# Patient Record
Sex: Male | Born: 1945
Health system: Southern US, Community
[De-identification: ages and names within clinical notes are randomized; demographics above are authoritative.]

## PROBLEM LIST (undated history)

## (undated) DIAGNOSIS — I10 Essential (primary) hypertension: Secondary | ICD-10-CM

## (undated) DIAGNOSIS — R7611 Nonspecific reaction to tuberculin skin test without active tuberculosis: Secondary | ICD-10-CM

## (undated) DIAGNOSIS — K219 Gastro-esophageal reflux disease without esophagitis: Secondary | ICD-10-CM

## (undated) DIAGNOSIS — G20A1 Parkinson's disease without dyskinesia, without mention of fluctuations: Secondary | ICD-10-CM

## (undated) DIAGNOSIS — G20 Parkinson's disease: Secondary | ICD-10-CM

## (undated) DIAGNOSIS — K759 Inflammatory liver disease, unspecified: Secondary | ICD-10-CM

## (undated) DIAGNOSIS — I639 Cerebral infarction, unspecified: Secondary | ICD-10-CM

## (undated) DIAGNOSIS — E119 Type 2 diabetes mellitus without complications: Secondary | ICD-10-CM

## (undated) DIAGNOSIS — M1990 Unspecified osteoarthritis, unspecified site: Secondary | ICD-10-CM

## (undated) HISTORY — DX: Parkinson's disease: G20

## (undated) HISTORY — DX: Parkinson's disease without dyskinesia, without mention of fluctuations: G20.A1

## (undated) HISTORY — DX: Gastro-esophageal reflux disease without esophagitis: K21.9

## (undated) HISTORY — DX: Cerebral infarction, unspecified: I63.9

## (undated) HISTORY — PX: CHOLECYSTECTOMY: SHX55

## (undated) HISTORY — DX: Inflammatory liver disease, unspecified: K75.9

## (undated) HISTORY — PX: OTHER SURGICAL HISTORY: SHX169

## (undated) HISTORY — PX: HAND SURGERY: SHX662

## (undated) HISTORY — DX: Nonspecific reaction to tuberculin skin test without active tuberculosis: R76.11

---

## 2011-08-10 DIAGNOSIS — I639 Cerebral infarction, unspecified: Secondary | ICD-10-CM

## 2011-08-10 HISTORY — DX: Cerebral infarction, unspecified: I63.9

## 2011-08-10 LAB — HM COLONOSCOPY

## 2014-08-12 ENCOUNTER — Emergency Department (HOSPITAL_COMMUNITY)
Admission: EM | Admit: 2014-08-12 | Discharge: 2014-08-12 | Disposition: A | Discharge status: Home / Self Care | Payer: Medicare Other | Attending: Emergency Medicine | Admitting: Emergency Medicine

## 2014-08-12 ENCOUNTER — Encounter (HOSPITAL_COMMUNITY): Payer: Self-pay | Admitting: *Deleted

## 2014-08-12 DIAGNOSIS — Z79899 Other long term (current) drug therapy: Secondary | ICD-10-CM | POA: Diagnosis not present

## 2014-08-12 DIAGNOSIS — Z87891 Personal history of nicotine dependence: Secondary | ICD-10-CM | POA: Insufficient documentation

## 2014-08-12 DIAGNOSIS — Y9389 Activity, other specified: Secondary | ICD-10-CM | POA: Insufficient documentation

## 2014-08-12 DIAGNOSIS — X58XXXA Exposure to other specified factors, initial encounter: Secondary | ICD-10-CM | POA: Diagnosis not present

## 2014-08-12 DIAGNOSIS — Y9289 Other specified places as the place of occurrence of the external cause: Secondary | ICD-10-CM | POA: Diagnosis not present

## 2014-08-12 DIAGNOSIS — Y998 Other external cause status: Secondary | ICD-10-CM | POA: Insufficient documentation

## 2014-08-12 DIAGNOSIS — M1990 Unspecified osteoarthritis, unspecified site: Secondary | ICD-10-CM | POA: Diagnosis not present

## 2014-08-12 DIAGNOSIS — Z7982 Long term (current) use of aspirin: Secondary | ICD-10-CM | POA: Diagnosis not present

## 2014-08-12 DIAGNOSIS — S20319A Abrasion of unspecified front wall of thorax, initial encounter: Secondary | ICD-10-CM | POA: Insufficient documentation

## 2014-08-12 DIAGNOSIS — I10 Essential (primary) hypertension: Secondary | ICD-10-CM | POA: Insufficient documentation

## 2014-08-12 DIAGNOSIS — L298 Other pruritus: Secondary | ICD-10-CM | POA: Diagnosis present

## 2014-08-12 DIAGNOSIS — T148XXA Other injury of unspecified body region, initial encounter: Secondary | ICD-10-CM

## 2014-08-12 HISTORY — DX: Unspecified osteoarthritis, unspecified site: M19.90

## 2014-08-12 HISTORY — DX: Essential (primary) hypertension: I10

## 2014-08-12 NOTE — ED Notes (Signed)
Pt. Had rotator cuff surgery on Tuesday and Saturday afternoon pt. Shoulder began to itch.

## 2014-08-12 NOTE — ED Notes (Signed)
Pt. Refused wheelchair and left with all belongings 

## 2014-08-12 NOTE — ED Provider Notes (Signed)
CSN: [id number]     Arrival date & time 08/12/14  0327 History   First MD Initiated Contact with Patient 08/12/14 [id number]     Chief Complaint  Patient presents with  . Pruritis     (Consider location/radiation/quality/duration/timing/severity/associated sxs/prior Treatment) The history is provided by the patient.  Itching from sling from arthroscopy of the right shoulder.  Wearing strap too tight per wife and got a blister now refusing to wear it.  No f/c/r.  No cp sob n/v/d.    Past Medical History  Diagnosis Date  . Arthritis   . Hypertension    Past Surgical History  Procedure Laterality Date  . Hand surgery    . Cholecystectomy    . Rotator cuff surgery     History reviewed. No pertinent family history. History  Substance Use Topics  . Smoking status: Former Smoker    Quit date: 08/09/1990  . Smokeless tobacco: Never Used  . Alcohol Use: No    Review of Systems  Constitutional: Negative for fever.  Respiratory: Negative for shortness of breath.   Cardiovascular: Negative for chest pain.  Skin: Negative for pallor and rash.  All other systems reviewed and are negative.     Allergies  Omeprazole and Nsaids  Home Medications   Prior to Admission medications   Medication Sig Start Date End Date Taking? Authorizing Provider  amLODipine (NORVASC) 5 MG tablet Take 5 mg by mouth daily.   Yes Historical Provider, MD  aspirin 81 MG chewable tablet Chew 81 mg by mouth daily.   Yes Historical Provider, MD  atorvastatin (LIPITOR) 80 MG tablet Take 80 mg by mouth daily.   Yes Historical Provider, MD  diazepam (VALIUM) 5 MG tablet Take 5 mg by mouth every 6 (six) hours as needed for muscle spasms.   Yes Historical Provider, MD  enalapril (VASOTEC) 10 MG tablet Take 10 mg by mouth 2 (two) times daily.   Yes Historical Provider, MD  Misc Natural Products (PROSTATE HEALTH PO) Take 1 tablet by mouth daily.   Yes Historical Provider, MD  Multiple Vitamin (MULTIVITAMIN WITH  MINERALS) TABS tablet Take 1 tablet by mouth daily.   Yes Historical Provider, MD  oxyCODONE (OXY IR/ROXICODONE) 5 MG immediate release tablet Take 5 mg by mouth every 4 (four) hours as needed for severe pain.   Yes Historical Provider, MD  oxyCODONE-acetaminophen (PERCOCET/ROXICET) 5-325 MG per tablet Take 1 tablet by mouth every 4 (four) hours as needed for moderate pain or severe pain.   Yes Historical Provider, MD  tamsulosin (FLOMAX) 0.4 MG CAPS capsule Take 0.4 mg by mouth daily.   Yes Historical Provider, MD   BP 135/86 mmHg  Pulse 67  Temp(Src) 98.3 F (36.8 C) (Oral)  Resp 18  Ht 5\' 9"  (1.753 m)  Wt 189 lb (85.73 kg)  BMI 27.90 kg/m2  SpO2 99% Physical Exam  Constitutional: He is oriented to person, place, and time. He appears well-developed and well-nourished. No distress.  HENT:  Head: Normocephalic and atraumatic.  Mouth/Throat: Oropharynx is clear and moist.  Eyes: Conjunctivae are normal. Pupils are equal, round, and reactive to light.  Neck: Normal range of motion. Neck supple.  Cardiovascular: Normal rate, regular rhythm and intact distal pulses.   Pulmonary/Chest: Effort normal. He has no wheezes. He has no rales.  Abdominal: Soft. Bowel sounds are normal. There is no tenderness. There is no rebound and no guarding.  Musculoskeletal: Normal range of motion.  Neurological: He is alert and oriented to  person, place, and time.  Skin: Skin is warm and dry.  Isolated blister to RU chest and abrasion in distribution of sling strap.  No warmth or erythema  Psychiatric: He has a normal mood and affect.    ED Course  Procedures (including critical care time) Labs Review Labs Reviewed - No data to display  Imaging Review No results found.   EKG Interpretation None      MDM   Final diagnoses:  Abrasion    Wound care and counseling.  Follow up orthopedics.  Wound care instructions given    Ulisses Vondrak K Delvis Kau-Rasch, MD 08/12/14 [phone]

## 2015-04-10 DIAGNOSIS — E611 Iron deficiency: Secondary | ICD-10-CM | POA: Insufficient documentation

## 2015-04-10 LAB — LIPID PANEL
Cholesterol: 100 mg/dL (ref 0–200)
HDL: 53 mg/dL (ref 35–70)
LDL Cholesterol: 37 mg/dL
Triglycerides: 48 mg/dL (ref 40–160)

## 2015-04-10 LAB — PSA: PSA: 1.19

## 2015-04-10 LAB — HEMOGLOBIN A1C: HEMOGLOBIN A1C: 6.9 % — AB (ref 4.0–6.0)

## 2015-05-06 ENCOUNTER — Ambulatory Visit (INDEPENDENT_AMBULATORY_CARE_PROVIDER_SITE_OTHER): Payer: Commercial Managed Care - HMO | Admitting: Internal Medicine

## 2015-05-06 ENCOUNTER — Encounter (INDEPENDENT_AMBULATORY_CARE_PROVIDER_SITE_OTHER): Payer: Self-pay

## 2015-05-06 ENCOUNTER — Encounter: Payer: Self-pay | Admitting: Internal Medicine

## 2015-05-06 VITALS — BP 132/68 | HR 77 | Temp 98.7°F | Ht 67.5 in | Wt 191.0 lb

## 2015-05-06 DIAGNOSIS — I10 Essential (primary) hypertension: Secondary | ICD-10-CM | POA: Diagnosis not present

## 2015-05-06 DIAGNOSIS — G4700 Insomnia, unspecified: Secondary | ICD-10-CM

## 2015-05-06 DIAGNOSIS — I639 Cerebral infarction, unspecified: Secondary | ICD-10-CM | POA: Insufficient documentation

## 2015-05-06 DIAGNOSIS — M549 Dorsalgia, unspecified: Secondary | ICD-10-CM

## 2015-05-06 DIAGNOSIS — E119 Type 2 diabetes mellitus without complications: Secondary | ICD-10-CM

## 2015-05-06 DIAGNOSIS — N400 Enlarged prostate without lower urinary tract symptoms: Secondary | ICD-10-CM

## 2015-05-06 DIAGNOSIS — Z8611 Personal history of tuberculosis: Secondary | ICD-10-CM | POA: Insufficient documentation

## 2015-05-06 DIAGNOSIS — H8110 Benign paroxysmal vertigo, unspecified ear: Secondary | ICD-10-CM

## 2015-05-06 DIAGNOSIS — G8929 Other chronic pain: Secondary | ICD-10-CM

## 2015-05-06 DIAGNOSIS — E785 Hyperlipidemia, unspecified: Secondary | ICD-10-CM

## 2015-05-06 DIAGNOSIS — K219 Gastro-esophageal reflux disease without esophagitis: Secondary | ICD-10-CM

## 2015-05-06 MED ORDER — TRAZODONE HCL 50 MG PO TABS: 25.0000 mg | ORAL_TABLET | Freq: Every evening | ORAL | Status: DC | PRN

## 2015-05-06 NOTE — Progress Notes (Signed)
HPI  Pt presents to the clinic today to establish care and for management of the conditions listed below. He is transferring care from the [facility].  HTN: BP well controlled. He takes Norvasc, Enalapril and HCTZ. His BP today is 132/68.He has had an ECG in the past.  Chronic low back pain. This has been going on since his 20's. He has had xrays and MRI's. He was told at one point that he had spine bifida but he reports he does not think he has this. He takes oxycodone for severe pain. He does follow with pain management.  DM 2: His last A1C was 6.9%. He does not take any medication for his diabetes. He reports he has never been told he has diabetes. He does not check his sugars. His last eye exam was 04/2014. Flu shot 04/2015. Pneumovax and Prevnar UTD. He does not check his feet daily.  Stroke: His last LDL was 37. He takes Lipitor daily. He denies myalgias. He does try to consume a low fat diet. He takes a baby ASA daily.  BPH: Voids fine on Flomax.  GERD: Denies breakthrough symptoms on Zantac.  History of TB: as a child, no reoccurrence. He denies fatigue, cough, shortness of breath or night sweats.  BPPV: He still has intermittent dizziness. It is worse with head movements. He does not want to take a medication for this but wants to know if there is any other treatments available.   He also c/o difficulty sleeping. This has been going on for years. It seems to have gotten worse in the last 2 weeks since his mother died. He was not able to attend her funeral. He reports he has difficulty falling asleep but once he is asleep, he can usually stay asleep. He has tried Ambien and Trazadone in the past.  Flu: 04/2015 Tetanus: unsure of exact date but < 10 years ago Prevnar: 2016 Pneumovax: 2015 Zostovax: 2016 PSA Screening: 04/2015 Colon Screening: 2011 Vision Screening: yearly, 04/2014 Dentist: as needed  Past Medical History  Diagnosis Date  . Arthritis   . Hypertension   . Hepatitis    . Stroke 2013  . Positive TB test     as a child    Current Outpatient Prescriptions  Medication Sig Dispense Refill  . amLODipine (NORVASC) 5 MG tablet Take 5 mg by mouth daily.    Marland Kitchen aspirin 81 MG chewable tablet Chew 81 mg by mouth daily.    Marland Kitchen atorvastatin (LIPITOR) 80 MG tablet Take 80 mg by mouth daily.    . enalapril (VASOTEC) 10 MG tablet Take 10 mg by mouth 2 (two) times daily.    . Misc Natural Products (PROSTATE HEALTH PO) Take 1 tablet by mouth daily.    . Multiple Vitamin (MULTIVITAMIN WITH MINERALS) TABS tablet Take 1 tablet by mouth daily.    Marland Kitchen oxyCODONE (OXY IR/ROXICODONE) 5 MG immediate release tablet Take 5 mg by mouth every 4 (four) hours as needed for severe pain.    . tamsulosin (FLOMAX) 0.4 MG CAPS capsule Take 0.4 mg by mouth daily.    . hydrochlorothiazide (HYDRODIURIL) 25 MG tablet Take 1 tablet by mouth daily.    [name] 3-Lutein-Zeaxanthin (ADVANCED EYE HEALTH PO) Take 1 tablet by mouth daily.    . ranitidine (ZANTAC) 150 MG tablet Take 1 tablet by mouth 2 (two) times daily.     No current facility-administered medications for this visit.    Allergies  Allergen Reactions  . Omeprazole Anaphylaxis  .  Nsaids Nausea And Vomiting    Family History  Problem Relation Age of Onset  . Heart disease Mother   . Hypertension Mother     Social History   Social History  . Marital Status: Married    Spouse Name: N/A  . Number of Children: N/A  . Years of Education: N/A   Occupational History  . Not on file.   Social History Main Topics  . Smoking status: Former Smoker    Quit date: 08/09/1990  . Smokeless tobacco: Never Used  . Alcohol Use: No  . Drug Use: No  . Sexual Activity: Yes   Other Topics Concern  . Not on file   Social History Narrative    ROS:  Constitutional: Denies fever, malaise, fatigue, headache or abrupt weight changes.  HEENT: Denies eye pain, eye redness, ear pain, ringing in the ears, wax buildup, runny nose, nasal  congestion, bloody nose, or sore throat. Respiratory: Denies difficulty breathing, shortness of breath, cough or sputum production.   Cardiovascular: Denies chest pain, chest tightness, palpitations or swelling in the hands or feet.  Gastrointestinal: Denies abdominal pain, bloating, constipation, diarrhea or blood in the stool.  GU: Denies frequency, urgency, pain with urination, blood in urine, odor or discharge. Musculoskeletal: Pt reports back pain. Denies  difficulty with gait, muscle pain or joint pain and swelling.  Skin: Denies redness, rashes, lesions or ulcercations.  Neurological: Denies dizziness, difficulty with memory, difficulty with speech or problems with balance and coordination.  Psych: Denies anxiety, depression, SI/HI.  No other specific complaints in a complete review of systems (except as listed in HPI above).  PE:  BP 132/68 mmHg  Pulse 77  Temp(Src) 98.7 F (37.1 C) (Oral)  Ht 5' 7.5" (1.715 m)  Wt 191 lb (86.637 kg)  BMI 29.46 kg/m2  SpO2 98%  Wt Readings from Last 3 Encounters:  05/06/15 191 lb (86.637 kg)  08/12/14 189 lb (85.73 kg)    General: Appears his stated age, well developed, well nourished in NAD. Skin: Warm, dry and intact. HEENT: Head: normal shape and size; Eyes: sclera white, no icterus, conjunctiva pink, PERRLA and EOMs intact;  Neck: Neck supple, trachea midline. No masses, lumps or thyromegaly present.  Cardiovascular: Normal rate and rhythm. S1,S2 noted.  No murmur, rubs or gallops noted. No JVD or BLE edema. No carotid bruits noted. Pulmonary/Chest: Normal effort and positive vesicular breath sounds. No respiratory distress. No wheezes, rales or ronchi noted.  Abdomen: Soft and nontender.  Musculoskeletal: Decreased flexion and extension due to pain. Normal rotation. Pain with palpation over the lumbar spine. No difficulty with gait.  Neurological: Alert and oriented. Psychiatric: Mood and affect normal. Behavior is normal. Judgment  and thought content normal.    Assessment and Plan:

## 2015-05-06 NOTE — Assessment & Plan Note (Signed)
LDL at goal on Lipitor Advised him to consume a low fat diet

## 2015-05-06 NOTE — Assessment & Plan Note (Signed)
Stop Benadryl RX for Trazadone QHS prn

## 2015-05-06 NOTE — Assessment & Plan Note (Signed)
Continue Zantac Avoid trigger foods

## 2015-05-06 NOTE — Assessment & Plan Note (Signed)
Referral placed to vestibular rehab

## 2015-05-06 NOTE — Progress Notes (Signed)
Pre visit review using our clinic review tool, if applicable. No additional management support is needed unless otherwise documented below in the visit note. 

## 2015-05-06 NOTE — Assessment & Plan Note (Signed)
BP well controlled Continue Norvasc, Enalapril and HCTZ Will request ECG from previous PCP

## 2015-05-06 NOTE — Assessment & Plan Note (Signed)
He denies that he has this He declines treatment for this

## 2015-05-06 NOTE — Assessment & Plan Note (Signed)
No intervention needed Will continue to monitor

## 2015-05-06 NOTE — Assessment & Plan Note (Signed)
LDL at goal on Lipitor Continue ASA daily Will continue to monitor

## 2015-05-06 NOTE — Assessment & Plan Note (Signed)
He will continue to follow with pain management  

## 2015-05-06 NOTE — Patient Instructions (Signed)
Insomnia Insomnia is frequent trouble falling and/or staying asleep. Insomnia can be a long term problem or a short term problem. Both are common. Insomnia can be a short term problem when the wakefulness is related to a certain stress or worry. Long term insomnia is often related to ongoing stress during waking hours and/or poor sleeping habits. Overtime, sleep deprivation itself can make the problem worse. Every little thing feels more severe because you are overtired and your ability to cope is decreased. CAUSES   Stress, anxiety, and depression.  Poor sleeping habits.  Distractions such as TV in the bedroom.  Naps close to bedtime.  Engaging in emotionally charged conversations before bed.  Technical reading before sleep.  Alcohol and other sedatives. They may make the problem worse. They can hurt normal sleep patterns and normal dream activity.  Stimulants such as caffeine for several hours prior to bedtime.  Pain syndromes and shortness of breath can cause insomnia.  Exercise late at night.  Changing time zones may cause sleeping problems (jet lag). It is sometimes helpful to have someone observe your sleeping patterns. They should look for periods of not breathing during the night (sleep apnea). They should also look to see how long those periods last. If you live alone or observers are uncertain, you can also be observed at a sleep clinic where your sleep patterns will be professionally monitored. Sleep apnea requires a checkup and treatment. Give your caregivers your medical history. Give your caregivers observations your family has made about your sleep.  SYMPTOMS   Not feeling rested in the morning.  Anxiety and restlessness at bedtime.  Difficulty falling and staying asleep. TREATMENT   Your caregiver may prescribe treatment for an underlying medical disorders. Your caregiver can give advice or help if you are using alcohol or other drugs for self-medication. Treatment  of underlying problems will usually eliminate insomnia problems.  Medications can be prescribed for short time use. They are generally not recommended for lengthy use.  Over-the-counter sleep medicines are not recommended for lengthy use. They can be habit forming.  You can promote easier sleeping by making lifestyle changes such as:  Using relaxation techniques that help with breathing and reduce muscle tension.  Exercising earlier in the day.  Changing your diet and the time of your last meal. No night time snacks.  Establish a regular time to go to bed.  Counseling can help with stressful problems and worry.  Soothing music and white noise may be helpful if there are background noises you cannot remove.  Stop tedious detailed work at least one hour before bedtime. HOME CARE INSTRUCTIONS   Keep a diary. Inform your caregiver about your progress. This includes any medication side effects. See your caregiver regularly. Take note of:  Times when you are asleep.  Times when you are awake during the night.  The quality of your sleep.  How you feel the next day. This information will help your caregiver care for you.  Get out of bed if you are still awake after 15 minutes. Read or do some quiet activity. Keep the lights down. Wait until you feel sleepy and go back to bed.  Keep regular sleeping and waking hours. Avoid naps.  Exercise regularly.  Avoid distractions at bedtime. Distractions include watching television or engaging in any intense or detailed activity like attempting to balance the household checkbook.  Develop a bedtime ritual. Keep a familiar routine of bathing, brushing your teeth, climbing into bed at the same   time each night, listening to soothing music. Routines increase the success of falling to sleep faster.  Use relaxation techniques. This can be using breathing and muscle tension release routines. It can also include visualizing peaceful scenes. You can  also help control troubling or intruding thoughts by keeping your mind occupied with boring or repetitive thoughts like the old concept of counting sheep. You can make it more creative like imagining planting one beautiful flower after another in your backyard garden.  During your day, work to eliminate stress. When this is not possible use some of the previous suggestions to help reduce the anxiety that accompanies stressful situations. MAKE SURE YOU:   Understand these instructions.  Will watch your condition.  Will get help right away if you are not doing well or get worse. Document Released: 07/23/2000 Document Revised: 10/18/2011 Document Reviewed: 08/23/2007 Exit[employer] Patient Information 2015 [employer]. This information is not intended to replace advice given to you by your health care provider. Make sure you discuss any questions you have with your health care provider.  

## 2015-05-06 NOTE — Assessment & Plan Note (Signed)
-   Continue Flomax 

## 2015-05-16 ENCOUNTER — Telehealth: Payer: Self-pay

## 2015-05-16 ENCOUNTER — Telehealth: Payer: Self-pay | Admitting: Internal Medicine

## 2015-05-16 NOTE — Telephone Encounter (Signed)
noted 

## 2015-05-16 NOTE — Telephone Encounter (Signed)
Pt notified as instructed and pt voiced understanding. 

## 2015-05-16 NOTE — Telephone Encounter (Signed)
This is something we order during a physical/wellness. I would recommend [facility] for [facility], and we can have his next colonoscopy (if needed) done somewhere closer.

## 2015-05-16 NOTE — Telephone Encounter (Signed)
Pt seen 05/06/15 and has been taking Trazodone 50 mg at hs and that is working well; advised pt instructions are to take 25 mg; pt said will take 1/2 tab at hs and cb with how sleeping taking 1/2 tab. FYI to Webb Silversmith NP.

## 2015-05-16 NOTE — Telephone Encounter (Signed)
Pt called wanting to see if we could do a colonoscopy referral for him. He does have something set up with the [place] in [facility] on October 25th, but wanted to know if we could get him in somewhere closer. Pt states his last procedure was done with the [place] in [place]. His call back number is [phone]. Thank you.

## 2015-06-03 DIAGNOSIS — Z86010 Personal history of colonic polyps: Secondary | ICD-10-CM | POA: Diagnosis not present

## 2015-06-03 DIAGNOSIS — K5900 Constipation, unspecified: Secondary | ICD-10-CM | POA: Diagnosis not present

## 2015-06-11 ENCOUNTER — Encounter: Payer: Self-pay | Admitting: Internal Medicine

## 2015-06-12 DIAGNOSIS — Z1211 Encounter for screening for malignant neoplasm of colon: Secondary | ICD-10-CM | POA: Diagnosis not present

## 2015-06-12 DIAGNOSIS — D122 Benign neoplasm of ascending colon: Secondary | ICD-10-CM | POA: Diagnosis not present

## 2015-06-12 DIAGNOSIS — D126 Benign neoplasm of colon, unspecified: Secondary | ICD-10-CM | POA: Diagnosis not present

## 2015-06-12 DIAGNOSIS — D123 Benign neoplasm of transverse colon: Secondary | ICD-10-CM | POA: Diagnosis not present

## 2015-06-12 DIAGNOSIS — Z86010 Personal history of colonic polyps: Secondary | ICD-10-CM | POA: Diagnosis not present

## 2015-06-12 LAB — HM COLONOSCOPY

## 2015-06-13 ENCOUNTER — Other Ambulatory Visit: Payer: Self-pay | Admitting: Internal Medicine

## 2015-06-13 MED ORDER — RANITIDINE HCL 150 MG PO TABS: 150.0000 mg | ORAL_TABLET | Freq: Two times a day (BID) | ORAL | Status: DC

## 2015-06-13 MED ORDER — ATORVASTATIN CALCIUM 80 MG PO TABS: 80.0000 mg | ORAL_TABLET | Freq: Every day | ORAL | Status: DC

## 2015-06-13 MED ORDER — TAMSULOSIN HCL 0.4 MG PO CAPS: 0.4000 mg | ORAL_CAPSULE | Freq: Every day | ORAL | Status: DC

## 2015-06-13 NOTE — Telephone Encounter (Signed)
Pt request refill 1 week of med to [facility] while wait on mail order pharmacy delivery for atorvastatin,ranitidine and tamsulosin.advised pt done. Pt last seen 05/06/15.

## 2015-07-02 ENCOUNTER — Other Ambulatory Visit: Payer: Self-pay

## 2015-07-02 MED ORDER — TRAZODONE HCL 50 MG PO TABS: 25.0000 mg | ORAL_TABLET | Freq: Every evening | ORAL | Status: DC | PRN

## 2015-09-19 ENCOUNTER — Encounter: Payer: Self-pay | Admitting: Internal Medicine

## 2015-09-23 ENCOUNTER — Other Ambulatory Visit: Payer: Self-pay | Admitting: *Deleted

## 2015-09-23 MED ORDER — TRAZODONE HCL 50 MG PO TABS: 25.0000 mg | ORAL_TABLET | Freq: Every evening | ORAL | Status: DC | PRN

## 2015-09-23 NOTE — Telephone Encounter (Signed)
Last filled #45 on 07/02/15.  Last OV was to establish care on 05/06/15.  Okay to refill?

## 2015-09-23 NOTE — Telephone Encounter (Signed)
Ok to phone in Trazadone 

## 2015-09-30 ENCOUNTER — Other Ambulatory Visit: Payer: Self-pay | Admitting: Internal Medicine

## 2015-10-08 ENCOUNTER — Telehealth: Payer: Self-pay

## 2015-10-08 NOTE — Telephone Encounter (Signed)
James Pearson stop by today to check on his prescription traZODone (DESYREL) 50 MG tablet, He stated that [place] had not received it. [place] is completely out now. Please check on this for him and let him know. [phone] He also stated that if you wanted to call in to [facility] would be okay with him, because he is having so much trouble with [employer].

## 2015-10-09 MED ORDER — TRAZODONE HCL 50 MG PO TABS: 25.0000 mg | ORAL_TABLET | Freq: Every evening | ORAL | Status: DC | PRN

## 2015-10-09 NOTE — Addendum Note (Signed)
Addended by: Lurlean Nanny on: 10/09/2015 04:51 PM   Modules accepted: Orders

## 2015-10-09 NOTE — Telephone Encounter (Signed)
Rx sent to pharmacy as it was set to phone in previously and pt is aware

## 2015-11-03 ENCOUNTER — Ambulatory Visit: Payer: Commercial Managed Care - HMO | Admitting: Internal Medicine

## 2015-11-06 ENCOUNTER — Ambulatory Visit (INDEPENDENT_AMBULATORY_CARE_PROVIDER_SITE_OTHER): Payer: Commercial Managed Care - HMO | Admitting: Internal Medicine

## 2015-11-06 ENCOUNTER — Encounter: Payer: Self-pay | Admitting: Internal Medicine

## 2015-11-06 ENCOUNTER — Ambulatory Visit: Payer: Commercial Managed Care - HMO | Admitting: Internal Medicine

## 2015-11-06 VITALS — BP 142/80 | HR 81 | Temp 97.5°F | Wt 189.0 lb

## 2015-11-06 DIAGNOSIS — I10 Essential (primary) hypertension: Secondary | ICD-10-CM

## 2015-11-06 DIAGNOSIS — E119 Type 2 diabetes mellitus without complications: Secondary | ICD-10-CM

## 2015-11-06 DIAGNOSIS — K219 Gastro-esophageal reflux disease without esophagitis: Secondary | ICD-10-CM

## 2015-11-06 DIAGNOSIS — E785 Hyperlipidemia, unspecified: Secondary | ICD-10-CM

## 2015-11-06 DIAGNOSIS — G4700 Insomnia, unspecified: Secondary | ICD-10-CM

## 2015-11-06 DIAGNOSIS — N400 Enlarged prostate without lower urinary tract symptoms: Secondary | ICD-10-CM

## 2015-11-06 DIAGNOSIS — I639 Cerebral infarction, unspecified: Secondary | ICD-10-CM | POA: Diagnosis not present

## 2015-11-06 DIAGNOSIS — G8929 Other chronic pain: Secondary | ICD-10-CM

## 2015-11-06 DIAGNOSIS — M549 Dorsalgia, unspecified: Secondary | ICD-10-CM

## 2015-11-06 NOTE — Progress Notes (Signed)
HPI  Pt presents to the clinic today for follow up of chronic conditions:  HTN: BP well controlled. He takes Norvasc, Enalapril and HCTZ. His BP today is 142/80. He thinks it is because he was rushing to get here and was late. He has had an ECG in the past.  Chronic low back pain. He does take Oxycodone as needed for severe pain. He follows with pain management.  DM 2: His last A1C was 6.9%. He does not take any medication for his diabetes. He does not check his sugars. His last eye exam was 04/2015. Flu shot 04/2015. Pneumovax and Prevnar UTD. He does not check his feet daily.  Stroke: His last LDL was 37. He takes Lipitor daily. He denies myalgias. He does try to consume a low fat diet. He takes a baby ASA daily.  BPH: Voids fine on Flomax.  GERD: Denies breakthrough symptoms on Zantac.  Insomnia. He reports he has difficulty falling asleep but once he is asleep, he can usually stay asleep. He is taking 1/2 tablet of Trazadone but feels like he would sleep better if he could take a whole tablet.   Past Medical History  Diagnosis Date  . Arthritis   . Hypertension   . Hepatitis   . Stroke [facility]) 2013  . Positive TB test     as a child    Current Outpatient Prescriptions  Medication Sig Dispense Refill  . amLODipine (NORVASC) 5 MG tablet Take 5 mg by mouth daily.    Marland Kitchen aspirin 81 MG chewable tablet Chew 81 mg by mouth daily.    Marland Kitchen atorvastatin (LIPITOR) 80 MG tablet Take 1 tablet (80 mg total) by mouth daily. 7 tablet 0  . enalapril (VASOTEC) 10 MG tablet Take 10 mg by mouth 2 (two) times daily.    . hydrochlorothiazide (HYDRODIURIL) 25 MG tablet Take 1 tablet by mouth daily.    . Misc Natural Products (PROSTATE HEALTH PO) Take 1 tablet by mouth daily.    . Multiple Vitamin (MULTIVITAMIN WITH MINERALS) TABS tablet Take 1 tablet by mouth daily.    James Pearson 3-Lutein-Zeaxanthin (ADVANCED EYE HEALTH PO) Take 1 tablet by mouth daily.    Marland Kitchen OVER THE COUNTER MEDICATION Take 3 capsules by  mouth daily. Testropin supplement    . oxyCODONE (OXY IR/ROXICODONE) 5 MG immediate release tablet Take 5 mg by mouth every 4 (four) hours as needed for severe pain.    . ranitidine (ZANTAC) 150 MG tablet Take 1 tablet (150 mg total) by mouth 2 (two) times daily. 14 tablet 0  . Saw Palmetto-Pumpkin Seed Oil 160-100 MG CAPS Take 2 capsules by mouth daily.    . sildenafil (VIAGRA) 100 MG tablet Take 50-100 mg by mouth daily as needed for erectile dysfunction.    . tamsulosin (FLOMAX) 0.4 MG CAPS capsule Take 1 capsule (0.4 mg total) by mouth daily. 7 capsule 0  . traZODone (DESYREL) 50 MG tablet Take 0.5 tablets (25 mg total) by mouth at bedtime as needed for sleep. 45 tablet 0   No current facility-administered medications for this visit.    Allergies  Allergen Reactions  . Omeprazole Anaphylaxis  . Nsaids Nausea And Vomiting    Family History  Problem Relation Age of Onset  . Heart disease Mother   . Hypertension Mother     Social History   Social History  . Marital Status: Married    Spouse Name: N/A  . Number of Children: N/A  . Years of Education:  N/A   Occupational History  . Not on file.   Social History Main Topics  . Smoking status: Former Smoker    Quit date: 08/09/1990  . Smokeless tobacco: Never Used  . Alcohol Use: No  . Drug Use: No  . Sexual Activity: Yes   Other Topics Concern  . Not on file   Social History Narrative    ROS:  Constitutional: Denies fever, malaise, fatigue, headache or abrupt weight changes.  HEENT: Denies eye pain, eye redness, ear pain, ringing in the ears, wax buildup, runny nose, nasal congestion, bloody nose, or sore throat. Respiratory: Denies difficulty breathing, shortness of breath, cough or sputum production.   Cardiovascular: Denies chest pain, chest tightness, palpitations or swelling in the hands or feet.  Gastrointestinal: Denies abdominal pain, bloating, constipation, diarrhea or blood in the stool.  GU: Denies  frequency, urgency, pain with urination, blood in urine, odor or discharge. Musculoskeletal: Pt reports back pain. Denies  difficulty with gait, muscle pain or joint pain and swelling.  Skin: Denies redness, rashes, lesions or ulcercations.  Neurological: Denies dizziness, difficulty with memory, difficulty with speech or problems with balance and coordination.  Psych: Denies anxiety, depression, SI/HI.  No other specific complaints in a complete review of systems (except as listed in HPI above).  PE:  BP 142/80 mmHg  Pulse 81  Temp(Src) 97.5 F (36.4 C) (Oral)  Wt 189 lb (85.73 kg)  SpO2 98%  Wt Readings from Last 3 Encounters:  11/06/15 189 lb (85.73 kg)  05/06/15 191 lb (86.637 kg)  08/12/14 189 lb (85.73 kg)    General: Appears his stated age, well developed, well nourished in NAD. Skin: Warm, dry and intact. HEENT: Head: normal shape and size; Eyes: sclera white, no icterus, conjunctiva pink, PERRLA and EOMs intact;  Neck: Neck supple, trachea midline. No masses, lumps or thyromegaly present.  Cardiovascular: Normal rate and rhythm. S1,S2 noted.  No murmur, rubs or gallops noted. No JVD or BLE edema. No carotid bruits noted. Pulmonary/Chest: Normal effort and positive vesicular breath sounds. No respiratory distress. No wheezes, rales or ronchi noted.  Abdomen: Soft and nontender.  Musculoskeletal: Decreased flexion and extension due to pain. Normal rotation. Pain with palpation over the lumbar spine. No difficulty with gait.  Neurological: Alert and oriented. Psychiatric: Mood and affect normal. Behavior is normal. Judgment and thought content normal.    Assessment and Plan:

## 2015-11-06 NOTE — Patient Instructions (Signed)

## 2015-11-07 NOTE — Assessment & Plan Note (Signed)
Will check A1C today No microalbumin due to ACEI therapy Encouraged him to get a yearly eye exam Foot exam today Flu, Pneumovax and Prevnar UTD Encouraged him to consume a low fat, low carb diet

## 2015-11-07 NOTE — Assessment & Plan Note (Signed)
Voids well on Flomax

## 2015-11-07 NOTE — Assessment & Plan Note (Signed)
Controlled on Norvasc, Enalapril, and HCTZ CBC and CMET today

## 2015-11-07 NOTE — Assessment & Plan Note (Signed)
Will check CMET and Lipid Profile today Encouraged him to consume a low fat diet Continue Lipitor and baby ASA daily

## 2015-11-07 NOTE — Assessment & Plan Note (Signed)
Will increase Trazadone to a whole tab

## 2015-11-07 NOTE — Assessment & Plan Note (Signed)
Well controlled on Zantac Discussed diet to avoid triggers

## 2015-11-07 NOTE — Assessment & Plan Note (Signed)
He will continue to follow with pain management  

## 2015-11-07 NOTE — Assessment & Plan Note (Signed)
Will check LDL and CMET today Continue Lipitor and baby ASA daily

## 2015-12-10 ENCOUNTER — Other Ambulatory Visit: Payer: Self-pay

## 2015-12-10 MED ORDER — TRAZODONE HCL 50 MG PO TABS: 50.0000 mg | ORAL_TABLET | Freq: Every evening | ORAL | Status: DC | PRN

## 2016-03-09 LAB — HEMOGLOBIN A1C: Hemoglobin A1C: 6.9

## 2016-05-10 ENCOUNTER — Ambulatory Visit: Payer: Commercial Managed Care - HMO | Admitting: Internal Medicine

## 2016-05-10 DIAGNOSIS — Z0289 Encounter for other administrative examinations: Secondary | ICD-10-CM

## 2016-05-10 NOTE — Progress Notes (Deleted)
HPI  Pt presents to the clinic today for follow up of chronic conditions:  HTN: BP well controlled. He takes Norvasc, Enalapril and HCTZ. His BP today is . There is no ECG on file for review.  Chronic low back pain. He does take Oxycodone as needed for severe pain. He follows with pain management.  DM 2: His last A1C was 7.1%, 09/2015. He does not take any medication for his diabetes. He does not check his sugars. His last eye exam was 04/2015. Flu shot 05/2015. Pneumovax 10/2014. Prevnar.James Pearson He does not check his feet daily.  Stroke: His last LDL was 37. He takes Lipitor daily. He denies myalgias. He does try to consume a low fat diet. He takes a baby ASA daily.  BPH: Voids fine on Flomax.  GERD: Denies breakthrough symptoms on Zantac.  Insomnia. Improved with increasing Trazadone to a whole tab.   Past Medical History:  Diagnosis Date  . Arthritis   . Hepatitis   . Hypertension   . 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 Stroke [facility]) 2013    Current Outpatient Prescriptions  Medication Sig Dispense Refill  . amLODipine (NORVASC) 5 MG tablet Take 5 mg by mouth daily.    James Pearson aspirin 81 MG chewable tablet Chew 81 mg by mouth daily.    James Pearson atorvastatin (LIPITOR) 80 MG tablet Take 1 tablet (80 mg total) by mouth daily. 7 tablet 0  . enalapril (VASOTEC) 10 MG tablet Take 10 mg by mouth 2 (two) times daily.    . hydrochlorothiazide (HYDRODIURIL) 25 MG tablet Take 1 tablet by mouth daily.    . Misc Natural Products (PROSTATE HEALTH PO) Take 1 tablet by mouth daily.    . Multiple Vitamin (MULTIVITAMIN WITH MINERALS) TABS tablet Take 1 tablet by mouth daily.    James Pearson 3-Lutein-Zeaxanthin (ADVANCED EYE HEALTH PO) Take 1 tablet by mouth daily.    James Pearson OVER THE COUNTER MEDICATION Take 3 capsules by mouth daily. Testropin supplement    . oxyCODONE (OXY IR/ROXICODONE) 5 MG immediate release tablet Take 5 mg by mouth every 4 (four) hours as needed for severe pain.    . ranitidine (ZANTAC) 150 MG tablet  Take 1 tablet (150 mg total) by mouth 2 (two) times daily. 14 tablet 0  . Saw Palmetto-Pumpkin Seed Oil 160-100 MG CAPS Take 2 capsules by mouth daily.    . sildenafil (VIAGRA) 100 MG tablet Take 50-100 mg by mouth daily as needed for erectile dysfunction.    . tamsulosin (FLOMAX) 0.4 MG CAPS capsule Take 1 capsule (0.4 mg total) by mouth daily. 7 capsule 0  . traZODone (DESYREL) 50 MG tablet Take 1 tablet (50 mg total) by mouth at bedtime as needed for sleep. 90 tablet 1   No current facility-administered medications for this visit.     Allergies  Allergen Reactions  . Omeprazole Anaphylaxis  . Nsaids Nausea And Vomiting    Family History  Problem Relation Age of Onset  . Heart disease Mother   . Hypertension Mother     Social History   Social History  . Marital status: Married    Spouse name: N/A  . Number of children: N/A  . Years of education: N/A   Occupational History  . Not on file.   Social History Main Topics  . Smoking status: Former Smoker    Quit date: 08/09/1990  . Smokeless tobacco: Never Used  . Alcohol use No  . Drug use: No  .  Sexual activity: Yes   Other Topics Concern  . Not on file   Social History Narrative  . No narrative on file    ROS:  Constitutional: Denies fever, malaise, fatigue, headache or abrupt weight changes.  HEENT: Denies eye pain, eye redness, ear pain, ringing in the ears, wax buildup, runny nose, nasal congestion, bloody nose, or sore throat. Respiratory: Denies difficulty breathing, shortness of breath, cough or sputum production.   Cardiovascular: Denies chest pain, chest tightness, palpitations or swelling in the hands or feet.  Gastrointestinal: Denies abdominal pain, bloating, constipation, diarrhea or blood in the stool.  GU: Denies frequency, urgency, pain with urination, blood in urine, odor or discharge. Musculoskeletal: Pt reports back pain. Denies  difficulty with gait, muscle pain or joint pain and swelling.   Skin: Denies redness, rashes, lesions or ulcercations.  Neurological: Denies dizziness, difficulty with memory, difficulty with speech or problems with balance and coordination.  Psych: Denies anxiety, depression, SI/HI.  No other specific complaints in a complete review of systems (except as listed in HPI above).  PE:  There were no vitals taken for this visit.  Wt Readings from Last 3 Encounters:  11/06/15 189 lb (85.7 kg)  05/06/15 191 lb (86.6 kg)  08/12/14 189 lb (85.7 kg)    General: Appears his stated age, well developed, well nourished in NAD. Skin: Warm, dry and intact. HEENT: Head: normal shape and size; Eyes: sclera white, no icterus, conjunctiva pink, PERRLA and EOMs intact;  Neck: Neck supple, trachea midline. No masses, lumps or thyromegaly present.  Cardiovascular: Normal rate and rhythm. S1,S2 noted.  No murmur, rubs or gallops noted. No JVD or BLE edema. No carotid bruits noted. Pulmonary/Chest: Normal effort and positive vesicular breath sounds. No respiratory distress. No wheezes, rales or ronchi noted.  Abdomen: Soft and nontender.  Musculoskeletal: Decreased flexion and extension due to pain. Normal rotation. Pain with palpation over the lumbar spine. No difficulty with gait.  Neurological: Alert and oriented. Psychiatric: Mood and affect normal. Behavior is normal. Judgment and thought content normal.    Assessment and Plan:

## 2016-05-12 ENCOUNTER — Ambulatory Visit: Payer: Commercial Managed Care - HMO | Admitting: Internal Medicine

## 2016-05-14 ENCOUNTER — Encounter: Payer: Self-pay | Admitting: Internal Medicine

## 2016-05-14 ENCOUNTER — Ambulatory Visit (INDEPENDENT_AMBULATORY_CARE_PROVIDER_SITE_OTHER): Payer: Commercial Managed Care - HMO | Admitting: Internal Medicine

## 2016-05-14 DIAGNOSIS — I10 Essential (primary) hypertension: Secondary | ICD-10-CM | POA: Diagnosis not present

## 2016-05-14 DIAGNOSIS — E119 Type 2 diabetes mellitus without complications: Secondary | ICD-10-CM | POA: Diagnosis not present

## 2016-05-14 DIAGNOSIS — M545 Low back pain: Secondary | ICD-10-CM

## 2016-05-14 DIAGNOSIS — K219 Gastro-esophageal reflux disease without esophagitis: Secondary | ICD-10-CM | POA: Diagnosis not present

## 2016-05-14 DIAGNOSIS — I639 Cerebral infarction, unspecified: Secondary | ICD-10-CM

## 2016-05-14 DIAGNOSIS — G8929 Other chronic pain: Secondary | ICD-10-CM

## 2016-05-14 DIAGNOSIS — E7800 Pure hypercholesterolemia, unspecified: Secondary | ICD-10-CM

## 2016-05-14 DIAGNOSIS — G4700 Insomnia, unspecified: Secondary | ICD-10-CM

## 2016-05-14 DIAGNOSIS — N400 Enlarged prostate without lower urinary tract symptoms: Secondary | ICD-10-CM

## 2016-05-14 NOTE — Progress Notes (Signed)
HPI  Pt presents to the clinic today for follow up of chronic conditions:  HTN: BP well controlled. He takes Norvasc, Enalapril and HCTZ. His BP today is 122/60. He has had an ECG in the past with his previous provider.  Chronic low back pain. He does take Oxycodone as needed for severe pain. He follows with pain management.  DM 2: His last A1C was 7.1%. He does not take any medication for his diabetes. He does not check his sugars. His last eye exam was 10/2015. Flu shot 05/2016. Pneumovax 10/2014.  Prevnar UTD. He does not check his feet daily.  Stroke: He just had his labs checked at the [place], but he did not bring a copy of the results with him. He takes Lipitor and ASA  daily. He denies myalgias. He does try to consume a low fat diet.   BPH: Voids fine on Flomax.  GERD: Denies breakthrough symptoms on Zantac.  Insomnia. He reports he sleeps well but he has to take 1.5 tablets of the Trazadone.   Past Medical History:  Diagnosis Date  . Arthritis   . Hepatitis   . Hypertension   . 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 Stroke [facility]) 2013    Current Outpatient Prescriptions  Medication Sig Dispense Refill  . amLODipine (NORVASC) 5 MG tablet Take 5 mg by mouth daily.    Marland Kitchen aspirin 81 MG chewable tablet Chew 81 mg by mouth daily.    Marland Kitchen atorvastatin (LIPITOR) 80 MG tablet Take 1 tablet (80 mg total) by mouth daily. 7 tablet 0  . enalapril (VASOTEC) 10 MG tablet Take 10 mg by mouth 2 (two) times daily.    . hydrochlorothiazide (HYDRODIURIL) 25 MG tablet Take 1 tablet by mouth daily.    . Misc Natural Products (PROSTATE HEALTH PO) Take 1 tablet by mouth daily.    . Multiple Vitamin (MULTIVITAMIN WITH MINERALS) TABS tablet Take 1 tablet by mouth daily.    [name] 3-Lutein-Zeaxanthin (ADVANCED EYE HEALTH PO) Take 1 tablet by mouth daily.    Marland Kitchen OVER THE COUNTER MEDICATION Take 3 capsules by mouth daily. Testropin supplement    . oxyCODONE (OXY IR/ROXICODONE) 5 MG immediate release tablet Take 5  mg by mouth every 4 (four) hours as needed for severe pain.    . ranitidine (ZANTAC) 150 MG tablet Take 1 tablet (150 mg total) by mouth 2 (two) times daily. 14 tablet 0  . Saw Palmetto-Pumpkin Seed Oil 160-100 MG CAPS Take 2 capsules by mouth daily.    . sildenafil (VIAGRA) 100 MG tablet Take 50-100 mg by mouth daily as needed for erectile dysfunction.    . tamsulosin (FLOMAX) 0.4 MG CAPS capsule Take 1 capsule (0.4 mg total) by mouth daily. 7 capsule 0  . traZODone (DESYREL) 50 MG tablet Take 1 tablet (50 mg total) by mouth at bedtime as needed for sleep. 90 tablet 1   No current facility-administered medications for this visit.     Allergies  Allergen Reactions  . Omeprazole Anaphylaxis  . Nsaids Nausea And Vomiting    Family History  Problem Relation Age of Onset  . Heart disease Mother   . Hypertension Mother     Social History   Social History  . Marital status: Married    Spouse name: N/A  . Number of children: N/A  . Years of education: N/A   Occupational History  . Not on file.   Social History Main Topics  .  Smoking status: Former Smoker    Quit date: 08/09/1990  . Smokeless tobacco: Never Used  . Alcohol use No  . Drug use: No  . Sexual activity: Yes   Other Topics Concern  . Not on file   Social History Narrative  . No narrative on file    ROS:  Constitutional: Denies fever, malaise, fatigue, headache or abrupt weight changes.  HEENT: Denies eye pain, eye redness, ear pain, ringing in the ears, wax buildup, runny nose, nasal congestion, bloody nose, or sore throat. Respiratory: Denies difficulty breathing, shortness of breath, cough or sputum production.   Cardiovascular: Denies chest pain, chest tightness, palpitations or swelling in the hands or feet.  Gastrointestinal: Denies abdominal pain, bloating, constipation, diarrhea or blood in the stool.  GU: Denies frequency, urgency, pain with urination, blood in urine, odor or  discharge. Musculoskeletal: Pt reports back pain. Denies  difficulty with gait, muscle pain or joint pain and swelling.  Skin: Denies redness, rashes, lesions or ulcercations.  Neurological: Denies dizziness, difficulty with memory, difficulty with speech or problems with balance and coordination.  Psych: Denies anxiety, depression, SI/HI.  No other specific complaints in a complete review of systems (except as listed in HPI above).  PE:  BP 122/66 (BP Location: Left Arm, Patient Position: Sitting, Cuff Size: Normal)   Pulse 82   Temp 98.4 F (36.9 C) (Oral)   Wt 194 lb (88 kg)   SpO2 98%   BMI 29.94 kg/m    Wt Readings from Last 3 Encounters:  11/06/15 189 lb (85.7 kg)  05/06/15 191 lb (86.6 kg)  08/12/14 189 lb (85.7 kg)    General: Appears his stated age, well developed, well nourished in NAD. Skin: Warm, dry and intact.  Cardiovascular: Normal rate and rhythm. S1,S2 noted.  No murmur, rubs or gallops noted. No JVD or BLE edema. No carotid bruits noted. Pulmonary/Chest: Normal effort and positive vesicular breath sounds. No respiratory distress. No wheezes, rales or ronchi noted.  Abdomen: Soft and nontender.  Musculoskeletal: Decreased flexion and extension due to pain. Normal rotation. Pain with palpation over the lumbar spine. No difficulty with gait.  Neurological: Alert and oriented. Psychiatric: Mood and affect normal. Behavior is normal. Judgment and thought content normal.    Assessment and Plan:

## 2016-05-16 ENCOUNTER — Encounter: Payer: Self-pay | Admitting: Internal Medicine

## 2016-05-16 MED ORDER — ENALAPRIL MALEATE 10 MG PO TABS: 10.0000 mg | ORAL_TABLET | Freq: Two times a day (BID) | ORAL | 3 refills | Status: DC

## 2016-05-16 NOTE — Assessment & Plan Note (Signed)
No residual effect Continue Lipitor and ASA

## 2016-05-16 NOTE — Assessment & Plan Note (Signed)
Controlled on Norvasc, Enalapril and HCTZ He will bring me a copy of his lab results Will request ECG from a copy of his previous provider

## 2016-05-16 NOTE — Assessment & Plan Note (Signed)
He will continue to follow with pain management Continue Oxycodone

## 2016-05-16 NOTE — Patient Instructions (Signed)

## 2016-05-16 NOTE — Assessment & Plan Note (Signed)
He will bring me a copy of his most recent labs Encouraged him to consume a low fat, low carb diet Foot exam today Encouraged yearly eye exams Flu, Pneumovax and Prevnar UTD No microalbumin secondary to ACEI therapy

## 2016-05-16 NOTE — Assessment & Plan Note (Signed)
Advised him to avoid foods that trigger his reflux Continue Zantac

## 2016-05-16 NOTE — Assessment & Plan Note (Signed)
Continue Trazadone 

## 2016-05-16 NOTE — Assessment & Plan Note (Signed)
-   Continue Flomax 

## 2016-05-16 NOTE — Assessment & Plan Note (Signed)
He will bring me a copy of his most recent labs Encouraged him to consume a low fat diet Continue Lipitor and ASA

## 2016-06-14 ENCOUNTER — Encounter: Payer: Self-pay | Admitting: Internal Medicine

## 2016-07-06 ENCOUNTER — Telehealth: Payer: Self-pay | Admitting: Internal Medicine

## 2016-07-06 NOTE — Telephone Encounter (Signed)
Pt came in today  With a $50 no show fee and wanted to know if you can do something about this.  He stated he thought the appointmetn was 1:15 instead of 1

## 2016-09-08 ENCOUNTER — Telehealth: Payer: Self-pay | Admitting: Internal Medicine

## 2016-09-08 NOTE — Telephone Encounter (Signed)
Pt is requesting a waiver for no show fee. Pt says he can't remember things.

## 2016-09-09 ENCOUNTER — Other Ambulatory Visit: Payer: Self-pay

## 2016-09-09 MED ORDER — AMLODIPINE BESYLATE 5 MG PO TABS: 5.0000 mg | ORAL_TABLET | Freq: Every day | ORAL | 1 refills | Status: DC

## 2016-09-09 MED ORDER — TRAZODONE HCL 50 MG PO TABS: 50.0000 mg | ORAL_TABLET | Freq: Every evening | ORAL | 1 refills | Status: DC | PRN

## 2016-09-09 MED ORDER — ATORVASTATIN CALCIUM 80 MG PO TABS: 80.0000 mg | ORAL_TABLET | Freq: Every day | ORAL | 1 refills | Status: DC

## 2016-09-09 NOTE — Telephone Encounter (Signed)
Pt notified per v/m(DPR signed) that refills done as requested.

## 2016-09-09 NOTE — Telephone Encounter (Signed)
Pt left note at front desk requesting refills to [facility] mail order pharmacy for amlodipine(done), atorvastatin(Done) and trazodone(last refilled # 90 x 1 on 12/10/15. Pt last seen 05/14/16 and has 6 mth f/u on 11/15/16.Please advise.

## 2016-10-11 LAB — HM DIABETES EYE EXAM

## 2016-11-15 ENCOUNTER — Ambulatory Visit: Payer: Commercial Managed Care - HMO | Admitting: Internal Medicine

## 2016-11-15 ENCOUNTER — Encounter: Payer: Commercial Managed Care - HMO | Admitting: Internal Medicine

## 2016-12-18 ENCOUNTER — Other Ambulatory Visit: Payer: Self-pay | Admitting: Internal Medicine

## 2016-12-22 ENCOUNTER — Other Ambulatory Visit: Payer: Self-pay

## 2016-12-22 MED ORDER — ENALAPRIL MALEATE 10 MG PO TABS
10.0000 mg | ORAL_TABLET | Freq: Two times a day (BID) | ORAL | 0 refills | Status: DC
Start: 2016-12-22 — End: 2019-07-16

## 2016-12-22 NOTE — Telephone Encounter (Signed)
Pt waiting on mail order refill for enalapril; pt request # [phone] to [facility]. Advised pt done.

## 2016-12-27 ENCOUNTER — Ambulatory Visit: Payer: Medicare PPO | Admitting: Internal Medicine

## 2016-12-29 ENCOUNTER — Other Ambulatory Visit: Payer: Self-pay | Admitting: Internal Medicine

## 2017-01-04 ENCOUNTER — Encounter: Payer: Self-pay | Admitting: Internal Medicine

## 2017-01-04 ENCOUNTER — Ambulatory Visit (INDEPENDENT_AMBULATORY_CARE_PROVIDER_SITE_OTHER): Payer: Medicare PPO | Admitting: Internal Medicine

## 2017-01-04 ENCOUNTER — Telehealth: Payer: Self-pay

## 2017-01-04 DIAGNOSIS — G8929 Other chronic pain: Secondary | ICD-10-CM

## 2017-01-04 DIAGNOSIS — E119 Type 2 diabetes mellitus without complications: Secondary | ICD-10-CM | POA: Diagnosis not present

## 2017-01-04 DIAGNOSIS — I639 Cerebral infarction, unspecified: Secondary | ICD-10-CM

## 2017-01-04 DIAGNOSIS — E7800 Pure hypercholesterolemia, unspecified: Secondary | ICD-10-CM

## 2017-01-04 DIAGNOSIS — N400 Enlarged prostate without lower urinary tract symptoms: Secondary | ICD-10-CM

## 2017-01-04 DIAGNOSIS — M545 Low back pain, unspecified: Secondary | ICD-10-CM

## 2017-01-04 DIAGNOSIS — F5101 Primary insomnia: Secondary | ICD-10-CM

## 2017-01-04 DIAGNOSIS — K219 Gastro-esophageal reflux disease without esophagitis: Secondary | ICD-10-CM

## 2017-01-04 DIAGNOSIS — I10 Essential (primary) hypertension: Secondary | ICD-10-CM

## 2017-01-04 MED ORDER — TAMSULOSIN HCL 0.4 MG PO CAPS
0.4000 mg | ORAL_CAPSULE | Freq: Every day | ORAL | 0 refills | Status: DC
Start: 2017-01-04 — End: 2017-10-04

## 2017-01-04 MED ORDER — TAMSULOSIN HCL 0.4 MG PO CAPS: 0.4000 mg | ORAL_CAPSULE | Freq: Every day | ORAL | 0 refills | Status: DC

## 2017-01-04 NOTE — Assessment & Plan Note (Signed)
Discussed avoiding foods that trigger his reflux Continue Zantac Will monitor

## 2017-01-04 NOTE — Assessment & Plan Note (Signed)
No residual effect Continue Lipitor and ASA Yearly CMET and lipid profile at [place]- he will bring me a copy of the results

## 2017-01-04 NOTE — Assessment & Plan Note (Signed)
Sleeps well with the use of Trazadone Will monitor

## 2017-01-04 NOTE — Addendum Note (Signed)
Addended by: Helene Shoe on: 01/04/2017 12:53 PM   Modules accepted: Orders

## 2017-01-04 NOTE — Telephone Encounter (Signed)
Amy from [facility] called [facility] would not fill flomax because ins said med just filled today; the med fill today was mail order; Katherina Right will fill the # 70.

## 2017-01-04 NOTE — Assessment & Plan Note (Signed)
Reasonable control on Amlodpine, Enalapril and HCT CMET yearly at [place]- he will bring me a copy of the results Will monitor

## 2017-01-04 NOTE — Assessment & Plan Note (Signed)
Yearly A1C at [facility] No microalbumin secondary to ACEI therapy No medications at this time- will monitor Continue yearly eye exams Foot exam today Flu and pneumovax UTD

## 2017-01-04 NOTE — Assessment & Plan Note (Signed)
Controlled on Flomax Refilled today per pt request

## 2017-01-04 NOTE — Patient Instructions (Signed)
Fat and Cholesterol Restricted Diet Getting too much fat and cholesterol in your diet may cause health problems. Following this diet helps keep your fat and cholesterol at normal levels. This can keep you from getting sick. What types of fat should I choose?  Choose monosaturated and polyunsaturated fats. These are found in foods such as olive oil, canola oil, flaxseeds, walnuts, almonds, and seeds.  Eat more omega-3 fats. Good choices include salmon, mackerel, sardines, tuna, flaxseed oil, and ground flaxseeds.  Limit saturated fats. These are in animal products such as meats, butter, and cream. They can also be in plant products such as palm oil, palm kernel oil, and coconut oil.  Avoid foods with partially hydrogenated oils in them. These contain trans fats. Examples of foods that have trans fats are stick margarine, some tub margarines, cookies, crackers, and other baked goods. What general guidelines do I need to follow?  Check food labels. Look for the words "trans fat" and "saturated fat."  When preparing a meal:  Fill half of your plate with vegetables and green salads.  Fill one fourth of your plate with whole grains. Look for the word "whole" as the first word in the ingredient list.  Fill one fourth of your plate with lean protein foods.  Eat more foods that have fiber, like apples, carrots, beans, peas, and barley.  Eat more home-cooked foods. Eat less at restaurants and buffets.  Limit or avoid alcohol.  Limit foods high in starch and sugar.  Limit fried foods.  Cook foods without frying them. Baking, boiling, grilling, and broiling are all great options.  Lose weight if you are overweight. Losing even a small amount of weight can help your overall health. It can also help prevent diseases such as diabetes and heart disease. What foods can I eat? Grains  Whole grains, such as whole wheat or whole grain breads, crackers, cereals, and pasta. Unsweetened oatmeal,  bulgur, barley, quinoa, or brown rice. Corn or whole wheat flour tortillas. Vegetables  Fresh or frozen vegetables (raw, steamed, roasted, or grilled). Green salads. Fruits  All fresh, canned (in natural juice), or frozen fruits. Meat and Other Protein Products  Ground beef (85% or leaner), grass-fed beef, or beef trimmed of fat. Skinless chicken or turkey. Ground chicken or turkey. Pork trimmed of fat. All fish and seafood. Eggs. Dried beans, peas, or lentils. Unsalted nuts or seeds. Unsalted canned or dry beans. Dairy  Low-fat dairy products, such as skim or 1% milk, 2% or reduced-fat cheeses, low-fat ricotta or cottage cheese, or plain low-fat yogurt. Fats and Oils  Tub margarines without trans fats. Light or reduced-fat mayonnaise and salad dressings. Avocado. Olive, canola, sesame, or safflower oils. Natural peanut or almond butter (choose ones without added sugar and oil). The items listed above may not be a complete list of recommended foods or beverages. Contact your dietitian for more options.  What foods are not recommended? Grains  White bread. White pasta. White rice. Cornbread. Bagels, pastries, and croissants. Crackers that contain trans fat. Vegetables  White potatoes. Corn. Creamed or fried vegetables. Vegetables in a cheese sauce. Fruits  Dried fruits. Canned fruit in light or heavy syrup. Fruit juice. Meat and Other Protein Products  Fatty cuts of meat. Ribs, chicken wings, bacon, sausage, bologna, salami, chitterlings, fatback, hot dogs, bratwurst, and packaged luncheon meats. Liver and organ meats. Dairy  Whole or 2% milk, cream, half-and-half, and cream cheese. Whole milk cheeses. Whole-fat or sweetened yogurt. Full-fat cheeses. Nondairy creamers and whipped   toppings. Processed cheese, cheese spreads, or cheese curds. Sweets and Desserts  Corn syrup, sugars, honey, and molasses. Candy. Jam and jelly. Syrup. Sweetened cereals. Cookies, pies, cakes, donuts, muffins, and ice  cream. Fats and Oils  Butter, stick margarine, lard, shortening, ghee, or bacon fat. Coconut, palm kernel, or palm oils. Beverages  Alcohol. Sweetened drinks (such as sodas, lemonade, and fruit drinks or punches). The items listed above may not be a complete list of foods and beverages to avoid. Contact your dietitian for more information.  This information is not intended to replace advice given to you by your health care provider. Make sure you discuss any questions you have with your health care provider. Document Released: 01/25/2012 Document Revised: 04/01/2016 Document Reviewed: 10/25/2013 Elsevier Interactive Patient Education  2017 [employer]  

## 2017-01-04 NOTE — Assessment & Plan Note (Signed)
Yearly CMET and lipid profile at [place]- he will bring me a copy of the results Encouraged him to consume a low fat diet Continue Lipitor

## 2017-01-04 NOTE — Progress Notes (Signed)
Subjective:    Patient ID: James Pearson, male    DOB: 1946-04-26, 71 y.o.   MRN: [id number]  HPI  Pt presents to the clinic today for follow up of chronic conditions.  HTN: His BP today is 128/72. He is taking Amlodipine, Enalapril and HCTZ. There is no ECG on file, but he reports he had one in the past with his previous PCP.  Chronic Low Back Pain: He follows with pain management at the [place]. He takes Oxycodone as prescribed.  HLD: His is not sure what his last LDL was but reports it is drawn yearly in August at the [place]. He is taking Lipitor as prescribed. He denies myalgias. He has been trying to consume a low fat diet.  DM 2: His last A1C was 6.9%. He does not check his sugars. He is currently not taking any diabetic medication at this time, diabetes controlled with diet. He does not check his feet daily. His last eye exam was 10/2016. Flu 04/2016. Pneumovax 10/2014. Prevnar never.  History of Stroke: No residual effect. He takes Lipitor and ASA daily.  BPH: He denies any urinary issues on Flomax. He reports he is currently out of his medication and needs a refill of this today.  GERD: Triggered by spicy and greasy foods. He takes Zantac as prescribed. He denies breakthrough symptoms.  Insomnia: He has trouble falling asleep and staying asleep. He is taking 1.5 tabs of Trazodone daily.   Review of Systems      Past Medical History:  Diagnosis Date  . Arthritis   . Hepatitis   . Hypertension   . 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 Stroke [facility]) 2013    Current Outpatient Prescriptions  Medication Sig Dispense Refill  . amLODipine (NORVASC) 5 MG tablet Take 1 tablet (5 mg total) by mouth daily. 90 tablet 1  . aspirin 81 MG chewable tablet Chew 81 mg by mouth daily.    Marland Kitchen atorvastatin (LIPITOR) 80 MG tablet Take 1 tablet (80 mg total) by mouth daily. 90 tablet 1  . enalapril (VASOTEC) 10 MG tablet Take 1 tablet (10 mg total) by mouth 2 (two) times daily. 14 tablet 0  .  hydrochlorothiazide (HYDRODIURIL) 25 MG tablet Take 1 tablet by mouth daily.    . Misc Natural Products (PROSTATE HEALTH PO) Take 1 tablet by mouth daily.    . Multiple Vitamin (MULTIVITAMIN WITH MINERALS) TABS tablet Take 1 tablet by mouth daily.    [name] 3-Lutein-Zeaxanthin (ADVANCED EYE HEALTH PO) Take 1 tablet by mouth daily.    Marland Kitchen OVER THE COUNTER MEDICATION Take 3 capsules by mouth daily. Testropin supplement    . oxyCODONE (OXY IR/ROXICODONE) 5 MG immediate release tablet Take 5 mg by mouth every 4 (four) hours as needed for severe pain.    . ranitidine (ZANTAC) 150 MG tablet Take 1 tablet (150 mg total) by mouth 2 (two) times daily. 14 tablet 0  . Saw Palmetto-Pumpkin Seed Oil 160-100 MG CAPS Take 2 capsules by mouth daily.    . sildenafil (VIAGRA) 100 MG tablet Take 50-100 mg by mouth daily as needed for erectile dysfunction.    . tamsulosin (FLOMAX) 0.4 MG CAPS capsule Take 1 capsule (0.4 mg total) by mouth daily. 7 capsule 0  . traZODone (DESYREL) 50 MG tablet Take 1 tablet (50 mg total) by mouth at bedtime as needed for sleep. 90 tablet 1   No current facility-administered medications for this visit.  Allergies  Allergen Reactions  . Omeprazole Anaphylaxis  . Nsaids Nausea And Vomiting    Family History  Problem Relation Age of Onset  . Heart disease Mother   . Hypertension Mother     Social History   Social History  . Marital status: Married    Spouse name: N/A  . Number of children: N/A  . Years of education: N/A   Occupational History  . Not on file.   Social History Main Topics  . Smoking status: Former Smoker    Quit date: 08/09/1990  . Smokeless tobacco: Never Used  . Alcohol use No  . Drug use: No  . Sexual activity: Yes   Other Topics Concern  . Not on file   Social History Narrative  . No narrative on file     Constitutional: Denies fever, malaise, fatigue, headache or abrupt weight changes.  HEENT: Denies eye pain, eye redness, ear pain,  ringing in the ears, wax buildup, runny nose, nasal congestion, bloody nose, or sore throat. Respiratory: Denies difficulty breathing, shortness of breath, cough or sputum production.   Cardiovascular: Denies chest pain, chest tightness, palpitations or swelling in the hands or feet.  Gastrointestinal: Denies abdominal pain, bloating, constipation, diarrhea or blood in the stool.  GU: Denies urgency, frequency, pain with urination, burning sensation, blood in urine, odor or discharge. Musculoskeletal: Pt reports chronic back pain. Denies decrease in range of motion, difficulty with gait, muscle pain or joint swelling.  Skin: Denies redness, rashes, lesions or ulcercations.  Neurological: Denies dizziness, difficulty with memory, difficulty with speech or problems with balance and coordination.  Psych: Denies anxiety, depression, SI/HI.  No other specific complaints in a complete review of systems (except as listed in HPI above).  Objective:   Physical Exam   BP 128/72   Pulse 77   Temp 98.3 F (36.8 C) (Oral)   Wt 188 lb 8 oz (85.5 kg)   SpO2 98%   BMI 29.09 kg/m  Wt Readings from Last 3 Encounters:  01/04/17 188 lb 8 oz (85.5 kg)  05/14/16 194 lb (88 kg)  11/06/15 189 lb (85.7 kg)    General: Appears his stated age, well developed, well nourished in NAD. Cardiovascular: Normal rate and rhythm. S1,S2 noted.  No murmur, rubs or gallops noted. No JVD or BLE edema. No carotid bruits noted. Pulmonary/Chest: Normal effort and positive vesicular breath sounds. No respiratory distress. No wheezes, rales or ronchi noted.  Abdomen: Soft and nontender. Normal bowel sounds.  Musculoskeletal: Tender to palpation over the lumbar spine. No difficulty with gait.  Neurological: Alert and oriented.    BMET No results found for: NA, K, CL, CO2, GLUCOSE, BUN, CREATININE, CALCIUM, GFRNONAA, GFRAA  Lipid Panel     Component Value Date/Time   CHOL 100 04/10/2015   TRIG 48 04/10/2015   HDL 53  04/10/2015   LDLCALC 37 04/10/2015    CBC No results found for: WBC, RBC, HGB, HCT, PLT, MCV, MCH, MCHC, RDW, LYMPHSABS, MONOABS, EOSABS, BASOSABS  Hgb A1C Lab Results  Component Value Date   HGBA1C 6.9 (A) 04/10/2015           Assessment & Plan:

## 2017-01-04 NOTE — Assessment & Plan Note (Signed)
Chronic but stable on Oxycodone He understands that I will not fill narcotics for him

## 2017-01-11 ENCOUNTER — Telehealth: Payer: Self-pay | Admitting: Internal Medicine

## 2017-01-11 NOTE — Telephone Encounter (Signed)
Pt has been put on nurse visit schedule so I can recheck pt's weight

## 2017-01-11 NOTE — Telephone Encounter (Signed)
Pt came in concerned and wanted to leave a message stating he has lost weight since his last visit. His weight is currently 182.4. He said he doesn't lose weight fast. Requesting a phone call at [phone].

## 2017-01-11 NOTE — Telephone Encounter (Signed)
Can you call him and see what his concerns are. He may need to come if for an OV to check his weight.

## 2017-01-12 ENCOUNTER — Telehealth: Payer: Self-pay

## 2017-01-12 ENCOUNTER — Ambulatory Visit: Payer: Medicare PPO

## 2017-01-12 VITALS — Wt 188.2 lb

## 2017-01-12 DIAGNOSIS — R634 Abnormal weight loss: Secondary | ICD-10-CM

## 2017-01-12 NOTE — Telephone Encounter (Signed)
Pt's weight today was 188.25... I explained to pt that there can be different readings depending on the scale and that it is best to follow just 1 scale for weight mgmt.... I also let pt know that weighing in the AM with no clothes and shoes can account for 3-5 pounds or so... He expressed understanding... Please advise

## 2017-01-12 NOTE — Telephone Encounter (Signed)
He typically appears to weigh 189-191. The 194 may have been r/t fluid retention or extra clothes. Continue to monitor for now

## 2017-02-18 ENCOUNTER — Other Ambulatory Visit: Payer: Self-pay | Admitting: Internal Medicine

## 2017-03-22 ENCOUNTER — Other Ambulatory Visit: Payer: Self-pay | Admitting: Internal Medicine

## 2017-03-22 NOTE — Telephone Encounter (Signed)
PT walked in needing to get a refill on atorvastatin 80mg  tab take 1 tablet every day  Pt would like to have 2 refill on this  [place] Pt has 5 pills left.  Best number to contact him [phone]

## 2017-03-22 NOTE — Telephone Encounter (Signed)
Pt also wants to know if he can stop taking chol meds... But I do not see a recent Lipid in chart... Please advise

## 2017-03-23 MED ORDER — ATORVASTATIN CALCIUM 80 MG PO TABS: 80.0000 mg | ORAL_TABLET | Freq: Every day | ORAL | 1 refills | Status: DC

## 2017-03-23 NOTE — Telephone Encounter (Signed)
Left message on voicemail.

## 2017-03-23 NOTE — Telephone Encounter (Signed)
He needs to continue Lipitor for now. Will check lipid at next visit.

## 2017-04-01 LAB — LIPID PANEL
Cholesterol: 98 (ref 0–200)
HDL: 63 (ref 35–70)
LDL CALC: 21
Triglycerides: 69 (ref 40–160)

## 2017-04-01 LAB — HEMOGLOBIN A1C: HEMOGLOBIN A1C: 6.8

## 2017-04-01 LAB — PSA: PSA: 2.9

## 2017-04-01 LAB — BASIC METABOLIC PANEL: CREATININE: 1.2 (ref <=1.3)

## 2017-05-09 ENCOUNTER — Other Ambulatory Visit: Payer: Self-pay

## 2017-05-09 ENCOUNTER — Other Ambulatory Visit: Payer: Self-pay | Admitting: Internal Medicine

## 2017-05-09 MED ORDER — HYDROCHLOROTHIAZIDE 25 MG PO TABS: 25.0000 mg | ORAL_TABLET | Freq: Every day | ORAL | 1 refills | Status: DC

## 2017-05-09 NOTE — Telephone Encounter (Signed)
Pt left v/m requesting HCTZ to [facility]; annual 01/04/17. Refill done per protocol.

## 2017-08-25 ENCOUNTER — Encounter: Payer: Self-pay | Admitting: Internal Medicine

## 2017-10-03 ENCOUNTER — Other Ambulatory Visit: Payer: Self-pay | Admitting: Internal Medicine

## 2017-10-04 ENCOUNTER — Other Ambulatory Visit: Payer: Self-pay | Admitting: Internal Medicine

## 2017-10-04 NOTE — Telephone Encounter (Signed)
Please advise if appropriate to refill 

## 2017-10-05 ENCOUNTER — Other Ambulatory Visit: Payer: Self-pay

## 2017-10-05 MED ORDER — RANITIDINE HCL 150 MG PO TABS: 150.0000 mg | ORAL_TABLET | Freq: Two times a day (BID) | ORAL | 2 refills | Status: DC

## 2017-10-24 LAB — HEMOGLOBIN A1C: Hemoglobin A1C: 6.9

## 2017-10-24 LAB — LIPID PANEL
Cholesterol: 183 (ref 0–200)
HDL: 65 (ref 35–70)
LDL CALC: 99
Triglycerides: 124 (ref 40–160)

## 2017-10-24 LAB — BASIC METABOLIC PANEL
CREATININE: 1.2 (ref 0.6–1.3)
Potassium: 3.4 (ref 3.4–5.3)

## 2017-11-10 ENCOUNTER — Telehealth: Payer: Self-pay | Admitting: Internal Medicine

## 2017-11-10 DIAGNOSIS — E119 Type 2 diabetes mellitus without complications: Secondary | ICD-10-CM

## 2017-11-10 DIAGNOSIS — E1110 Type 2 diabetes mellitus with ketoacidosis without coma: Secondary | ICD-10-CM

## 2017-11-10 NOTE — Telephone Encounter (Signed)
Patient needs a referral for a comprehensive eye exam. (Diabetic Eye Test)

## 2017-11-11 NOTE — Addendum Note (Signed)
Addended by: Jearld Fenton on: 11/11/2017 05:21 PM   Modules accepted: Orders

## 2017-11-11 NOTE — Telephone Encounter (Signed)
Referral placed.

## 2017-11-11 NOTE — Telephone Encounter (Signed)
Pt has James Pearson and is required to get referral for yearly diabetic eye exam; pt does not have preference of eye doctor; pt lives in [place] and wants to go to [facility]. Before now pt has gone to [place] but pt prefers to go local this time. Pt will wait to hear back from [facility].

## 2017-12-01 ENCOUNTER — Encounter: Payer: Self-pay | Admitting: Internal Medicine

## 2017-12-01 ENCOUNTER — Ambulatory Visit: Payer: Medicare PPO | Admitting: Internal Medicine

## 2017-12-01 VITALS — BP 158/70 | HR 68 | Temp 98.3°F | Ht 67.5 in | Wt 190.5 lb

## 2017-12-01 DIAGNOSIS — R432 Parageusia: Secondary | ICD-10-CM | POA: Diagnosis not present

## 2017-12-01 DIAGNOSIS — R630 Anorexia: Secondary | ICD-10-CM

## 2017-12-01 NOTE — Progress Notes (Signed)
Subjective:    Patient ID: James Pearson, male    DOB: 12/23/1945, 72 y.o.   MRN: [id number]  HPI  Pt presents to the clinic today with c/o decreased appetite. He reports he recently had all his teeth pulled so that he could get dentures. He reports he taste sensation is slightly decreased. His weight is steady. His bowels are moving normally.   Review of Systems  Past Medical History:  Diagnosis Date  . Arthritis   . Hepatitis   . Hypertension   . 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 Stroke [facility]) 2013    Current Outpatient Medications  Medication Sig Dispense Refill  . amLODipine (NORVASC) 5 MG tablet TAKE 1 TABLET EVERY DAY 90 tablet 0  . aspirin 81 MG chewable tablet Chew 81 mg by mouth daily.    Marland Kitchen atorvastatin (LIPITOR) 80 MG tablet Take 1 tablet (80 mg total) by mouth daily. 90 tablet 1  . enalapril (VASOTEC) 10 MG tablet Take 1 tablet (10 mg total) by mouth 2 (two) times daily. 14 tablet 0  . hydrochlorothiazide (HYDRODIURIL) 25 MG tablet Take 1 tablet (25 mg total) by mouth daily. 90 tablet 1  . Misc Natural Products (PROSTATE HEALTH PO) Take 1 tablet by mouth daily.    . Multiple Vitamin (MULTIVITAMIN WITH MINERALS) TABS tablet Take 1 tablet by mouth daily.    [name] 3-Lutein-Zeaxanthin (ADVANCED EYE HEALTH PO) Take 1 tablet by mouth daily.    Marland Kitchen OVER THE COUNTER MEDICATION Take 3 capsules by mouth daily. Testropin supplement    . oxyCODONE (OXY IR/ROXICODONE) 5 MG immediate release tablet Take 5 mg by mouth every 4 (four) hours as needed for severe pain.    . ranitidine (ZANTAC) 150 MG tablet Take 1 tablet (150 mg total) by mouth 2 (two) times daily. 60 tablet 2  . Saw Palmetto-Pumpkin Seed Oil 160-100 MG CAPS Take 2 capsules by mouth daily.    . sildenafil (VIAGRA) 100 MG tablet Take 50-100 mg by mouth daily as needed for erectile dysfunction.    . tamsulosin (FLOMAX) 0.4 MG CAPS capsule TAKE ONE (1) CAPSULE BY MOUTH EACH DAY 7 capsule 0  . traZODone (DESYREL) 50 MG  tablet Take 1 tablet (50 mg total) by mouth at bedtime as needed. for sleep 90 tablet 0   No current facility-administered medications for this visit.     Allergies  Allergen Reactions  . Omeprazole Anaphylaxis  . Nsaids Nausea And Vomiting    Family History  Problem Relation Age of Onset  . Heart disease Mother   . Hypertension Mother     Social History   Socioeconomic History  . Marital status: Married    Spouse name: Not on file  . Number of children: Not on file  . Years of education: Not on file  . Highest education level: Not on file  Occupational History  . Not on file  Social Needs  . Financial resource strain: Not on file  . Food insecurity:    Worry: Not on file    Inability: Not on file  . Transportation needs:    Medical: Not on file    Non-medical: Not on file  Tobacco Use  . Smoking status: Former Smoker    Last attempt to quit: 08/09/1990    Years since quitting: 27.3  . Smokeless tobacco: Never Used  Substance and Sexual Activity  . Alcohol use: No    Alcohol/week: 0.0 oz  . Drug  use: No  . Sexual activity: Yes  Lifestyle  . Physical activity:    Days per week: Not on file    Minutes per session: Not on file  . Stress: Not on file  Relationships  . Social connections:    Talks on phone: Not on file    Gets together: Not on file    Attends religious service: Not on file    Active member of club or organization: Not on file    Attends meetings of clubs or organizations: Not on file    Relationship status: Not on file  . Intimate partner violence:    Fear of current or ex partner: Not on file    Emotionally abused: Not on file    Physically abused: Not on file    Forced sexual activity: Not on file  Other Topics Concern  . Not on file  Social History Narrative  . Not on file     Constitutional: Denies fever, malaise, fatigue, headache or abrupt weight changes.  Gastrointestinal: Pt reports decreased appetite. Denies abdominal pain,  bloating, constipation, diarrhea or blood in the stool.   No other specific complaints in a complete review of systems (except as listed in HPI above).     Objective:   Physical Exam   BP (!) 158/70 (BP Location: Right Arm, Patient Position: Sitting, Cuff Size: Normal)   Pulse 68   Temp 98.3 F (36.8 C) (Oral)   Ht 5' 7.5" (1.715 m)   Wt 190 lb 8 oz (86.4 kg)   SpO2 98%   BMI 29.40 kg/m  Wt Readings from Last 3 Encounters:  12/01/17 190 lb 8 oz (86.4 kg)  01/12/17 188 lb 4 oz (85.4 kg)  01/04/17 188 lb 8 oz (85.5 kg)    General: Appears his stated age, well developed, well nourished in NAD. HEENT:  Throat/Mouth: Teeth present, mucosa pink and moist, no exudate, lesions or ulcerations noted.  Neck:  Neck supple, trachea midline. No masses, lumps or thyromegaly present.   Abdomen: Soft and nontender. Normal bowel sounds. No distention or masses noted.  Neurological: Alert and oriented.     BMET    Component Value Date/Time   CREATININE 1.2 04/01/2017    Lipid Panel     Component Value Date/Time   CHOL 98 04/01/2017   TRIG 69 04/01/2017   HDL 63 04/01/2017   LDLCALC 21 04/01/2017    CBC No results found for: WBC, RBC, HGB, HCT, PLT, MCV, MCH, MCHC, RDW, LYMPHSABS, MONOABS, EOSABS, BASOSABS  Hgb A1C Lab Results  Component Value Date   HGBA1C 6.8 04/01/2017           Assessment & Plan:   Decreased Appetite, Loss of Taste:  Weight is steady, and he is overweight so I do not want to put him on an appetite stimulant Discussed how loss of taste is often age related, and usually does not improve Discussed the importance of eating routine balanced meals.  RTC in 1-2 months to follow up chronic conditios (does CPE at [facility]) Webb Silversmith, NP

## 2018-01-09 ENCOUNTER — Encounter: Payer: Self-pay | Admitting: Internal Medicine

## 2018-01-09 DIAGNOSIS — E119 Type 2 diabetes mellitus without complications: Secondary | ICD-10-CM | POA: Diagnosis not present

## 2018-01-09 LAB — HM DIABETES EYE EXAM

## 2018-01-13 DIAGNOSIS — H04123 Dry eye syndrome of bilateral lacrimal glands: Secondary | ICD-10-CM | POA: Diagnosis not present

## 2018-01-13 DIAGNOSIS — H40033 Anatomical narrow angle, bilateral: Secondary | ICD-10-CM | POA: Diagnosis not present

## 2018-01-16 ENCOUNTER — Ambulatory Visit: Payer: Medicare PPO | Admitting: Internal Medicine

## 2018-01-16 ENCOUNTER — Encounter: Payer: Self-pay | Admitting: Internal Medicine

## 2018-01-16 VITALS — BP 138/68 | HR 68 | Temp 98.2°F | Ht 68.0 in | Wt 190.2 lb

## 2018-01-16 DIAGNOSIS — G8929 Other chronic pain: Secondary | ICD-10-CM | POA: Diagnosis not present

## 2018-01-16 DIAGNOSIS — E119 Type 2 diabetes mellitus without complications: Secondary | ICD-10-CM

## 2018-01-16 DIAGNOSIS — Z0000 Encounter for general adult medical examination without abnormal findings: Secondary | ICD-10-CM | POA: Diagnosis not present

## 2018-01-16 DIAGNOSIS — R002 Palpitations: Secondary | ICD-10-CM

## 2018-01-16 DIAGNOSIS — F5101 Primary insomnia: Secondary | ICD-10-CM | POA: Diagnosis not present

## 2018-01-16 DIAGNOSIS — I10 Essential (primary) hypertension: Secondary | ICD-10-CM | POA: Diagnosis not present

## 2018-01-16 DIAGNOSIS — M545 Low back pain: Secondary | ICD-10-CM

## 2018-01-16 DIAGNOSIS — I639 Cerebral infarction, unspecified: Secondary | ICD-10-CM

## 2018-01-16 DIAGNOSIS — N400 Enlarged prostate without lower urinary tract symptoms: Secondary | ICD-10-CM | POA: Diagnosis not present

## 2018-01-16 DIAGNOSIS — K219 Gastro-esophageal reflux disease without esophagitis: Secondary | ICD-10-CM

## 2018-01-16 DIAGNOSIS — E7800 Pure hypercholesterolemia, unspecified: Secondary | ICD-10-CM

## 2018-01-16 MED ORDER — CITALOPRAM HYDROBROMIDE 10 MG PO TABS: 10.0000 mg | ORAL_TABLET | Freq: Every day | ORAL | 5 refills | Status: DC

## 2018-01-16 NOTE — Progress Notes (Signed)
HPI:  Pt presents to the clinic today for his Medicare Wellness Exam. He is also due to follow up chronic conditions.  HTN: His BP today is 138/68. He is taking Amlodipine, Enalapril and HCTZ as prescribed. There is no ECG on file.  HLD: His last LDL was 99, 10/2017. He is not taking Atorvastatin as prescribed. He is using natural supplements instead. He tries to consume a low fat diet.  DM 2: His last A1C was 6.9, 10/2017. He does not check his sugars. He is not taking any diabetic medication at this time. He does not routinely check his feet. His last eye exam was 10/2017. Pneumovax 10/2014. Prevnar never.  History of Stroke: No residual effect. He is taking Amlodipine, Enalapril, HCTZ, Atorvastatin and ASA daily.  BPH: He voids fine on Flomax. He does have some nocturia.   GERD: Triggered by spicy and greasy foods. He takes Ranitidine as prescribed. He denies breakthrough symptoms.   Insomnia: He has trouble falling and staying asleep. This is controlled on Trazadone.  Chronic Back Pain: He is taking Oxycodone 5 mg IR. He follows with Dr. Beverely Risen at the [facility].  Past Medical History:  Diagnosis Date  . Arthritis   . Hepatitis   . Hypertension   . 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 Stroke [facility]) 2013    Current Outpatient Medications  Medication Sig Dispense Refill  . amLODipine (NORVASC) 5 MG tablet TAKE 1 TABLET EVERY DAY 90 tablet 0  . aspirin 81 MG chewable tablet Chew 81 mg by mouth daily.    Marland Kitchen atorvastatin (LIPITOR) 80 MG tablet Take 1 tablet (80 mg total) by mouth daily. 90 tablet 1  . enalapril (VASOTEC) 10 MG tablet Take 1 tablet (10 mg total) by mouth 2 (two) times daily. 14 tablet 0  . hydrochlorothiazide (HYDRODIURIL) 25 MG tablet Take 1 tablet (25 mg total) by mouth daily. 90 tablet 1  . Misc Natural Products (PROSTATE HEALTH PO) Take 1 tablet by mouth daily.    [name] 3-Lutein-Zeaxanthin (ADVANCED EYE HEALTH PO) Take 1 tablet by mouth daily.    Marland Kitchen OVER THE COUNTER  MEDICATION Take 3 capsules by mouth daily. Testropin supplement    . oxyCODONE (OXY IR/ROXICODONE) 5 MG immediate release tablet Take 5 mg by mouth every 4 (four) hours as needed for severe pain.    . ranitidine (ZANTAC) 150 MG tablet Take 1 tablet (150 mg total) by mouth 2 (two) times daily. 60 tablet 2  . Saw Palmetto-Pumpkin Seed Oil 160-100 MG CAPS Take 2 capsules by mouth daily.    . sildenafil (VIAGRA) 100 MG tablet Take 50-100 mg by mouth daily as needed for erectile dysfunction.    . tamsulosin (FLOMAX) 0.4 MG CAPS capsule TAKE ONE (1) CAPSULE BY MOUTH EACH DAY 7 capsule 0  . traZODone (DESYREL) 50 MG tablet Take 1 tablet (50 mg total) by mouth at bedtime as needed. for sleep 90 tablet 0   No current facility-administered medications for this visit.     Allergies  Allergen Reactions  . Omeprazole Anaphylaxis  . Nsaids Nausea And Vomiting    Family History  Problem Relation Age of Onset  . Heart disease Mother   . Hypertension Mother     Social History   Socioeconomic History  . Marital status: Married    Spouse name: Not on file  . Number of children: Not on file  . Years of education: Not on file  . Highest education  level: Not on file  Occupational History  . Not on file  Social Needs  . Financial resource strain: Not on file  . Food insecurity:    Worry: Not on file    Inability: Not on file  . Transportation needs:    Medical: Not on file    Non-medical: Not on file  Tobacco Use  . Smoking status: Former Smoker    Last attempt to quit: 08/09/1990    Years since quitting: 27.4  . Smokeless tobacco: Never Used  Substance and Sexual Activity  . Alcohol use: No    Alcohol/week: 0.0 oz  . Drug use: No  . Sexual activity: Yes  Lifestyle  . Physical activity:    Days per week: Not on file    Minutes per session: Not on file  . Stress: Not on file  Relationships  . Social connections:    Talks on phone: Not on file    Gets together: Not on file    Attends  religious service: Not on file    Active member of club or organization: Not on file    Attends meetings of clubs or organizations: Not on file    Relationship status: Not on file  . Intimate partner violence:    Fear of current or ex partner: Not on file    Emotionally abused: Not on file    Physically abused: Not on file    Forced sexual activity: Not on file  Other Topics Concern  . Not on file  Social History Narrative  . Not on file    Hospitiliaztions: None  Health Maintenance:    Flu: 04/2016  Tetanus: unsure  Pneumovax: 10/2014  Prevnar: about 6 years ago  Zostavax: 06/2014  PSA: 03/2017  Colon Screening: 06/2015  Eye Doctor: annually  Dental Exam: biannually   Providers:   PCP: Webb Silversmith, NP-C  [facility]P [facility]: Dr. Beverely Risen   I have personally reviewed and have noted:  1. The patient's medical and social history 2. Their use of alcohol, tobacco or illicit drugs 3. Their current medications and supplements 4. The patient's functional ability including ADL's, fall risks, home safety risks and hearing or visual impairment. 5. Diet and physical activities 6. Evidence for depression or mood disorder  Subjective:   Review of Systems:   Constitutional: Denies fever, malaise, fatigue, headache or abrupt weight changes.  HEENT: Denies eye pain, eye redness, ear pain, ringing in the ears, wax buildup, runny nose, nasal congestion, bloody nose, or sore throat. Respiratory: Denies difficulty breathing, shortness of breath, cough or sputum production.   Cardiovascular: Pt reports palpitation. Denies chest pain, chest tightness, or swelling in the hands or feet.  Gastrointestinal: Pt reports intermittent reflux. Denies abdominal pain, bloating, constipation, diarrhea or blood in the stool.  GU: Denies urgency, frequency, pain with urination, burning sensation, blood in urine, odor or discharge. Musculoskeletal: Pt reports chronic back pain. Denies decrease in range of motion,  difficulty with gait, or joint swelling.  Skin: Denies redness, rashes, lesions or ulcercations.  Neurological: Pt reports insomnia. Denies dizziness, difficulty with memory, difficulty with speech or problems with balance and coordination.  Psych: Denies anxiety, depression, SI/HI.  No other specific complaints in a complete review of systems (except as listed in HPI above).  Objective:  PE:   BP 138/68   Pulse 68   Temp 98.2 F (36.8 C) (Oral)   Ht 5\' 8"  (1.727 m)   Wt 190 lb 4 oz (86.3 kg)  BMI 28.93 kg/m   Wt Readings from Last 3 Encounters:  12/01/17 190 lb 8 oz (86.4 kg)  01/12/17 188 lb 4 oz (85.4 kg)  01/04/17 188 lb 8 oz (85.5 kg)    General: Appears his stated age, well developed, well nourished in NAD. Skin: Warm, dry and intact. No  ulcerations noted. HEENT: Head: normal shape and size; Eyes: sclera white, no icterus, conjunctiva pink, PERRLA and EOMs intact; Ears: Tm's gray and intact, normal light reflex; Throat/Mouth: Teeth present, mucosa pink and moist, no exudate, lesions or ulcerations noted.  Neck: Neck supple, trachea midline. No masses, lumps or thyromegaly present.  Cardiovascular: Normal rate and rhythm. S1,S2 noted.  No murmur, rubs or gallops noted. No JVD or BLE edema. No carotid bruits noted. Pulmonary/Chest: Normal effort and positive vesicular breath sounds. No respiratory distress. No wheezes, rales or ronchi noted.  Abdomen: Soft and nontender. Normal bowel sounds. No distention or masses noted. Liver, spleen and kidneys non palpable. Musculoskeletal: Strength 5/5 BUE/BLE. No signs of joint swelling.  Neurological: Alert and oriented. Cranial nerves II-XII grossly intact. Coordination normal.  Psychiatric: Mood and affect normal. Behavior is normal. Judgment and thought content normal.   Indication for ECG: palpitations Interpretation. Normal rate, normal QT, concern for left atrial enlargement No previous comparison  BMET    Component Value  Date/Time   CREATININE 1.2 04/01/2017    Lipid Panel     Component Value Date/Time   CHOL 183 10/27/2017   TRIG 124 10/27/2017   HDL 65 10/27/2017   LDLCALC 99 10/27/2017    CBC No results found for: WBC, RBC, HGB, HCT, PLT, MCV, MCH, MCHC, RDW, LYMPHSABS, MONOABS, EOSABS, BASOSABS  Hgb A1C Lab Results  Component Value Date   HGBA1C 6.9 10/27/2017      Assessment and Plan:   Medicare Annual Wellness Visit:  Diet: He does eat lean meat. He consumes fruits and veggies daily. He tries to avoid fried foods. He drinks mostly water and sweet tea. Physical activity: None Depression/mood screen: Negative Hearing: Intact to whispered voice Visual acuity: Grossly normal, performs annual eye exam  ADLs: Capable Fall risk: None Home safety: Good Cognitive evaluation: Intact to orientation, naming, recall and repetition EOL planning: Adv directives, full code/ I agree  Preventative Medicine: Encouraged him to get a flu shot in the fall. He declines tetanus booster. Pneumovax and prevnar UTD. Shingles vaccine UTD. PSA recently done by [facility], will abstract. Colon screening UTD. Encouraged him to consume a balanced diet and exercise regimen. Advised him to see an eye doctor and dentist annually. Labs from [place] abstracted.  Palpitations:  ECG, see interpretation above Will monitor  Next appointment: 1 year, Medicare Wellness Exam   Webb Silversmith, NP

## 2018-01-20 NOTE — Assessment & Plan Note (Signed)
-   Continue Flomax 

## 2018-01-20 NOTE — Assessment & Plan Note (Signed)
No residual effect Continue Amlodipine, Enalapril, HCTZ, Atorvastatin and ASA daily CBC, CMET, Lipid and A1C today

## 2018-01-20 NOTE — Assessment & Plan Note (Signed)
Recent A1C abstracted No microalbumin secondary to ACEI therapy Continue low carb diet Encouraged him to exercise for weight loss Foot exam today Encouraged yearly eye exam Immunizations UTD

## 2018-01-20 NOTE — Assessment & Plan Note (Signed)
He will continue Oxycodone prescribed by the [place]

## 2018-01-20 NOTE — Assessment & Plan Note (Signed)
Discussed avoiding foods that trigger reflux Continue Ranitidine CBC and CMET today

## 2018-01-20 NOTE — Assessment & Plan Note (Signed)
Controlled on Amlodipine, Enalapril and HCTZ Reinforced DASH diet and exercise for weight loss CBC and CMET today

## 2018-01-20 NOTE — Assessment & Plan Note (Signed)
CMET and Lipid profile today Encouraged him to consume a low fat diet Continue Atorvastatin 

## 2018-01-20 NOTE — Patient Instructions (Signed)

## 2018-01-20 NOTE — Assessment & Plan Note (Signed)
Controlled on Trazadone Will monitor

## 2018-02-17 ENCOUNTER — Encounter: Payer: Self-pay | Admitting: Internal Medicine

## 2018-04-20 ENCOUNTER — Encounter: Payer: Self-pay | Admitting: Internal Medicine

## 2018-04-20 ENCOUNTER — Other Ambulatory Visit: Payer: Self-pay | Admitting: Internal Medicine

## 2018-04-20 MED ORDER — TRAZODONE HCL 50 MG PO TABS: 50.0000 mg | ORAL_TABLET | Freq: Every evening | ORAL | 0 refills | Status: DC | PRN

## 2018-04-25 ENCOUNTER — Ambulatory Visit: Payer: Medicare PPO | Admitting: Internal Medicine

## 2018-04-25 ENCOUNTER — Encounter: Payer: Self-pay | Admitting: Internal Medicine

## 2018-04-25 VITALS — BP 132/72 | HR 91 | Temp 98.3°F | Wt 189.0 lb

## 2018-04-25 DIAGNOSIS — J329 Chronic sinusitis, unspecified: Secondary | ICD-10-CM

## 2018-04-25 DIAGNOSIS — B9789 Other viral agents as the cause of diseases classified elsewhere: Secondary | ICD-10-CM | POA: Diagnosis not present

## 2018-04-25 MED ORDER — METHYLPREDNISOLONE ACETATE 80 MG/ML IJ SUSP
80.0000 mg | Freq: Once | INTRAMUSCULAR | Status: AC
  Administered 2018-04-25: 80 mg via INTRAMUSCULAR

## 2018-04-25 NOTE — Progress Notes (Signed)
HPI  Pt presents to the clinic today with c/o headache and nasal congestion. This started 2-3 weeks ago. The headache is located in his forehead. He describes the pain as sore and achy. He denies sensitivity to light or sound, visual changes, dizziness, nausea or vomiting. He is not able to blow anything out of his nose. He denies runny nose, ear pain, sore throat, cough or shortness of breath. He denies fever, chills or body aches. He has tried Afrin and Coricidin with some relief. He has no history of allergies. He has not had sick contacts.  Review of Systems     Past Medical History:  Diagnosis Date  . Arthritis   . Hepatitis   . Hypertension   . 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 Stroke [facility]) 2013    Family History  Problem Relation Age of Onset  . Heart disease Mother   . Hypertension Mother     Social History   Socioeconomic History  . Marital status: Married    Spouse name: Not on file  . Number of children: Not on file  . Years of education: Not on file  . Highest education level: Not on file  Occupational History  . Not on file  Social Needs  . Financial resource strain: Not on file  . Food insecurity:    Worry: Not on file    Inability: Not on file  . Transportation needs:    Medical: Not on file    Non-medical: Not on file  Tobacco Use  . Smoking status: Former Smoker    Last attempt to quit: 08/09/1990    Years since quitting: 27.7  . Smokeless tobacco: Never Used  Substance and Sexual Activity  . Alcohol use: No    Alcohol/week: 0.0 standard drinks  . Drug use: No  . Sexual activity: Yes  Lifestyle  . Physical activity:    Days per week: Not on file    Minutes per session: Not on file  . Stress: Not on file  Relationships  . Social connections:    Talks on phone: Not on file    Gets together: Not on file    Attends religious service: Not on file    Active member of club or organization: Not on file    Attends meetings of clubs or organizations:  Not on file    Relationship status: Not on file  . Intimate partner violence:    Fear of current or ex partner: Not on file    Emotionally abused: Not on file    Physically abused: Not on file    Forced sexual activity: Not on file  Other Topics Concern  . Not on file  Social History Narrative  . Not on file    Allergies  Allergen Reactions  . Omeprazole Anaphylaxis  . Nsaids Nausea And Vomiting     Constitutional: Positive headache. Denies fatigue, fever or abrupt weight changes.  HEENT:  Positive nasal congestion. Denies eye redness, ear pain, ringing in the ears, wax buildup, runny nose or bloody nose. Respiratory:  Denies cough, difficulty breathing or shortness of breath.  Cardiovascular: Denies chest pain, chest tightness, palpitations or swelling in the hands or feet.   No other specific complaints in a complete review of systems (except as listed in HPI above).  Objective:   BP 132/72   Pulse 91   Temp 98.3 F (36.8 C) (Oral)   Wt 189 lb (85.7 kg)   SpO2  98%   BMI 28.74 kg/m   General: Appears his stated age, well developed, well nourished in NAD. HEENT: Head: normal shape and size, no sinus tenderness noted; Ears: Tm's gray and intact, normal light reflex; Nose: mucosa boggy and moist, septum midline; Throat/Mouth: + PND. Teeth present, mucosa erythematous and moist, + PND, no exudate noted, no lesions or ulcerations noted.  Neck:  No adenopathy noted.  Cardiovascular: Normal rate and rhythm. S1,S2 noted.  No murmur, rubs or gallops noted.  Pulmonary/Chest: Normal effort and positive vesicular breath sounds. No respiratory distress. No wheezes, rales or ronchi noted.       Assessment & Plan:   Viral Sinusitis  Can use a Neti Pot which can be purchased from your local drug store. Flonase 2 sprays each nostril for 3 days and then daily as needed. Start Zyrtec OTC- do not use for > 1 week given prostate issues 80 mg Depo IM today  RTC as needed or if  symptoms persist. Webb Silversmith, NP

## 2018-04-25 NOTE — Patient Instructions (Signed)

## 2018-04-25 NOTE — Addendum Note (Signed)
Addended by: Lurlean Nanny on: 04/25/2018 12:05 PM   Modules accepted: Orders

## 2018-07-03 ENCOUNTER — Telehealth: Payer: Self-pay | Admitting: *Deleted

## 2018-07-03 ENCOUNTER — Other Ambulatory Visit: Payer: Self-pay | Admitting: Internal Medicine

## 2018-07-03 NOTE — Telephone Encounter (Signed)
Ok for note stating pt should be allowed to have water bottle with him throughout the day, so that he can consume minimum 48 ounces of water daily.

## 2018-07-03 NOTE — Telephone Encounter (Signed)
Spoke to pt who states RBaity is aware of him having extreme dry mouth. Pt states there are drinking restrictions at his job and is requesting a letter, that allows him to be able to drink water throughout the day, outside of his lunches and breaks. pls advise

## 2018-07-03 NOTE — Telephone Encounter (Signed)
Patient informed us the number for [facility] is [phone].

## 2018-07-04 NOTE — Telephone Encounter (Signed)
Pt left v/m requesting cb about note for work for pt to have water at work.

## 2018-07-05 NOTE — Telephone Encounter (Signed)
Letter placed in the front office for pick up and pt is aware  

## 2018-10-26 ENCOUNTER — Other Ambulatory Visit: Payer: Self-pay | Admitting: Internal Medicine

## 2019-01-05 ENCOUNTER — Ambulatory Visit (INDEPENDENT_AMBULATORY_CARE_PROVIDER_SITE_OTHER): Payer: Medicare PPO | Admitting: Family Medicine

## 2019-01-05 ENCOUNTER — Encounter: Payer: Self-pay | Admitting: Family Medicine

## 2019-01-05 VITALS — BP 118/68 | HR 73 | Wt 193.0 lb

## 2019-01-05 DIAGNOSIS — H1013 Acute atopic conjunctivitis, bilateral: Secondary | ICD-10-CM

## 2019-01-05 NOTE — Progress Notes (Signed)
Virtual Visit via Video Note  I connected with James Pearson on 01/05/19 at 11:35 AM EDT by a video enabled telemedicine application and verified that I am speaking with the correct person using two identifiers.  Location: Patient: In his home Provider: In my home   I discussed the limitations of evaluation and management by telemedicine and the availability of in person appointments. The patient expressed understanding and agreed to proceed.  History of Present Illness: This is a 73 year old male who requests virtual visit today for evaluation of bilateral eye pain and swelling.  He first noticed it after standing outside of talking with a neighbor.  Started in his left eye and then 2 days later he noticed symptoms in the right eye as well.  In the beginning his eyes were itchy, and he had some foreign body sensation which has resolved.  Now they are red, feel uncomfortable and have occasional greenish drainage.  His eyes were matted this morning.  He has mild headache over his eyes.  No fever, cough, shortness of breath, runny nose or visual changes.  He does have a history of intermittent seasonal allergies and has been treated in the past for these.  He has been using some artificial tears and used an eye rinse without much improvement.  He reports that he slept well last night.  Past Medical History:  Diagnosis Date  . Arthritis   . Hepatitis   . Hypertension   . 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 Stroke [facility]) 2013   Past Surgical History:  Procedure Laterality Date  . CHOLECYSTECTOMY    . HAND SURGERY    . rotator cuff surgery     Family History  Problem Relation Age of Onset  . Heart disease Mother   . Hypertension Mother    Social History   Tobacco Use  . Smoking status: Former Smoker    Last attempt to quit: 08/09/1990    Years since quitting: 28.4  . Smokeless tobacco: Never Used  Substance Use Topics  . Alcohol use: No    Alcohol/week: 0.0 standard drinks  . Drug  use: No      Observations/Objective: Patient is alert and answers questions appropriately.  I am able to appreciate some conjunctival redness.  I do not see any drainage at this time.  He is normally conversive without shortness of breath.  His mood and affect are appropriate.  BP 118/68 Comment: per patient  Pulse 73 Comment: per patient  Wt 193 lb (87.5 kg) Comment: per patient  BMI 29.35 kg/m  Wt Readings from Last 3 Encounters:  01/05/19 193 lb (87.5 kg)  04/25/18 189 lb (85.7 kg)  01/16/18 190 lb 4 oz (86.3 kg)    Assessment and Plan: 1. Allergic conjunctivitis of both eyes -Discussed treatment with over-the-counter allergy eyedrops and continued use of lubricating eyedrops as needed.  Encouraged cool compresses several times a day. -He was instructed to follow-up if he develops any fever, visual change, pain or if no improvement in symptoms in the next 48 to 72 hours.  Patient verbalized understanding   Clarene Reamer, FNP-BC  [facility]  01/05/2019 11:48 AM   Follow Up Instructions: After visit summary printed and mailed to patient's home address.   I discussed the assessment and treatment plan with the patient. The patient was provided an opportunity to ask questions and all were answered. The patient agreed with the plan  and demonstrated an understanding of the instructions.   The patient was advised to call back or seek an in-person evaluation if the symptoms worsen or if the condition fails to improve as anticipated.   Elby Beck, FNP

## 2019-01-05 NOTE — Patient Instructions (Signed)
Hi Mr. James Pearson,  It was a pleasure to see you on Friday morning for your video visit.  I hope by the time you get these papers in the mail you are feeling much better.  If your symptoms worsen or are not getting better in a couple of days please contact the office. Typically the symptoms will improve with over-the-counter allergy eyedrops, cool compresses and artificial tears for comfort. Warm regards,  Tor Netters, FNP-BC   Allergic Conjunctivitis A clear membrane (conjunctiva) covers the white part of your eye and the inner surface of your eyelid. Allergic conjunctivitis happens when this membrane has inflammation. This is caused by allergies. Common causes of allergic reactions (allergens)include:  Outdoor allergens, such as: ? Pollen. ? Grass and weeds. ? Mold spores.  Indoor allergens, such as: ? Dust. ? Smoke. ? Mold. ? Pet dander. ? Animal hair. This condition can make your eye red or pink. It can also make your eye feel itchy. This condition cannot be spread from one person to another person (is not contagious). Follow these instructions at home:  Try not to be around things that you are allergic to.  Take or apply over-the-counter and prescription medicines only as told by your doctor. These include any eye drops.  Place a cool, clean washcloth on your eye for 10-20 minutes. Do this 3-4 times a day.  Do not touch or rub your eyes.  Do not wear contact lenses until the inflammation is gone. Wear glasses instead.  Do not wear eye makeup until the inflammation is gone.  Keep all follow-up visits as told by your doctor. This is important. Contact a doctor if:  Your symptoms get worse.  Your symptoms do not get better with treatment.  You have mild eye pain.  You are sensitive to light,  You have spots or blisters on your eyes.  You have pus coming from your eye.  You have a fever. Get help right away if:  You have redness, swelling, or other symptoms in  only one eye.  Your vision is blurry.  You have vision changes.  You have very bad eye pain. Summary  Allergic conjunctivitis is caused by allergies. It can make your eye red or pink, and it can make your eye feel itchy.  This condition cannot be spread from one person to another person (is not contagious).  Try not to be around things that you are allergic to.  Take or apply over-the-counter and prescription medicines only as told by your doctor. These include any eye drops.  Contact your doctor if your symptoms get worse or they do not get better with treatment. This information is not intended to replace advice given to you by your health care provider. Make sure you discuss any questions you have with your health care provider. Document Released: 01/13/2010 Document Revised: 03/19/2016 Document Reviewed: 03/19/2016 Elsevier Interactive Patient Education  [employer].

## 2019-01-18 ENCOUNTER — Encounter: Payer: Medicare PPO | Admitting: Internal Medicine

## 2019-01-22 ENCOUNTER — Ambulatory Visit (INDEPENDENT_AMBULATORY_CARE_PROVIDER_SITE_OTHER): Payer: Medicare PPO | Admitting: Internal Medicine

## 2019-01-22 ENCOUNTER — Encounter: Payer: Self-pay | Admitting: Internal Medicine

## 2019-01-22 ENCOUNTER — Other Ambulatory Visit: Payer: Self-pay

## 2019-01-22 VITALS — BP 136/74 | HR 68 | Temp 98.0°F | Ht 67.5 in | Wt 196.0 lb

## 2019-01-22 DIAGNOSIS — F5101 Primary insomnia: Secondary | ICD-10-CM

## 2019-01-22 DIAGNOSIS — E119 Type 2 diabetes mellitus without complications: Secondary | ICD-10-CM | POA: Diagnosis not present

## 2019-01-22 DIAGNOSIS — N400 Enlarged prostate without lower urinary tract symptoms: Secondary | ICD-10-CM | POA: Diagnosis not present

## 2019-01-22 DIAGNOSIS — Z0000 Encounter for general adult medical examination without abnormal findings: Secondary | ICD-10-CM | POA: Diagnosis not present

## 2019-01-22 DIAGNOSIS — I10 Essential (primary) hypertension: Secondary | ICD-10-CM | POA: Diagnosis not present

## 2019-01-22 DIAGNOSIS — K219 Gastro-esophageal reflux disease without esophagitis: Secondary | ICD-10-CM | POA: Diagnosis not present

## 2019-01-22 DIAGNOSIS — Z125 Encounter for screening for malignant neoplasm of prostate: Secondary | ICD-10-CM

## 2019-01-22 DIAGNOSIS — E7800 Pure hypercholesterolemia, unspecified: Secondary | ICD-10-CM | POA: Diagnosis not present

## 2019-01-22 DIAGNOSIS — M545 Low back pain: Secondary | ICD-10-CM

## 2019-01-22 DIAGNOSIS — G8929 Other chronic pain: Secondary | ICD-10-CM

## 2019-01-22 DIAGNOSIS — I639 Cerebral infarction, unspecified: Secondary | ICD-10-CM | POA: Diagnosis not present

## 2019-01-22 LAB — COMPREHENSIVE METABOLIC PANEL
ALT: 15 U/L (ref 0–53)
AST: 19 U/L (ref 0–37)
Albumin: 4.2 g/dL (ref 3.5–5.2)
Alkaline Phosphatase: 104 U/L (ref 39–117)
BUN: 10 mg/dL (ref 6–23)
CO2: 33 mEq/L — ABNORMAL HIGH (ref 19–32)
Calcium: 9.5 mg/dL (ref 8.4–10.5)
Chloride: 99 mEq/L (ref 96–112)
Creatinine, Ser: 1.2 mg/dL (ref 0.40–1.50)
GFR: 72.08 mL/min (ref >60.00)
Glucose, Bld: 109 mg/dL — ABNORMAL HIGH (ref 70–99)
Potassium: 4 mEq/L (ref 3.5–5.1)
Sodium: 139 mEq/L (ref 135–145)
Total Bilirubin: 0.9 mg/dL (ref 0.2–1.2)
Total Protein: 7.5 g/dL (ref 6.0–8.3)

## 2019-01-22 LAB — CBC
HCT: 47 % (ref 39.0–52.0)
Hemoglobin: 15.2 g/dL (ref 13.0–17.0)
MCHC: 32.3 g/dL (ref 30.0–36.0)
MCV: 78.2 fl (ref 78.0–100.0)
Platelets: 189 10*3/uL (ref 150.0–400.0)
RBC: 6 Mil/uL — ABNORMAL HIGH (ref 4.22–5.81)
RDW: 18.2 % — ABNORMAL HIGH (ref 11.5–15.5)
WBC: 6.3 10*3/uL (ref 4.0–10.5)

## 2019-01-22 LAB — LIPID PANEL
Cholesterol: 201 mg/dL — ABNORMAL HIGH (ref 0–200)
HDL: 57.6 mg/dL (ref >39.00)
LDL Cholesterol: 121 mg/dL — ABNORMAL HIGH (ref 0–99)
NonHDL: 143.77
Total CHOL/HDL Ratio: 3
Triglycerides: 115 mg/dL (ref 0.0–149.0)
VLDL: 23 mg/dL (ref 0.0–40.0)

## 2019-01-22 LAB — PSA, MEDICARE: PSA: 1.83 ng/ml (ref 0.10–4.00)

## 2019-01-22 LAB — HEMOGLOBIN A1C: Hgb A1c MFr Bld: 6.8 % — ABNORMAL HIGH (ref 4.6–6.5)

## 2019-01-22 MED ORDER — TRAZODONE HCL 50 MG PO TABS: 50.0000 mg | ORAL_TABLET | Freq: Every evening | ORAL | 3 refills | Status: DC | PRN

## 2019-01-22 NOTE — Patient Instructions (Signed)
Health Maintenance After Age 72 After age 72, you are at a higher risk for certain long-term diseases and infections as well as injuries from falls. Falls are a major cause of broken bones and head injuries in people who are older than age 72. Getting regular preventive care can help to keep you healthy and well. Preventive care includes getting regular testing and making lifestyle changes as recommended by your health care provider. Talk with your health care provider about:  Which screenings and tests you should have. A screening is a test that checks for a disease when you have no symptoms.  A diet and exercise plan that is right for you. What should I know about screenings and tests to prevent falls? Screening and testing are the best ways to find a health problem early. Early diagnosis and treatment give you the best chance of managing medical conditions that are common after age 72. Certain conditions and lifestyle choices may make you more likely to have a fall. Your health care provider may recommend:  Regular vision checks. Poor vision and conditions such as cataracts can make you more likely to have a fall. If you wear glasses, make sure to get your prescription updated if your vision changes.  Medicine review. Work with your health care provider to regularly review all of the medicines you are taking, including over-the-counter medicines. Ask your health care provider about any side effects that may make you more likely to have a fall. Tell your health care provider if any medicines that you take make you feel dizzy or sleepy.  Osteoporosis screening. Osteoporosis is a condition that causes the bones to get weaker. This can make the bones weak and cause them to break more easily.  Blood pressure screening. Blood pressure changes and medicines to control blood pressure can make you feel dizzy.  Strength and balance checks. Your health care provider may recommend certain tests to check your  strength and balance while standing, walking, or changing positions.  Foot health exam. Foot pain and numbness, as well as not wearing proper footwear, can make you more likely to have a fall.  Depression screening. You may be more likely to have a fall if you have a fear of falling, feel emotionally low, or feel unable to do activities that you used to do.  Alcohol use screening. Using too much alcohol can affect your balance and may make you more likely to have a fall. What actions can I take to lower my risk of falls? General instructions  Talk with your health care provider about your risks for falling. Tell your health care provider if: ? You fall. Be sure to tell your health care provider about all falls, even ones that seem minor. ? You feel dizzy, sleepy, or off-balance.  Take over-the-counter and prescription medicines only as told by your health care provider. These include any supplements.  Eat a healthy diet and maintain a healthy weight. A healthy diet includes low-fat dairy products, low-fat (lean) meats, and fiber from whole grains, beans, and lots of fruits and vegetables. Home safety  Remove any tripping hazards, such as rugs, cords, and clutter.  Install safety equipment such as grab bars in bathrooms and safety rails on stairs.  Keep rooms and walkways well-lit. Activity   Follow a regular exercise program to stay fit. This will help you maintain your balance. Ask your health care provider what types of exercise are appropriate for you.  If you need a cane or   walker, use it as recommended by your health care provider.  Wear supportive shoes that have nonskid soles. Lifestyle  Do not drink alcohol if your health care provider tells you not to drink.  If you drink alcohol, limit how much you have: ? 0-1 drink a day for women. ? 0-2 drinks a day for men.  Be aware of how much alcohol is in your drink. In the [place] one drink equals one typical bottle of beer (12  oz), one-half glass of wine (5 oz), or one shot of hard liquor (1 oz).  Do not use any products that contain nicotine or tobacco, such as cigarettes and e-cigarettes. If you need help quitting, ask your health care provider. Summary  Having a healthy lifestyle and getting preventive care can help to protect your health and wellness after age 72.  Screening and testing are the best way to find a health problem early and help you avoid having a fall. Early diagnosis and treatment give you the best chance for managing medical conditions that are more common for people who are older than age 72.  Falls are a major cause of broken bones and head injuries in people who are older than age 72. Take precautions to prevent a fall at home.  Work with your health care provider to learn what changes you can make to improve your health and wellness and to prevent falls. This information is not intended to replace advice given to you by your health care provider. Make sure you discuss any questions you have with your health care provider. Document Released: 06/08/2017 Document Revised: 06/08/2017 Document Reviewed: 06/08/2017 Elsevier Interactive Patient Education  2019 [employer]vier Inc.  

## 2019-01-22 NOTE — Assessment & Plan Note (Signed)
Continue Trazadone, refilled today Will monitor

## 2019-01-22 NOTE — Assessment & Plan Note (Signed)
Controlled  On Amlodipine, Enalapril and HCTZ Reinforced DASH diet and exercise for weight loss CBC and CMET today

## 2019-01-22 NOTE — Progress Notes (Signed)
HPI:  Pt presents to the clinic today for his subsequent Annual Medicare Wellness Exam. He is also due to follow up chronic conditions.  HTN: His BP today is 136/74. He is taking Amlodipine, Enalapril and HCTZ as prescribed. ECG from 01/2018 reviewed.  HLD: His last LDL was 99, 10/2017. He is not taking Atorvastatin as prescribed, not consistent. He tries to consume a low fat diet.  DM 2: His last A1C was 6.9%, 10/2017. He is not taking any oral diabetic medication at this time. He does not check his sugars. He does not routinely check his feet. His last eye exam was 12/2018 at [facility]. Pneumovax 10/2014. Prevnar 10/2013. Flu 04/2016.   History of Stroke: No residual effect. He is taking Amlodipine, Enalapril, HCTZ and ASA as prescribed. He is not taking his Atorvastatin.  BPH: He voids fine on Flomax. He does have issues with nocturia. He is not following with urology.   GERD: Triggered by spicy and greasy foods. He takes Ranitidine as prescribed. He denies breakthrough symptoms. There is no upper GI on file.  Insomnia: He has trouble falling and staying asleep. He takes Trazadone as needed with good relief. There is no sleep study on file.  Chronic Back Pain: Managed with Oxycodone, prescribed by the [place].  Past Medical History:  Diagnosis Date  . Arthritis   . Hepatitis   . Hypertension   . 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 Stroke [facility]) 2013    Current Outpatient Medications  Medication Sig Dispense Refill  . amLODipine (NORVASC) 10 MG tablet Take 1 tablet by mouth daily.    Marland Kitchen aspirin 81 MG chewable tablet Chew 81 mg by mouth daily.    Marland Kitchen atorvastatin (LIPITOR) 80 MG tablet Take 1 tablet (80 mg total) by mouth daily. 90 tablet 1  . citalopram (CELEXA) 10 MG tablet Take 1 tablet (10 mg total) by mouth daily. 30 tablet 5  . enalapril (VASOTEC) 10 MG tablet Take 1 tablet (10 mg total) by mouth 2 (two) times daily. 14 tablet 0  . hydrochlorothiazide (HYDRODIURIL) 25 MG tablet Take 1 tablet (25  mg total) by mouth daily. 90 tablet 1  . Misc Natural Products (PROSTATE HEALTH PO) Take 1 tablet by mouth daily.    [name] 3-Lutein-Zeaxanthin (ADVANCED EYE HEALTH PO) Take 1 tablet by mouth daily. Two times a week    . oxyCODONE (OXY IR/ROXICODONE) 5 MG immediate release tablet Take 5 mg by mouth every 4 (four) hours as needed for severe pain.    . Saw Palmetto-Pumpkin Seed Oil 160-100 MG CAPS Take 2 capsules by mouth daily.    . sildenafil (VIAGRA) 100 MG tablet Take 50-100 mg by mouth daily as needed for erectile dysfunction.    Marland Kitchen Specialty Vitamins Products (PROSTATE PO) Take by mouth daily.    . tamsulosin (FLOMAX) 0.4 MG CAPS capsule TAKE ONE (1) CAPSULE BY MOUTH EACH DAY (Patient taking differently: 0.8 mg. Takes 2 daily) 7 capsule 0  . traZODone (DESYREL) 50 MG tablet TAKE 1 TABLET BY MOUTH AT BEDTIME AS NEEDED FOR SLEEP 90 tablet 0  . TURMERIC PO Take by mouth daily.     No current facility-administered medications for this visit.     Allergies  Allergen Reactions  . Omeprazole Anaphylaxis  . Nsaids Nausea And Vomiting    Family History  Problem Relation Age of Onset  . Heart disease Mother   . Hypertension Mother     Social History  Socioeconomic History  . Marital status: Married    Spouse name: Not on file  . Number of children: Not on file  . Years of education: Not on file  . Highest education level: Not on file  Occupational History  . Not on file  Social Needs  . Financial resource strain: Not on file  . Food insecurity    Worry: Not on file    Inability: Not on file  . Transportation needs    Medical: Not on file    Non-medical: Not on file  Tobacco Use  . Smoking status: Former Smoker    Quit date: 08/09/1990    Years since quitting: 28.4  . Smokeless tobacco: Never Used  Substance and Sexual Activity  . Alcohol use: No    Alcohol/week: 0.0 standard drinks  . Drug use: No  . Sexual activity: Yes  Lifestyle  . Physical activity    Days per  week: Not on file    Minutes per session: Not on file  . Stress: Not on file  Relationships  . Social Herbalist on phone: Not on file    Gets together: Not on file    Attends religious service: Not on file    Active member of club or organization: Not on file    Attends meetings of clubs or organizations: Not on file    Relationship status: Not on file  . Intimate partner violence    Fear of current or ex partner: Not on file    Emotionally abused: Not on file    Physically abused: Not on file    Forced sexual activity: Not on file  Other Topics Concern  . Not on file  Social History Narrative  . Not on file    Hospitiliaztions: None  Health Maintenance:    Flu: 05/2018  Tetanus: < 10 years ago  Pneumovax: 10/2014  Prevnar: 10/2013  Zostavax: 06/2014  Shingrix: 08/2018  PSA: 10/2017  Colon Screening: 06/2018  Eye Doctor: annually  Dental Exam: as needed   Providers:   PCP: Webb Silversmith, NP-C  VA: Dr. Beverely Risen   I have personally reviewed and have noted:  1. The patient's medical and social history 2. Their use of alcohol, tobacco or illicit drugs 3. Their current medications and supplements 4. The patient's functional ability including ADL's, fall risks, home safety risks and hearing or visual impairment. 5. Diet and physical activities 6. Evidence for depression or mood disorder  Subjective:   Review of Systems:   Constitutional: Denies fever, malaise, fatigue, headache or abrupt weight changes.  HEENT: Denies eye pain, eye redness, ear pain, ringing in the ears, wax buildup, runny nose, nasal congestion, bloody nose, or sore throat. Respiratory: Denies difficulty breathing, shortness of breath, cough or sputum production.   Cardiovascular: Denies chest pain, chest tightness, palpitations or swelling in the hands or feet.  Gastrointestinal: Denies abdominal pain, bloating, constipation, diarrhea or blood in the stool.  GU: Denies urgency, frequency,  pain with urination, burning sensation, blood in urine, odor or discharge. Musculoskeletal: Pt reports chronic back pain. Denies decrease in range of motion, difficulty with gait, muscle pain or joint swelling.  Skin: Denies redness, rashes, lesions or ulcercations.  Neurological: Denies dizziness, difficulty with memory, difficulty with speech or problems with balance and coordination.  Psych: Denies anxiety, depression, SI/HI.  No other specific complaints in a complete review of systems (except as listed in HPI above).  Objective:  PE:   BP 136/74  Pulse 68   Temp 98 F (36.7 C) (Oral)   Ht 5' 7.5" (1.715 m)   Wt 196 lb (88.9 kg)   SpO2 98%   BMI 30.24 kg/m   Wt Readings from Last 3 Encounters:  01/05/19 193 lb (87.5 kg)  04/25/18 189 lb (85.7 kg)  01/16/18 190 lb 4 oz (86.3 kg)    General: Appears his stated age, well developed, well nourished in NAD. Skin: Warm, dry and intact. No rashes noted. HEENT: Head: normal shape and size; Eyes: sclera white, no icterus, conjunctiva pink, PERRLA and EOMs intact; Ears: Tm's gray and intact, normal light reflex; Neck: Neck supple, trachea midline. No masses, lumps or thyromegaly present.  Cardiovascular: Normal rate and rhythm. S1,S2 noted.  No murmur, rubs or gallops noted. No JVD or BLE edema. No carotid bruits noted. Pulmonary/Chest: Normal effort and positive vesicular breath sounds. No respiratory distress. No wheezes, rales or ronchi noted.  Abdomen: Soft and nontender. Normal bowel sounds. No distention or masses noted. Liver, spleen and kidneys non palpable. Musculoskeletal:  Strength 5/5 BUE/BLE. No signs of joint swelling.  Neurological: Alert and oriented. Cranial nerves II-XII grossly intact. Coordination normal.  Psychiatric: Mood and affect normal. Behavior is normal. Judgment and thought content normal.     BMET    Component Value Date/Time   K 3.4 10/24/2017   CREATININE 1.2 10/24/2017    Lipid Panel      Component Value Date/Time   CHOL 183 10/24/2017   TRIG 124 10/24/2017   HDL 65 10/24/2017   LDLCALC 99 10/24/2017    CBC No results found for: WBC, RBC, HGB, HCT, PLT, MCV, MCH, MCHC, RDW, LYMPHSABS, MONOABS, EOSABS, BASOSABS  Hgb A1C Lab Results  Component Value Date   HGBA1C 6.9 10/24/2017      Assessment and Plan:   Medicare Annual Wellness Visit:  Diet: He does eat lean meat. He does not eat fruits and veggies consistently. He does eat some fried food. He drinks mostly juice, water, coffee. Physical activity: Never Depression/mood screen: Negative, PHQ 9 score of 1. Hearing: Intact to whispered voice Visual acuity: Grossly normal, performs annual eye exam  ADLs: Capable Fall risk: None Home safety: Good Cognitive evaluation: Intact to orientation, naming, recall and repetition EOL planning: No adv directives, full code/ I agree  Preventative Medicine: Flu, tetanus, pneumovax, prevnar, zostovax and shingrix UTD. Colon Screening UTD. Encouraged him to consume a balanced diet and exercise regimen. Advised her to see an eye doctor and dentist annually. Will check CBC, CMET, Lipid, A1C and PSA today.   Next appointment: 6 months, follow up chronic conditions.   Webb Silversmith, NP

## 2019-01-22 NOTE — Assessment & Plan Note (Signed)
Continue Flomax Will monitor 

## 2019-01-22 NOTE — Assessment & Plan Note (Signed)
Continue Amlodipine, Enalapril, HCTZ and ASA Will monitor

## 2019-01-22 NOTE — Assessment & Plan Note (Signed)
A1C today No microalbumin secondary to ACEI therapy Encouraged him to consume a low carb diet and exercise for weight loss Foot exam today Encouraged routine eye exam Flu, pneumovax and prevnar UTD

## 2019-01-22 NOTE — Assessment & Plan Note (Signed)
Discussed regular stretching and core strengthening Continue Oxycodone per [place]

## 2019-01-22 NOTE — Assessment & Plan Note (Signed)
CMET and Lipid profile today Encouraged him to consume a low fat diet Encouraged him to take the Atorvastatin as prescribed.

## 2019-01-22 NOTE — Assessment & Plan Note (Signed)
Continue Ranitidine prn CBC and CMET today

## 2019-01-26 ENCOUNTER — Telehealth: Payer: Self-pay

## 2019-01-26 MED ORDER — ATORVASTATIN CALCIUM 80 MG PO TABS: 80.0000 mg | ORAL_TABLET | Freq: Every day | ORAL | 1 refills | Status: DC

## 2019-01-26 NOTE — Telephone Encounter (Signed)
[place] Night - Client Nonclinical Telephone Record AccessNurse Client [facility] Night - Client Client Site [place] Physician Webb Silversmith - NP Contact Type Call Who Is Calling Patient / Member / Family / Caregiver Caller Name [name] Caller Phone Number [phone] Patient Name James Pearson Patient DOB 1946/07/10 Call Type Message Only Information Provided Reason for Call Request to Schedule Office Appointment Initial Comment Caller stated he needs a new prescription on his atorvastatin, send to [place], [phone] Additional Comment Call Closed By: Cherlynn Perches Transaction Date/Time: 01/25/2019 5:11:26 PM (ET)

## 2019-01-26 NOTE — Telephone Encounter (Signed)
Rx sent through e-scribe  

## 2019-02-01 ENCOUNTER — Telehealth: Payer: Self-pay

## 2019-02-01 NOTE — Telephone Encounter (Signed)
Pt request lab results mailed to pt; pt request cb.

## 2019-02-01 NOTE — Telephone Encounter (Signed)
It was mailed January 25, 2019

## 2019-06-23 DIAGNOSIS — M545 Low back pain: Secondary | ICD-10-CM | POA: Diagnosis not present

## 2019-06-23 DIAGNOSIS — I10 Essential (primary) hypertension: Secondary | ICD-10-CM | POA: Diagnosis not present

## 2019-06-23 DIAGNOSIS — N400 Enlarged prostate without lower urinary tract symptoms: Secondary | ICD-10-CM | POA: Diagnosis not present

## 2019-06-23 DIAGNOSIS — K219 Gastro-esophageal reflux disease without esophagitis: Secondary | ICD-10-CM | POA: Diagnosis not present

## 2019-06-23 DIAGNOSIS — Z6828 Body mass index (BMI) 28.0-28.9, adult: Secondary | ICD-10-CM | POA: Diagnosis not present

## 2019-06-23 DIAGNOSIS — E785 Hyperlipidemia, unspecified: Secondary | ICD-10-CM | POA: Diagnosis not present

## 2019-06-23 DIAGNOSIS — I739 Peripheral vascular disease, unspecified: Secondary | ICD-10-CM | POA: Diagnosis not present

## 2019-06-23 DIAGNOSIS — G4700 Insomnia, unspecified: Secondary | ICD-10-CM | POA: Diagnosis not present

## 2019-07-16 ENCOUNTER — Telehealth: Payer: Self-pay | Admitting: Internal Medicine

## 2019-07-16 MED ORDER — ENALAPRIL MALEATE 10 MG PO TABS: 10.0000 mg | ORAL_TABLET | Freq: Two times a day (BID) | ORAL | 0 refills | Status: DC

## 2019-07-16 NOTE — Telephone Encounter (Signed)
Pt came in to request a refill on his Enalapril to be sent to [facility] in [place].

## 2019-07-16 NOTE — Telephone Encounter (Signed)
Rx sent through e-scribe  

## 2019-07-16 NOTE — Addendum Note (Signed)
Addended by: Lurlean Nanny on: 07/16/2019 04:22 PM   Modules accepted: Orders

## 2019-08-24 ENCOUNTER — Other Ambulatory Visit: Payer: Self-pay | Admitting: Internal Medicine

## 2019-09-04 LAB — HEMOGLOBIN A1C: A1c: 7.1

## 2019-10-15 ENCOUNTER — Telehealth: Payer: Self-pay | Admitting: Internal Medicine

## 2019-10-15 NOTE — Telephone Encounter (Signed)
Pt dropped off form to be filled out for parking placard. Placed in [place] tower.   He is also requesting 2 referrals. One for dermatology for his scalp and another referral for his knee. He states this has been discussed previously.

## 2019-10-15 NOTE — Telephone Encounter (Signed)
Pt needs OV as I do not see theses things mentioned in his last OV 01/2019 for AWV... We have to have an up to date note at least in the past year and evaluation to support the referral. Schedule 30 minutes for multiple issues. If there any issues, let me know.Marland KitchenMarland KitchenMarland Kitchen

## 2019-10-22 ENCOUNTER — Other Ambulatory Visit: Payer: Self-pay

## 2019-10-22 ENCOUNTER — Ambulatory Visit (INDEPENDENT_AMBULATORY_CARE_PROVIDER_SITE_OTHER)
Admission: RE | Admit: 2019-10-22 | Discharge: 2019-10-22 | Disposition: A | Discharge status: Home / Self Care | Payer: Medicare HMO | Source: Ambulatory Visit | Attending: Internal Medicine | Admitting: Internal Medicine

## 2019-10-22 ENCOUNTER — Ambulatory Visit (INDEPENDENT_AMBULATORY_CARE_PROVIDER_SITE_OTHER): Payer: Medicare HMO | Admitting: Internal Medicine

## 2019-10-22 ENCOUNTER — Encounter: Payer: Self-pay | Admitting: Internal Medicine

## 2019-10-22 VITALS — BP 132/68 | HR 85 | Temp 98.5°F | Wt 196.0 lb

## 2019-10-22 DIAGNOSIS — G8929 Other chronic pain: Secondary | ICD-10-CM

## 2019-10-22 DIAGNOSIS — M25361 Other instability, right knee: Secondary | ICD-10-CM

## 2019-10-22 DIAGNOSIS — R21 Rash and other nonspecific skin eruption: Secondary | ICD-10-CM

## 2019-10-22 DIAGNOSIS — M25561 Pain in right knee: Secondary | ICD-10-CM

## 2019-10-22 NOTE — Progress Notes (Signed)
Subjective:    Patient ID: James Pearson, male    DOB: 06-19-1946, 74 y.o.   MRN: [id number]  HPI  Pt presents to the clinic today requesting referral to dermatology and orthopedics. He reports rash of scalp. He noticed this 1 year ago. The rash does not itch or burn. It is scaly. He puts oil/shine products in his hair but has not tried any medications OTC. He reports the right knee has been an ongoing issue. He reports there isn't much pain but feels like the right knee is going to give out on him, espescially walking down hills or walking down stairs. He reports some associated swelling of the RLE but denies numbness or tingling. He is unable to take NSAID's OTC due to GI upset.  Review of Systems      Past Medical History:  Diagnosis Date  . Arthritis   . Hepatitis   . Hypertension   . 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 Stroke [facility]) 2013    Current Outpatient Medications  Medication Sig Dispense Refill  . amLODipine (NORVASC) 10 MG tablet Take 1 tablet by mouth daily.    . APPLE CIDER VINEGAR PO Take by mouth.    Marland Kitchen aspirin 81 MG chewable tablet Chew 81 mg by mouth daily.    Marland Kitchen atorvastatin (LIPITOR) 80 MG tablet Take 1 tablet (80 mg total) by mouth daily. 90 tablet 1  . enalapril (VASOTEC) 10 MG tablet TAKE 1 TABLET BY MOUTH TWICE A DAY 90 tablet 0  . hydrochlorothiazide (HYDRODIURIL) 25 MG tablet Take 1 tablet (25 mg total) by mouth daily. 90 tablet 1  . Magnesium 400 MG CAPS Take by mouth.    . Misc Natural Products (PROSTATE HEALTH PO) Take 1 tablet by mouth daily.    [name] 3-Lutein-Zeaxanthin (ADVANCED EYE HEALTH PO) Take 1 tablet by mouth daily. Two times a week    . oxyCODONE (OXY IR/ROXICODONE) 5 MG immediate release tablet Take 5 mg by mouth every 4 (four) hours as needed for severe pain.    . Saw Palmetto-Pumpkin Seed Oil 160-100 MG CAPS Take 2 capsules by mouth daily.    . sildenafil (VIAGRA) 100 MG tablet Take 50-100 mg by mouth daily as needed for erectile  dysfunction.    Marland Kitchen Specialty Vitamins Products (PROSTATE PO) Take by mouth daily.    . tamsulosin (FLOMAX) 0.4 MG CAPS capsule TAKE ONE (1) CAPSULE BY MOUTH EACH DAY (Patient taking differently: 0.8 mg. Takes 2 daily) 7 capsule 0  . tobramycin-dexamethasone (TOBRADEX) ophthalmic solution every 4 (four) hours while awake.    . traZODone (DESYREL) 50 MG tablet Take 1 tablet (50 mg total) by mouth at bedtime as needed. for sleep 90 tablet 3  . TURMERIC PO Take by mouth daily.     No current facility-administered medications for this visit.    Allergies  Allergen Reactions  . Omeprazole Anaphylaxis  . Nsaids Nausea And Vomiting    Family History  Problem Relation Age of Onset  . Heart disease Mother   . Hypertension Mother     Social History   Socioeconomic History  . Marital status: Married    Spouse name: Not on file  . Number of children: Not on file  . Years of education: Not on file  . Highest education level: Not on file  Occupational History  . Not on file  Tobacco Use  . Smoking status: Former Smoker    Quit date: 08/09/1990  Years since quitting: 29.2  . Smokeless tobacco: Never Used  Substance and Sexual Activity  . Alcohol use: No    Alcohol/week: 0.0 standard drinks  . Drug use: No  . Sexual activity: Yes  Other Topics Concern  . Not on file  Social History Narrative  . Not on file   Social Determinants of Health   Financial Resource Strain:   . Difficulty of Paying Living Expenses:   Food Insecurity:   . Worried About Charity fundraiser in the Last Year:   . Arboriculturist in the Last Year:   Transportation Needs:   . Film/video editor (Medical):   Marland Kitchen Lack of Transportation (Non-Medical):   Physical Activity:   . Days of Exercise per Week:   . Minutes of Exercise per Session:   Stress:   . Feeling of Stress :   Social Connections:   . Frequency of Communication with Friends and Family:   . Frequency of Social Gatherings with Friends and  Family:   . Attends Religious Services:   . Active Member of Clubs or Organizations:   . Attends Archivist Meetings:   Marland Kitchen Marital Status:   Intimate Partner Violence:   . Fear of Current or Ex-Partner:   . Emotionally Abused:   Marland Kitchen Physically Abused:   . Sexually Abused:      Constitutional: Denies fever, malaise, fatigue, headache or abrupt weight changes.  Musculoskeletal: Pt reports right knee pain/instability. Denies decrease in range of motion, difficulty with gait, muscle pain or joint swelling.  Skin: Pt reports rash of scalp. Denies redness, esions or ulcercations.  Neurological: Denies dizziness, difficulty with memory, difficulty with speech or problems with balance and coordination.  Psych: Denies anxiety, depression, SI/HI.  No other specific complaints in a complete review of systems (except as listed in HPI above).  Objective:   Physical Exam  BP 132/68   Pulse 85   Temp 98.5 F (36.9 C) (Temporal)   Wt 196 lb (88.9 kg)   SpO2 98%   BMI 30.24 kg/m   Wt Readings from Last 3 Encounters:  01/22/19 196 lb (88.9 kg)  01/05/19 193 lb (87.5 kg)  04/25/18 189 lb (85.7 kg)    General: Appears his stated age, well developed, well nourished in NAD. Skin: Warm, dry and intact. Flakiness noted of scalp. Cardiovascular: Normal rate and rhythm.  Pulmonary/Chest: Normal effort and positive vesicular breath sounds. No respiratory distress. No wheezes, rales or ronchi noted.  Musculoskeletal: Normal flexion, extension of the right knee. Swelling noted medially. No pain with palpation of the right knee. Negative Lachman's. Negative McMurray. No difficulty with gait.  Neurological: Alert and oriented.  BMET    Component Value Date/Time   NA 139 01/22/2019 1318   K 4.0 01/22/2019 1318   CL 99 01/22/2019 1318   CO2 33 (H) 01/22/2019 1318   GLUCOSE 109 (H) 01/22/2019 1318   BUN 10 01/22/2019 1318   CREATININE 1.20 01/22/2019 1318   CALCIUM 9.5 01/22/2019 1318     Lipid Panel     Component Value Date/Time   CHOL 201 (H) 01/22/2019 1318   TRIG 115.0 01/22/2019 1318   HDL 57.60 01/22/2019 1318   CHOLHDL 3 01/22/2019 1318   VLDL 23.0 01/22/2019 1318   LDLCALC 121 (H) 01/22/2019 1318    CBC    Component Value Date/Time   WBC 6.3 01/22/2019 1318   RBC 6.00 (H) 01/22/2019 1318   HGB 15.2 01/22/2019 1318  HCT 47.0 01/22/2019 1318   PLT 189.0 01/22/2019 1318   MCV 78.2 01/22/2019 1318   MCHC 32.3 01/22/2019 1318   RDW 18.2 (H) 01/22/2019 1318    Hgb A1C Lab Results  Component Value Date   HGBA1C 6.8 (H) 01/22/2019          Assessment & Plan:  Rash of Scalp:  Referral to derm per pt request  Right Knee Pain, Instability:  Xray right knee today Referral to ortho placed  Webb Silversmith, NP This visit occurred during the SARS-CoV-2 public health emergency.  Safety protocols were in place, including screening questions prior to the visit, additional usage of staff PPE, and extensive cleaning of exam room while observing appropriate contact time as indicated for disinfecting solutions.

## 2019-10-22 NOTE — Patient Instructions (Signed)

## 2019-10-30 ENCOUNTER — Ambulatory Visit: Payer: Medicare HMO | Admitting: Dermatology

## 2019-10-30 ENCOUNTER — Ambulatory Visit: Payer: Medicare HMO | Admitting: Orthopaedic Surgery

## 2019-10-30 ENCOUNTER — Encounter: Payer: Self-pay | Admitting: Dermatology

## 2019-10-30 ENCOUNTER — Other Ambulatory Visit: Payer: Self-pay

## 2019-10-30 ENCOUNTER — Encounter: Payer: Self-pay | Admitting: Orthopaedic Surgery

## 2019-10-30 DIAGNOSIS — L219 Seborrheic dermatitis, unspecified: Secondary | ICD-10-CM | POA: Diagnosis not present

## 2019-10-30 DIAGNOSIS — M1711 Unilateral primary osteoarthritis, right knee: Secondary | ICD-10-CM | POA: Insufficient documentation

## 2019-10-30 MED ORDER — KETOCONAZOLE 2 % EX SHAM: MEDICATED_SHAMPOO | CUTANEOUS | 2 refills | Status: DC

## 2019-10-30 MED ORDER — METHYLPREDNISOLONE ACETATE 40 MG/ML IJ SUSP
80.0000 mg | INTRAMUSCULAR | Status: AC | PRN
  Administered 2019-10-30: 80 mg via INTRA_ARTICULAR

## 2019-10-30 MED ORDER — FLUOCINOLONE ACETONIDE BODY 0.01 % EX OIL: TOPICAL_OIL | CUTANEOUS | 5 refills | Status: AC

## 2019-10-30 MED ORDER — LIDOCAINE HCL 1 % IJ SOLN
2.0000 mL | INTRAMUSCULAR | Status: AC | PRN
  Administered 2019-10-30: 2 mL

## 2019-10-30 MED ORDER — BUPIVACAINE HCL 0.5 % IJ SOLN
2.0000 mL | INTRAMUSCULAR | Status: AC | PRN
  Administered 2019-10-30: 2 mL via INTRA_ARTICULAR

## 2019-10-30 MED ORDER — CLOBETASOL PROPIONATE 0.05 % EX SOLN: 1.0000 "application " | Freq: Two times a day (BID) | CUTANEOUS | 0 refills | Status: DC

## 2019-10-30 NOTE — Progress Notes (Signed)
   New Patient Visit  Subjective  Levern Aceto is a 74 y.o. male who presents for the following: New Patient (Initial Visit) (Referred by [facility] for rash on head. X 4 days, on and off for past 2 years).  Has scaly scalp- bad dandruff.  Looks like ringworm when it flares.  Also gets scaly eyebrows.    Objective  Well appearing patient in no apparent distress; mood and affect are within normal limits.  A focused examination was performed including head, including the scalp, face, neck, nose, ears, eyelids, and lips. Relevant physical exam findings are noted in the Assessment and Plan.  Objective  Left scalp, bilateral ears:  Scaly patches on L.Parietal scalp, b/l ears canals  Assessment & Plan  Seborrheic dermatitis Left scalp, bilateral ears  Will start Dermasmooth oil to scalp QD to BID. Can use on face and ears Will start ketoconazole shampoo three times week, let sit several minutes prior to rinsing. Will start Clobetasol solution BID PRN itchy rash scalp   Fluocinolone Acetonide Body 0.01 % OIL - Left scalp, bilateral ears  ketoconazole (NIZORAL) 2 % shampoo - Left scalp, bilateral ears  clobetasol (TEMOVATE) 0.05 % external solution - Left scalp, bilateral ears  Return in about 8 weeks (around 12/25/2019).    Marene Lenz, CMA, am acting as scribe for Brendolyn Patty, MD .

## 2019-10-30 NOTE — Progress Notes (Signed)
Office Visit Note   Patient: James Pearson           Date of Birth: 1946/02/11           MRN: [id number] Visit Date: 10/30/2019              Requested by: Jearld Fenton, NP [address] [place],  [place] [address] PCP: Jearld Fenton, NP   Assessment & Plan: Visit Diagnoses:  1. Unilateral primary osteoarthritis, right knee     Plan: Mr. James Pearson has moderate amount of arthritis in his right knee.  I reviewed the films he had performed through his primary care physician's office earlier in the month that demonstrated tricompartmental degenerative changes with narrowing of the joint space and some peripheral osteophytes.  Medially there may be some sclerosis in the subchondral bone.  I aspirated the knee of 28 cc of clear fluid injected cortisone he felt much better.  Long discussion regarding his arthritis and what may happen over time.  I discussed the various treatment options including knee replacement.  He is not ready for that yet. He has had an issue with GI intolerance of NSAIDs and so is somewhat limited in his medicine.  Certainly can try Tylenol.  We will just monitor his course and see him back over the next 3 to 4 weeks if still having a problem.  Follow-Up Instructions: Return if symptoms worsen or fail to improve.   Orders:  Orders Placed This Encounter  Procedures  . Large Joint Inj: R knee   No orders of the defined types were placed in this encounter.     Procedures: Large Joint Inj: R knee on 10/30/2019 4:18 PM Indications: pain and diagnostic evaluation Details: 25 G 1.5 in needle, medial approach  Arthrogram: No  Medications: 2 mL lidocaine 1 %; 2 mL bupivacaine 0.5 %; 80 mg methylPREDNISolone acetate 40 MG/ML Aspirate: 28 mL clear Outcome: tolerated well, no immediate complications Procedure, treatment alternatives, risks and benefits explained, specific risks discussed. Consent was given by the patient. Immediately prior to procedure a time out  was called to verify the correct patient, procedure, equipment, support staff and site/side marked as required. Patient was prepped and draped in the usual sterile fashion.       Clinical Data: No additional findings.   Subjective: Chief Complaint  Patient presents with  . Right Knee - Pain  Patient presents today for right knee pain. He said that it has been hurting for at least 2 years. No known injury. He said that he has to sleep with a pillow between his knees at night. He walks for exercise, but has not been able to in the last two weeks. Walking up or down hills makes it worse. He said that it feels like it will buckle at times. He states that it pops often and swells. No grinding. 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  Is have a chronic problem with his back but, as mentioned above more recent issue with his right knee which compromises his activities.  He has some swelling in his knee that makes it feel "tight".  No groin or thigh pain  HPI  Review of Systems  Constitutional: Negative for fatigue.  HENT: Negative for ear pain.   Eyes: Negative for pain.  Respiratory: Negative for shortness of breath.   Cardiovascular: Negative for leg swelling.  Gastrointestinal:  Negative for constipation and diarrhea.  Endocrine: Positive for cold intolerance. Negative for heat intolerance.  Genitourinary: Negative for difficulty urinating.  Musculoskeletal: Negative for joint swelling.  Skin: Positive for rash.  Allergic/Immunologic: Negative for food allergies.  Neurological: Positive for weakness.  Hematological: Does not bruise/bleed easily.  Psychiatric/Behavioral: Positive for sleep disturbance.     Objective: Vital Signs: Ht 5\' 8"  (1.727 m)   Wt 185 lb (83.9 kg)   BMI 28.13 kg/m   Physical Exam Constitutional:      Appearance: He is well-developed.  Eyes:     Pupils: Pupils are equal,  round, and reactive to light.  Pulmonary:     Effort: Pulmonary effort is normal.  Skin:    General: Skin is warm and dry.  Neurological:     Mental Status: He is alert and oriented to person, place, and time.  Psychiatric:        Behavior: Behavior normal.     Ortho Exam straight leg raise negative bilaterally.  Full extension right knee with a small effusion.  Predominately medial joint pain.  No lateral joint pain.  Minimal patellar crepitation.  Full extension of flexed over 120 degrees without instability.  No popliteal mass or pain.  No calf pain.  Motor exam appears to be intact Specialty Comments:  No specialty comments available.  Imaging: No results found.   PMFS History: Patient Active Problem List   Diagnosis Date Noted  . Unilateral primary osteoarthritis, right knee 10/30/2019  . Insomnia 05/06/2015  . Essential hypertension 05/06/2015  . Chronic back pain 05/06/2015  . Type 2 diabetes mellitus without complication ([place]) 123456  . Stroke ([place]) 05/06/2015  . HLD (hyperlipidemia) 05/06/2015  . BPH (benign prostatic hyperplasia) 05/06/2015  . Esophageal reflux 05/06/2015  . History of TB (tuberculosis) 05/06/2015   Past Medical History:  Diagnosis Date  . Arthritis   . Hepatitis   . Hypertension   . 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 Stroke [facility]) 2013    Family History  Problem Relation Age of Onset  . Heart disease Mother   . Hypertension Mother     Past Surgical History:  Procedure Laterality Date  . CHOLECYSTECTOMY    . HAND SURGERY    . rotator cuff surgery     Social History   Occupational History  . Not on file  Tobacco Use  . Smoking status: Former Smoker    Quit date: 08/09/1990    Years since quitting: 29.2  . Smokeless tobacco: Never Used  Substance and Sexual Activity  . Alcohol use: No    Alcohol/week: 0.0 standard drinks  . Drug use: No  . Sexual activity: Yes

## 2019-11-02 ENCOUNTER — Encounter: Payer: Self-pay | Admitting: Internal Medicine

## 2019-11-05 ENCOUNTER — Encounter: Payer: Self-pay | Admitting: Internal Medicine

## 2019-11-05 NOTE — Telephone Encounter (Signed)
Form filled out, placed in MYD box

## 2019-12-12 ENCOUNTER — Other Ambulatory Visit: Payer: Self-pay

## 2019-12-12 MED ORDER — KETOCONAZOLE 2 % EX SHAM: 1.0000 "application " | MEDICATED_SHAMPOO | CUTANEOUS | 0 refills | Status: DC

## 2019-12-12 NOTE — Telephone Encounter (Signed)
1 refill of Ketoconazole Shampoo sent in to [facility].

## 2019-12-13 ENCOUNTER — Encounter: Payer: Self-pay | Admitting: Internal Medicine

## 2019-12-19 ENCOUNTER — Other Ambulatory Visit: Payer: Self-pay

## 2019-12-19 ENCOUNTER — Ambulatory Visit: Payer: Medicare HMO | Admitting: Orthopaedic Surgery

## 2019-12-19 ENCOUNTER — Encounter: Payer: Self-pay | Admitting: Orthopaedic Surgery

## 2019-12-19 ENCOUNTER — Ambulatory Visit (INDEPENDENT_AMBULATORY_CARE_PROVIDER_SITE_OTHER): Payer: Medicare HMO

## 2019-12-19 DIAGNOSIS — M25562 Pain in left knee: Secondary | ICD-10-CM

## 2019-12-19 DIAGNOSIS — G8929 Other chronic pain: Secondary | ICD-10-CM

## 2019-12-19 DIAGNOSIS — M170 Bilateral primary osteoarthritis of knee: Secondary | ICD-10-CM | POA: Diagnosis not present

## 2019-12-19 MED ORDER — METHYLPREDNISOLONE ACETATE 40 MG/ML IJ SUSP
80.0000 mg | INTRAMUSCULAR | Status: AC | PRN
  Administered 2019-12-19: 80 mg via INTRA_ARTICULAR

## 2019-12-19 MED ORDER — LIDOCAINE HCL 1 % IJ SOLN
2.0000 mL | INTRAMUSCULAR | Status: AC | PRN
  Administered 2019-12-19: 2 mL

## 2019-12-19 MED ORDER — BUPIVACAINE HCL 0.5 % IJ SOLN
2.0000 mL | INTRAMUSCULAR | Status: AC | PRN
  Administered 2019-12-19: 2 mL via INTRA_ARTICULAR

## 2019-12-19 NOTE — Progress Notes (Signed)
Office Visit Note   Patient: James Pearson           Date of Birth: 09-30-1945           MRN: [id number] Visit Date: 12/19/2019              Requested by: Jearld Fenton, NP [address] [place],  [place] [address] PCP: Jearld Fenton, NP   Assessment & Plan: Visit Diagnoses:  1. Chronic pain of left knee   2. Bilateral primary osteoarthritis of knee     Plan: Mr. Shallenberger was seen in March for evaluation of right knee pain.  I aspirated about 30 cc of clear fluid and injected cortisone.  He notes it made a big difference.  He still little bit sore but is much better.  He is having similar symptoms on the left side with films demonstrating mild to moderate degenerative changes.  Will inject that knee with cortisone and monitor response.  I did not see any effusion  Follow-Up Instructions: Return if symptoms worsen or fail to improve.   Orders:  Orders Placed This Encounter  Procedures  . Large Joint Inj  . XR KNEE 3 VIEW LEFT   No orders of the defined types were placed in this encounter.     Procedures: Large Joint Inj: L knee on 12/19/2019 4:26 PM Indications: pain and diagnostic evaluation Details: 25 G 1.5 in needle, anteromedial approach  Arthrogram: No  Medications: 2 mL lidocaine 1 %; 2 mL bupivacaine 0.5 %; 80 mg methylPREDNISolone acetate 40 MG/ML Procedure, treatment alternatives, risks and benefits explained, specific risks discussed. Consent was given by the patient. Patient was prepped and draped in the usual sterile fashion.       Clinical Data: No additional findings.   Subjective: Chief Complaint  Patient presents with  . Right Knee - Pain  Patient presents today for his chronic right knee pain. He was here in March of this year and received a cortisone injection. He said that the injection helped, but has been hurting again for the past 3 weeks. His pain is located more anterior and radiates down his leg. The right knee swells. He also  states that his left knee has been hurting for awhile. It has slowly worsened and seems to hurt equally has as bad as the right knee. His left knee hurts anteriorly. No grinding or giving way in either knee. He takes oxycodone for his back pain through the [facility] hospital system. No previous knee surgery.   HPI  Review of Systems   Objective: Vital Signs: There were no vitals taken for this visit.  Physical Exam Constitutional:      Appearance: He is well-developed.  Eyes:     Pupils: Pupils are equal, round, and reactive to light.  Pulmonary:     Effort: Pulmonary effort is normal.  Skin:    General: Skin is warm and dry.  Neurological:     Mental Status: He is alert and oriented to person, place, and time.  Psychiatric:        Behavior: Behavior normal.     Ortho Exam left knee without effusion.  I could fully extend the knee and flex about 100 degrees.  Mostly medial joint pain.  Some lateral pain.  Pain with patella compression and with positive crepitation.  No instability.  No popliteal pain.  No calf pain  Specialty Comments:  No specialty comments available.  Imaging: XR KNEE 3 VIEW LEFT  Result Date: 12/19/2019 Films of his left knee demonstrated some mild arthritic changes in all 3 compartments.  There is slight narrowing of the medial compartment but normal alignment.  No ectopic calcification or acute changes.    PMFS History: Patient Active Problem List   Diagnosis Date Noted  . Bilateral primary osteoarthritis of knee 12/19/2019  . Unilateral primary osteoarthritis, right knee 10/30/2019  . Insomnia 05/06/2015  . Essential hypertension 05/06/2015  . Chronic back pain 05/06/2015  . Type 2 diabetes mellitus without complication ([place]) 123456  . Stroke ([place]) 05/06/2015  . HLD (hyperlipidemia) 05/06/2015  . BPH (benign prostatic hyperplasia) 05/06/2015  . Esophageal reflux 05/06/2015  . History of TB (tuberculosis) 05/06/2015   Past Medical History:   Diagnosis Date  . Arthritis   . Hepatitis   . Hypertension   . 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 Stroke [facility]) 2013    Family History  Problem Relation Age of Onset  . Heart disease Mother   . Hypertension Mother     Past Surgical History:  Procedure Laterality Date  . CHOLECYSTECTOMY    . HAND SURGERY    . rotator cuff surgery     Social History   Occupational History  . Not on file  Tobacco Use  . Smoking status: Former Smoker    Quit date: 08/09/1990    Years since quitting: 29.3  . Smokeless tobacco: Never Used  Substance and Sexual Activity  . Alcohol use: No    Alcohol/week: 0.0 standard drinks  . Drug use: No  . Sexual activity: Yes

## 2020-01-02 ENCOUNTER — Other Ambulatory Visit: Payer: Self-pay | Admitting: Dermatology

## 2020-01-11 ENCOUNTER — Encounter: Payer: Self-pay | Admitting: Internal Medicine

## 2020-01-13 MED ORDER — LISINOPRIL 20 MG PO TABS: 20.0000 mg | ORAL_TABLET | Freq: Every day | ORAL | 0 refills | Status: DC

## 2020-01-14 ENCOUNTER — Encounter: Payer: Self-pay | Admitting: Internal Medicine

## 2020-01-14 MED ORDER — ENALAPRIL MALEATE 20 MG PO TABS: 20.0000 mg | ORAL_TABLET | Freq: Every day | ORAL | 0 refills | Status: DC

## 2020-01-23 ENCOUNTER — Telehealth: Payer: Self-pay

## 2020-01-23 NOTE — Telephone Encounter (Signed)
[place] with [facility] pharmacy left v/m requesting cb to clarify current therapy; rx for lisinopril 20 mg sent on 01/13/20 and pt also has active rx for enalapril 20 mg on 01/14/20. Melissa request cb using ref # [id number].

## 2020-01-31 MED ORDER — ENALAPRIL MALEATE 20 MG PO TABS: 20.0000 mg | ORAL_TABLET | Freq: Every day | ORAL | 0 refills | Status: DC

## 2020-05-23 ENCOUNTER — Encounter: Payer: Self-pay | Admitting: Internal Medicine

## 2020-06-02 MED ORDER — AMLODIPINE BESYLATE 10 MG PO TABS: 10.0000 mg | ORAL_TABLET | Freq: Every day | ORAL | 1 refills | Status: DC

## 2020-07-12 ENCOUNTER — Other Ambulatory Visit: Payer: Self-pay

## 2020-07-12 ENCOUNTER — Encounter (HOSPITAL_COMMUNITY): Payer: Self-pay

## 2020-07-12 ENCOUNTER — Ambulatory Visit (HOSPITAL_COMMUNITY)
Admission: EM | Admit: 2020-07-12 | Discharge: 2020-07-12 | Disposition: A | Discharge status: Home / Self Care | Payer: Medicare HMO | Attending: Emergency Medicine | Admitting: Emergency Medicine

## 2020-07-12 DIAGNOSIS — R059 Cough, unspecified: Secondary | ICD-10-CM | POA: Insufficient documentation

## 2020-07-12 DIAGNOSIS — Z7901 Long term (current) use of anticoagulants: Secondary | ICD-10-CM | POA: Diagnosis not present

## 2020-07-12 DIAGNOSIS — Z87891 Personal history of nicotine dependence: Secondary | ICD-10-CM | POA: Diagnosis not present

## 2020-07-12 DIAGNOSIS — Z8673 Personal history of transient ischemic attack (TIA), and cerebral infarction without residual deficits: Secondary | ICD-10-CM | POA: Insufficient documentation

## 2020-07-12 DIAGNOSIS — I10 Essential (primary) hypertension: Secondary | ICD-10-CM | POA: Insufficient documentation

## 2020-07-12 DIAGNOSIS — R110 Nausea: Secondary | ICD-10-CM | POA: Diagnosis not present

## 2020-07-12 DIAGNOSIS — J029 Acute pharyngitis, unspecified: Secondary | ICD-10-CM | POA: Insufficient documentation

## 2020-07-12 DIAGNOSIS — E119 Type 2 diabetes mellitus without complications: Secondary | ICD-10-CM | POA: Insufficient documentation

## 2020-07-12 DIAGNOSIS — Z7982 Long term (current) use of aspirin: Secondary | ICD-10-CM | POA: Insufficient documentation

## 2020-07-12 DIAGNOSIS — Z79899 Other long term (current) drug therapy: Secondary | ICD-10-CM | POA: Insufficient documentation

## 2020-07-12 DIAGNOSIS — J069 Acute upper respiratory infection, unspecified: Secondary | ICD-10-CM | POA: Insufficient documentation

## 2020-07-12 DIAGNOSIS — Z20822 Contact with and (suspected) exposure to covid-19: Secondary | ICD-10-CM | POA: Insufficient documentation

## 2020-07-12 NOTE — Discharge Instructions (Signed)
Your COVID and Flu tests are pending.  You should self quarantine until the test results are back.    Take Tylenol or ibuprofen as needed for fever or discomfort.  Rest and keep yourself hydrated.    Follow-up with your primary care provider if your symptoms are not improving.     

## 2020-07-12 NOTE — ED Provider Notes (Signed)
[facility]    CSN: [id number] Arrival date & time: 07/12/20  1711      History   Chief Complaint Chief Complaint  Patient presents with  . Sore Throat  . Cough  . Nausea    HPI James Pearson is a 74 y.o. male.   Patient presents with 2-day history of cough, sore throat, and nausea.  He denies fever, rash, shortness of breath, vomiting, diarrhea, or other symptoms.  OTC treatment attempted at home.  His medical history includes hypertension, CVA, osteoarthritis, diabetes, hepatitis, BPH.  The history is provided by the patient.    Past Medical History:  Diagnosis Date  . Arthritis   . Hepatitis   . Hypertension   . 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 Stroke [facility]) 2013    Patient Active Problem List   Diagnosis Date Noted  . Bilateral primary osteoarthritis of knee 12/19/2019  . Unilateral primary osteoarthritis, right knee 10/30/2019  . Insomnia 05/06/2015  . Essential hypertension 05/06/2015  . Chronic back pain 05/06/2015  . Type 2 diabetes mellitus without complication ([place]) 54/98/2641  . Stroke ([place]) 05/06/2015  . HLD (hyperlipidemia) 05/06/2015  . BPH (benign prostatic hyperplasia) 05/06/2015  . Esophageal reflux 05/06/2015  . History of TB (tuberculosis) 05/06/2015    Past Surgical History:  Procedure Laterality Date  . CHOLECYSTECTOMY    . HAND SURGERY    . rotator cuff surgery         Home Medications    Prior to Admission medications   Medication Sig Start Date End Date Taking? Authorizing Provider  amLODipine (NORVASC) 10 MG tablet Take 1 tablet (10 mg total) by mouth daily. 06/02/20   Jearld Fenton, NP  APPLE CIDER VINEGAR PO Take by mouth.    [provider]  aspirin 81 MG chewable tablet Chew 81 mg by mouth daily.    [provider]  atorvastatin (LIPITOR) 80 MG tablet Take 1 tablet (80 mg total) by mouth daily. 01/26/19   Jearld Fenton, NP  clobetasol (TEMOVATE) 0.05 % external solution Apply 1 application  topically 2 (two) times daily. Apply to affected areas on scalp until itchy rash clears/ PRN 10/30/19   Brendolyn Patty, MD  enalapril (VASOTEC) 20 MG tablet Take 1 tablet (20 mg total) by mouth daily. 01/31/20   Jearld Fenton, NP  Fluocinolone Acetonide Body 0.01 % OIL Apply to scalp, face and ears QD to BID 10/30/19   Brendolyn Patty, MD  hydrochlorothiazide (HYDRODIURIL) 25 MG tablet Take 1 tablet (25 mg total) by mouth daily. 05/09/17   Jearld Fenton, NP  ketoconazole (NIZORAL) 2 % shampoo Apply to scalp Three times a week. Leave in for 10 min. 10/30/19   Brendolyn Patty, MD  ketoconazole (NIZORAL) 2 % shampoo Apply 1 application topically as directed. Apply three times weekly, let sit several minutes before rinsing. 12/12/19   Brendolyn Patty, MD  ketoconazole (NIZORAL) 2 % shampoo APPLY THREE TIMES WEEKLY, LET SIT SEVERAL MINUTES BEFORE RINSING. 01/03/20   Brendolyn Patty, MD  Magnesium 400 MG CAPS Take by mouth.    [provider]  Misc Natural Products (PROSTATE HEALTH PO) Take 1 tablet by mouth daily.    [provider]  Omega 3-Lutein-Zeaxanthin (ADVANCED EYE HEALTH PO) Take 1 tablet by mouth daily. Two times a week 04/15/15   [provider]  oxyCODONE (OXY IR/ROXICODONE) 5 MG immediate release tablet Take 5 mg by mouth every 4 (four) hours as  needed for severe pain.    [provider]  Saw Palmetto-Pumpkin Seed Oil 160-100 MG CAPS Take 2 capsules by mouth daily.    [provider]  sildenafil (VIAGRA) 100 MG tablet Take 50-100 mg by mouth daily as needed for erectile dysfunction.    [provider]  Specialty Vitamins Products (PROSTATE PO) Take by mouth daily.    [provider]  tamsulosin (FLOMAX) 0.4 MG CAPS capsule TAKE ONE (1) CAPSULE BY MOUTH EACH DAY Patient taking differently: 0.8 mg. Takes 2 daily 10/04/17   Jearld Fenton, NP  tobramycin-dexamethasone [facility]) ophthalmic solution every 4 (four) hours while awake.    [provider]  traZODone (DESYREL) 50 MG tablet Take 1 tablet (50 mg total) by mouth at bedtime as needed. for sleep 01/22/19   Jearld Fenton, NP  TURMERIC PO Take by mouth daily.    [provider]    Family History Family History  Problem Relation Age of Onset  . Heart disease Mother   . Hypertension Mother     Social History Social History   Tobacco Use  . Smoking status: Former Smoker    Quit date: 08/09/1990    Years since quitting: 29.9  . Smokeless tobacco: Never Used  Substance Use Topics  . Alcohol use: No    Alcohol/week: 0.0 standard drinks  . Drug use: No     Allergies   Omeprazole and Nsaids   Review of Systems Review of Systems  Constitutional: Negative for chills and fever.  HENT: Positive for sore throat. Negative for congestion and ear pain.   Eyes: Negative for pain and visual disturbance.  Respiratory: Positive for cough. Negative for shortness of breath.   Cardiovascular: Negative for chest pain and palpitations.  Gastrointestinal: Positive for nausea. Negative for abdominal pain, diarrhea and vomiting.  Genitourinary: Negative for dysuria and hematuria.  Musculoskeletal: Negative for arthralgias and back pain.  Skin: Negative for color change and rash.  Neurological: Negative for seizures and syncope.  All other systems reviewed and are negative.    Physical Exam Triage Vital Signs ED Triage Vitals  Enc Vitals Group     BP 07/12/20 1754 135/65     Pulse Rate 07/12/20 1754 65     Resp 07/12/20 1754 18     Temp 07/12/20 1754 97.7 F (36.5 C)     Temp Source 07/12/20 1754 Oral     SpO2 07/12/20 1754 99 %     Weight --      Height --      Head Circumference --      Peak Flow --      Pain Score 07/12/20 1753 4     Pain Loc --      Pain Edu? --      Excl. in [place]? --    No data found.  Updated Vital Signs BP 135/65 (BP Location: Right Arm)   Pulse 65   Temp 97.7 F (36.5 C) (Oral)   Resp 18   SpO2 99%   Visual  Acuity Right Eye Distance:   Left Eye Distance:   Bilateral Distance:    Right Eye Near:   Left Eye Near:    Bilateral Near:     Physical Exam Vitals and nursing note reviewed.  Constitutional:      General: He is not in acute distress.    Appearance: He is well-developed.  HENT:     Head: Normocephalic and atraumatic.  Right Ear: Tympanic membrane normal.     Left Ear: Tympanic membrane normal.     Nose: Nose normal.     Mouth/Throat:     Mouth: Mucous membranes are moist.     Pharynx: Oropharynx is clear.  Eyes:     Conjunctiva/sclera: Conjunctivae normal.  Cardiovascular:     Rate and Rhythm: Normal rate and regular rhythm.     Heart sounds: Normal heart sounds.  Pulmonary:     Effort: Pulmonary effort is normal. No respiratory distress.     Breath sounds: Normal breath sounds.  Abdominal:     Palpations: Abdomen is soft.     Tenderness: There is no abdominal tenderness. There is no guarding or rebound.  Musculoskeletal:     Cervical back: Neck supple.  Skin:    General: Skin is warm and dry.     Findings: No rash.  Neurological:     General: No focal deficit present.     Mental Status: He is alert and oriented to person, place, and time.     Gait: Gait normal.  Psychiatric:        Mood and Affect: Mood normal.        Behavior: Behavior normal.      UC Treatments / Results  Labs (all labs ordered are listed, but only abnormal results are displayed) Labs Reviewed  RESP PANEL BY RT-PCR (FLU A&B, COVID) [id number]    EKG   Radiology No results found.  Procedures Procedures (including critical care time)  Medications Ordered in UC Medications - No data to display  Initial Impression / Assessment and Plan / UC Course  I have reviewed the triage vital signs and the nursing notes.  Pertinent labs & imaging results that were available during my care of the patient were reviewed by me and considered in my medical decision making (see chart for  details).   Viral URI with cough.  Influenza and COVID pending.  Instructed patient to self quarantine until the test results are back.  Discussed symptomatic treatment including Tylenol, rest, hydration.  Instructed patient to follow up with PCP if his symptoms are not improving  Patient agrees to plan of care.    Final Clinical Impressions(s) / UC Diagnoses   Final diagnoses:  Viral URI with cough     Discharge Instructions     Your COVID and Flu tests are pending.  You should self quarantine until the test results are back.    Take Tylenol or ibuprofen as needed for fever or discomfort.  Rest and keep yourself hydrated.    Follow-up with your primary care provider if your symptoms are not improving.        ED Prescriptions    None     PDMP not reviewed this encounter.   Sharion Balloon, NP 07/12/20 1905

## 2020-07-12 NOTE — ED Triage Notes (Signed)
Pt presents with sore throat x 2 days; cough and nausea x 1 day. Denies fever, body aches, sob.

## 2020-07-13 LAB — RESP PANEL BY RT-PCR (FLU A&B, COVID) ARPGX2
Influenza A by PCR: NEGATIVE
Influenza B by PCR: NEGATIVE
SARS Coronavirus 2 by RT PCR: NEGATIVE

## 2020-07-30 ENCOUNTER — Encounter: Payer: Self-pay | Admitting: Internal Medicine

## 2020-07-30 ENCOUNTER — Encounter: Payer: Self-pay | Admitting: Orthopaedic Surgery

## 2020-08-06 LAB — HM DIABETES EYE EXAM

## 2020-09-10 ENCOUNTER — Encounter: Payer: Self-pay | Admitting: Internal Medicine

## 2020-10-24 ENCOUNTER — Encounter: Payer: Self-pay | Admitting: Orthopaedic Surgery

## 2020-10-24 ENCOUNTER — Ambulatory Visit: Payer: Medicare HMO | Admitting: Internal Medicine

## 2020-10-27 ENCOUNTER — Ambulatory Visit: Payer: Medicare HMO | Admitting: Internal Medicine

## 2020-10-27 NOTE — Telephone Encounter (Signed)
Have front desk call pt and make an appointment to see him re his knees

## 2020-11-03 ENCOUNTER — Encounter: Payer: Self-pay | Admitting: Internal Medicine

## 2020-11-03 ENCOUNTER — Ambulatory Visit: Payer: Medicare HMO | Admitting: Internal Medicine

## 2020-11-03 ENCOUNTER — Ambulatory Visit (INDEPENDENT_AMBULATORY_CARE_PROVIDER_SITE_OTHER)
Admission: RE | Admit: 2020-11-03 | Discharge: 2020-11-03 | Disposition: A | Discharge status: Home / Self Care | Payer: Medicare HMO | Source: Ambulatory Visit | Attending: Internal Medicine | Admitting: Internal Medicine

## 2020-11-03 ENCOUNTER — Other Ambulatory Visit: Payer: Self-pay

## 2020-11-03 ENCOUNTER — Ambulatory Visit (INDEPENDENT_AMBULATORY_CARE_PROVIDER_SITE_OTHER): Payer: Medicare HMO | Admitting: Internal Medicine

## 2020-11-03 VITALS — BP 130/70 | HR 80 | Temp 98.4°F | Wt 196.0 lb

## 2020-11-03 DIAGNOSIS — M25551 Pain in right hip: Secondary | ICD-10-CM

## 2020-11-03 DIAGNOSIS — M5441 Lumbago with sciatica, right side: Secondary | ICD-10-CM

## 2020-11-03 DIAGNOSIS — G8929 Other chronic pain: Secondary | ICD-10-CM

## 2020-11-03 DIAGNOSIS — M47817 Spondylosis without myelopathy or radiculopathy, lumbosacral region: Secondary | ICD-10-CM | POA: Diagnosis not present

## 2020-11-03 DIAGNOSIS — M47816 Spondylosis without myelopathy or radiculopathy, lumbar region: Secondary | ICD-10-CM | POA: Diagnosis not present

## 2020-11-03 MED ORDER — CYCLOBENZAPRINE HCL 5 MG PO TABS: 5.0000 mg | ORAL_TABLET | Freq: Every evening | ORAL | 0 refills | Status: DC | PRN

## 2020-11-03 MED ORDER — PREDNISONE 10 MG PO TABS: ORAL_TABLET | ORAL | 0 refills | Status: DC

## 2020-11-03 NOTE — Patient Instructions (Signed)
Sciatica  Sciatica is pain, weakness, tingling, or loss of feeling (numbness) along the sciatic nerve. The sciatic nerve starts in the lower back and goes down the back of each leg. Sciatica usually goes away on its own or with treatment. Sometimes, sciatica may come back (recur). What are the causes? This condition happens when the sciatic nerve is pinched or has pressure put on it. This may be the result of:  A disk in between the bones of the spine bulging out too far (herniated disk).  Changes in the spinal disks that occur with aging.  A condition that affects a muscle in the butt.  Extra bone growth near the sciatic nerve.  A break (fracture) of the area between your hip bones (pelvis).  Pregnancy.  Tumor. This is rare. What increases the risk? You are more likely to develop this condition if you:  Play sports that put pressure or stress on the spine.  Have poor strength and ease of movement (flexibility).  Have had a back injury in the past.  Have had back surgery.  Sit for long periods of time.  Do activities that involve bending or lifting over and over again.  Are very overweight (obese). What are the signs or symptoms? Symptoms can vary from mild to very bad. They may include:  Any of these problems in the lower back, leg, hip, or butt: ? Mild tingling, loss of feeling, or dull aches. ? Burning sensations. ? Sharp pains.  Loss of feeling in the back of the calf or the sole of the foot.  Leg weakness.  Very bad back pain that makes it hard to move. These symptoms may get worse when you cough, sneeze, or laugh. They may also get worse when you sit or stand for long periods of time. How is this treated? This condition often gets better without any treatment. However, treatment may include:  Changing or cutting back on physical activity when you have pain.  Doing exercises and stretching.  Putting ice or heat on the affected area.  Medicines that  help: ? To relieve pain and swelling. ? To relax your muscles.  Shots (injections) of medicines that help to relieve pain, irritation, and swelling.  Surgery. Follow these instructions at home: Medicines  Take over-the-counter and prescription medicines only as told by your doctor.  Ask your doctor if the medicine prescribed to you: ? Requires you to avoid driving or using heavy machinery. ? Can cause trouble pooping (constipation). You may need to take these steps to prevent or treat trouble pooping:  Drink enough fluids to keep your pee (urine) pale yellow.  Take over-the-counter or prescription medicines.  Eat foods that are high in fiber. These include beans, whole grains, and fresh fruits and vegetables.  Limit foods that are high in fat and sugar. These include fried or sweet foods. Managing pain  If told, put ice on the affected area. ? Put ice in a plastic bag. ? Place a towel between your skin and the bag. ? Leave the ice on for 20 minutes, 2-3 times a day.  If told, put heat on the affected area. Use the heat source that your doctor tells you to use, such as a moist heat pack or a heating pad. ? Place a towel between your skin and the heat source. ? Leave the heat on for 20-30 minutes. ? Remove the heat if your skin turns bright red. This is very important if you are unable to feel pain,   heat, or cold. You may have a greater risk of getting burned.      Activity  Return to your normal activities as told by your doctor. Ask your doctor what activities are safe for you.  Avoid activities that make your symptoms worse.  Take short rests during the day. ? When you rest for a long time, do some physical activity or stretching between periods of rest. ? Avoid sitting for a long time without moving. Get up and move around at least one time each hour.  Exercise and stretch regularly, as told by your doctor.  Do not lift anything that is heavier than 10 lb (4.5 kg)  while you have symptoms of sciatica. ? Avoid lifting heavy things even when you do not have symptoms. ? Avoid lifting heavy things over and over.  When you lift objects, always lift in a way that is safe for your body. To do this, you should: ? Bend your knees. ? Keep the object close to your body. ? Avoid twisting.   General instructions  Stay at a healthy weight.  Wear comfortable shoes that support your feet. Avoid wearing high heels.  Avoid sleeping on a mattress that is too soft or too hard. You might have less pain if you sleep on a mattress that is firm enough to support your back.  Keep all follow-up visits as told by your doctor. This is important. Contact a doctor if:  You have pain that: ? Wakes you up when you are sleeping. ? Gets worse when you lie down. ? Is worse than the pain you have had in the past. ? Lasts longer than 4 weeks.  You lose weight without trying. Get help right away if:  You cannot control when you pee (urinate) or poop (have a bowel movement).  You have weakness in any of these areas and it gets worse: ? Lower back. ? The area between your hip bones. ? Butt. ? Legs.  You have redness or swelling of your back.  You have a burning feeling when you pee. Summary  Sciatica is pain, weakness, tingling, or loss of feeling (numbness) along the sciatic nerve.  This condition happens when the sciatic nerve is pinched or has pressure put on it.  Sciatica can cause pain, tingling, or loss of feeling (numbness) in the lower back, legs, hips, and butt.  Treatment often includes rest, exercise, medicines, and putting ice or heat on the affected area. This information is not intended to replace advice given to you by your health care provider. Make sure you discuss any questions you have with your health care provider. Document Revised: 08/14/2018 Document Reviewed: 08/14/2018 Elsevier Patient Education  2021 Elsevier Inc.  

## 2020-11-03 NOTE — Progress Notes (Signed)
Subjective:    Patient ID: James Pearson, male    DOB: January 19, 1946, 75 y.o.   MRN: [id number]  HPI  Pt presents to the clinic today with c/o right hip pain. This started 2 years ago. He describes the pain as soreness. The pain radiates down his right leg. He denies numbness, tingling or weakness. He does have some associated back pain. He describes the back as sharp and stabbing. He denies loss of bowel or bladder control.  He has never had a back injury or back surgery that he is aware of. He is taking Oxycodone with minimal relief of symptoms. He did see an orthopedist in [place] many years ago. 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  Review of Systems      Past Medical History:  Diagnosis Date  . Arthritis   . Hepatitis   . Hypertension   . 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 Stroke [facility]) 2013    Current Outpatient Medications  Medication Sig Dispense Refill  . amLODipine (NORVASC) 10 MG tablet Take 1 tablet (10 mg total) by mouth daily. 90 tablet 1  . APPLE CIDER VINEGAR PO Take by mouth.    Marland Kitchen aspirin 81 MG chewable tablet Chew 81 mg by mouth daily.    Marland Kitchen atorvastatin (LIPITOR) 80 MG tablet Take 1 tablet (80 mg total) by mouth daily. 90 tablet 1  . clobetasol (TEMOVATE) 0.05 % external solution Apply 1 application topically 2 (two) times daily. Apply to affected areas on scalp until itchy rash clears/ PRN 50 mL 0  . enalapril (VASOTEC) 20 MG tablet Take 1 tablet (20 mg total) by mouth daily. 90 tablet 0  . Fluocinolone Acetonide Body 0.01 % OIL Apply to scalp, face and ears QD to BID 120 mL 5  . hydrochlorothiazide (HYDRODIURIL) 25 MG tablet Take 1 tablet (25 mg total) by mouth daily. 90 tablet 1  . ketoconazole (NIZORAL) 2 % shampoo Apply to scalp Three times a week. Leave in for 10 min. 120 mL 2  . ketoconazole (NIZORAL) 2 % shampoo Apply 1 application topically as directed. Apply three times weekly, let sit several minutes before  rinsing. 120 mL 0  . ketoconazole (NIZORAL) 2 % shampoo APPLY THREE TIMES WEEKLY, LET SIT SEVERAL MINUTES BEFORE RINSING. 120 mL 3  . Magnesium 400 MG CAPS Take by mouth.    . Misc Natural Products (PROSTATE HEALTH PO) Take 1 tablet by mouth daily.    [name] 3-Lutein-Zeaxanthin (ADVANCED EYE HEALTH PO) Take 1 tablet by mouth daily. Two times a week    . oxyCODONE (OXY IR/ROXICODONE) 5 MG immediate release tablet Take 5 mg by mouth every 4 (four) hours as needed for severe pain.    . Saw Palmetto-Pumpkin Seed Oil 160-100 MG CAPS Take 2 capsules by mouth daily.    . sildenafil (VIAGRA) 100 MG tablet Take 50-100 mg by mouth daily as needed for erectile dysfunction.    Marland Kitchen Specialty Vitamins Products (PROSTATE PO) Take by mouth daily.    . tamsulosin (FLOMAX) 0.4 MG CAPS capsule TAKE ONE (1) CAPSULE BY MOUTH EACH DAY (Patient taking differently: 0.8 mg. Takes 2 daily) 7 capsule 0  . tobramycin-dexamethasone (TOBRADEX) ophthalmic solution every 4 (four) hours while awake.    . traZODone (DESYREL) 50 MG tablet Take 1 tablet (50 mg total) by mouth at bedtime as needed. for sleep 90 tablet 3  . TURMERIC  PO Take by mouth daily.     No current facility-administered medications for this visit.    Allergies  Allergen Reactions  . Omeprazole Anaphylaxis  . Nsaids Nausea And Vomiting    Family History  Problem Relation Age of Onset  . Heart disease Mother   . Hypertension Mother     Social History   Socioeconomic History  . Marital status: Married    Spouse name: Not on file  . Number of children: Not on file  . Years of education: Not on file  . Highest education level: Not on file  Occupational History  . Not on file  Tobacco Use  . Smoking status: Former Smoker    Quit date: 08/09/1990    Years since quitting: 30.2  . Smokeless tobacco: Never Used  Substance and Sexual Activity  . Alcohol use: No    Alcohol/week: 0.0 standard drinks  . Drug use: No  . Sexual activity: Yes  Other  Topics Concern  . Not on file  Social History Narrative  . Not on file   Social Determinants of Health   Financial Resource Strain: Not on file  Food Insecurity: Not on file  Transportation Needs: Not on file  Physical Activity: Not on file  Stress: Not on file  Social Connections: Not on file  Intimate Partner Violence: Not on file     Constitutional: Denies fever, malaise, fatigue, headache or abrupt weight changes.  Respiratory: Denies difficulty breathing, shortness of breath, cough or sputum production.   Cardiovascular: Denies chest pain, chest tightness, palpitations or swelling in the hands or feet.  Gastrointestinal: Denies abdominal pain, bloating, constipation, diarrhea or blood in the stool.  GU: Denies urgency, frequency, pain with urination, burning sensation, blood in urine, odor or discharge. Musculoskeletal: Pt reports chronic low back pain, right hip pain. Denies difficulty with gait, muscle pain or joint swelling.  Skin: Denies redness, rashes, lesions or ulcercations.  Neurological: Denies numbness, tingling, weakness or problems with balance and coordination.    No other specific complaints in a complete review of systems (except as listed in HPI above).  Objective:   Physical Exam BP 130/70   Pulse 80   Temp 98.4 F (36.9 C) (Temporal)   Wt 196 lb (88.9 kg)   SpO2 98%   BMI 29.80 kg/m   Wt Readings from Last 3 Encounters:  10/30/19 185 lb (83.9 kg)  10/22/19 196 lb (88.9 kg)  01/22/19 196 lb (88.9 kg)    General: Appears his stated age, well developed, well nourished in NAD. Skin: Warm, dry and intact. No rashes noted. Cardiovascular: Normal rate and rhythm. 1+ pitting edema RLE, trace pitting edema LLE. Pulmonary/Chest: Normal effort and positive vesicular breath sounds.  Musculoskeletal: Decreased flexion and extension of the spine. Normal rotation and lateral bending. Bony tenderness noted over the lumbar spine. Normal abduction, adduction,  internal and external rotation of the right hip. No pain with palpation of the right hip. Strength 4/5 RLE, 5/5 LLE. No difficulty with gait.  Neurological: Alert and oriented. Positive SLR on the right.   BMET    Component Value Date/Time   NA 139 01/22/2019 1318   K 4.0 01/22/2019 1318   CL 99 01/22/2019 1318   CO2 33 (H) 01/22/2019 1318   GLUCOSE 109 (H) 01/22/2019 1318   BUN 10 01/22/2019 1318   CREATININE 1.20 01/22/2019 1318   CALCIUM 9.5 01/22/2019 1318    Lipid Panel     Component Value Date/Time  CHOL 201 (H) 01/22/2019 1318   TRIG 115.0 01/22/2019 1318   HDL 57.60 01/22/2019 1318   CHOLHDL 3 01/22/2019 1318   VLDL 23.0 01/22/2019 1318   LDLCALC 121 (H) 01/22/2019 1318    CBC    Component Value Date/Time   WBC 6.3 01/22/2019 1318   RBC 6.00 (H) 01/22/2019 1318   HGB 15.2 01/22/2019 1318   HCT 47.0 01/22/2019 1318   PLT 189.0 01/22/2019 1318   MCV 78.2 01/22/2019 1318   MCHC 32.3 01/22/2019 1318   RDW 18.2 (H) 01/22/2019 1318    Hgb A1C Lab Results  Component Value Date   HGBA1C 6.8 (H) 01/22/2019            Assessment & Plan:   Chronic Low Back Pain with Right Sided Sciatica, Right Hip Pain:  Xray lumbar spine Xray right hip RX for Pred Taper x 9 days RX for Flexeril 5 mg QHS- sedation caution given Encouraged stretching, massage and ice Consider PT vs MRI pending xrays  Will follow up after imaging, return precautions discussed  Webb Silversmith, NP This visit occurred during the SARS-CoV-2 public health emergency.  Safety protocols were in place, including screening questions prior to the visit, additional usage of staff PPE, and extensive cleaning of exam room while observing appropriate contact time as indicated for disinfecting solutions.

## 2020-11-05 ENCOUNTER — Ambulatory Visit: Payer: Medicare HMO | Admitting: Orthopaedic Surgery

## 2020-11-05 ENCOUNTER — Ambulatory Visit (INDEPENDENT_AMBULATORY_CARE_PROVIDER_SITE_OTHER): Payer: Medicare HMO

## 2020-11-05 ENCOUNTER — Other Ambulatory Visit: Payer: Self-pay

## 2020-11-05 ENCOUNTER — Encounter: Payer: Self-pay | Admitting: Orthopaedic Surgery

## 2020-11-05 VITALS — Ht 68.0 in | Wt 196.0 lb

## 2020-11-05 DIAGNOSIS — M25512 Pain in left shoulder: Secondary | ICD-10-CM

## 2020-11-05 DIAGNOSIS — G8929 Other chronic pain: Secondary | ICD-10-CM | POA: Diagnosis not present

## 2020-11-05 DIAGNOSIS — M1711 Unilateral primary osteoarthritis, right knee: Secondary | ICD-10-CM

## 2020-11-05 DIAGNOSIS — M170 Bilateral primary osteoarthritis of knee: Secondary | ICD-10-CM

## 2020-11-05 MED ORDER — LIDOCAINE HCL 1 % IJ SOLN
2.0000 mL | INTRAMUSCULAR | Status: AC | PRN
  Administered 2020-11-05: 2 mL

## 2020-11-05 MED ORDER — BUPIVACAINE HCL 0.25 % IJ SOLN
2.0000 mL | INTRAMUSCULAR | Status: AC | PRN
  Administered 2020-11-05: 2 mL via INTRA_ARTICULAR

## 2020-11-05 NOTE — Progress Notes (Signed)
Office Visit Note   Patient: James Pearson           Date of Birth: Nov 11, 1945           MRN: [id number] Visit Date: 11/05/2020              Requested by: Jearld Fenton, NP [address] [place],  [place] [address] PCP: Jearld Fenton, NP   Assessment & Plan: Visit Diagnoses:  1. Chronic left shoulder pain   2. Bilateral primary osteoarthritis of knee   3. Unilateral primary osteoarthritis, right knee     Plan: [name] has an established diagnosis of bilateral knee osteoarthritis.  He recently has been having symptoms referable to the right knee with occasional catching and soreness.  He is done well with cortisone in the past.  I will inject the knee today and reevaluate in 2 weeks.  Might want to obtain more recent films studies continue to have a problem after the injection.  He also relates he has been working out at [employer] several times a week and is developed some pain in his left shoulder particularly with overhead activities.  He has had a prior rotator cuff tear repair on the right.  No specific injury or trauma.  He had very minimal impingement testing and minimal empty can testing with excellent motion.  We will reevaluate in 2 weeks and consider subacromial cortisone injection.  At some point we may want to consider an MRI scan  Follow-Up Instructions: Return in about 2 weeks (around 11/19/2020).   Orders:  Orders Placed This Encounter  Procedures  . Large Joint Inj: R knee  . XR Shoulder Left   No orders of the defined types were placed in this encounter.     Procedures: Large Joint Inj: R knee on 11/05/2020 2:04 PM Indications: pain and diagnostic evaluation Details: 25 G 1.5 in needle, anteromedial approach  Arthrogram: No  Medications: 2 mL lidocaine 1 %; 2 mL bupivacaine 0.25 %  12 mg betamethasone injected the medial compartment right knee with Xylocaine and Marcaine Procedure, treatment alternatives, risks and benefits explained, specific  risks discussed. Consent was given by the patient. Immediately prior to procedure a time out was called to verify the correct patient, procedure, equipment, support staff and site/side marked as required. Patient was prepped and draped in the usual sterile fashion.       Clinical Data: No additional findings.   Subjective: Chief Complaint  Patient presents with  . Right Knee - Pain  . Left Shoulder - Pain  Patient presents today for right knee and left shoulder pain. He states that the cortisone injections he has received in his knees help. His last one was done last year. He wants to get another cortisone injection today. He also has been having left shoulder pain for a year. No known injury. His pain is located anteriorly and superiorly. He has pain with lifting his arm over his head. No numbness or tingling in his upper extremities. He does have left arm weakness. He is right hand dominant. He takes oxycodone daily as prescribed by his doctor at the [place].   HPI  Review of Systems   Objective: Vital Signs: Ht 5\' 8"  (1.727 m)   Wt 196 lb (88.9 kg)   BMI 29.80 kg/m   Physical Exam Constitutional:      Appearance: He is well-developed.  Eyes:     Pupils: Pupils are equal, round, and reactive to light.  Pulmonary:     Effort: Pulmonary effort is normal.  Skin:    General: Skin is warm and dry.  Neurological:     Mental Status: He is alert and oriented to person, place, and time.  Psychiatric:        Behavior: Behavior normal.     Ortho Exam left shoulder with minimally positive impingement testing on the extreme of external rotation.  Minimally positive empty can testing.  Negative Speed sign.  Mild tenderness about the [facility] joint and in the anterior subacromial region.  Biceps intact.  Good grip and release.  Good strength.  No popping or clicking.  Right knee without effusion but does have slight increased varus.  Mild diffuse pain along the medial compartment.  None at the  patellofemoral or lateral compartment had full extension and flexion beyond 100 degrees without instability  Specialty Comments:  No specialty comments available.  Imaging: XR Shoulder Left  Result Date: 11/05/2020 Films of the left shoulder obtained in 3 projections.  Humeral head appears to be centered about the glenoid.  There is normal space between the humeral head and the acromium.  Type I-II acromium.  There are degenerative changes at the [facility] joint with some hypertrophic changes and osteophytes both superiorly and inferiorly.  No ectopic calcification.  No obvious degenerative changes about the glenohumeral joint    PMFS History: Patient Active Problem List   Diagnosis Date Noted  . Pain in left shoulder 11/05/2020  . Bilateral primary osteoarthritis of knee 12/19/2019  . Unilateral primary osteoarthritis, right knee 10/30/2019  . Insomnia 05/06/2015  . Essential hypertension 05/06/2015  . Chronic back pain 05/06/2015  . Type 2 diabetes mellitus without complication ([place]) 25/00/3704  . Stroke ([place]) 05/06/2015  . HLD (hyperlipidemia) 05/06/2015  . BPH (benign prostatic hyperplasia) 05/06/2015  . Esophageal reflux 05/06/2015  . History of TB (tuberculosis) 05/06/2015   Past Medical History:  Diagnosis Date  . Arthritis   . Hepatitis   . Hypertension   . 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 Stroke [facility]) 2013    Family History  Problem Relation Age of Onset  . Heart disease Mother   . Hypertension Mother     Past Surgical History:  Procedure Laterality Date  . CHOLECYSTECTOMY    . HAND SURGERY    . rotator cuff surgery     Social History   Occupational History  . Not on file  Tobacco Use  . Smoking status: Former Smoker    Quit date: 08/09/1990    Years since quitting: 30.2  . Smokeless tobacco: Never Used  Substance and Sexual Activity  . Alcohol use: No    Alcohol/week: 0.0 standard drinks  . Drug use: No  . Sexual activity: Yes

## 2020-11-20 ENCOUNTER — Encounter: Payer: Self-pay | Admitting: Orthopaedic Surgery

## 2020-11-20 ENCOUNTER — Ambulatory Visit: Payer: Medicare HMO | Admitting: Orthopaedic Surgery

## 2020-11-20 ENCOUNTER — Other Ambulatory Visit: Payer: Self-pay

## 2020-11-20 VITALS — Ht 68.0 in | Wt 196.0 lb

## 2020-11-20 DIAGNOSIS — G8929 Other chronic pain: Secondary | ICD-10-CM | POA: Diagnosis not present

## 2020-11-20 DIAGNOSIS — M25512 Pain in left shoulder: Secondary | ICD-10-CM

## 2020-11-20 MED ORDER — BUPIVACAINE HCL 0.25 % IJ SOLN
2.0000 mL | INTRAMUSCULAR | Status: AC | PRN
  Administered 2020-11-20: 2 mL via INTRA_ARTICULAR

## 2020-11-20 MED ORDER — LIDOCAINE HCL 2 % IJ SOLN
2.0000 mL | INTRAMUSCULAR | Status: AC | PRN
  Administered 2020-11-20: 2 mL

## 2020-11-20 MED ORDER — METHYLPREDNISOLONE ACETATE 40 MG/ML IJ SUSP
80.0000 mg | INTRAMUSCULAR | Status: AC | PRN
  Administered 2020-11-20: 80 mg via INTRA_ARTICULAR

## 2020-11-20 NOTE — Progress Notes (Signed)
Office Visit Note   Patient: James Pearson           Date of Birth: [birth year]           MRN: [id number] Visit Date: 11/20/2020              Requested by: Jearld Fenton, NP [place],  [place] [address] PCP: Jearld Fenton, NP   Assessment & Plan: Visit Diagnoses:  1. Chronic left shoulder pain     Plan: Mr. James Pearson has been followed for the osteoarthritis in both of his knees.  He recently had a cortisone injection in his right knee and notes that made a big difference.  He is also having a problem with his left shoulder with positive impingement he thinks it all started after working out at the gym.  There is a concern he may have a rotator cuff tear because he has had 1 on the right side with successful surgery but rather than pursue further diagnostic studies he preferred to have a subacromial cortisone injection.  This was performed without difficulty.  He will let me know how he responds  Follow-Up Instructions: Return if symptoms worsen or fail to improve.   Orders:  Orders Placed This Encounter  Procedures  . Large Joint Inj: L subacromial bursa   No orders of the defined types were placed in this encounter.     Procedures: Large Joint Inj: L subacromial bursa on 11/20/2020 1:24 PM Indications: pain and diagnostic evaluation Details: 25 G 1.5 in needle, anterolateral approach  Arthrogram: No  Medications: 2 mL lidocaine 2 %; 80 mg methylPREDNISolone acetate 40 MG/ML; 2 mL bupivacaine 0.25 % Consent was given by the patient. Immediately prior to procedure a time out was called to verify the correct patient, procedure, equipment, support staff and site/side marked as required. Patient was prepped and draped in the usual sterile fashion.       Clinical Data: No additional findings.   Subjective: Chief Complaint  Patient presents with  . Right Knee - Pain, Follow-up  . Left Shoulder - Pain, Follow-up  Patient presents today for a two  week follow up on his right knee and left shoulder. He received a right knee cortisone injection at his last visit on 11/05/2020. Patient states that his knee has improved, but is still having some lateral knee pain. He wants to get a left shoulder injection today.   HPI  Review of Systems   Objective: Vital Signs: Ht 5\' 8"  (1.727 m)   Wt 196 lb (88.9 kg)   BMI 29.80 kg/m   Physical Exam Constitutional:      Appearance: He is well-developed.  Eyes:     Pupils: Pupils are equal, round, and reactive to light.  Pulmonary:     Effort: Pulmonary effort is normal.  Skin:    General: Skin is warm and dry.  Neurological:     Mental Status: He is alert and oriented to person, place, and time.  Psychiatric:        Behavior: Behavior normal.     Ortho Exam awake alert and oriented x3.  Comfortable sitting.  He relates that the recent cortisone injection in his knee has made a big difference.  He is able to get up and down easily from low sitting position and does not have a limp.  The left shoulder has minimally positive impingement.  He has a little loss of full overhead motion and may be 15  to 20 degrees and might be developing an early adhesive Capsulitis.  No tenderness over the [facility] joint.  Minimal discomfort in the lateral subacromial region.  Biceps intact.  Skin intact.  Good grip and release  Specialty Comments:  No specialty comments available.  Imaging: No results found.   PMFS History: Patient Active Problem List   Diagnosis Date Noted  . Pain in left shoulder 11/05/2020  . Bilateral primary osteoarthritis of knee 12/19/2019  . Unilateral primary osteoarthritis, right knee 10/30/2019  . Insomnia 05/06/2015  . Essential hypertension 05/06/2015  . Chronic back pain 05/06/2015  . Type 2 diabetes mellitus without complication ([place]) 08/67/6195  . Stroke ([place]) 05/06/2015  . HLD (hyperlipidemia) 05/06/2015  . BPH (benign prostatic hyperplasia) 05/06/2015  . Esophageal reflux  05/06/2015  . History of TB (tuberculosis) 05/06/2015   Past Medical History:  Diagnosis Date  . Arthritis   . Hepatitis   . Hypertension   . 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 Stroke [facility]) 2013    Family History  Problem Relation Age of Onset  . Heart disease Mother   . Hypertension Mother     Past Surgical History:  Procedure Laterality Date  . CHOLECYSTECTOMY    . HAND SURGERY    . rotator cuff surgery     Social History   Occupational History  . Not on file  Tobacco Use  . Smoking status: Former Smoker    Quit date: 08/09/1990    Years since quitting: 30.3  . Smokeless tobacco: Never Used  Substance and Sexual Activity  . Alcohol use: No    Alcohol/week: 0.0 standard drinks  . Drug use: No  . Sexual activity: Yes

## 2021-01-01 ENCOUNTER — Telehealth: Payer: Self-pay

## 2021-01-01 ENCOUNTER — Emergency Department (HOSPITAL_COMMUNITY): Payer: Medicare HMO

## 2021-01-01 ENCOUNTER — Emergency Department (HOSPITAL_BASED_OUTPATIENT_CLINIC_OR_DEPARTMENT_OTHER): Payer: Medicare HMO

## 2021-01-01 ENCOUNTER — Encounter (HOSPITAL_COMMUNITY): Payer: Self-pay | Admitting: *Deleted

## 2021-01-01 ENCOUNTER — Emergency Department (HOSPITAL_COMMUNITY)
Admission: EM | Admit: 2021-01-01 | Discharge: 2021-01-01 | Disposition: A | Discharge status: Home / Self Care | Payer: Medicare HMO | Attending: Emergency Medicine | Admitting: Emergency Medicine

## 2021-01-01 ENCOUNTER — Other Ambulatory Visit: Payer: Self-pay

## 2021-01-01 DIAGNOSIS — M549 Dorsalgia, unspecified: Secondary | ICD-10-CM | POA: Diagnosis not present

## 2021-01-01 DIAGNOSIS — R600 Localized edema: Secondary | ICD-10-CM | POA: Diagnosis not present

## 2021-01-01 DIAGNOSIS — E876 Hypokalemia: Secondary | ICD-10-CM | POA: Diagnosis not present

## 2021-01-01 DIAGNOSIS — Z79899 Other long term (current) drug therapy: Secondary | ICD-10-CM | POA: Diagnosis not present

## 2021-01-01 DIAGNOSIS — Z87891 Personal history of nicotine dependence: Secondary | ICD-10-CM | POA: Insufficient documentation

## 2021-01-01 DIAGNOSIS — Z7982 Long term (current) use of aspirin: Secondary | ICD-10-CM | POA: Diagnosis not present

## 2021-01-01 DIAGNOSIS — R079 Chest pain, unspecified: Secondary | ICD-10-CM | POA: Diagnosis not present

## 2021-01-01 DIAGNOSIS — R0789 Other chest pain: Secondary | ICD-10-CM | POA: Diagnosis not present

## 2021-01-01 DIAGNOSIS — M7989 Other specified soft tissue disorders: Secondary | ICD-10-CM

## 2021-01-01 DIAGNOSIS — E119 Type 2 diabetes mellitus without complications: Secondary | ICD-10-CM | POA: Diagnosis not present

## 2021-01-01 DIAGNOSIS — I10 Essential (primary) hypertension: Secondary | ICD-10-CM | POA: Diagnosis not present

## 2021-01-01 LAB — CBC
HCT: 46.8 % (ref 39.0–52.0)
Hemoglobin: 15 g/dL (ref 13.0–17.0)
MCH: 26.2 pg (ref 26.0–34.0)
MCHC: 32.1 g/dL (ref 30.0–36.0)
MCV: 81.8 fL (ref 80.0–100.0)
Platelets: 269 10*3/uL (ref 150–400)
RBC: 5.72 MIL/uL (ref 4.22–5.81)
RDW: 17.3 % — ABNORMAL HIGH (ref 11.5–15.5)
WBC: 8.9 10*3/uL (ref 4.0–10.5)
nRBC: 0 % (ref 0.0–0.2)

## 2021-01-01 LAB — BASIC METABOLIC PANEL
Anion gap: 7 (ref 5–15)
BUN: 5 mg/dL — ABNORMAL LOW (ref 8–23)
CO2: 32 mmol/L (ref 22–32)
Calcium: 9.5 mg/dL (ref 8.9–10.3)
Chloride: 97 mmol/L — ABNORMAL LOW (ref 98–111)
Creatinine, Ser: 1.17 mg/dL (ref 0.61–1.24)
GFR, Estimated: >60 mL/min (ref >60)
Glucose, Bld: 138 mg/dL — ABNORMAL HIGH (ref 70–99)
Potassium: 2.9 mmol/L — ABNORMAL LOW (ref 3.5–5.1)
Sodium: 136 mmol/L (ref 135–145)

## 2021-01-01 LAB — URINALYSIS, ROUTINE W REFLEX MICROSCOPIC
Bacteria, UA: NONE SEEN
Bilirubin Urine: NEGATIVE
Glucose, UA: NEGATIVE mg/dL
Hgb urine dipstick: NEGATIVE
Ketones, ur: NEGATIVE mg/dL
Leukocytes,Ua: NEGATIVE
Nitrite: NEGATIVE
Protein, ur: NEGATIVE mg/dL
Specific Gravity, Urine: 1.003 — ABNORMAL LOW (ref 1.005–1.030)
pH: 7 (ref 5.0–8.0)

## 2021-01-01 LAB — TROPONIN I (HIGH SENSITIVITY)
Troponin I (High Sensitivity): 8 ng/L (ref <18)
Troponin I (High Sensitivity): 8 ng/L (ref <18)

## 2021-01-01 LAB — BRAIN NATRIURETIC PEPTIDE: B Natriuretic Peptide: 13.2 pg/mL (ref 0.0–100.0)

## 2021-01-01 MED ORDER — POTASSIUM CHLORIDE 20 MEQ PO PACK
40.0000 meq | PACK | Freq: Once | ORAL | Status: AC
  Administered 2021-01-01: 40 meq via ORAL
  Filled 2021-01-01: qty 2

## 2021-01-01 MED ORDER — POTASSIUM CHLORIDE CRYS ER 20 MEQ PO TBCR: 20.0000 meq | EXTENDED_RELEASE_TABLET | Freq: Every day | ORAL | 0 refills | Status: DC

## 2021-01-01 NOTE — Telephone Encounter (Signed)
I spoke with pt; pt said had someone come in about some work and has not gone to hospital. Pt declines 911; pt said he will drive himself to [facility] ED because he does not have any CP or pressure at this time and he does not have anyone else to take him. If pt develops chest pain or pressure he will call 911.  Pt will call later to do TOC here at [facility]. Sending note to [place] and [employer].

## 2021-01-01 NOTE — ED Notes (Signed)
DC instructions reviewed with pt.  Pt verbalized understandingl  PT DC.

## 2021-01-01 NOTE — Telephone Encounter (Signed)
[place] [facility] - Client TELEPHONE ADVICE RECORD AccessNurse Patient Name: James Pearson Gender: Male DOB: 1945/08/29 Age: 75 Y 57 M 10 D Return Phone Number: [phone] (Primary) Address: [place] [address] Client [facility] - Client Client Site [place] - Day Physician Webb Silversmith- NP Contact Type Call Who Is Calling Patient / Member / Family / Caregiver Call Type Triage / Clinical Relationship To Patient Self Return Phone Number [phone] (Primary) Chief Complaint CHEST PAIN - pain, pressure, heaviness or tightness Reason for Call Symptomatic / Request for [place] states his ankles are swollen and feels chest pressure/heaviness. Insurance Other Translation No Nurse Assessment Nurse: Alinda Money, RN, Sarah Date/Time (Eastern Time): 01/01/2021 4:14:47 PM Confirm and document reason for call. If symptomatic, describe symptoms. ---Caller states he has been having trouble with swelling in his ankles. States today his ankles are swollen more than usual. States last night he felt some heaviness in his chest. No pain today. Does the patient have any new or worsening symptoms? ---Yes Will a triage be completed? ---Yes Related visit to physician within the last 2 weeks? ---No Does the PT have any chronic conditions? (i.e. diabetes, asthma, this includes High risk factors for pregnancy, etc.) ---Yes List chronic conditions. ---HTN, back pain, prostate problems, Is this a behavioral health or substance abuse call? ---No Guidelines Guideline Title Affirmed Question Affirmed Notes Nurse Date/Time Eilene Ghazi Time) Chest Pain [1] Chest pain lasts < 5 minutes AND [2] NO chest pain or cardiac symptoms (e.g., breathing difficulty, sweating) now (Exception: Goins, RN, Judson Roch 01/01/2021 4:16:31 PM PLEASE NOTE: All timestamps contained within  this report are represented as [place] Standard Time. CONFIDENTIALTY NOTICE: This fax transmission is intended only for the addressee. It contains information that is legally privileged, confidential or otherwise protected from use or disclosure. If you are not the intended recipient, you are strictly prohibited from reviewing, disclosing, copying using or disseminating any of this information or taking any action in reliance on or regarding this information. If you have received this fax in error, please notify us immediately by telephone so that we can arrange for its return to [place]. Phone: [phone], Toll-Free: [phone], Fax: [phone] Page: 2 of 2 Call Id: [id number] Guidelines Guideline Title Affirmed Question Affirmed Notes Nurse Date/Time Eilene Ghazi Time) chest pains that last only a few seconds) Leg Swelling and Edema [1] Difficulty breathing with exertion (e.g., walking) AND [2] new-onset or worsening Goins, RN, Judson Roch 01/01/2021 4:19:37 PM Disp. Time Eilene Ghazi Time) Disposition Final User 01/01/2021 4:12:06 PM Send to Urgent [place], [place] 01/01/2021 4:19:23 PM See PCP within 24 Hours Goins, RN, Judson Roch 01/01/2021 4:22:31 PM Go to ED Now (or PCP triage) Yes Goins, RN, [place] Disagree/Comply Comply Caller Understands Yes PreDisposition Did not know what to do Care Advice Given Per Guideline SEE PCP WITHIN 24 HOURS: * IF OFFICE WILL BE OPEN: You need to be examined within the next 24 hours. Call your doctor (or NP/PA) when the office opens and make an appointment. CALL BACK IF: * You become worse CARE ADVICE given per Chest Pain (Adult) guideline. GO TO ED NOW (OR PCP TRIAGE): * IF NO PCP (PRIMARY CARE PROVIDER) SECOND-LEVEL TRIAGE: You need to be seen within the next hour. Go to the [facility] at _____________ [place] as soon as you can. CARE ADVICE given per Leg Swelling and Edema (Adult) guideline. Referrals GO TO FACILITY UNDECIDED

## 2021-01-01 NOTE — ED Provider Notes (Signed)
Emergency Medicine Provider Triage Evaluation Note  James Pearson , a 75 y.o. male  was evaluated in triage.  Pt complains of chest pain x yesterday while watching the game. Endorses 1 episode of "I had to catch my breath". No prior hx of cardiac disease, no family hx of, no prior hx of blood clots or CHF. No Uri symptoms.   Review of Systems  Positive: Chest pain, shortness Negative: Fever, nausea, vomiting  Physical Exam  BP 134/72   Pulse 78   Temp 98.6 F (37 C)   Resp 14   SpO2 96%  Gen:   Awake, no distress   Resp:  Normal effort  MSK:   Moves extremities without difficulty  Other:    Medical Decision Making  Medically screening exam initiated at 6:46 PM.  Appropriate orders placed.  Kodi Steil was informed that the remainder of the evaluation will be completed by another provider, this initial triage assessment does not replace that evaluation, and the importance of remaining in the ED until their evaluation is complete.    Janeece Fitting, PA-C 01/01/21 1850    Lucrezia Starch, MD 01/02/21 [phone]

## 2021-01-01 NOTE — ED Notes (Signed)
Pt transported to XRay 

## 2021-01-01 NOTE — Progress Notes (Signed)
Lower extremity venous RT study completed.  Preliminary results relayed to [place], [place].   See CV Proc for preliminary results report.   Darlin Coco, RDMS, RVT

## 2021-01-01 NOTE — ED Triage Notes (Signed)
Pt reports having chest discomfort and heaviness last night. Having swelling to his legs for extended time. Denies sob. No acute distress noted at triage.

## 2021-01-01 NOTE — ED Provider Notes (Signed)
[facility] EMERGENCY DEPARTMENT Provider Note   CSN: [id number] Arrival date & time: 01/01/21  1821     History Chief Complaint  Patient presents with  . Chest Pain  . Leg Swelling    James Pearson is a 75 y.o. male.  75 y.o male with a PMH of Stroke,HTN, Hepatitis presents to the ED with a chief complaint of chest pain sudden onset yesterday. One episode of left chest pressure with no radiation that came on while he was "watching the game and the news". Reports taking two deep breaths with complete resolution of his symptoms.  Patient does report he has been under a lot of stress, his wife is currently hospitalized, he does visit her at [facility].  Also reports he was watching the news about the school shooting, he feels like he likely got overworked up.  Endorses that his lower legs have been swollen for quite some time, he did mention this to his primary care physician but has never been evaluated for any heart failure.  No prior cardiac hx, no family hx of CAD, no blood clots in the past. No hx HF.   The history is provided by the patient and medical records.  Chest Pain Pain location:  L chest Pain quality: pressure   Pain quality: not sharp   Pain severity:  Mild Onset quality:  Sudden Duration:  1 day Timing:  Rare Progression:  Resolved Chronicity:  New Context: breathing and at rest   Context: not drug use, not eating, not lifting, not movement and not stress   Relieved by:  Rest Worsened by:  Nothing Ineffective treatments:  None tried Associated symptoms: back pain (chronic per pt) and lower extremity edema   Associated symptoms: no abdominal pain, no cough, no fever, no headache, no nausea, no shortness of breath, no syncope, no vomiting and no weakness        Past Medical History:  Diagnosis Date  . Arthritis   . Hepatitis   . Hypertension   . 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 Stroke [facility]) 2013    Patient Active Problem List    Diagnosis Date Noted  . Pain in left shoulder 11/05/2020  . Bilateral primary osteoarthritis of knee 12/19/2019  . Unilateral primary osteoarthritis, right knee 10/30/2019  . Insomnia 05/06/2015  . Essential hypertension 05/06/2015  . Chronic back pain 05/06/2015  . Type 2 diabetes mellitus without complication ([place]) 97/98/9211  . Stroke ([place]) 05/06/2015  . HLD (hyperlipidemia) 05/06/2015  . BPH (benign prostatic hyperplasia) 05/06/2015  . Esophageal reflux 05/06/2015  . History of TB (tuberculosis) 05/06/2015    Past Surgical History:  Procedure Laterality Date  . CHOLECYSTECTOMY    . HAND SURGERY    . rotator cuff surgery         Family History  Problem Relation Age of Onset  . Heart disease Mother   . Hypertension Mother     Social History   Tobacco Use  . Smoking status: Former Smoker    Quit date: 08/09/1990    Years since quitting: 30.4  . Smokeless tobacco: Never Used  Substance Use Topics  . Alcohol use: No    Alcohol/week: 0.0 standard drinks  . Drug use: No    Home Medications Prior to Admission medications   Medication Sig Start Date End Date Taking? Authorizing Provider  amLODipine (NORVASC) 10 MG tablet Take 1 tablet (10 mg total) by mouth daily. 06/02/20  Yes  Jearld Fenton, NP  aspirin 81 MG chewable tablet Chew 81 mg by mouth daily.   Yes [provider]  atorvastatin (LIPITOR) 80 MG tablet Take 1 tablet (80 mg total) by mouth daily. Patient taking differently: Take 80 mg by mouth at bedtime. 01/26/19  Yes Jearld Fenton, NP  enalapril (VASOTEC) 20 MG tablet Take 1 tablet (20 mg total) by mouth daily. Patient taking differently: Take 20 mg by mouth at bedtime. 01/31/20  Yes Baity, Coralie Keens, NP  Fluocinolone Acetonide Body 0.01 % OIL Apply to scalp, face and ears QD to BID Patient taking differently: Apply 1 application topically daily as needed (irritation). 10/30/19  Yes Brendolyn Patty, MD  hydrochlorothiazide (HYDRODIURIL) 25 MG tablet  Take 1 tablet (25 mg total) by mouth daily. 05/09/17  Yes Baity, Coralie Keens, NP  ketoconazole (NIZORAL) 2 % shampoo APPLY THREE TIMES WEEKLY, LET SIT SEVERAL MINUTES BEFORE RINSING. Patient taking differently: Apply 1 application topically as needed for irritation. 01/03/20  Yes Brendolyn Patty, MD  oxyCODONE (OXY IR/ROXICODONE) 5 MG immediate release tablet Take 5 mg by mouth every 4 (four) hours as needed for severe pain.   Yes [provider]  potassium chloride SA (KLOR-CON) 20 MEQ tablet Take 1 tablet (20 mEq total) by mouth daily for 7 days. 01/01/21 01/08/21 Yes Claudette Wermuth, Beverley Fiedler, PA-C  sildenafil (VIAGRA) 100 MG tablet Take 50-100 mg by mouth daily as needed for erectile dysfunction.   Yes [provider]  Specialty Vitamins Products (PROSTATE PO) Take 1 capsule by mouth at bedtime.   Yes [provider]  tamsulosin (FLOMAX) 0.4 MG CAPS capsule TAKE ONE (1) CAPSULE BY MOUTH EACH DAY Patient taking differently: Take 0.8 mg by mouth at bedtime. 10/04/17  Yes Jearld Fenton, NP  testosterone cypionate (DEPOTESTOSTERONE CYPIONATE) 200 MG/ML injection Inject 1 mL into the muscle every 30 (thirty) days. 08/07/20  Yes [provider]  traZODone (DESYREL) 50 MG tablet Take 1 tablet (50 mg total) by mouth at bedtime as needed. for sleep Patient taking differently: Take 50 mg by mouth at bedtime. 01/22/19  Yes Baity, Coralie Keens, NP  TURMERIC PO Take 1 capsule by mouth daily.   Yes [provider]  clobetasol (TEMOVATE) 0.05 % external solution Apply 1 application topically 2 (two) times daily. Apply to affected areas on scalp until itchy rash clears/ PRN Patient not taking: No sig reported 10/30/19   Brendolyn Patty, MD  cyclobenzaprine (FLEXERIL) 5 MG tablet Take 1 tablet (5 mg total) by mouth at bedtime as needed for muscle spasms. Patient not taking: No sig reported 11/03/20   Jearld Fenton, NP  predniSONE (DELTASONE) 10 MG tablet Take 3 tabs on days 1-3, take 2 tabs on  days 4-6, take 1 tab on days 7-9 Patient not taking: Reported on 01/01/2021 11/03/20   Jearld Fenton, NP  tobramycin-dexamethasone [facility] ophthalmic solution every 4 (four) hours while awake. Patient not taking: No sig reported    [provider]    Allergies    Omeprazole, Gabapentin, Ibuprofen, Ketorolac tromethamine, Meloxicam, Morphine, Nsaids, Oxybutynin chloride, and Propranolol  Review of Systems   Review of Systems  Constitutional: Negative for fever.  HENT: Negative for sore throat.   Respiratory: Negative for cough and shortness of breath.   Cardiovascular: Positive for chest pain. Negative for syncope.  Gastrointestinal: Negative for abdominal pain, nausea and vomiting.  Genitourinary: Negative for flank pain.  Musculoskeletal: Positive for back pain (chronic per pt).  Neurological: Negative for  weakness and headaches.  All other systems reviewed and are negative.   Physical Exam Updated Vital Signs BP (!) 143/76 (BP Location: Right Arm)   Pulse 87   Temp 98 F (36.7 C) (Oral)   Resp 16   SpO2 95%   Physical Exam Vitals and nursing note reviewed.  Constitutional:      Appearance: He is well-developed.  HENT:     Head: Normocephalic and atraumatic.  Eyes:     General: No scleral icterus.    Pupils: Pupils are equal, round, and reactive to light.  Cardiovascular:     Rate and Rhythm: Normal rate.     Heart sounds: Normal heart sounds.     Comments: No calf tenderness bilaterally. Pulmonary:     Effort: Pulmonary effort is normal.     Breath sounds: Normal breath sounds. No wheezing.     Comments: Lungs are clear to auscultation, no rales or wheezing noted.  Chest:     Chest wall: No tenderness.  Abdominal:     General: Bowel sounds are normal. There is no distension.     Palpations: Abdomen is soft.     Tenderness: There is no abdominal tenderness.  Musculoskeletal:        General: No tenderness or deformity.     Cervical back: Normal  range of motion.     Right lower leg: 2+ Edema present.     Left lower leg: 1+ Edema present.  Skin:    General: Skin is warm and dry.  Neurological:     Mental Status: He is alert and oriented to person, place, and time.     ED Results / Procedures / Treatments   Labs (all labs ordered are listed, but only abnormal results are displayed) Labs Reviewed  BASIC METABOLIC PANEL - Abnormal; Notable for the following components:      Result Value   Potassium 2.9 (*)    Chloride 97 (*)    Glucose, Bld 138 (*)    BUN 5 (*)    All other components within normal limits  CBC - Abnormal; Notable for the following components:   RDW 17.3 (*)    All other components within normal limits  URINALYSIS, ROUTINE W REFLEX MICROSCOPIC - Abnormal; Notable for the following components:   APPearance HAZY (*)    Specific Gravity, Urine 1.003 (*)    All other components within normal limits  BRAIN NATRIURETIC PEPTIDE  TROPONIN I (HIGH SENSITIVITY)  TROPONIN I (HIGH SENSITIVITY)    EKG None  Radiology DG Chest 2 View  Result Date: 01/01/2021 CLINICAL DATA:  Chest pain EXAM: CHEST - 2 VIEW COMPARISON:  None. FINDINGS: The lungs are clear without focal pneumonia, edema, pneumothorax or pleural effusion. The cardiopericardial silhouette is within normal limits for size. The visualized bony structures of the thorax show no acute abnormality. IMPRESSION: No active cardiopulmonary disease. Electronically Signed   By: Misty Stanley M.D.   On: 01/01/2021 19:20   VAS [place] LOWER EXTREMITY VENOUS (DVT) (ONLY [facility] & [facility] 7a-7p)  Result Date: 01/01/2021  Lower Venous DVT Study Patient Name:  DENO SIDA  Date of Exam:   01/01/2021 Medical Rec #: [id number]       Accession #:    [id number] Date of Birth: June 16, 1946      Patient Gender: M Patient Age:   74Y Exam Location:  [facility] Procedure:      VAS [place] LOWER EXTREMITY VENOUS (DVT) Referring Phys: [id number] Beverley Fiedler Jashayla Glatfelter  --------------------------------------------------------------------------------  Indications: Rt leg swelling x years.  Comparison Study: No prior studies. Performing Technologist: Darlin Coco RDMS,RVT  Examination Guidelines: A complete evaluation includes B-mode imaging, spectral Doppler, color Doppler, and power Doppler as needed of all accessible portions of each vessel. Bilateral testing is considered an integral part of a complete examination. Limited examinations for reoccurring indications may be performed as noted. The reflux portion of the exam is performed with the patient in reverse Trendelenburg.  +---------+---------------+---------+-----------+----------+--------------+ RIGHT    CompressibilityPhasicitySpontaneityPropertiesThrombus Aging +---------+---------------+---------+-----------+----------+--------------+ CFV      Full           Yes      Yes                                 +---------+---------------+---------+-----------+----------+--------------+ SFJ      Full                                                        +---------+---------------+---------+-----------+----------+--------------+ FV Prox  Full                                                        +---------+---------------+---------+-----------+----------+--------------+ FV Mid   Full                                                        +---------+---------------+---------+-----------+----------+--------------+ FV DistalFull                                                        +---------+---------------+---------+-----------+----------+--------------+ PFV      Full                                                        +---------+---------------+---------+-----------+----------+--------------+ POP      Full           Yes      Yes                                 +---------+---------------+---------+-----------+----------+--------------+ PTV      Full                                                         +---------+---------------+---------+-----------+----------+--------------+ PERO     Full                                                        +---------+---------------+---------+-----------+----------+--------------+   +----+---------------+---------+-----------+----------+--------------+  LEFTCompressibilityPhasicitySpontaneityPropertiesThrombus Aging +----+---------------+---------+-----------+----------+--------------+ CFV Full           Yes      Yes                                 +----+---------------+---------+-----------+----------+--------------+     Summary: RIGHT: - There is no evidence of deep vein thrombosis in the lower extremity.  - No cystic structure found in the popliteal fossa.  LEFT: - No evidence of common femoral vein obstruction.  *See table(s) above for measurements and observations.    Preliminary     Procedures Procedures   Medications Ordered in ED Medications  potassium chloride (KLOR-CON) packet 40 mEq (40 mEq Oral Given 01/01/21 2224)    ED Course  I have reviewed the triage vital signs and the nursing notes.  Pertinent labs & imaging results that were available during my care of the patient were reviewed by me and considered in my medical decision making (see chart for details).  Clinical Course as of 01/01/21 2236  Thu Jan 01, 2021  2106 Potassium(!): 2.9 Provided with oral replacement.  [JS]  2214 B Natriuretic Peptide: 13.2 [JS]    Clinical Course User Index [JS] Janeece Fitting, PA-C   MDM Rules/Calculators/A&P  Patient arrives to the ED with a chief complaint of chest pressure sudden onset yesterday while watching TV, this later resolved by taking 2 deep breaths, episode lasted about a couple seconds.  No prior history of cardiac disease, no family history of cardiac disease, no history of blood clots.  Patient does take only blood pressure medication, high cholesterol medication, reports he had  an echocardiogram several years ago but has not had any cardiology follow-up recently.  He is symptoms free at this time after taking a couple deep breaths.  He does endorse a good amount of stress in the home in the last couple months with his wife being hospitalized at [facility].  He arrived in the ED with stable vital signs, no tachycardia or hypoxia noted, he is afebrile. There is bilateral pitting edema right > than left.  Lungs are clear to auscultation with any wheezing, rhonchi, rales.  Abdomen is soft nontender to palpation.  Chest pain work-up orders placed.  Some suspicion for new onset of CHF with bilateral pitting edema along with short episode of chest pressure.  X-ray of his chest without any acute findings such as pneumonia, no cardiomegaly or pulmonary edema noted.  Right leg pain along with worsening pitting edema, ultrasound lower extremity obtained.  [place] lower extremity showed no right leg DVT.   HEART SCORE 4  Second troponin is negative. BNP is within normal limits.  He was given potassium p.o. to help with his hypokalemia.  He will also go home with a short prescription of oral potassium.  He is advised to follow-up with PCP for further evaluation of bilateral leg swelling.  He remained hemodynamically stable while in the ED, patient stable for discharge.  Portions of this note were generated with [employer]. Dictation errors may occur despite best attempts at proofreading.  Final Clinical Impression(s) / ED Diagnoses Final diagnoses:  Chest wall pain  Hypokalemia    Rx / DC Orders ED Discharge Orders         Ordered    potassium chloride SA (KLOR-CON) 20 MEQ tablet  Daily        01/01/21 2235  Janeece Fitting, PA-C 01/01/21 2236    Lucrezia Starch, MD 01/02/21 [phone]

## 2021-01-01 NOTE — Discharge Instructions (Addendum)
Your laboratory results are within normal limits today.  I discussed all findings with you at length.  You did have a low amount of potassium, we replace it while in the ED.  We will also go home on a short prescription of potassium daily, please have this level rechecked by your primary care physician.  Follow-up with your PCP along with cardiology as needed.

## 2021-01-02 ENCOUNTER — Telehealth: Payer: Self-pay

## 2021-01-02 NOTE — Telephone Encounter (Signed)
Pt called to get ED FU appt; pt seen [place] on 01/01/21. pts K was 2.9 and pt was given Potassium 20 meq taking 1 tab po daily for 7 days. Pt started taking the K on 01/02/21. Pt is not going to [place] office with Avie Echevaria NP. Pt said today he feels OK but pt was notified last night that his wife who is at [facility] in [place] tested + for covid on 01/01/21.  Pt said at 1 AM on 01/02/21 he did home covid test and pt tested +.  Pt said only symptom he has had is scratchy throat which started on 01/01/21. Pt last saw his wife on 12/29/20. Pt had pfizer covid vaccines on 09/26/19 and 10/18/19 and pfizer booster on 05/08/20 and all were given at [place]. Scheduled pt a 40 min video visit on 01/06/20 at 10 am with Dr Einar Pheasant. Pt will self quarantine, drink plenty of fluids and rest. Pt said he cannot take ibuprofen and cannot take tylenol for liver issues. Pt does not have fever now.  UC & ED precautions given and pt voiced understanding. Sending note to Dr Einar Pheasant and [facility] CMA.

## 2021-01-06 ENCOUNTER — Encounter: Payer: Self-pay | Admitting: Family Medicine

## 2021-01-06 ENCOUNTER — Telehealth (INDEPENDENT_AMBULATORY_CARE_PROVIDER_SITE_OTHER): Payer: Medicare HMO | Admitting: Family Medicine

## 2021-01-06 VITALS — BP 116/68 | HR 81 | Temp 97.1°F | Wt 193.0 lb

## 2021-01-06 DIAGNOSIS — U071 COVID-19: Secondary | ICD-10-CM | POA: Diagnosis not present

## 2021-01-06 DIAGNOSIS — E876 Hypokalemia: Secondary | ICD-10-CM | POA: Diagnosis not present

## 2021-01-06 DIAGNOSIS — E119 Type 2 diabetes mellitus without complications: Secondary | ICD-10-CM

## 2021-01-06 DIAGNOSIS — I10 Essential (primary) hypertension: Secondary | ICD-10-CM | POA: Diagnosis not present

## 2021-01-06 DIAGNOSIS — E7800 Pure hypercholesterolemia, unspecified: Secondary | ICD-10-CM

## 2021-01-06 NOTE — Assessment & Plan Note (Signed)
Noted on recent ER visit. Wonder if 2/2 to HCTZ, though not present in the past. Cont 1 week supplement. Repeat labs next week after stopping. Discussed if still low, would do trial of Maxzide to see if that improves and otherwise will need to plan for supplement long term or alternative agent.

## 2021-01-06 NOTE — Telephone Encounter (Signed)
Noted will see today.    

## 2021-01-06 NOTE — Assessment & Plan Note (Addendum)
Lab Results  Component Value Date   HGBA1C 6.8 (H) 01/22/2019   Currently diet controlled. Some concerns about metformin side effects but has never been on this medication. Cont ACE and statin

## 2021-01-06 NOTE — Assessment & Plan Note (Signed)
Lab Results  Component Value Date   LDLCALC 121 (H) 01/22/2019   Already on maximum lipitor 80 mg. Will recheck labs.

## 2021-01-06 NOTE — Assessment & Plan Note (Addendum)
BP controlled. Cont HCTZ 25 mg, enalapril 20 mg, amlodipine 10 mg. May need to change based on potassium check next week.

## 2021-01-06 NOTE — Progress Notes (Signed)
I connected with James Pearson on 01/06/21 at 10:00 AM EDT by video and verified that I am speaking with the correct person using two identifiers.   I discussed the limitations, risks, security and privacy concerns of performing an evaluation and management service by video and the availability of in person appointments. I also discussed with the patient that there may be a patient responsible charge related to this service. The patient expressed understanding and agreed to proceed.  Patient location: Home Provider Location: [place] Participants: Lesleigh Noe and James Pearson   Subjective:     James Pearson is a 75 y.o. male presenting for ER follow-up and Covid Positive (Tested positive on 01/02/21. Onset of symptoms 01/01/21)     HPI  #Covid  - Symptoms 01/01/2021 - scratchy throat - feeling pretty good today and yesterday - each day is something different - throat not clear today - breathing well, not as fatigued - does not feel symptoms have been severe  #CP/Leg swelling - CP has resolved and not returned - episode was in the setting of high stress - watching the news  - Low K - pt on HCTZ, now taking potassium supplement - leg swelling is improved - on amlodipine - DVT rule out - using regular socks   #Hypokalemia - 1 week of potassium      Review of Systems   01/02/2021: Phone - Covid positive - scratchy throat on 01/01/2021 (today is day 5 of illness) 01/01/2021: ER - CP and leg swelling - stress related (wife at [facility]), BNP normal, CXR normal, DVT rule out. troponin neg x 2. Low K - replaced and oral supplement  Social History   Tobacco Use  Smoking Status Former Smoker  . Quit date: 08/09/1990  . Years since quitting: 30.4  Smokeless Tobacco Never Used        Objective:   BP Readings from Last 3 Encounters:  01/06/21 116/68  01/01/21 (!) 156/80  11/03/20 130/70   Wt Readings from Last 3 Encounters:  01/06/21 193  lb (87.5 kg)  11/20/20 196 lb (88.9 kg)  11/05/20 196 lb (88.9 kg)   BP 116/68   Pulse 81   Temp (!) 97.1 F (36.2 C) (Temporal)   Wt 193 lb (87.5 kg)   SpO2 97%   BMI 29.35 kg/m   Physical Exam Constitutional:      Appearance: Normal appearance. He is not ill-appearing.  HENT:     Head: Normocephalic and atraumatic.     Right Ear: External ear normal.     Left Ear: External ear normal.  Eyes:     Conjunctiva/sclera: Conjunctivae normal.  Pulmonary:     Effort: Pulmonary effort is normal. No respiratory distress.  Neurological:     Mental Status: He is alert. Mental status is at baseline.  Psychiatric:        Mood and Affect: Mood normal.        Behavior: Behavior normal.        Thought Content: Thought content normal.        Judgment: Judgment normal.            Assessment & Plan:   Problem List Items Addressed This Visit      Cardiovascular and Mediastinum   Essential hypertension - Primary    BP controlled. Cont HCTZ 25 mg, enalapril 20 mg, amlodipine 10 mg. May need to change based on potassium check next week.  Relevant Orders   Comprehensive metabolic panel     Endocrine   Type 2 diabetes mellitus without complication ([place])    Lab Results  Component Value Date   HGBA1C 6.8 (H) 01/22/2019   Currently diet controlled. Some concerns about metformin side effects but has never been on this medication. Cont ACE and statin       Relevant Orders   Hemoglobin A1c     Other   HLD (hyperlipidemia)    Lab Results  Component Value Date   LDLCALC 121 (H) 01/22/2019   Already on maximum lipitor 80 mg. Will recheck labs.       Relevant Orders   Lipid panel   Hypokalemia    Noted on recent ER visit. Wonder if 2/2 to HCTZ, though not present in the past. Cont 1 week supplement. Repeat labs next week after stopping. Discussed if still low, would do trial of Maxzide to see if that improves and otherwise will need to plan for supplement long term or  alternative agent.       Relevant Orders   Comprehensive metabolic panel    Other Visit Diagnoses    COVID-19 virus infection         Covid symptoms mild and improving. Discussed he is high risk and eligible for paxlovid but he declined. ER precautions and call back discussed   Return in about 1 week (around 01/13/2021) for lab check.  Lesleigh Noe, MD

## 2021-01-12 ENCOUNTER — Other Ambulatory Visit (INDEPENDENT_AMBULATORY_CARE_PROVIDER_SITE_OTHER): Payer: Medicare HMO

## 2021-01-12 ENCOUNTER — Other Ambulatory Visit: Payer: Self-pay

## 2021-01-12 DIAGNOSIS — I10 Essential (primary) hypertension: Secondary | ICD-10-CM | POA: Diagnosis not present

## 2021-01-12 DIAGNOSIS — E7800 Pure hypercholesterolemia, unspecified: Secondary | ICD-10-CM | POA: Diagnosis not present

## 2021-01-12 DIAGNOSIS — E119 Type 2 diabetes mellitus without complications: Secondary | ICD-10-CM | POA: Diagnosis not present

## 2021-01-12 DIAGNOSIS — E876 Hypokalemia: Secondary | ICD-10-CM | POA: Diagnosis not present

## 2021-01-12 LAB — HEMOGLOBIN A1C: Hgb A1c MFr Bld: 7.3 % — ABNORMAL HIGH (ref 4.6–6.5)

## 2021-01-13 ENCOUNTER — Other Ambulatory Visit: Payer: Medicare HMO

## 2021-01-13 LAB — COMPREHENSIVE METABOLIC PANEL
ALT: 27 U/L (ref 0–53)
AST: 27 U/L (ref 0–37)
Albumin: 4.3 g/dL (ref 3.5–5.2)
Alkaline Phosphatase: 103 U/L (ref 39–117)
BUN: 12 mg/dL (ref 6–23)
CO2: 33 mEq/L — ABNORMAL HIGH (ref 19–32)
Calcium: 9.8 mg/dL (ref 8.4–10.5)
Chloride: 100 mEq/L (ref 96–112)
Creatinine, Ser: 1.18 mg/dL (ref 0.40–1.50)
GFR: 61.16 mL/min (ref >60.00)
Glucose, Bld: 140 mg/dL — ABNORMAL HIGH (ref 70–99)
Potassium: 3.5 mEq/L (ref 3.5–5.1)
Sodium: 142 mEq/L (ref 135–145)
Total Bilirubin: 1.1 mg/dL (ref 0.2–1.2)
Total Protein: 7.9 g/dL (ref 6.0–8.3)

## 2021-01-13 LAB — LIPID PANEL
Cholesterol: 109 mg/dL (ref 0–200)
HDL: 51.5 mg/dL (ref >39.00)
LDL Cholesterol: 41 mg/dL (ref 0–99)
NonHDL: 57.25
Total CHOL/HDL Ratio: 2
Triglycerides: 79 mg/dL (ref 0.0–149.0)
VLDL: 15.8 mg/dL (ref 0.0–40.0)

## 2021-03-11 DIAGNOSIS — Z0110 Encounter for examination of ears and hearing without abnormal findings: Secondary | ICD-10-CM | POA: Diagnosis not present

## 2021-03-18 ENCOUNTER — Encounter: Payer: Self-pay | Admitting: Nurse Practitioner

## 2021-03-18 ENCOUNTER — Ambulatory Visit (INDEPENDENT_AMBULATORY_CARE_PROVIDER_SITE_OTHER): Payer: Medicare HMO | Admitting: Nurse Practitioner

## 2021-03-18 ENCOUNTER — Other Ambulatory Visit: Payer: Self-pay

## 2021-03-18 VITALS — BP 116/72 | HR 82 | Temp 98.6°F | Resp 14 | Ht 67.5 in | Wt 192.5 lb

## 2021-03-18 DIAGNOSIS — F5101 Primary insomnia: Secondary | ICD-10-CM

## 2021-03-18 DIAGNOSIS — E119 Type 2 diabetes mellitus without complications: Secondary | ICD-10-CM

## 2021-03-18 DIAGNOSIS — I10 Essential (primary) hypertension: Secondary | ICD-10-CM

## 2021-03-18 DIAGNOSIS — R300 Dysuria: Secondary | ICD-10-CM

## 2021-03-18 DIAGNOSIS — R7989 Other specified abnormal findings of blood chemistry: Secondary | ICD-10-CM

## 2021-03-18 DIAGNOSIS — E876 Hypokalemia: Secondary | ICD-10-CM | POA: Diagnosis not present

## 2021-03-18 LAB — POCT URINALYSIS DIP (CLINITEK)
Bilirubin, UA: NEGATIVE
Blood, UA: NEGATIVE
Glucose, UA: NEGATIVE mg/dL
Ketones, POC UA: NEGATIVE mg/dL
Leukocytes, UA: NEGATIVE
Nitrite, UA: NEGATIVE
POC PROTEIN,UA: NEGATIVE
Spec Grav, UA: 1.015 (ref 1.010–1.025)
Urobilinogen, UA: 0.2 E.U./dL
pH, UA: 7 (ref 5.0–8.0)

## 2021-03-18 NOTE — Assessment & Plan Note (Signed)
Currently maintained on trazodone.  Patient takes as prescribed states nightly.  States once he feels the medication kicking in he go ahead and turns off TV and goes to sleep.  Has no trouble falling asleep states sometimes has trouble staying asleep.  Does get approximately 5 hours of rest at night. Continue trazodone nightly

## 2021-03-18 NOTE — Progress Notes (Signed)
Established Patient Office Visit  Subjective:  Patient ID: James Pearson, male    DOB: 07-04-1946  Age: 75 y.o. MRN: [id number]  CC:  Chief Complaint  Patient presents with   Transfer of Care    From [facility]    HPI James Pearson presents for Transfer of care  HTN: currently on amlodipine, enalapril, HCTZ. States checks once a week sometimes every 2 weeks. Will walk around in the house for exercise. 3 times a week walking 6,000-9,000 steps and uses the machine weights.  DM 2: States he check glucose checked aprox 10 times in the past. Diet controlled currently. OTC perperation to help with glucose.  TIA: 2013. No residual effects Insomnia:Trazodone mostly every night. Helps get to sleep but does not keep him asleep. 5 hours of sleep a night  Low testosterone: Injections from [place]. Injections once a month. No history of blood clots.  BPH: difficulty starting stream but after first urination he is ok. States he is having some dysuria that had start approx 1 week ago, no other symptoms  Past Medical History:  Diagnosis Date   Arthritis    Hepatitis    Hypertension    Positive TB test    as a child   Stroke ([place]) 2013    Past Surgical History:  Procedure Laterality Date   CHOLECYSTECTOMY     HAND SURGERY     rotator cuff surgery      Family History  Problem Relation Age of Onset   Heart disease Mother    Hypertension Mother     Social History   Socioeconomic History   Marital status: Married    Spouse name: Not on file   Number of children: Not on file   Years of education: Not on file   Highest education level: Not on file  Occupational History   Not on file  Tobacco Use   Smoking status: Former    Types: Cigarettes    Quit date: 08/09/1990    Years since quitting: 30.6   Smokeless tobacco: Never  Substance and Sexual Activity   Alcohol use: No    Alcohol/week: 0.0 standard drinks   Drug use: No   Sexual activity: Yes  Other Topics Concern    Not on file  Social History Narrative   Not on file   Social Determinants of Health   Financial Resource Strain: Not on file  Food Insecurity: Not on file  Transportation Needs: Not on file  Physical Activity: Not on file  Stress: Not on file  Social Connections: Not on file  Intimate Partner Violence: Not on file    Outpatient Medications Prior to Visit  Medication Sig Dispense Refill   amLODipine (NORVASC) 10 MG tablet Take 1 tablet (10 mg total) by mouth daily. 90 tablet 1   aspirin 81 MG chewable tablet Chew 81 mg by mouth daily.     atorvastatin (LIPITOR) 80 MG tablet Take 1 tablet (80 mg total) by mouth daily. (Patient taking differently: Take 80 mg by mouth at bedtime.) 90 tablet 1   clobetasol (TEMOVATE) 0.05 % external solution Apply 1 application topically 2 (two) times daily. Apply to affected areas on scalp until itchy rash clears/ PRN 50 mL 0   cyclobenzaprine (FLEXERIL) 5 MG tablet Take 1 tablet (5 mg total) by mouth at bedtime as needed for muscle spasms. 15 tablet 0   enalapril (VASOTEC) 20 MG tablet Take 1 tablet (20 mg total) by mouth daily. (Patient taking differently: Take  20 mg by mouth at bedtime.) 90 tablet 0   Fluocinolone Acetonide Body 0.01 % OIL Apply to scalp, face and ears QD to BID (Patient taking differently: Apply 1 application topically daily as needed (irritation).) 120 mL 5   hydrochlorothiazide (HYDRODIURIL) 25 MG tablet Take 1 tablet (25 mg total) by mouth daily. 90 tablet 1   ketoconazole (NIZORAL) 2 % shampoo APPLY THREE TIMES WEEKLY, LET SIT SEVERAL MINUTES BEFORE RINSING. (Patient taking differently: Apply 1 application topically as needed for irritation.) 120 mL 3   oxyCODONE (OXY IR/ROXICODONE) 5 MG immediate release tablet Take 5 mg by mouth every 4 (four) hours as needed for severe pain.     sildenafil (VIAGRA) 100 MG tablet Take 50-100 mg by mouth daily as needed for erectile dysfunction.     Specialty Vitamins Products (PROSTATE PO) Take 1  capsule by mouth at bedtime.     tamsulosin (FLOMAX) 0.4 MG CAPS capsule TAKE ONE (1) CAPSULE BY MOUTH EACH DAY (Patient taking differently: Take 0.8 mg by mouth at bedtime.) 7 capsule 0   testosterone cypionate (DEPOTESTOSTERONE CYPIONATE) 200 MG/ML injection Inject 1 mL into the muscle every 30 (thirty) days.     tobramycin-dexamethasone (TOBRADEX) ophthalmic solution every 4 (four) hours while awake.     traZODone (DESYREL) 50 MG tablet Take 1 tablet (50 mg total) by mouth at bedtime as needed. for sleep (Patient taking differently: Take 50 mg by mouth at bedtime.) 90 tablet 3   TURMERIC PO Take 1 capsule by mouth daily.     potassium chloride SA (KLOR-CON) 20 MEQ tablet Take 1 tablet (20 mEq total) by mouth daily for 7 days. 7 tablet 0   No facility-administered medications prior to visit.    Allergies  Allergen Reactions   Omeprazole Anaphylaxis   Gabapentin    Ibuprofen Nausea And Vomiting    Other reaction(s): Low blood pressure, NAUSEA,VOMITING, upset stomach   Ketorolac Tromethamine     Other reaction(s): Low blood pressure   Meloxicam Nausea And Vomiting   Morphine Nausea And Vomiting   Nsaids Nausea And Vomiting   Oxybutynin Chloride     Other reaction(s): Xerostomia, Dry eyes, Dry skin, Xerostomia, Dry eyes, Dry skin, Xerostomia, Dry eyes, Dry skin   Propranolol     ROS Review of Systems  Constitutional:  Negative for chills, fatigue and fever.  Respiratory:  Negative for cough and shortness of breath.   Cardiovascular:  Positive for leg swelling. Negative for chest pain.  Gastrointestinal:  Negative for abdominal pain, constipation, diarrhea, nausea and vomiting.  Genitourinary:  Positive for difficulty urinating and dysuria. Negative for hematuria.  Musculoskeletal:  Positive for back pain.  Neurological:  Negative for numbness (tingling).     Objective:    Physical Exam Vitals and nursing note reviewed.  Neck:     Vascular: No carotid bruit.  Cardiovascular:      Rate and Rhythm: Normal rate and regular rhythm.     Heart sounds: Normal heart sounds.  Pulmonary:     Effort: Pulmonary effort is normal.     Breath sounds: Normal breath sounds.  Abdominal:     General: Bowel sounds are normal.     Palpations: Abdomen is soft.     Tenderness: There is no abdominal tenderness.  Musculoskeletal:     Right lower leg: 2+ Edema present.     Left lower leg: 1+ Edema present.  Lymphadenopathy:     Cervical: No cervical adenopathy.  Skin:    General: Skin  is warm.  Neurological:     Mental Status: He is alert.  Psychiatric:        Mood and Affect: Mood normal.        Behavior: Behavior normal.        Thought Content: Thought content normal.        Judgment: Judgment normal.    BP 116/72   Pulse 82   Temp 98.6 F (37 C)   Resp 14   Ht 5' 7.5" (1.715 m)   Wt 192 lb 8 oz (87.3 kg)   SpO2 98%   BMI 29.70 kg/m  Wt Readings from Last 3 Encounters:  03/18/21 192 lb 8 oz (87.3 kg)  01/06/21 193 lb (87.5 kg)  11/20/20 196 lb (88.9 kg)     Health Maintenance Due  Topic Date Due   Hepatitis C Screening  Never done   OPHTHALMOLOGY EXAM  01/10/2019   FOOT EXAM  01/17/2019   INFLUENZA VACCINE  03/09/2021    There are no preventive care reminders to display for this patient.  No results found for: TSH Lab Results  Component Value Date   WBC 8.9 01/01/2021   HGB 15.0 01/01/2021   HCT 46.8 01/01/2021   MCV 81.8 01/01/2021   PLT 269 01/01/2021   Lab Results  Component Value Date   NA 142 01/12/2021   K 3.5 01/12/2021   CO2 33 (H) 01/12/2021   GLUCOSE 140 (H) 01/12/2021   BUN 12 01/12/2021   CREATININE 1.18 01/12/2021   BILITOT 1.1 01/12/2021   ALKPHOS 103 01/12/2021   AST 27 01/12/2021   ALT 27 01/12/2021   PROT 7.9 01/12/2021   ALBUMIN 4.3 01/12/2021   CALCIUM 9.8 01/12/2021   ANIONGAP 7 01/01/2021   GFR 61.16 01/12/2021   Lab Results  Component Value Date   CHOL 109 01/12/2021   Lab Results  Component Value Date    HDL 51.50 01/12/2021   Lab Results  Component Value Date   LDLCALC 41 01/12/2021   Lab Results  Component Value Date   TRIG 79.0 01/12/2021   Lab Results  Component Value Date   CHOLHDL 2 01/12/2021   Lab Results  Component Value Date   HGBA1C 7.3 (H) 01/12/2021      Assessment & Plan:   Problem List Items Addressed This Visit       Cardiovascular and Mediastinum   Essential hypertension    Currently maintained on enalapril and HCTZ.  Patient tolerating well.  States he will check his blood pressure on occasion once a week sometimes once every 2 weeks.  Blood pressure within normal limits in office today.  Continue enalapril and HCTZ.  Continue exercise and lifestyle modifications.  Continue checking blood pressures at home.         Endocrine   Type 2 diabetes mellitus without complication (HCC)    Currently diet controlled.  Last A1c was 7.3.  Given this patient's age we would like A1c less then 8.  Did discuss with patient if increasing in number we will have to consider treatment.  First-line treatment with metformin.  Patient states that he has heard bad things of metformin and not sure he wants to take it.  We will follow-up in 2 months for A1c recheck and go from there. Continue exercise and lifestyle modifications.         Other   Insomnia    Currently maintained on trazodone.  Patient takes as prescribed states nightly.  States once he  feels the medication kicking in he go ahead and turns off TV and goes to sleep.  Has no trouble falling asleep states sometimes has trouble staying asleep.  Does get approximately 5 hours of rest at night. Continue trazodone nightly       Hypokalemia    Resolved as of last bmet       Dysuria - Primary    Started approximate 1 week ago per patient report first urine in the morning states some dysuria and difficulty getting stream started.  After that urine seems to be normal per patient report.  Negative UA in office we will  send off culture pending result.       Relevant Orders   Urine Culture   POCT URINALYSIS DIP (CLINITEK) (Completed)   Low testosterone in male    Currently managed at the [place].  Patient is doing testosterone injections once monthly.  Continue testosterone injections as prescribed.  Follow-up with the [facility] doctor for further management.        No orders of the defined types were placed in this encounter.   Follow-up: Return in about 2 months (around 05/18/2021) for CPE vist and Labs.    Romilda Garret, NP

## 2021-03-18 NOTE — Assessment & Plan Note (Signed)
Currently diet controlled.  Last A1c was 7.3.  Given this patient's age we would like A1c less then 8.  Did discuss with patient if increasing in number we will have to consider treatment.  First-line treatment with metformin.  Patient states that he has heard bad things of metformin and not sure he wants to take it.  We will follow-up in 2 months for A1c recheck and go from there. Continue exercise and lifestyle modifications.

## 2021-03-18 NOTE — Assessment & Plan Note (Signed)
Started approximate 1 week ago per patient report first urine in the morning states some dysuria and difficulty getting stream started.  After that urine seems to be normal per patient report.  Negative UA in office we will send off culture pending result.

## 2021-03-18 NOTE — Assessment & Plan Note (Signed)
Currently maintained on enalapril and HCTZ.  Patient tolerating well.  States he will check his blood pressure on occasion once a week sometimes once every 2 weeks.  Blood pressure within normal limits in office today.  Continue enalapril and HCTZ.  Continue exercise and lifestyle modifications.  Continue checking blood pressures at home.

## 2021-03-18 NOTE — Patient Instructions (Signed)
I want to see you back in 2 months. I will try and get the labs from the [place] Call with any questions or concerns

## 2021-03-18 NOTE — Assessment & Plan Note (Signed)
Resolved as of last bmet

## 2021-03-18 NOTE — Assessment & Plan Note (Signed)
Currently managed at the [place].  Patient is doing testosterone injections once monthly.  Continue testosterone injections as prescribed.  Follow-up with the [facility] doctor for further management.

## 2021-03-19 LAB — URINE CULTURE
MICRO NUMBER:: 12225410
Result:: NO GROWTH
SPECIMEN QUALITY:: ADEQUATE

## 2021-03-23 ENCOUNTER — Encounter: Payer: Self-pay | Admitting: Optometrist

## 2021-03-26 ENCOUNTER — Encounter: Payer: Self-pay | Admitting: Orthopaedic Surgery

## 2021-03-26 ENCOUNTER — Ambulatory Visit: Payer: Self-pay

## 2021-03-26 ENCOUNTER — Other Ambulatory Visit: Payer: Self-pay

## 2021-03-26 ENCOUNTER — Ambulatory Visit (INDEPENDENT_AMBULATORY_CARE_PROVIDER_SITE_OTHER): Payer: Medicare HMO

## 2021-03-26 ENCOUNTER — Ambulatory Visit (INDEPENDENT_AMBULATORY_CARE_PROVIDER_SITE_OTHER): Payer: Medicare HMO | Admitting: Orthopaedic Surgery

## 2021-03-26 DIAGNOSIS — M170 Bilateral primary osteoarthritis of knee: Secondary | ICD-10-CM

## 2021-03-26 DIAGNOSIS — G8929 Other chronic pain: Secondary | ICD-10-CM | POA: Diagnosis not present

## 2021-03-26 DIAGNOSIS — M25561 Pain in right knee: Secondary | ICD-10-CM

## 2021-03-26 DIAGNOSIS — M25562 Pain in left knee: Secondary | ICD-10-CM | POA: Diagnosis not present

## 2021-03-26 MED ORDER — BUPIVACAINE HCL 0.25 % IJ SOLN
2.0000 mL | INTRAMUSCULAR | Status: AC | PRN
  Administered 2021-03-26: 2 mL via INTRA_ARTICULAR

## 2021-03-26 MED ORDER — METHYLPREDNISOLONE ACETATE 40 MG/ML IJ SUSP
80.0000 mg | INTRAMUSCULAR | Status: AC | PRN
  Administered 2021-03-26: 80 mg via INTRA_ARTICULAR

## 2021-03-26 MED ORDER — LIDOCAINE HCL 1 % IJ SOLN
2.0000 mL | INTRAMUSCULAR | Status: AC | PRN
  Administered 2021-03-26: 2 mL

## 2021-03-26 NOTE — Progress Notes (Signed)
Office Visit Note   Patient: James Pearson           Date of Birth: [birth year]           MRN: [id number] Visit Date: 03/26/2021              Requested by: Michela Pitcher, NP [address],  [place] [address] PCP: Michela Pitcher, NP   Assessment & Plan: Visit Diagnoses:  1. Chronic pain of both knees   2. Bilateral primary osteoarthritis of knee     Plan: Mr. Smoker is been seen in the past for osteoarthritis of both of his knees.  Has had cortisone injections in the past which he notes make a big difference.  He has had films of both of his knees demonstrating additional degenerative changes.  Films today demonstrate more degenerative change in the medial compartment of the right knee than on the left.  He notes that he has a number of medical problems being followed through the [place] system in terms of his back issue he also asks about getting organized physical therapy for his back.  I told him I be happy to but if that PVA was paying for the service that they need to approve it and he will check on that.  He is also caring for his wife who has cancer and end-stage renal disease and he notes that he is not in any position to consider surgery so would like to have another cortisone injection in the right knee is not having that much trouble on the left  Follow-Up Instructions: Return if symptoms worsen or fail to improve.   Orders:  Orders Placed This Encounter  Procedures   Large Joint Inj: R knee   XR KNEE 3 VIEW LEFT   XR KNEE 3 VIEW RIGHT   No orders of the defined types were placed in this encounter.     Procedures: Large Joint Inj: R knee on 03/26/2021 4:27 PM Indications: pain and diagnostic evaluation Details: 25 G 1.5 in needle, anteromedial approach  Arthrogram: No  Medications: 2 mL lidocaine 1 %; 80 mg methylPREDNISolone acetate 40 MG/ML; 2 mL bupivacaine 0.25 % Procedure, treatment alternatives, risks and benefits explained, specific risks discussed.  Consent was given by the patient. Immediately prior to procedure a time out was called to verify the correct patient, procedure, equipment, support staff and site/side marked as required. Patient was prepped and draped in the usual sterile fashion.      Clinical Data: No additional findings.   Subjective: Chief Complaint  Patient presents with   Right Knee - Pain   Left Knee - Pain  Patient presents today for bilateral knee pain. He said that they have hurt for at least two years. The right side is worse than the left. He states that they feel weak at times. No grinding, but states that the right leg swells. He does not take anything for pain. He also reports a knot behind his left shoulder. He saw Dr.Whitfield and received a left shoulder injection in April. He states that the injection helped, but noticed a knot shortly afterwards. His arm is weak.   HPI  Review of Systems   Objective: Vital Signs: There were no vitals taken for this visit.  Physical Exam Constitutional:      Appearance: He is well-developed.  Pulmonary:     Effort: Pulmonary effort is normal.  Skin:    General: Skin is warm and dry.  Neurological:     Mental Status: He is alert and oriented to person, place, and time.  Psychiatric:        Behavior: Behavior normal.    Ortho Exam right knee was not hot red warm or swollen.  There was slight varus position with weightbearing and some medial joint pain.  No pain laterally.  Mild patella crepitation but no pain with compression.  Full extension flexed over 100 degrees without instability.  Bilateral pitting edema of both ankles which is chronic.  [name] is noted a small knot behind the left shoulder.  In the posterior subacromial region there is a small subcu reticular nonpainful nonerythematous sebaceous cyst  Specialty Comments:  No specialty comments available.  Imaging: XR KNEE 3 VIEW LEFT  Result Date: 03/26/2021 Films of the left knee  obtained in 3 projections standing and compared to the films of the right knee.  There are some mild degenerative changes in the medial compartment with mild narrowing but no subchondral cysts or significant sclerosis.  Minimal osteophyte formation.  Some patellofemoral arthritis and lateral arthritic changes well.  Films are consistent with mild to moderate osteoarthritis and less change compared to the right knee.  No acute change  XR KNEE 3 VIEW RIGHT  Result Date: 03/26/2021 Films of the right knee were obtained in 3 projections standing.  There is significant degenerative change in the medial compartment with narrowing and maybe 1 degree of varus.  There are subchondral cysts on the femoral side and some sclerosis on both sides of the joint.  Degenerative changes are mild laterally and moderate beneath the patella.  Films are consistent with advanced osteoarthritis without acute change or ectopic calcification    PMFS History: Patient Active Problem List   Diagnosis Date Noted   Dysuria 03/18/2021   Low testosterone in male 03/18/2021   Hypokalemia 01/06/2021   Pain in left shoulder 11/05/2020   Bilateral primary osteoarthritis of knee 12/19/2019   Unilateral primary osteoarthritis, right knee 10/30/2019   Insomnia 05/06/2015   Essential hypertension 05/06/2015   Chronic back pain 05/06/2015   Type 2 diabetes mellitus without complication ([place]) 123456   Stroke ([place]) 05/06/2015   HLD (hyperlipidemia) 05/06/2015   BPH (benign prostatic hyperplasia) 05/06/2015   Esophageal reflux 05/06/2015   History of TB (tuberculosis) 05/06/2015   Past Medical History:  Diagnosis Date   Arthritis    Hepatitis    Hypertension    Positive TB test    as a child   Stroke ([place]) 2013    Family History  Problem Relation Age of Onset   Heart disease Mother    Hypertension Mother     Past Surgical History:  Procedure Laterality Date   CHOLECYSTECTOMY     HAND SURGERY     rotator cuff  surgery     Social History   Occupational History   Not on file  Tobacco Use   Smoking status: Former    Types: Cigarettes    Quit date: 08/09/1990    Years since quitting: 30.6   Smokeless tobacco: Never  Substance and Sexual Activity   Alcohol use: No    Alcohol/week: 0.0 standard drinks   Drug use: No   Sexual activity: Yes

## 2021-03-30 ENCOUNTER — Encounter: Payer: Self-pay | Admitting: Nurse Practitioner

## 2021-05-14 LAB — VITAMIN D 25 HYDROXY (VIT D DEFICIENCY, FRACTURES): Vit D, 25-Hydroxy: 48.4

## 2021-05-14 LAB — BASIC METABOLIC PANEL
BUN: 13 (ref 4–21)
CO2: 33 — AB (ref 13–22)
Chloride: 108 (ref 99–108)
Creatinine: 1.2 (ref 0.6–1.3)
Glucose: 103
Potassium: 3.6 (ref 3.4–5.3)
Sodium: 141 (ref 137–147)

## 2021-05-14 LAB — CBC: RBC: 3.8 — AB (ref 3.87–5.11)

## 2021-05-14 LAB — CBC AND DIFFERENTIAL
HCT: 48 (ref 41–53)
Hemoglobin: 15 (ref 13.5–17.5)
Platelets: 175 (ref 150–399)
WBC: 8.8

## 2021-05-14 LAB — COMPREHENSIVE METABOLIC PANEL: Calcium: 9.8 (ref 8.7–10.7)

## 2021-05-14 LAB — TSH: TSH: 1.4 (ref 0.41–5.90)

## 2021-05-14 LAB — T4, FREE: Free T4: 0.93

## 2021-05-25 ENCOUNTER — Encounter: Payer: Self-pay | Admitting: Nurse Practitioner

## 2021-06-23 ENCOUNTER — Encounter: Payer: Self-pay | Admitting: *Deleted

## 2021-06-23 DIAGNOSIS — E669 Obesity, unspecified: Secondary | ICD-10-CM | POA: Insufficient documentation

## 2021-06-23 DIAGNOSIS — G20 Parkinson's disease: Secondary | ICD-10-CM | POA: Insufficient documentation

## 2021-06-23 DIAGNOSIS — Z86010 Personal history of colonic polyps: Secondary | ICD-10-CM | POA: Insufficient documentation

## 2021-06-23 DIAGNOSIS — G20A1 Parkinson's disease without dyskinesia, without mention of fluctuations: Secondary | ICD-10-CM | POA: Insufficient documentation

## 2021-06-23 DIAGNOSIS — G5600 Carpal tunnel syndrome, unspecified upper limb: Secondary | ICD-10-CM | POA: Insufficient documentation

## 2021-06-23 DIAGNOSIS — M5412 Radiculopathy, cervical region: Secondary | ICD-10-CM | POA: Insufficient documentation

## 2021-06-23 DIAGNOSIS — F528 Other sexual dysfunction not due to a substance or known physiological condition: Secondary | ICD-10-CM | POA: Insufficient documentation

## 2021-06-23 DIAGNOSIS — M75100 Unspecified rotator cuff tear or rupture of unspecified shoulder, not specified as traumatic: Secondary | ICD-10-CM | POA: Insufficient documentation

## 2021-06-23 DIAGNOSIS — D126 Benign neoplasm of colon, unspecified: Secondary | ICD-10-CM | POA: Insufficient documentation

## 2021-08-06 LAB — HEMOGLOBIN A1C: Hemoglobin A1C: 6.7

## 2021-08-06 LAB — IRON,TIBC AND FERRITIN PANEL
%SAT: 13
Iron: 43
TIBC: 330

## 2021-08-14 ENCOUNTER — Encounter: Payer: Self-pay | Admitting: Nurse Practitioner

## 2021-08-26 ENCOUNTER — Ambulatory Visit: Payer: Medicare Other | Admitting: Orthopaedic Surgery

## 2021-08-26 ENCOUNTER — Other Ambulatory Visit: Payer: Self-pay

## 2021-08-26 ENCOUNTER — Encounter: Payer: Self-pay | Admitting: Orthopaedic Surgery

## 2021-08-26 ENCOUNTER — Ambulatory Visit (INDEPENDENT_AMBULATORY_CARE_PROVIDER_SITE_OTHER): Payer: Medicare Other | Admitting: Nurse Practitioner

## 2021-08-26 VITALS — BP 126/62 | HR 77 | Temp 98.4°F | Resp 12 | Ht 67.5 in | Wt 194.5 lb

## 2021-08-26 VITALS — Ht 67.5 in | Wt 192.0 lb

## 2021-08-26 DIAGNOSIS — F4321 Adjustment disorder with depressed mood: Secondary | ICD-10-CM | POA: Diagnosis not present

## 2021-08-26 DIAGNOSIS — G8929 Other chronic pain: Secondary | ICD-10-CM

## 2021-08-26 DIAGNOSIS — M25561 Pain in right knee: Secondary | ICD-10-CM | POA: Diagnosis not present

## 2021-08-26 DIAGNOSIS — F419 Anxiety disorder, unspecified: Secondary | ICD-10-CM | POA: Insufficient documentation

## 2021-08-26 DIAGNOSIS — M25562 Pain in left knee: Secondary | ICD-10-CM | POA: Insufficient documentation

## 2021-08-26 DIAGNOSIS — N400 Enlarged prostate without lower urinary tract symptoms: Secondary | ICD-10-CM

## 2021-08-26 DIAGNOSIS — F32A Depression, unspecified: Secondary | ICD-10-CM | POA: Diagnosis not present

## 2021-08-26 DIAGNOSIS — M170 Bilateral primary osteoarthritis of knee: Secondary | ICD-10-CM

## 2021-08-26 DIAGNOSIS — M25512 Pain in left shoulder: Secondary | ICD-10-CM | POA: Diagnosis not present

## 2021-08-26 MED ORDER — METHYLPREDNISOLONE ACETATE 40 MG/ML IJ SUSP
80.0000 mg | INTRAMUSCULAR | Status: AC | PRN
  Administered 2021-08-26: 80 mg via INTRA_ARTICULAR

## 2021-08-26 MED ORDER — SERTRALINE HCL 25 MG PO TABS: 25.0000 mg | ORAL_TABLET | Freq: Every day | ORAL | 0 refills | Status: DC

## 2021-08-26 MED ORDER — TAMSULOSIN HCL 0.4 MG PO CAPS: 0.8000 mg | ORAL_CAPSULE | Freq: Every day | ORAL | 0 refills | Status: DC

## 2021-08-26 MED ORDER — LIDOCAINE HCL 1 % IJ SOLN
5.0000 mL | INTRAMUSCULAR | Status: AC | PRN
  Administered 2021-08-26: 5 mL

## 2021-08-26 NOTE — Assessment & Plan Note (Signed)
Related orthopedist and had injections right knee.  He states felt a little worse after injection did tell patient that this is par for the course after getting a joint injection.  And imaging likely is wearing out he is feeling where they have injected the solution which can cause tightness and decreased range of motion.  This should resolve over the next 24 to 48 hours to discuss this with patient.  Continue to monitor

## 2021-08-26 NOTE — Progress Notes (Signed)
Acute Office Visit  Subjective:    Patient ID: James Pearson, male    DOB: 10/22/1945, 76 y.o.   MRN: [id number]  Chief Complaint  Patient presents with   Shoulder Pain    Left shoulder-x few months. Received cortisone injection today. Does not feel like it helped and his range of motion is worse than it was before the shot.   Medication Refill     Patient is in today for Shoulder pain   Was seen at ortho today for left shoulder injection and right knee injection. States after he got the injection that it was worse. Did review the ortho note from todays visit. States that patient did well with no complications  Sleeping: states that his sleep has always been off. States it is worse when his wife passed away. States getting 5-7 hours of sleep. States that he does wake up because of his shoulder or knee. States that he does not generally get up to go to the bathroom. He is currently on trazodone for sleep and states that he does take it as needed. He has been ruled out for sleep apnea in the past but that was many  years ago.  [facility]. No Hi/SI/AVH. Does go to church every week. States that they offer grief counseling but he has not decided to do that yet   [facility]  Past Medical History:  Diagnosis Date   Arthritis    Hepatitis    Hypertension    Positive TB test    as a child   Stroke [facility]) 2013    Past Surgical History:  Procedure Laterality Date   CHOLECYSTECTOMY     HAND SURGERY     rotator cuff surgery      Family History  Problem Relation Age of Onset   Heart disease Mother    Hypertension Mother     Social History   Socioeconomic History   Marital status: Widowed    Spouse name: Not on file   Number of children: Not on file   Years of education: Not on file   Highest education level: Not on file  Occupational History   Not on file  Tobacco Use   Smoking status: Former     Types: Cigarettes    Quit date: 08/09/1990    Years since quitting: 31.0   Smokeless tobacco: Never  Substance and Sexual Activity   Alcohol use: No    Alcohol/week: 0.0 standard drinks   Drug use: No   Sexual activity: Yes  Other Topics Concern   Not on file  Social History Narrative   Not on file   Social Determinants of Health   Financial Resource Strain: Not on file  Food Insecurity: Not on file  Transportation Needs: Not on file  Physical Activity: Not on file  Stress: Not on file  Social Connections: Not on file  Intimate Partner Violence: Not on file    Outpatient Medications Prior to Visit  Medication Sig Dispense Refill   amLODipine (NORVASC) 10 MG tablet Take 1 tablet (10 mg total) by mouth daily. 90 tablet 1   aspirin 81 MG chewable tablet Chew 81 mg by mouth daily.     atorvastatin (LIPITOR) 80 MG tablet Take 1 tablet (80 mg total) by mouth daily. (Patient taking differently: Take 80 mg by mouth at bedtime.) 90 tablet 1   clobetasol (TEMOVATE) 0.05 %  external solution Apply 1 application topically 2 (two) times daily. Apply to affected areas on scalp until itchy rash clears/ PRN 50 mL 0   cyclobenzaprine (FLEXERIL) 5 MG tablet Take 1 tablet (5 mg total) by mouth at bedtime as needed for muscle spasms. 15 tablet 0   enalapril (VASOTEC) 20 MG tablet Take 1 tablet (20 mg total) by mouth daily. (Patient taking differently: Take 20 mg by mouth at bedtime.) 90 tablet 0   Fluocinolone Acetonide Body 0.01 % OIL Apply to scalp, face and ears QD to BID (Patient taking differently: Apply 1 application topically daily as needed (irritation).) 120 mL 5   hydrochlorothiazide (HYDRODIURIL) 25 MG tablet Take 1 tablet (25 mg total) by mouth daily. 90 tablet 1   ketoconazole (NIZORAL) 2 % shampoo APPLY THREE TIMES WEEKLY, LET SIT SEVERAL MINUTES BEFORE RINSING. (Patient taking differently: Apply 1 application topically as needed for irritation.) 120 mL 3   oxyCODONE (OXY IR/ROXICODONE) 5  MG immediate release tablet Take 5 mg by mouth every 4 (four) hours as needed for severe pain.     sildenafil (VIAGRA) 100 MG tablet Take 50-100 mg by mouth daily as needed for erectile dysfunction.     Specialty Vitamins Products (PROSTATE PO) Take 1 capsule by mouth at bedtime.     tamsulosin (FLOMAX) 0.4 MG CAPS capsule Take by mouth. Takes 2 capsules daily     testosterone cypionate (DEPOTESTOSTERONE CYPIONATE) 200 MG/ML injection Inject 1 mL into the muscle every 14 (fourteen) days.     tobramycin-dexamethasone (TOBRADEX) ophthalmic solution every 4 (four) hours while awake.     traZODone (DESYREL) 50 MG tablet Take 1 tablet (50 mg total) by mouth at bedtime as needed. for sleep (Patient taking differently: Take 50 mg by mouth at bedtime.) 90 tablet 3   TURMERIC PO Take 1 capsule by mouth daily.     potassium chloride SA (KLOR-CON) 20 MEQ tablet Take 1 tablet (20 mEq total) by mouth daily for 7 days. 7 tablet 0   tamsulosin (FLOMAX) 0.4 MG CAPS capsule TAKE ONE (1) CAPSULE BY MOUTH EACH DAY (Patient taking differently: Take 0.8 mg by mouth at bedtime.) 7 capsule 0   No facility-administered medications prior to visit.    Allergies  Allergen Reactions   Omeprazole Anaphylaxis   Gabapentin    Ibuprofen Nausea And Vomiting    Other reaction(s): Low blood pressure, NAUSEA,VOMITING, upset stomach   Ketorolac Tromethamine     Other reaction(s): Low blood pressure   Meloxicam Nausea And Vomiting   Morphine Nausea And Vomiting   Nsaids Nausea And Vomiting   Oxybutynin Chloride     Other reaction(s): Xerostomia, Dry eyes, Dry skin, Xerostomia, Dry eyes, Dry skin, Xerostomia, Dry eyes, Dry skin   Propranolol     Review of Systems  Constitutional:  Negative for chills and fever.  Respiratory:  Negative for cough and shortness of breath.   Cardiovascular:  Negative for chest pain.  Musculoskeletal:  Positive for arthralgias. Negative for joint swelling.  Psychiatric/Behavioral:  Positive  for sleep disturbance. Negative for hallucinations and suicidal ideas.    PHQ9 SCORE ONLY 08/26/2021 11/03/2020 01/22/2019  PHQ-9 Total Score 14 0 1    GAD 7 : Generalized Anxiety Score 08/26/2021  Nervous, Anxious, on Edge 2  Control/stop worrying 1  Worry too much - different things 3  Trouble relaxing 2  Restless 0  Easily annoyed or irritable 2  Afraid - awful might happen 3  Total GAD 7 Score 13  Objective:    Physical Exam Constitutional:      Appearance: Normal appearance.  Cardiovascular:     Rate and Rhythm: Normal rate and regular rhythm.     Pulses: Normal pulses.     Heart sounds: Normal heart sounds.  Pulmonary:     Effort: Pulmonary effort is normal.     Breath sounds: Normal breath sounds.  Abdominal:     General: Bowel sounds are normal.  Musculoskeletal:     Right lower leg: 2+ Pitting Edema present.     Left lower leg: 2+ Pitting Edema present.  Neurological:     Mental Status: He is alert.  Psychiatric:        Mood and Affect: Mood normal.        Behavior: Behavior normal.        Thought Content: Thought content normal.        Judgment: Judgment normal.    BP 126/62    Pulse 77    Temp 98.4 F (36.9 C)    Resp 12    Ht 5' 7.5" (1.715 m)    Wt 194 lb 8 oz (88.2 kg)    SpO2 97%    BMI 30.01 kg/m  Wt Readings from Last 3 Encounters:  08/26/21 194 lb 8 oz (88.2 kg)  08/26/21 192 lb (87.1 kg)  03/18/21 192 lb 8 oz (87.3 kg)    Health Maintenance Due  Topic Date Due   Hepatitis C Screening  Never done   HEMOGLOBIN A1C  07/14/2021   OPHTHALMOLOGY EXAM  08/06/2021    There are no preventive care reminders to display for this patient.   Lab Results  Component Value Date   TSH 1.40 05/14/2021   Lab Results  Component Value Date   WBC 8.8 05/14/2021   HGB 15.0 05/14/2021   HCT 48 05/14/2021   MCV 81.8 01/01/2021   PLT 175 05/14/2021   Lab Results  Component Value Date   NA 141 05/14/2021   K 3.6 05/14/2021   CO2 33 (A)  05/14/2021   GLUCOSE 140 (H) 01/12/2021   BUN 13 05/14/2021   CREATININE 1.2 05/14/2021   BILITOT 1.1 01/12/2021   ALKPHOS 103 01/12/2021   AST 27 01/12/2021   ALT 27 01/12/2021   PROT 7.9 01/12/2021   ALBUMIN 4.3 01/12/2021   CALCIUM 9.8 05/14/2021   ANIONGAP 7 01/01/2021   GFR 61.16 01/12/2021   Lab Results  Component Value Date   CHOL 109 01/12/2021   Lab Results  Component Value Date   HDL 51.50 01/12/2021   Lab Results  Component Value Date   LDLCALC 41 01/12/2021   Lab Results  Component Value Date   TRIG 79.0 01/12/2021   Lab Results  Component Value Date   CHOLHDL 2 01/12/2021   Lab Results  Component Value Date   HGBA1C 7.3 (H) 01/12/2021       Assessment & Plan:   Problem List Items Addressed This Visit       Genitourinary   BPH (benign prostatic hyperplasia)   Relevant Medications   tamsulosin (FLOMAX) 0.4 MG CAPS capsule     Other   Pain in left shoulder - Primary    Related orthopedist and had injections right knee.  He states felt a little worse after injection did tell patient that this is par for the course after getting a joint injection.  And imaging likely is wearing out he is feeling where they have injected the solution which can  cause tightness and decreased range of motion.  This should resolve over the next 24 to 48 hours to discuss this with patient.  Continue to monitor      Anxiety and depression    PHQ-9 and GAD-7 in office.  Patient recently lost his wife in the last few months.  Seems to be doing okay.  [facility]  Did mention about senior centers in the surrounding areas to get out and socialize and making friends.  I will look into this for the patient.      Relevant Medications   sertraline (ZOLOFT) 25 MG tablet   Chronic pain of right knee    Related orthopedist and had injections right knee.  He states felt a little worse after injection did tell patient that this is par  for the course after getting a joint injection.  And imaging likely is wearing out he is feeling where they have injected the solution which can cause tightness and decreased range of motion.  This should resolve over the next 24 to 48 hours to discuss this with patient.  Continue to monitor      Grief    Office this month.  States does not have any family around.  Did administer PHQ-9 and GAD-7 in office does have some depression will treat with Zoloft 25 mg nightly.      Relevant Medications   sertraline (ZOLOFT) 25 MG tablet   Patient is seen by the [place] every 3 months per patient report. He was just seen by them and clams to have labs done along with a bone scan to r/o paget's disease. We will get the latest office visit from the [place].    Meds ordered this encounter  Medications   tamsulosin (FLOMAX) 0.4 MG CAPS capsule    Sig: Take 2 capsules (0.8 mg total) by mouth daily.    Dispense:  180 capsule    Refill:  0   sertraline (ZOLOFT) 25 MG tablet    Sig: Take 1 tablet (25 mg total) by mouth daily.    Dispense:  30 tablet    Refill:  0    Order Specific Question:   Supervising Provider    Answer:   Loura Pardon A [[id number]   This visit occurred during the SARS-CoV-2 public health emergency.  Safety protocols were in place, including screening questions prior to the visit, additional usage of staff PPE, and extensive cleaning of exam room while observing appropriate contact time as indicated for disinfecting solutions.    Romilda Garret, NP

## 2021-08-26 NOTE — Patient Instructions (Addendum)
Nice to see you today The joints should start feeling better by tomorrow I want to see you in 6 weeks for your physical with me. I think this medication can help with your sleep too. If you start having thoughts of harming yourself or others let me know or call "[phone]"

## 2021-08-26 NOTE — Assessment & Plan Note (Signed)
Office this month.  States does not have any family around.  Did administer PHQ-9 and GAD-7 in office does have some depression will treat with Zoloft 25 mg nightly.

## 2021-08-26 NOTE — Assessment & Plan Note (Signed)
PHQ-9 and GAD-7 in office.  Patient recently lost his wife in the last few months.  Seems to be doing okay.  [facility]  Did mention about senior centers in the surrounding areas to get out and socialize and making friends.  I will look into this for the patient.

## 2021-08-26 NOTE — Progress Notes (Signed)
Office Visit Note   Patient: James Pearson           Date of Birth: 10/01/1945           MRN: [id number] Visit Date: 08/26/2021              Requested by: Michela Pitcher, NP [address],  [place] [id number] PCP: Michela Pitcher, NP   Assessment & Plan: Visit Diagnoses: Right knee pain.  Left shoulder pain  Plan: James Pearson is a pleasant 76 year old gentleman with a history of bilateral knee arthritis and left shoulder pain.  He is gotten injections in the past of steroid and done well.  He did not want to pursue any further treatment and injections as his wife is very ill.  He presents today requesting injections into his right knee and left shoulder he has a history of advanced arthritis in both of his knees.  Also impingement findings have been found in his shoulder.  He declined further diagnostics on his left shoulder specifically an MRI to evaluate the rotator cuff because his wife is since passed away and he says if he had to have surgery he would have no one to care for him.  We will inject him today he understands this may affect his blood sugars temporarily.  He may call if he wishes to get an MRI of his left shoulder we will schedule this for him  Follow-Up Instructions: No follow-ups on file.   Orders:  No orders of the defined types were placed in this encounter.  No orders of the defined types were placed in this encounter.     Procedures: Large Joint Inj: L glenohumeral on 08/26/2021 9:06 AM Indications: diagnostic evaluation and pain Details: 25 G 1.5 in needle, anterior approach  Arthrogram: No  Medications: 5 mL lidocaine 1 %; 80 mg methylPREDNISolone acetate 40 MG/ML Outcome: tolerated well, no immediate complications Procedure, treatment alternatives, risks and benefits explained, specific risks discussed. Consent was given by the patient. Immediately prior to procedure a time out was called to verify the correct patient, procedure, equipment,  support staff and site/side marked as required. Patient was prepped and draped in the usual sterile fashion.      Clinical Data: No additional findings.   Subjective: Chief Complaint  Patient presents with   Right Knee - Pain   Left Shoulder - Pain  Patient presents today for recurrent right knee and left shoulder pain. He was injected with cortisone last year. He said that it helped. He is wanting to have both areas injected today.     Review of Systems  All other systems reviewed and are negative.   Objective: Vital Signs: There were no vitals taken for this visit.  Physical Exam Constitutional:      Appearance: Normal appearance.  Eyes:     Extraocular Movements: Extraocular movements intact.  Skin:    General: Skin is warm and dry.  Neurological:     Mental Status: He is alert.    Ortho Exam Examination of his right knee no effusion no erythema no warmth range of motion is fairly good.  Does have some global tenderness.  Good stability.  Left shoulder strength is is good.  With both abduction and abduction.  He does have pain with forward elevation lacks about 10 to 15 degrees of full forward elevation.  Impingement findings reproduces pain no redness no erythema about the shoulder Specialty Comments:  No specialty comments available.  Imaging: No results found.   PMFS History: Patient Active Problem List   Diagnosis Date Noted   Rotator cuff tear arthropathy 06/23/2021   Psychosexual dysfunction with inhibited sexual excitement 06/23/2021   Obesity 06/23/2021   Benign neoplasm of colon 06/23/2021   Carpal tunnel syndrome 06/23/2021   History of adenomatous polyp of colon 06/23/2021   Brachial neuritis 06/23/2021   Parkinson's disease ([place]) 06/23/2021   Dysuria 03/18/2021   Low testosterone in male 03/18/2021   Hypokalemia 01/06/2021   Pain in left shoulder 11/05/2020   Bilateral primary osteoarthritis of knee 12/19/2019   Unilateral primary  osteoarthritis, right knee 10/30/2019   Insomnia 05/06/2015   Essential hypertension 05/06/2015   Chronic back pain 05/06/2015   Type 2 diabetes mellitus without complication ([place]) 64/68/0321   Stroke ([place]) 05/06/2015   HLD (hyperlipidemia) 05/06/2015   BPH (benign prostatic hyperplasia) 05/06/2015   Esophageal reflux 05/06/2015   History of TB (tuberculosis) 05/06/2015   Iron deficiency 04/10/2015   Past Medical History:  Diagnosis Date   Arthritis    Hepatitis    Hypertension    Positive TB test    as a child   Stroke ([place]) 2013    Family History  Problem Relation Age of Onset   Heart disease Mother    Hypertension Mother     Past Surgical History:  Procedure Laterality Date   CHOLECYSTECTOMY     HAND SURGERY     rotator cuff surgery     Social History   Occupational History   Not on file  Tobacco Use   Smoking status: Former    Types: Cigarettes    Quit date: 08/09/1990    Years since quitting: 31.0   Smokeless tobacco: Never  Substance and Sexual Activity   Alcohol use: No    Alcohol/week: 0.0 standard drinks   Drug use: No   Sexual activity: Yes

## 2021-09-01 ENCOUNTER — Telehealth: Payer: Self-pay | Admitting: Nurse Practitioner

## 2021-09-01 ENCOUNTER — Encounter: Payer: Self-pay | Admitting: Nurse Practitioner

## 2021-09-01 ENCOUNTER — Encounter: Payer: Self-pay | Admitting: Internal Medicine

## 2021-09-01 NOTE — Telephone Encounter (Signed)
I was reviewing Mr. Haywoods records from the [place] and they have him on Citalopram. We do not have that on our list. I ordered him sertraline. He does NOT need to be on both medications. We need to call and find out if he is taking the citalopram 20mg  0.5 tabs daily, if so he needs to stop the sertraline (zoloft) I wrote him.  Thanks

## 2021-09-01 NOTE — Telephone Encounter (Signed)
1) Spoke with patient. Patient states he took Citalopram one time before but then tossed the bottle out because at that time his wife was alive and had similar medication he had to prepare for her and did not want to mix medications up -his and hers. He has not started Sertraline yet because he is waiting for mail order pharmacy to mail medications to him. I did advise patient that he needs to call [facility] to make sure there is nothing that they are waiting on from him since it is his first time using this pharmacy. Provided their phone number and asked patient to let me know if there are any issues.  2) patient states he is having a cough with white phlegm and some runny nose and post nasal drip that started on 08/29/21. Patient was advised to use Mucinnex -patient has not been taking this, and Flonase nasal spray. Patient was advised and offered an appointment for his symptoms if he is feeling worse or concerned. Patient will keep [place] updated.

## 2021-09-03 NOTE — Telephone Encounter (Signed)
Can we find out which shoulder this is? He was recently evaluated at ortho and got a shoulder injection. Has the bump been there before the injection or just after the injection if this is the same shoulder. Also if it is the same shoulder he would need to follow up with ortho for further evaluation

## 2021-09-11 ENCOUNTER — Other Ambulatory Visit: Payer: Self-pay | Admitting: Nurse Practitioner

## 2021-09-11 DIAGNOSIS — F419 Anxiety disorder, unspecified: Secondary | ICD-10-CM

## 2021-09-11 DIAGNOSIS — F4321 Adjustment disorder with depressed mood: Secondary | ICD-10-CM

## 2021-09-11 DIAGNOSIS — F32A Depression, unspecified: Secondary | ICD-10-CM

## 2021-09-14 MED ORDER — SERTRALINE HCL 25 MG PO TABS: 25.0000 mg | ORAL_TABLET | Freq: Every day | ORAL | 0 refills | Status: DC

## 2021-09-18 ENCOUNTER — Ambulatory Visit
Admission: RE | Admit: 2021-09-18 | Discharge: 2021-09-18 | Disposition: A | Discharge status: Home / Self Care | Payer: Medicare Other | Source: Ambulatory Visit | Attending: Nurse Practitioner | Admitting: Nurse Practitioner

## 2021-09-18 ENCOUNTER — Other Ambulatory Visit: Payer: Self-pay

## 2021-09-18 ENCOUNTER — Encounter: Payer: Self-pay | Admitting: Nurse Practitioner

## 2021-09-18 ENCOUNTER — Ambulatory Visit (INDEPENDENT_AMBULATORY_CARE_PROVIDER_SITE_OTHER): Payer: Medicare Other | Admitting: Nurse Practitioner

## 2021-09-18 VITALS — BP 132/64 | HR 90 | Temp 98.3°F | Resp 12 | Ht 67.5 in | Wt 197.6 lb

## 2021-09-18 DIAGNOSIS — M25552 Pain in left hip: Secondary | ICD-10-CM

## 2021-09-18 DIAGNOSIS — R251 Tremor, unspecified: Secondary | ICD-10-CM

## 2021-09-18 NOTE — Progress Notes (Signed)
Acute Office Visit  Subjective:    Patient ID: James Pearson, male    DOB: [birth year], 76 y.o.   MRN: [id number]  Chief Complaint  Patient presents with   Hip Pain    Left hip, x few months. No injury. Pain does radiate to the back of left leg. Oxycodone medication does not help the pain.    Tremors    In the right hand getting worse. Has been present for years. Has been diagnosed by Dr Georgiann Cocker in [place] with severe Parkinsons, he use to shake all over his body and shuffled with his walk.    Hip Pain  Pertinent negatives include no numbness.  Patient is in today for Hip pain  States that it is his left hip for a few months, been getting worse.  No injury that he is aware of. Worse with movement. States the pain is like a dull ache that is intermittent. No new numbness or tingling. Feels weak.  He has tried a salve  that has CBD and menthol and it has helped. Has tried oxycodone and it does not help with the pain.  Tremor: Dx with parkinsons disease when he lived in [place]. Has not seen a neurology since being in [place]. States that he was placed on a medication that was in a trial. No shuffling gait in office. Some antaglic gait due to to left hip discomfort. Past Medical History:  Diagnosis Date   Arthritis    GERD (gastroesophageal reflux disease)    Hepatitis    Hypertension    Parkinson's disease ([place])    Positive TB test    as a child   Stroke ([place]) 08/10/2011    Past Surgical History:  Procedure Laterality Date   CHOLECYSTECTOMY     HAND SURGERY     rotator cuff surgery      Family History  Problem Relation Age of Onset   Heart disease Mother    Hypertension Mother     Social History   Socioeconomic History   Marital status: Widowed    Spouse name: Not on file   Number of children: Not on file   Years of education: Not on file   Highest education level: Not on file  Occupational History   Not on file  Tobacco Use   Smoking status:  Former    Types: Cigarettes    Quit date: 08/09/1990    Years since quitting: 31.1   Smokeless tobacco: Never  Substance and Sexual Activity   Alcohol use: No    Alcohol/week: 0.0 standard drinks   Drug use: No   Sexual activity: Yes  Other Topics Concern   Not on file  Social History Narrative   Not on file   Social Determinants of Health   Financial Resource Strain: Not on file  Food Insecurity: Not on file  Transportation Needs: Not on file  Physical Activity: Not on file  Stress: Not on file  Social Connections: Not on file  Intimate Partner Violence: Not on file    Outpatient Medications Prior to Visit  Medication Sig Dispense Refill   amLODipine (NORVASC) 10 MG tablet Take 1 tablet (10 mg total) by mouth daily. 90 tablet 1   aspirin 81 MG chewable tablet Chew 81 mg by mouth daily.     atorvastatin (LIPITOR) 80 MG tablet Take 1 tablet (80 mg total) by mouth daily. (Patient taking differently: Take 80 mg by mouth at bedtime.) 90 tablet 1   CALCIUM-VITAMIN D  PO Take by mouth. Calcium 600 mg and vitamin d 400 mg     clobetasol (TEMOVATE) 0.05 % external solution Apply 1 application topically 2 (two) times daily. Apply to affected areas on scalp until itchy rash clears/ PRN 50 mL 0   cyclobenzaprine (FLEXERIL) 5 MG tablet Take 1 tablet (5 mg total) by mouth at bedtime as needed for muscle spasms. 15 tablet 0   enalapril (VASOTEC) 20 MG tablet Take 1 tablet (20 mg total) by mouth daily. (Patient taking differently: Take 20 mg by mouth at bedtime.) 90 tablet 0   Fluocinolone Acetonide Body 0.01 % OIL Apply to scalp, face and ears QD to BID (Patient taking differently: Apply 1 application topically daily as needed (irritation).) 120 mL 5   hydrochlorothiazide (HYDRODIURIL) 25 MG tablet Take 1 tablet (25 mg total) by mouth daily. 90 tablet 1   ketoconazole (NIZORAL) 2 % shampoo APPLY THREE TIMES WEEKLY, LET SIT SEVERAL MINUTES BEFORE RINSING. (Patient taking differently: Apply 1  application topically as needed for irritation.) 120 mL 3   oxyCODONE (OXY IR/ROXICODONE) 5 MG immediate release tablet Take 5 mg by mouth every 4 (four) hours as needed for severe pain.     sertraline (ZOLOFT) 25 MG tablet Take 1 tablet (25 mg total) by mouth daily. 90 tablet 0   sildenafil (VIAGRA) 100 MG tablet Take 50-100 mg by mouth daily as needed for erectile dysfunction.     Specialty Vitamins Products (PROSTATE PO) Take 1 capsule by mouth at bedtime.     tamsulosin (FLOMAX) 0.4 MG CAPS capsule Take 2 capsules (0.8 mg total) by mouth daily. 180 capsule 0   testosterone cypionate (DEPOTESTOSTERONE CYPIONATE) 200 MG/ML injection Inject 1 mL into the muscle every 14 (fourteen) days.     tobramycin-dexamethasone (TOBRADEX) ophthalmic solution every 4 (four) hours while awake.     traZODone (DESYREL) 50 MG tablet Take 1 tablet (50 mg total) by mouth at bedtime as needed. for sleep (Patient taking differently: Take 50 mg by mouth at bedtime.) 90 tablet 3   TURMERIC PO Take 1 capsule by mouth daily.     No facility-administered medications prior to visit.    Allergies  Allergen Reactions   Omeprazole Anaphylaxis   Gabapentin    Ibuprofen Nausea And Vomiting    Other reaction(s): Low blood pressure, NAUSEA,VOMITING, upset stomach   Ketorolac Tromethamine     Other reaction(s): Low blood pressure   Meloxicam Nausea And Vomiting   Morphine Nausea And Vomiting   Nsaids Nausea And Vomiting   Oxybutynin Chloride     Other reaction(s): Xerostomia, Dry eyes, Dry skin, Xerostomia, Dry eyes, Dry skin, Xerostomia, Dry eyes, Dry skin   Propranolol     Review of Systems  Constitutional:  Negative for chills and fever.  Musculoskeletal:  Positive for arthralgias and back pain. Negative for gait problem and joint swelling.  Neurological:  Positive for weakness. Negative for numbness.       Right handed tremor       Objective:    Physical Exam Vitals and nursing note reviewed.   Constitutional:      Appearance: Normal appearance.  Cardiovascular:     Rate and Rhythm: Normal rate and regular rhythm.     Pulses: Normal pulses.  Pulmonary:     Effort: Pulmonary effort is normal.     Breath sounds: Normal breath sounds.  Abdominal:     General: Bowel sounds are normal.  Musculoskeletal:     Lumbar back: Bony tenderness  present. Negative right straight leg raise test and negative left straight leg raise test.     Right hip: Normal.     Left hip: Tenderness present. Normal strength.     Right lower leg: Edema present.     Left lower leg: Edema present.       Legs:  Neurological:     Mental Status: He is alert.     Deep Tendon Reflexes:     Reflex Scores:      Bicep reflexes are 1+ on the right side and 1+ on the left side.      Patellar reflexes are 1+ on the right side and 1+ on the left side.    Comments: Bilateral lower extremity strength 5/5  Minimal cogwheel rigidity on right side    BP 132/64    Pulse 90    Temp 98.3 F (36.8 C)    Resp 12    Ht 5' 7.5" (1.715 m)    Wt 197 lb 9 oz (89.6 kg)    SpO2 97%    BMI 30.49 kg/m  Wt Readings from Last 3 Encounters:  09/18/21 197 lb 9 oz (89.6 kg)  08/26/21 194 lb 8 oz (88.2 kg)  08/26/21 192 lb (87.1 kg)    Health Maintenance Due  Topic Date Due   Hepatitis C Screening  Never done   OPHTHALMOLOGY EXAM  08/06/2021    There are no preventive care reminders to display for this patient.   Lab Results  Component Value Date   TSH 1.40 05/14/2021   Lab Results  Component Value Date   WBC 8.8 05/14/2021   HGB 15.0 05/14/2021   HCT 48 05/14/2021   MCV 81.8 01/01/2021   PLT 175 05/14/2021   Lab Results  Component Value Date   NA 141 05/14/2021   K 3.6 05/14/2021   CO2 33 (A) 05/14/2021   GLUCOSE 140 (H) 01/12/2021   BUN 13 05/14/2021   CREATININE 1.2 05/14/2021   BILITOT 1.1 01/12/2021   ALKPHOS 103 01/12/2021   AST 27 01/12/2021   ALT 27 01/12/2021   PROT 7.9 01/12/2021   ALBUMIN 4.3  01/12/2021   CALCIUM 9.8 05/14/2021   ANIONGAP 7 01/01/2021   GFR 61.16 01/12/2021   Lab Results  Component Value Date   CHOL 109 01/12/2021   Lab Results  Component Value Date   HDL 51.50 01/12/2021   Lab Results  Component Value Date   LDLCALC 41 01/12/2021   Lab Results  Component Value Date   TRIG 79.0 01/12/2021   Lab Results  Component Value Date   CHOLHDL 2 01/12/2021   Lab Results  Component Value Date   HGBA1C 6.7 08/06/2021       Assessment & Plan:   Problem List Items Addressed This Visit       Other   Tremor    Did not seem to trigger office.  Patient states he has a stable diagnosis of Parkinson disease and was placed on investigative medicine many years ago when he lived in [place].  Slight cogwheel rigidity in office gait normal will refer patient to neurology for further evaluation.  Patient acknowledged and on board with plan      Relevant Orders   Ambulatory referral to Neurology   Left hip pain - Primary    Has been for several months intermittent.  Does have history of [facility] trouble [facility]  Pending left hip x-ray.  Continue  using over-the-counter salves as needed taking medication as prescribed.  Depending on x-ray results may be consider burst of steroids      Relevant Orders   DG Hip Unilat W OR W/O Pelvis 2-3 Views Left     No orders of the defined types were placed in this encounter.  This visit occurred during the SARS-CoV-2 public health emergency.  Safety protocols were in place, including screening questions prior to the visit, additional usage of staff PPE, and extensive cleaning of exam room while observing appropriate contact time as indicated for disinfecting solutions.    Romilda Garret, NP

## 2021-09-18 NOTE — Assessment & Plan Note (Signed)
Did not seem to trigger office.  Patient states he has a stable diagnosis of Parkinson disease and was placed on investigative medicine many years ago when he lived in [place].  Slight cogwheel rigidity in office gait normal will refer patient to neurology for further evaluation.  Patient acknowledged and on board with plan

## 2021-09-18 NOTE — Assessment & Plan Note (Signed)
Has been for several months intermittent.  Does have history of [facility] trouble [facility].  Pending left hip x-ray.  Continue using over-the-counter salves as needed taking medication as prescribed.  Depending on x-ray results may be consider burst of steroids

## 2021-09-18 NOTE — Patient Instructions (Signed)
Nice to see you today Continue taking medications as prescribed Continue using the cream and prescribed pain medication I will be in touch once I get the xray results

## 2021-09-21 ENCOUNTER — Encounter: Payer: Self-pay | Admitting: Nurse Practitioner

## 2021-09-21 ENCOUNTER — Encounter: Payer: Self-pay | Admitting: Orthopaedic Surgery

## 2021-09-22 ENCOUNTER — Telehealth: Payer: Self-pay | Admitting: Orthopaedic Surgery

## 2021-09-22 NOTE — Telephone Encounter (Signed)
Patient called asked if he can get a cortisone injection in his left hip? Patient has cortisone injections 08/26/2021 in his shoulder and knee. Patient also said he is having a testosterone injection in his left thigh 09/26/2021 and asked if that will hurt him in anyway?  The number to contact patient is [phone]

## 2021-09-23 ENCOUNTER — Other Ambulatory Visit: Payer: Self-pay

## 2021-09-23 ENCOUNTER — Ambulatory Visit: Payer: Medicare Other | Admitting: Physician Assistant

## 2021-09-23 ENCOUNTER — Encounter: Payer: Self-pay | Admitting: Physician Assistant

## 2021-09-23 DIAGNOSIS — M25552 Pain in left hip: Secondary | ICD-10-CM | POA: Diagnosis not present

## 2021-09-23 NOTE — Progress Notes (Signed)
Office Visit Note   Patient: James Pearson           Date of Birth: 12/26/1945           MRN: [id number] Visit Date: 09/23/2021              Requested by: Michela Pitcher, NP [place],  [place] [address] PCP: Michela Pitcher, NP  Chief Complaint  Patient presents with   Left Hip - Pain      HPI: Mr. Canning is a pleasant 76 year old gentleman who is a patient of Dr. Rudene Anda.  He has had injections into his shoulder most recently in January.  He does have rotator cuff tear however he recently lost his wife and does not have anyone to care for him if he had surgery.  He is also has bilateral knee arthritis.  He comes in today because he is states he has been having left groin pain that may go back to when his wife was still alive in October.  He denies any injury and he did not really notice it too much because he was taking care of his wife.  He denies any paresthesias any loss of bowel or bladder control.  He does have a history of lumbar back issues however he says this is different.  He cannot take anti-inflammatories because they make him ill.  He notices the pain when he tries to flex his hip up is reproduced in the groin.  He says if he is in a low chair he has to stretch his leg out and push up to reach a standing position so he does not flex his hip as this causes a sharp pain.  He has been seen by his primary care provider who ruled out an inguinal hernia  Assessment & Plan: Visit Diagnoses:  1. Pain in left hip     Plan: I think most likely this is secondary to hip issue.  He just has mild arthritis but certainly his exam is more consistent with his hip.  We will have him get a steroid injection to see if this is helpful.  If that does not help and we could revisit his spine.  Follow-Up Instructions: No follow-ups on file.   Ortho Exam  Patient is alert, oriented, no adenopathy, well-dressed, normal affect, normal respiratory effort. Examination no  tenderness in his spine no posterior buttock pain he has 5 out of 5 strength with flexion extension of his legs and ankles and flexion of his hips.  Flexion of the left hip does reproduce pain in his groin.  No significant pain with internal/external rotation of his hip.  No pain on the lateral side of his left leg.  Distal sensation is intact  Imaging: No results found. No images are attached to the encounter.  Labs: Lab Results  Component Value Date   HGBA1C 6.7 08/06/2021   HGBA1C 7.3 (H) 01/12/2021   HGBA1C 6.8 (H) 01/22/2019     Lab Results  Component Value Date   ALBUMIN 4.3 01/12/2021   ALBUMIN 4.2 01/22/2019    No results found for: MG Lab Results  Component Value Date   VD25OH 48.4 05/14/2021    No results found for: PREALBUMIN CBC EXTENDED Latest Ref Rng & Units 05/14/2021 01/01/2021 01/22/2019  WBC - 8.8 8.9 6.3  RBC 3.87 - 5.11 3.8(A) 5.72 6.00(H)  HGB 13.5 - 17.5 15.0 15.0 15.2  HCT 41 - 53 48 46.8 47.0  PLT  150 - 399 175 269 189.0     There is no height or weight on file to calculate BMI.  Orders:  Orders Placed This Encounter  Procedures   DL FLUORO GUIDED NEEDLE [employer] / INJECTTION/LOC   No orders of the defined types were placed in this encounter.    Procedures: No procedures performed  Clinical Data: No additional findings.  ROS:  All other systems negative, except as noted in the HPI. Review of Systems  Objective: Vital Signs: There were no vitals taken for this visit.  Specialty Comments:  No specialty comments available.  PMFS History: Patient Active Problem List   Diagnosis Date Noted   Tremor 09/18/2021   Left hip pain 09/18/2021   Anxiety and depression 08/26/2021   Chronic pain of right knee 08/26/2021   Grief 08/26/2021   Rotator cuff tear arthropathy 06/23/2021   Psychosexual dysfunction with inhibited sexual excitement 06/23/2021   Obesity 06/23/2021   Benign neoplasm of colon 06/23/2021   Carpal tunnel  syndrome 06/23/2021   History of adenomatous polyp of colon 06/23/2021   Brachial neuritis 06/23/2021   Parkinson's disease ([place]) 06/23/2021   Dysuria 03/18/2021   Low testosterone in male 03/18/2021   Hypokalemia 01/06/2021   Pain in left shoulder 11/05/2020   Bilateral primary osteoarthritis of knee 12/19/2019   Unilateral primary osteoarthritis, right knee 10/30/2019   Insomnia 05/06/2015   Essential hypertension 05/06/2015   Chronic back pain 05/06/2015   Type 2 diabetes mellitus without complication ([place]) 05/15/1218   Stroke ([place]) 05/06/2015   HLD (hyperlipidemia) 05/06/2015   BPH (benign prostatic hyperplasia) 05/06/2015   Esophageal reflux 05/06/2015   History of TB (tuberculosis) 05/06/2015   Iron deficiency 04/10/2015   Past Medical History:  Diagnosis Date   Arthritis    GERD (gastroesophageal reflux disease)    Hepatitis    Hypertension    Parkinson's disease ([place])    Positive TB test    as a child   Stroke ([place]) 08/10/2011    Family History  Problem Relation Age of Onset   Heart disease Mother    Hypertension Mother     Past Surgical History:  Procedure Laterality Date   CHOLECYSTECTOMY     HAND SURGERY     rotator cuff surgery     Social History   Occupational History   Not on file  Tobacco Use   Smoking status: Former    Types: Cigarettes    Quit date: 08/09/1990    Years since quitting: 31.1   Smokeless tobacco: Never  Substance and Sexual Activity   Alcohol use: No    Alcohol/week: 0.0 standard drinks   Drug use: No   Sexual activity: Yes

## 2021-09-25 ENCOUNTER — Other Ambulatory Visit: Payer: Medicare Other

## 2021-09-30 ENCOUNTER — Other Ambulatory Visit: Payer: Self-pay

## 2021-09-30 ENCOUNTER — Ambulatory Visit
Admission: RE | Admit: 2021-09-30 | Discharge: 2021-09-30 | Disposition: A | Discharge status: Home / Self Care | Payer: Medicare Other | Source: Ambulatory Visit | Attending: Physician Assistant | Admitting: Physician Assistant

## 2021-09-30 DIAGNOSIS — M25552 Pain in left hip: Secondary | ICD-10-CM | POA: Diagnosis not present

## 2021-09-30 MED ORDER — METHYLPREDNISOLONE ACETATE 40 MG/ML INJ SUSP (RADIOLOG
80.0000 mg | Freq: Once | INTRAMUSCULAR | Status: AC
  Administered 2021-09-30: 80 mg via INTRA_ARTICULAR

## 2021-09-30 MED ORDER — IOPAMIDOL (ISOVUE-M 200) INJECTION 41%
1.0000 mL | Freq: Once | INTRAMUSCULAR | Status: AC
  Administered 2021-09-30: 1 mL via INTRA_ARTICULAR

## 2021-10-08 ENCOUNTER — Telehealth: Payer: Self-pay

## 2021-10-08 MED ORDER — PENICILLIN V POTASSIUM 500 MG PO TABS: 500.0000 mg | ORAL_TABLET | Freq: Three times a day (TID) | ORAL | 0 refills | Status: AC

## 2021-10-08 NOTE — Addendum Note (Signed)
Addended by: Michela Pitcher on: 10/08/2021 01:59 PM ? ? Modules accepted: Orders ? ?

## 2021-10-08 NOTE — Telephone Encounter (Signed)
Pt stated he had a tooth pulled and wanted to know if you can call him in amoxocillan med ?

## 2021-10-08 NOTE — Telephone Encounter (Signed)
Spoke with patient. Patient states he has been having trouble with his too hurting off and on since November 2022-its #20 tooth on the bottom, patient has seen his dentist and he has to get root canal done to get this fixed. It will cost him $800 out of pocket and he is working on getting this money. He woke up with severe pain at 3 am today in that tooth. He would like to know if we can help him and give him Amoxicillin which he had before for this to help. He is not having facial swelling or redness. Its just that tooth. He has taking some Tylenol but states he is not suppose to take much of that.Tylenol has not helped him much. ?Patient does have an appointment here tomorrow for CPE. ?Please advise. ?

## 2021-10-08 NOTE — Telephone Encounter (Addendum)
I can send in an empiric script and will evaluate him tomorrow at the office visit. I sent in some penicllin VK 500mg  he will take it three times a day for 5 days. I will assess him at [place] visit ?

## 2021-10-08 NOTE — Telephone Encounter (Signed)
Patient advised and he will be here tomorrow ?

## 2021-10-08 NOTE — Telephone Encounter (Signed)
See phone note

## 2021-10-09 ENCOUNTER — Encounter: Payer: Self-pay | Admitting: Nurse Practitioner

## 2021-10-09 ENCOUNTER — Ambulatory Visit (INDEPENDENT_AMBULATORY_CARE_PROVIDER_SITE_OTHER): Payer: Medicare Other | Admitting: Nurse Practitioner

## 2021-10-09 ENCOUNTER — Other Ambulatory Visit: Payer: Self-pay

## 2021-10-09 VITALS — BP 110/60 | HR 84 | Temp 97.8°F | Resp 12 | Ht 67.5 in | Wt 188.4 lb

## 2021-10-09 DIAGNOSIS — Z125 Encounter for screening for malignant neoplasm of prostate: Secondary | ICD-10-CM

## 2021-10-09 DIAGNOSIS — F5101 Primary insomnia: Secondary | ICD-10-CM

## 2021-10-09 DIAGNOSIS — R600 Localized edema: Secondary | ICD-10-CM | POA: Insufficient documentation

## 2021-10-09 DIAGNOSIS — E119 Type 2 diabetes mellitus without complications: Secondary | ICD-10-CM | POA: Diagnosis not present

## 2021-10-09 DIAGNOSIS — G8929 Other chronic pain: Secondary | ICD-10-CM

## 2021-10-09 DIAGNOSIS — G20 Parkinson's disease: Secondary | ICD-10-CM

## 2021-10-09 DIAGNOSIS — R7989 Other specified abnormal findings of blood chemistry: Secondary | ICD-10-CM

## 2021-10-09 DIAGNOSIS — M545 Low back pain, unspecified: Secondary | ICD-10-CM

## 2021-10-09 DIAGNOSIS — I639 Cerebral infarction, unspecified: Secondary | ICD-10-CM | POA: Diagnosis not present

## 2021-10-09 DIAGNOSIS — K0889 Other specified disorders of teeth and supporting structures: Secondary | ICD-10-CM

## 2021-10-09 DIAGNOSIS — F32A Depression, unspecified: Secondary | ICD-10-CM

## 2021-10-09 DIAGNOSIS — N400 Enlarged prostate without lower urinary tract symptoms: Secondary | ICD-10-CM

## 2021-10-09 DIAGNOSIS — I10 Essential (primary) hypertension: Secondary | ICD-10-CM | POA: Diagnosis not present

## 2021-10-09 DIAGNOSIS — Z0000 Encounter for general adult medical examination without abnormal findings: Secondary | ICD-10-CM | POA: Insufficient documentation

## 2021-10-09 DIAGNOSIS — F419 Anxiety disorder, unspecified: Secondary | ICD-10-CM

## 2021-10-09 DIAGNOSIS — E7800 Pure hypercholesterolemia, unspecified: Secondary | ICD-10-CM

## 2021-10-09 LAB — PSA: PSA: 1.33 ng/mL (ref <=4.00)

## 2021-10-09 MED ORDER — DULOXETINE HCL 20 MG PO CPEP: 20.0000 mg | ORAL_CAPSULE | Freq: Every day | ORAL | 2 refills | Status: DC

## 2021-10-09 NOTE — Assessment & Plan Note (Signed)
Diagnosis that patient states he has and has been on medication in the past.  Did refer him to neurology still has not heard back yet. ?

## 2021-10-09 NOTE — Assessment & Plan Note (Signed)
Currently on baby aspirin and high-dose statin therapy ?

## 2021-10-09 NOTE — Assessment & Plan Note (Signed)
Left lower dental pain.  Upon further exam seems to be more of gum where patient's partial lays will continue the penicillin VK and did give instructions to try to give jaw and gum break when he is at home and not eating.  Patient is established with dentist and is trying to get the money to have tooth extracted.  Follow-up with no improvement. ?

## 2021-10-09 NOTE — Assessment & Plan Note (Signed)
Pending labs today.  Patient currently maintained with diet and exercise. ?

## 2021-10-09 NOTE — Assessment & Plan Note (Signed)
Continue taking hydrochlorothiazide, enalapril, and amlodipine. ?

## 2021-10-09 NOTE — Assessment & Plan Note (Signed)
History of CVA.  Patient is currently on atorvastatin 80 mg.  Pending lab results.  Patient also does really well with exercise continue healthy lifestyle modifications.  Patient also does really well with diet and exercise.  Continue healthy lifestyle modifications. ?

## 2021-10-09 NOTE — Assessment & Plan Note (Signed)
Patient is evaluated at [facility].  He has written oxycodone through them.  Was being seen every 3 months but is now stretched to every 6 months.  Continue taking medication as prescribed and follow-up with [facility]. ?

## 2021-10-09 NOTE — Assessment & Plan Note (Signed)
Patient has bilateral pitting edema.  Right greater than left.  Patient has had BNP in the past that was negative.  We will repeat labs today inclusive of BNP.  This could be related to patient's amlodipine 10 mg.  Continue to monitor pending lab results. ?

## 2021-10-09 NOTE — Progress Notes (Signed)
Established Patient Office Visit  Subjective:  Patient ID: James Pearson, male    DOB: 01/07/1946  Age: 76 y.o. MRN: [id number]  CC:  Chief Complaint  Patient presents with   Annual Exam    HPI James Pearson presents for complete physical and follow up of chronic conditions.  Immunizations: -Tetanus: 2017 -Influenza: 05/14/2021 -Covid-19:pfizer x 2 and 2 boosters -Shingles: UTD -Pneumonia: UTD  -HPV: Aged out  Diet: [place]. States that his eating has improved. States he will make some frozen dinner and maybe a salad. Water and hot tea. Will have snapples and [place] teas Exercise: Goes to gym 5 days a week and will spend 2 hours in the gym. He will do walking and walk for 70 minutes.  Eye exam: Completes annually. Jan 2023  Dental exam: Completes semi-annually LTR  Partial plate on the bottom.  Dexa: Completed in NA [facility]  PSA: Due  Lung Cancer Screening: NA    Sleep: States that he tries to go to bed and will sleep 4.5-5. States that it is broken sleep. Does not feel rested.   Dental pain: Started approx 3 days. States he needs a root canal but is trying to get it extracted. Has been using orajel. Has been taking his tylenol ( although he is not suppose to) tyelnol does not seem to help. Pian is intermittent   HTN: does not check blood pressure at home  DM2: states he does not check his glucose it is controlled with diet. Last time he checked it was 109 and [place] 10/09/2021 08/26/2021 11/03/2020  PHQ-9 Total Score 21 14 0    GAD 7 : Generalized Anxiety Score 08/26/2021  Nervous, Anxious, on Edge 2  Control/stop worrying 1  Worry too much - different things 3  Trouble relaxing 2  Restless 0  Easily annoyed or irritable 2  Afraid - awful might happen 3  Total GAD 7 Score 13      Past Medical History:  Diagnosis Date   Arthritis    GERD (gastroesophageal reflux disease)    Hepatitis     Hypertension    Parkinson's disease ([place])    Positive TB test    as a child   Stroke ([place]) 08/10/2011    Past Surgical History:  Procedure Laterality Date   CHOLECYSTECTOMY     HAND SURGERY     rotator cuff surgery      Family History  Problem Relation Age of Onset   Heart disease Mother    Hypertension Mother     Social History   Socioeconomic History   Marital status: Widowed    Spouse name: Not on file   Number of children: Not on file   Years of education: Not on file   Highest education level: Not on file  Occupational History   Not on file  Tobacco Use   Smoking status: Former    Types: Cigarettes    Quit date: 08/09/1990    Years since quitting: 31.1   Smokeless tobacco: Never  Substance and Sexual Activity   Alcohol use: No    Alcohol/week: 0.0 standard drinks   Drug use: No   Sexual activity: Yes  Other Topics Concern   Not on file  Social History Narrative   Not on file   Social Determinants of Health   Financial Resource Strain: Not on file  Food Insecurity: Not on file  Transportation Needs: Not on file  Physical Activity: Not on file  Stress: Not on file  Social Connections: Not on file  Intimate Partner Violence: Not on file    Outpatient Medications Prior to Visit  Medication Sig Dispense Refill   amLODipine (NORVASC) 10 MG tablet Take 1 tablet (10 mg total) by mouth daily. 90 tablet 1   aspirin 81 MG chewable tablet Chew 81 mg by mouth daily.     atorvastatin (LIPITOR) 80 MG tablet Take 1 tablet (80 mg total) by mouth daily. (Patient taking differently: Take 80 mg by mouth at bedtime.) 90 tablet 1   CALCIUM-VITAMIN D PO Take by mouth. Calcium 600 mg and vitamin d 400 mg     clobetasol (TEMOVATE) 0.05 % external solution Apply 1 application topically 2 (two) times daily. Apply to affected areas on scalp until itchy rash clears/ PRN 50 mL 0   cyclobenzaprine (FLEXERIL) 5 MG tablet Take 1 tablet (5 mg total) by mouth at bedtime as needed for  muscle spasms. 15 tablet 0   enalapril (VASOTEC) 20 MG tablet Take 1 tablet (20 mg total) by mouth daily. (Patient taking differently: Take 20 mg by mouth at bedtime.) 90 tablet 0   Fluocinolone Acetonide Body 0.01 % OIL Apply to scalp, face and ears QD to BID (Patient taking differently: Apply 1 application topically daily as needed (irritation).) 120 mL 5   hydrochlorothiazide (HYDRODIURIL) 25 MG tablet Take 1 tablet (25 mg total) by mouth daily. 90 tablet 1   ketoconazole (NIZORAL) 2 % shampoo APPLY THREE TIMES WEEKLY, LET SIT SEVERAL MINUTES BEFORE RINSING. (Patient taking differently: Apply 1 application topically as needed for irritation.) 120 mL 3   Oxycodone HCl 10 MG TABS Take by mouth every 4 (four) hours as needed for severe pain. Takes 5 to 10 mg at a time as needed     penicillin v potassium (VEETID) 500 MG tablet Take 1 tablet (500 mg total) by mouth 3 (three) times daily for 5 days. 15 tablet 0   sildenafil (VIAGRA) 100 MG tablet Take 50-100 mg by mouth daily as needed for erectile dysfunction.     Specialty Vitamins Products (PROSTATE PO) Take 1 capsule by mouth at bedtime.     tamsulosin (FLOMAX) 0.4 MG CAPS capsule Take 2 capsules (0.8 mg total) by mouth daily. 180 capsule 0   testosterone cypionate (DEPOTESTOSTERONE CYPIONATE) 200 MG/ML injection Inject 1 mL into the muscle every 14 (fourteen) days.     tobramycin-dexamethasone (TOBRADEX) ophthalmic solution every 4 (four) hours while awake.     traZODone (DESYREL) 50 MG tablet Take 1 tablet (50 mg total) by mouth at bedtime as needed. for sleep (Patient taking differently: Take 50 mg by mouth at bedtime.) 90 tablet 3   TURMERIC PO Take 1 capsule by mouth daily.     sertraline (ZOLOFT) 25 MG tablet Take 1 tablet (25 mg total) by mouth daily. (Patient not taking: Reported on 10/09/2021) 90 tablet 0   No facility-administered medications prior to visit.    Allergies  Allergen Reactions   Omeprazole Anaphylaxis   Gabapentin     Ibuprofen Nausea And Vomiting    Other reaction(s): Low blood pressure, NAUSEA,VOMITING, upset stomach   Ketorolac Tromethamine     Other reaction(s): Low blood pressure   Meloxicam Nausea And Vomiting   Morphine Nausea And Vomiting   Nsaids Nausea And Vomiting   Oxybutynin Chloride     Other reaction(s): Xerostomia, Dry eyes, Dry skin, Xerostomia, Dry eyes, Dry skin, Xerostomia, Dry eyes, Dry skin  Propranolol     ROS Review of Systems  Constitutional:  Negative for chills and fever.  Respiratory:  Negative for cough and shortness of breath.   Cardiovascular:  Positive for leg swelling. Negative for chest pain and palpitations.  Gastrointestinal:  Negative for abdominal pain, diarrhea, nausea and vomiting.       BM daily  Genitourinary:  Negative for difficulty urinating and dysuria.       Nocturia "+" rarely  Neurological:  Negative for dizziness, light-headedness and headaches.  Psychiatric/Behavioral:  Negative for hallucinations and suicidal ideas.      Objective:    Physical Exam Constitutional:      Appearance: Normal appearance.  HENT:     Right Ear: Tympanic membrane, ear canal and external ear normal.     Left Ear: Tympanic membrane, ear canal and external ear normal.     Mouth/Throat:     Mouth: Mucous membranes are moist.     Pharynx: Oropharynx is clear.     Comments: Patient having dental pain to left lower jaw. It seems the gum posterior to tooth is red where patients partial lays Eyes:     Extraocular Movements: Extraocular movements intact.     Pupils: Pupils are equal, round, and reactive to light.  Cardiovascular:     Rate and Rhythm: Normal rate and regular rhythm.     Heart sounds: Normal heart sounds.  Pulmonary:     Effort: Pulmonary effort is normal.     Breath sounds: Normal breath sounds.  Abdominal:     General: Bowel sounds are normal. There is no distension.     Palpations: Abdomen is soft. There is no mass.     Tenderness: There is no  abdominal tenderness.     Hernia: No hernia is present.  Musculoskeletal:     Right lower leg: Edema present.     Left lower leg: Edema present.  Lymphadenopathy:     Cervical: No cervical adenopathy.  Neurological:     General: No focal deficit present.     Mental Status: He is alert.  Psychiatric:        Mood and Affect: Mood normal.        Behavior: Behavior normal.        Thought Content: Thought content normal.        Judgment: Judgment normal.    BP 110/60    Pulse 84    Temp 97.8 F (36.6 C)    Resp 12    Ht 5' 7.5" (1.715 m)    Wt 188 lb 7 oz (85.5 kg)    SpO2 98%    BMI 29.08 kg/m  Wt Readings from Last 3 Encounters:  10/09/21 188 lb 7 oz (85.5 kg)  09/18/21 197 lb 9 oz (89.6 kg)  08/26/21 194 lb 8 oz (88.2 kg)     Health Maintenance Due  Topic Date Due   Hepatitis C Screening  Never done   OPHTHALMOLOGY EXAM  08/06/2021    There are no preventive care reminders to display for this patient.  Lab Results  Component Value Date   TSH 1.40 05/14/2021   Lab Results  Component Value Date   WBC 8.8 05/14/2021   HGB 15.0 05/14/2021   HCT 48 05/14/2021   MCV 81.8 01/01/2021   PLT 175 05/14/2021   Lab Results  Component Value Date   NA 141 05/14/2021   K 3.6 05/14/2021   CO2 33 (A) 05/14/2021   GLUCOSE 140 (H) 01/12/2021  BUN 13 05/14/2021   CREATININE 1.2 05/14/2021   BILITOT 1.1 01/12/2021   ALKPHOS 103 01/12/2021   AST 27 01/12/2021   ALT 27 01/12/2021   PROT 7.9 01/12/2021   ALBUMIN 4.3 01/12/2021   CALCIUM 9.8 05/14/2021   ANIONGAP 7 01/01/2021   GFR 61.16 01/12/2021   Lab Results  Component Value Date   CHOL 109 01/12/2021   Lab Results  Component Value Date   HDL 51.50 01/12/2021   Lab Results  Component Value Date   LDLCALC 41 01/12/2021   Lab Results  Component Value Date   TRIG 79.0 01/12/2021   Lab Results  Component Value Date   CHOLHDL 2 01/12/2021   Lab Results  Component Value Date   HGBA1C 6.7 08/06/2021       Assessment & Plan:   Problem List Items Addressed This Visit       Cardiovascular and Mediastinum   Essential hypertension    Continue taking hydrochlorothiazide, enalapril, and amlodipine.      Stroke [facility])    Currently on baby aspirin and high-dose statin therapy        Endocrine   Type 2 diabetes mellitus without complication ([place])    Pending labs today.  Patient currently maintained with diet and exercise.      Relevant Orders   Hemoglobin A1c   Microalbumin / creatinine urine ratio     Nervous and Auditory   Parkinson's disease ([place])    Diagnosis that patient states he has and has been on medication in the past.  Did refer him to neurology still has not heard back yet.        Genitourinary   BPH (benign prostatic hyperplasia)    Currently maintained on tamsulosin 0.8 mg daily and sildenafil.  Continue medications as prescribed        Other   Insomnia    States that he does take trazodone and this is beneficial.  Patient still having difficulty sleeping I do think this is related to anxiety and depression.  We will try to get this under control prior to further medication or further work-up with sleep and insomnia.      Chronic back pain    Patient is evaluated at [facility].  He has written oxycodone through them.  Was being seen every 3 months but is now stretched to every 6 months.  Continue taking medication as prescribed and follow-up with [facility].      Relevant Medications   DULoxetine (CYMBALTA) 20 MG capsule   HLD (hyperlipidemia)    History of CVA.  Patient is currently on atorvastatin 80 mg.  Pending lab results.  Patient also does really well with exercise continue healthy lifestyle modifications.  Patient also does really well with diet and exercise.  Continue healthy lifestyle modifications.      Low testosterone in male    Currently maintained on testosterone injections continue follow-up as recommended      Anxiety and depression    We  administer PHQ-9 and GAD-7 in office.  Patient did trial sertraline 25 mg.  Was not able to tolerate medication due to increase in tremors.  Patient is having a difficult time since his wife passed away.  We will trial him on duloxetine 20 mg nightly.  Patient does not have a history of liver issues inclusive of hepatitis that was cured LFTs have been within normal limits but will need to titrate cautiously with medication.      Relevant Medications  DULoxetine (CYMBALTA) 20 MG capsule   Lower extremity edema    Patient has bilateral pitting edema.  Right greater than left.  Patient has had BNP in the past that was negative.  We will repeat labs today inclusive of BNP.  This could be related to patient's amlodipine 10 mg.  Continue to monitor pending lab results.      Relevant Orders   Brain natriuretic peptide   Pain, dental    Left lower dental pain.  Upon further exam seems to be more of gum where patient's partial lays will continue the penicillin VK and did give instructions to try to give jaw and gum break when he is at home and not eating.  Patient is established with dentist and is trying to get the money to have tooth extracted.  Follow-up with no improvement.      Preventative health care - Primary    Discussed age-appropriate immunizations and screening exams with patient.  He does get most of his care delivered to the [facility] system.      Relevant Orders   CBC with Differential/Platelet   Comprehensive metabolic panel   Hemoglobin A1c   Lipid panel   TSH   Other Visit Diagnoses     Screening for prostate cancer       Relevant Orders   PSA       Meds ordered this encounter  Medications   DULoxetine (CYMBALTA) 20 MG capsule    Sig: Take 1 capsule (20 mg total) by mouth daily.    Dispense:  30 capsule    Refill:  2    Order Specific Question:   Supervising Provider    Answer:   Loura Pardon A [[id number]]    Follow-up: Return in about 2 months (around 12/09/2021)  for Virtually to see how you are doing on the new medication .  This visit occurred during the SARS-CoV-2 public health emergency.  Safety protocols were in place, including screening questions prior to the visit, additional usage of staff PPE, and extensive cleaning of exam room while observing appropriate contact time as indicated for disinfecting solutions.     Romilda Garret, NP

## 2021-10-09 NOTE — Patient Instructions (Addendum)
Nice to see you today ?I will be in touch with the lab results ?Continue to take the antibiotic and try and leave your bottom plate out more if possible ?Follow up with me in 8 weeks. We can do virtual to see if that medication is helping ? ?

## 2021-10-09 NOTE — Assessment & Plan Note (Signed)
Currently maintained on tamsulosin 0.8 mg daily and sildenafil.  Continue medications as prescribed ?

## 2021-10-09 NOTE — Assessment & Plan Note (Signed)
Discussed age-appropriate immunizations and screening exams with patient.  He does get most of his care delivered to the [facility] system. ?

## 2021-10-09 NOTE — Assessment & Plan Note (Signed)
Currently maintained on testosterone injections continue follow-up as recommended ?

## 2021-10-09 NOTE — Assessment & Plan Note (Signed)
States that he does take trazodone and this is beneficial.  Patient still having difficulty sleeping I do think this is related to anxiety and depression.  We will try to get this under control prior to further medication or further work-up with sleep and insomnia. ?

## 2021-10-09 NOTE — Assessment & Plan Note (Signed)
We administer PHQ-9 and GAD-7 in office.  Patient did trial sertraline 25 mg.  Was not able to tolerate medication due to increase in tremors.  Patient is having a difficult time since his wife passed away.  We will trial him on duloxetine 20 mg nightly.  Patient does not have a history of liver issues inclusive of hepatitis that was cured LFTs have been within normal limits but will need to titrate cautiously with medication. ?

## 2021-10-10 ENCOUNTER — Encounter: Payer: Self-pay | Admitting: Nurse Practitioner

## 2021-10-10 LAB — COMPREHENSIVE METABOLIC PANEL
AG Ratio: 1.3 (calc) (ref 1.0–2.5)
ALT: 16 U/L (ref 9–46)
AST: 20 U/L (ref 10–35)
Albumin: 3.9 g/dL (ref 3.6–5.1)
Alkaline phosphatase (APISO): 62 U/L (ref 35–144)
BUN: 10 mg/dL (ref 7–25)
CO2: 30 mmol/L (ref 20–32)
Calcium: 8.9 mg/dL (ref 8.6–10.3)
Chloride: 100 mmol/L (ref 98–110)
Creat: 1.08 mg/dL (ref 0.70–1.28)
Globulin: 2.9 g/dL (calc) (ref 1.9–3.7)
Glucose, Bld: 108 mg/dL — ABNORMAL HIGH (ref 65–99)
Potassium: 3.6 mmol/L (ref 3.5–5.3)
Sodium: 140 mmol/L (ref 135–146)
Total Bilirubin: 1 mg/dL (ref 0.2–1.2)
Total Protein: 6.8 g/dL (ref 6.1–8.1)

## 2021-10-10 LAB — CBC WITH DIFFERENTIAL/PLATELET
Absolute Monocytes: 828 cells/uL (ref 200–950)
Basophils Absolute: 27 cells/uL (ref 0–200)
Basophils Relative: 0.3 %
Eosinophils Absolute: 72 cells/uL (ref 15–500)
Eosinophils Relative: 0.8 %
HCT: 46.5 % (ref 38.5–50.0)
Hemoglobin: 14.1 g/dL (ref 13.2–17.1)
Lymphs Abs: 1548 cells/uL (ref 850–3900)
MCH: 23.6 pg — ABNORMAL LOW (ref 27.0–33.0)
MCHC: 30.3 g/dL — ABNORMAL LOW (ref 32.0–36.0)
MCV: 77.9 fL — ABNORMAL LOW (ref 80.0–100.0)
MPV: 10.2 fL (ref 7.5–12.5)
Monocytes Relative: 9.2 %
Neutro Abs: 6525 cells/uL (ref 1500–7800)
Neutrophils Relative %: 72.5 %
Platelets: 246 10*3/uL (ref 140–400)
RBC: 5.97 10*6/uL — ABNORMAL HIGH (ref 4.20–5.80)
RDW: 15.5 % — ABNORMAL HIGH (ref 11.0–15.0)
Total Lymphocyte: 17.2 %
WBC: 9 10*3/uL (ref 3.8–10.8)

## 2021-10-10 LAB — LIPID PANEL
Cholesterol: 91 mg/dL (ref <200)
HDL: 52 mg/dL (ref >=40)
LDL Cholesterol (Calc): 26 mg/dL (calc)
Non-HDL Cholesterol (Calc): 39 mg/dL (calc) (ref <130)
Total CHOL/HDL Ratio: 1.8 (calc) (ref <5.0)
Triglycerides: 46 mg/dL (ref <150)

## 2021-10-10 LAB — HEMOGLOBIN A1C
Hgb A1c MFr Bld: 7.1 % of total Hgb — ABNORMAL HIGH (ref <5.7)
Mean Plasma Glucose: 157 mg/dL
eAG (mmol/L): 8.7 mmol/L

## 2021-10-10 LAB — MICROALBUMIN / CREATININE URINE RATIO
Creatinine, Urine: 169 mg/dL (ref 20–320)
Microalb Creat Ratio: 9 mcg/mg creat (ref <30)
Microalb, Ur: 1.6 mg/dL

## 2021-10-10 LAB — BRAIN NATRIURETIC PEPTIDE: Brain Natriuretic Peptide: 27 pg/mL (ref <100)

## 2021-10-10 LAB — TSH: TSH: 1.52 mIU/L (ref 0.40–4.50)

## 2021-10-12 NOTE — Telephone Encounter (Signed)
Provider responded via lab results/mychart-see that note ?

## 2021-10-14 ENCOUNTER — Encounter: Payer: Self-pay | Admitting: Nurse Practitioner

## 2021-10-15 NOTE — Telephone Encounter (Signed)
See mychart messages please about getting a lab appointment set up if he wants ?

## 2021-10-16 ENCOUNTER — Other Ambulatory Visit: Payer: Self-pay | Admitting: Nurse Practitioner

## 2021-10-16 ENCOUNTER — Other Ambulatory Visit: Payer: Self-pay

## 2021-10-16 ENCOUNTER — Other Ambulatory Visit (INDEPENDENT_AMBULATORY_CARE_PROVIDER_SITE_OTHER): Payer: Medicare Other

## 2021-10-16 DIAGNOSIS — E611 Iron deficiency: Secondary | ICD-10-CM | POA: Diagnosis not present

## 2021-10-16 DIAGNOSIS — R7989 Other specified abnormal findings of blood chemistry: Secondary | ICD-10-CM | POA: Diagnosis not present

## 2021-10-16 DIAGNOSIS — R799 Abnormal finding of blood chemistry, unspecified: Secondary | ICD-10-CM | POA: Diagnosis not present

## 2021-10-16 NOTE — Addendum Note (Signed)
Addended by: Ellamae Sia on: 10/16/2021 03:04 PM ? ? Modules accepted: Orders ? ?

## 2021-10-16 NOTE — Progress Notes (Signed)
orders

## 2021-10-16 NOTE — Telephone Encounter (Signed)
Patient is coming today at 3 pm for labs ?

## 2021-10-17 LAB — IRON, TOTAL/TOTAL IRON BINDING CAP
%SAT: 9 % (calc) — ABNORMAL LOW (ref 20–48)
Iron: 33 ug/dL — ABNORMAL LOW (ref 50–180)
TIBC: 356 mcg/dL (calc) (ref 250–425)

## 2021-10-17 LAB — FERRITIN: Ferritin: 6 ng/mL — ABNORMAL LOW (ref 24–380)

## 2021-10-17 LAB — TRANSFERRIN: Transferrin: 283 mg/dL (ref 188–341)

## 2021-10-17 LAB — FOLATE: Folate: >24 ng/mL

## 2021-10-17 LAB — VITAMIN B12: Vitamin B-12: 587 pg/mL (ref 200–1100)

## 2021-10-19 ENCOUNTER — Other Ambulatory Visit: Payer: Self-pay | Admitting: Nurse Practitioner

## 2021-10-19 DIAGNOSIS — E611 Iron deficiency: Secondary | ICD-10-CM

## 2021-10-19 MED ORDER — IRON (FERROUS SULFATE) 325 (65 FE) MG PO TABS: 325.0000 mg | ORAL_TABLET | Freq: Every day | ORAL | 2 refills | Status: DC

## 2021-10-20 ENCOUNTER — Telehealth: Payer: Self-pay | Admitting: Nurse Practitioner

## 2021-10-20 NOTE — Telephone Encounter (Signed)
-----   Message from Victor sent at 10/20/2021  2:54 PM EDT ----- ?Patient has some Ferrous Sulfate 324 mg bottle at home that was not open for when his wife was prescribed to take this. Is it ok to take this and finish it out before getting the RX for 325 mg?  ?

## 2021-10-20 NOTE — Telephone Encounter (Signed)
How much medication does he have at home. I only want him on it for 3 months to start with ?

## 2021-10-20 NOTE — Telephone Encounter (Signed)
That is fine. Just one tablet daily ?

## 2021-10-20 NOTE — Telephone Encounter (Signed)
-----   Message from [place] sent at 10/20/2021  2:56 PM EDT ----- ?Also, Iron RX needs to be re sent to [facility] ?

## 2021-10-21 NOTE — Telephone Encounter (Signed)
Left message to call back  

## 2021-10-22 ENCOUNTER — Other Ambulatory Visit: Payer: Self-pay

## 2021-10-22 MED ORDER — IRON (FERROUS SULFATE) 325 (65 FE) MG PO TABS: 325.0000 mg | ORAL_TABLET | Freq: Every day | ORAL | 2 refills | Status: DC

## 2021-10-22 NOTE — Telephone Encounter (Signed)
I called and spoke with the patient and informed him that the provider stated he could finish his current dose of the ferrous sulphate and to take 1 tablet daily and he understood.  Linnell Swords,cma  ?

## 2021-10-22 NOTE — Telephone Encounter (Signed)
I called [facility] and cancelled the prior prescription for  the iron medication. Yahir Tavano,cma  ?

## 2021-10-22 NOTE — Telephone Encounter (Signed)
I called the patient and informed him that the provider only wants him to take the iron medication for 3 months and he understood. I reordered the medication and sent it to [facility] per patient request.   ?

## 2021-10-23 ENCOUNTER — Other Ambulatory Visit: Payer: Self-pay | Admitting: Nurse Practitioner

## 2021-10-23 DIAGNOSIS — F32A Depression, unspecified: Secondary | ICD-10-CM

## 2021-10-31 ENCOUNTER — Other Ambulatory Visit: Payer: Self-pay | Admitting: Nurse Practitioner

## 2021-10-31 ENCOUNTER — Encounter: Payer: Self-pay | Admitting: Nurse Practitioner

## 2021-10-31 DIAGNOSIS — N400 Enlarged prostate without lower urinary tract symptoms: Secondary | ICD-10-CM

## 2021-10-31 DIAGNOSIS — I10 Essential (primary) hypertension: Secondary | ICD-10-CM

## 2021-11-02 ENCOUNTER — Other Ambulatory Visit: Payer: Self-pay | Admitting: Nurse Practitioner

## 2021-11-02 DIAGNOSIS — E7800 Pure hypercholesterolemia, unspecified: Secondary | ICD-10-CM

## 2021-11-02 MED ORDER — ATORVASTATIN CALCIUM 80 MG PO TABS: 80.0000 mg | ORAL_TABLET | Freq: Every day | ORAL | 1 refills | Status: DC

## 2021-11-02 MED ORDER — AMLODIPINE BESYLATE 10 MG PO TABS: 10.0000 mg | ORAL_TABLET | Freq: Every day | ORAL | 1 refills | Status: DC

## 2021-11-02 MED ORDER — HYDROCHLOROTHIAZIDE 25 MG PO TABS: 25.0000 mg | ORAL_TABLET | Freq: Every day | ORAL | 1 refills | Status: DC

## 2021-11-02 MED ORDER — ENALAPRIL MALEATE 20 MG PO TABS: 20.0000 mg | ORAL_TABLET | Freq: Every day | ORAL | 1 refills | Status: DC

## 2021-11-02 NOTE — Telephone Encounter (Signed)
Can we call [facility] and cancel the sertraline script please ?

## 2021-11-02 NOTE — Telephone Encounter (Signed)
I called [facility] and Sertraline has been D/C off the list for patient already per representative. ?

## 2021-11-11 ENCOUNTER — Ambulatory Visit: Payer: Medicare Other | Admitting: Neurology

## 2021-11-11 ENCOUNTER — Encounter: Payer: Self-pay | Admitting: Neurology

## 2021-11-11 VITALS — BP 126/71 | HR 89 | Ht 67.0 in | Wt 188.0 lb

## 2021-11-11 DIAGNOSIS — R251 Tremor, unspecified: Secondary | ICD-10-CM

## 2021-11-11 NOTE — Progress Notes (Signed)
Subjective:  ?  ?Patient ID: James Pearson is a 76 y.o. male. ? ?HPI ? ? ? ?Star Age, MD, PhD ?[facility] ?[address], [address] ?[id number] ?[place], [place] [address] ? ?Dear Jeneen Rinks, ? ?I saw your patient, James Pearson, upon your kind request in my neurologic clinic today for initial consultation of his tremor and prior diagnosis of Parkinson's disease.  The patient is unaccompanied today.  As you know, James Pearson is a 76 year old right-handed gentleman with an underlying medical history of hepatitis, hypertension, stroke, low testosterone, on testosterone injections, reflux disease, arthritis, and borderline obesity, who reports a longstanding history of tremors particularly in the right upper extremity with a previous diagnosis of Parkinson's disease when he was still residing in [place]. He is currently on no medication for Parkinson's disease.  He reports that his neurologist released him and that they thought he had improved by a miracle.  He reports that prior is helped him.  He reports that he was not referred for Parkinson's disease but for a hand tremor.  He has swelling in both hands, right side more than left and it affects is handwriting.  He has not fallen but is balance is good.  He does not use a walking aid.  Of note, he is on several medications including centrally acting medications, he is on Cymbalta 20 mg daily, he takes oxycodone 10 mg every 4 hours as needed.  In addition, he takes trazodone 50 mg at bedtime nightly for sleep.  He has not been taking Flexeril which is prescribed at 5 mg as needed at bedtime.  He reports that he lost his wife in October 2022.  He reports no family history of Parkinson's disease.  He recalls that his dad had a tremor but has not seen him since he was 47 years old.  His parents separated and his mom never talked about his father.  Patient is 1 of 7 children altogether, he has 1 brother left and 2 sisters left.  He reports that none  of his siblings have had a tremor or has a tremor.   ?Prior neurology records are not available for my review today.  He does not recall the name of the neurologist or the practice he was seen.  I reviewed your office note from 09/18/2021.  He had blood work recently and I was able to review the results: Vitamin B12 on 10/16/2021 was 587, folate above 24, iron studies showed low total iron at 33, iron saturation low at 9%, ferritin low at 6, he has been on iron supplementation.  TSH was normal at 1.52 on 10/09/2021.  He has a regular checkup every 6 months or so at the [place] as well.  He was told that he will need cataract surgery on the left side.  He has noted difficulty driving at night.  He quit smoking in 1992, he does not drink any alcohol, no caffeine daily.  He hydrates well with water, estimates that he drinks about 6 bottles of water per day.  He exercises on a regular basis. ? ?His Past Medical History Is Significant For: ?Past Medical History:  ?Diagnosis Date  ? Arthritis   ? GERD (gastroesophageal reflux disease)   ? Hepatitis   ? Hypertension   ? Parkinson's disease (James Pearson)   ? Positive TB test   ? as a child  ? Stroke [facility]) 08/10/2011  ? ? ?His Past Surgical History Is Significant For: ?Past Surgical History:  ?Procedure Laterality  Date  ? CHOLECYSTECTOMY    ? HAND SURGERY    ? 9 subsequent surgeries  ? rotator cuff surgery    ? ? ?His Family History Is Significant For: ?Family History  ?Problem Relation Age of Onset  ? Heart disease Mother   ? Hypertension Mother   ? Tremor Father   ? COPD Sister   ? Heart disease Sister   ? Hypertension Sister   ? ? ?His Social History Is Significant For: ?Social History  ? ?Socioeconomic History  ? Marital status: Widowed  ?  Spouse name: Not on file  ? Number of children: Not on file  ? Years of education: Not on file  ? Highest education level: Not on file  ?Occupational History  ? Not on file  ?Tobacco Use  ? Smoking status: Former  ?  Types: Cigarettes  ?  Quit date:  08/09/1990  ?  Years since quitting: 31.2  ? Smokeless tobacco: Never  ?Vaping Use  ? Vaping Use: Never used  ?Substance and Sexual Activity  ? Alcohol use: No  ?  Alcohol/week: 0.0 standard drinks  ? Drug use: No  ? Sexual activity: Yes  ?Other Topics Concern  ? Not on file  ?Social History Narrative  ? Caffeine rare.  So lost wife recently back in 05/20/2021 (cancer).  Veteran.  Working : retired.    ? ?Social Determinants of Health  ? ?Financial Resource Strain: Not on file  ?Food Insecurity: Not on file  ?Transportation Needs: Not on file  ?Physical Activity: Not on file  ?Stress: Not on file  ?Social Connections: Not on file  ? ? ?His Allergies Are:  ?Allergies  ?Allergen Reactions  ? Omeprazole Anaphylaxis  ? Gabapentin   ? Ibuprofen Nausea And Vomiting  ?  Other reaction(s): Low blood pressure, NAUSEA,VOMITING, upset stomach  ? Ketorolac Tromethamine   ?  Other reaction(s): Low blood pressure  ? Meloxicam Nausea And Vomiting  ? Morphine Nausea And Vomiting  ? Nsaids Nausea And Vomiting  ? Oxybutynin Chloride   ?  Other reaction(s): Xerostomia, Dry eyes, Dry skin, Xerostomia, Dry eyes, Dry skin, Xerostomia, Dry eyes, Dry skin  ? Propranolol   ?:  ? ?His Current Medications Are:  ?Outpatient Encounter Medications as of 11/11/2021  ?Medication Sig  ? amLODipine (NORVASC) 10 MG tablet Take 1 tablet (10 mg total) by mouth daily.  ? aspirin 81 MG chewable tablet Chew 81 mg by mouth daily.  ? atorvastatin (LIPITOR) 80 MG tablet Take 1 tablet (80 mg total) by mouth daily.  ? CALCIUM-VITAMIN D PO Take by mouth. Calcium 600 mg and vitamin d 400 mg  ? clobetasol (TEMOVATE) 0.05 % external solution Apply 1 application topically 2 (two) times daily. Apply to affected areas on scalp until itchy rash clears/ PRN  ? cyclobenzaprine (FLEXERIL) 5 MG tablet Take 1 tablet (5 mg total) by mouth at bedtime as needed for muscle spasms.  ? enalapril (VASOTEC) 20 MG tablet Take 1 tablet (20 mg total) by mouth daily.  ? Fluocinolone  Acetonide Body 0.01 % OIL Apply to scalp, face and ears QD to BID (Patient taking differently: Apply 1 application. topically daily as needed (irritation).)  ? hydrochlorothiazide (HYDRODIURIL) 25 MG tablet Take 1 tablet (25 mg total) by mouth daily.  ? Iron, Ferrous Sulfate, 325 (65 Fe) MG TABS Take 325 mg by mouth daily.  ? ketoconazole (NIZORAL) 2 % shampoo APPLY THREE TIMES WEEKLY, LET SIT SEVERAL MINUTES BEFORE RINSING. (Patient taking differently: Apply  1 application. topically as needed for irritation.)  ? Oxycodone HCl 10 MG TABS Take by mouth every 4 (four) hours as needed for severe pain. Takes 5 to 10 mg at a time as needed  ? sildenafil (VIAGRA) 100 MG tablet Take 50-100 mg by mouth daily as needed for erectile dysfunction.  ? Specialty Vitamins Products (PROSTATE PO) Take 1 capsule by mouth at bedtime.  ? tamsulosin (FLOMAX) 0.4 MG CAPS capsule TAKE 2 CAPSULES BY MOUTH DAILY  ? testosterone cypionate (DEPOTESTOSTERONE CYPIONATE) 200 MG/ML injection Inject 1 mL into the muscle every 14 (fourteen) days.  ? tobramycin-dexamethasone (TOBRADEX) ophthalmic solution every 4 (four) hours while awake.  ? traZODone (DESYREL) 50 MG tablet Take 1 tablet (50 mg total) by mouth at bedtime as needed. for sleep (Patient taking differently: Take 50 mg by mouth at bedtime.)  ? TURMERIC PO Take 1 capsule by mouth daily.  ? DULoxetine (CYMBALTA) 20 MG capsule TAKE 1 CAPSULE BY MOUTH EVERY DAY (Patient not taking: Reported on 11/11/2021)  ? ?No facility-administered encounter medications on file as of 11/11/2021.  ?: ? ? ?Review of Systems:  ?Out of a complete 14 point review of systems, all are reviewed and negative with the exception of these symptoms as listed below: ? ?Review of Systems  ?Neurological:   ?     Tremors, worse R handed (dominant) more then L hand.  This has been going on for years.  (Noted when lies down at night she feels lumps).    ? ?Objective:  ?Neurological Exam ? ?Physical Exam ?Physical Examination:   ? ?Vitals:  ? 11/11/21 1432  ?BP: 126/71  ?Pulse: 89  ? ? ?General Examination: The patient is a very pleasant 76 y.o. male in no acute distress. He appears well-developed and well-nourished and well groome

## 2021-11-11 NOTE — Patient Instructions (Addendum)
It was nice to meet you today.  You have no obvious tremor today, no obvious signs of Parkinson's-like changes either.  You do have coarse trembling with your handwriting but I would not recommend any medication for this for fear of side effects as you are already taking several medications.  You may be at risk for balance issues and sleepiness.  ?We do not have to make a follow up appointment.  ?Please remember, that any kind of tremor may be exacerbated by anxiety, anger, nervousness, excitement, dehydration, thyroid dysfunction, sleep deprivation, by caffeine, and low blood sugar values or blood sugar fluctuations. Some medications can exacerbate tremors.  You had recent blood work, we do not need to add anything today as far as testing.  ? ?

## 2021-12-05 IMAGING — DX DG LUMBAR SPINE COMPLETE 4+V
5 series · 5 of 5 positions shown · non-contrast
Comparison: None.

CLINICAL DATA: Sciatica.

EXAM:
LUMBAR SPINE - COMPLETE 4+ VIEW

[l-spine ap]
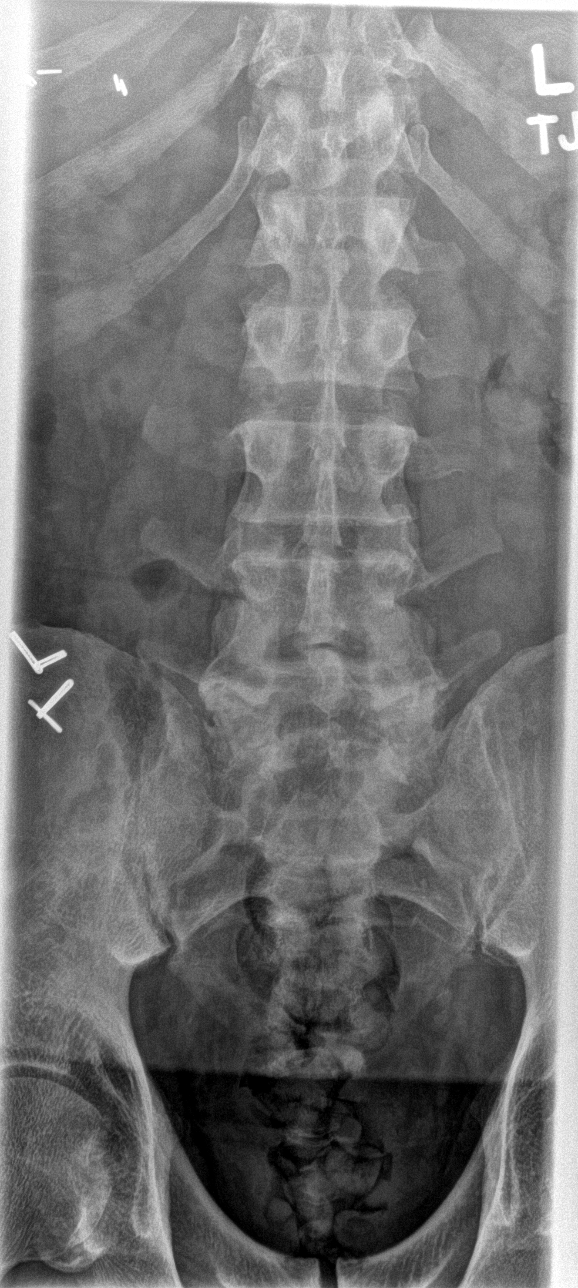

[l-spine obl (1 of 2)]
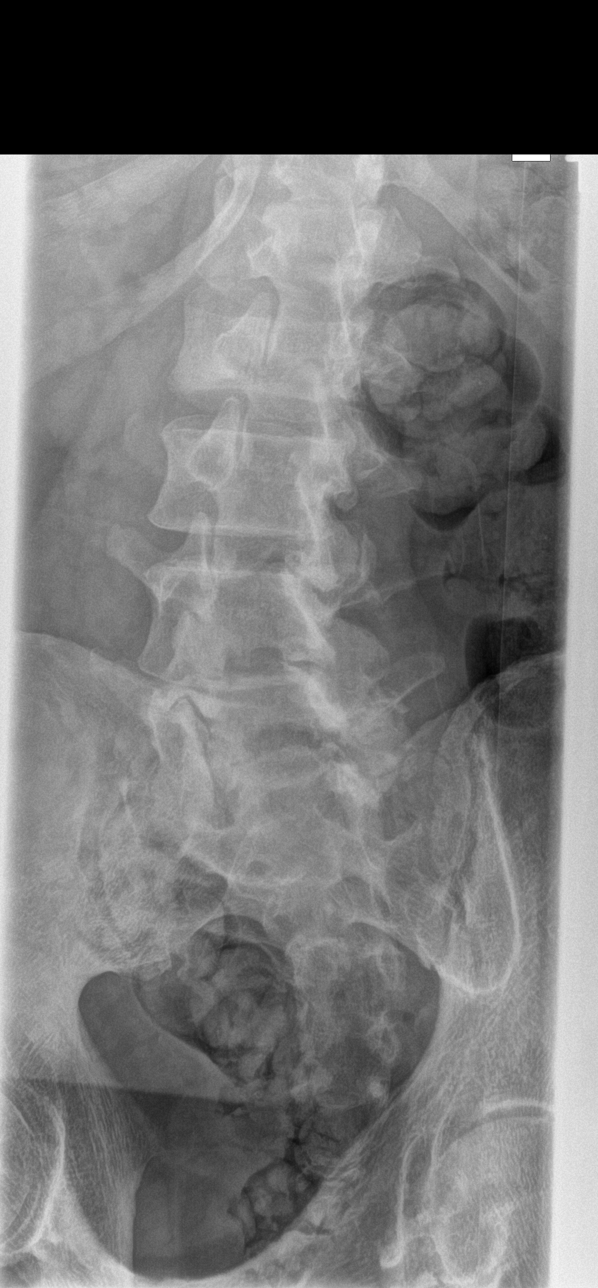

[l-spine obl (2 of 2)]
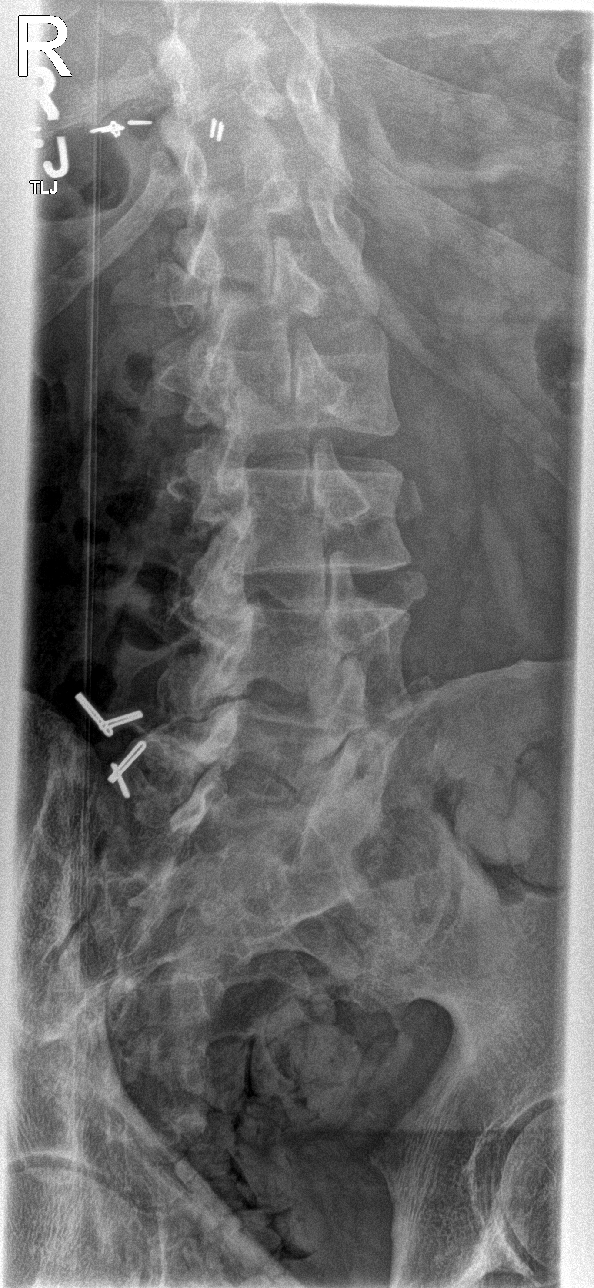

[l-spine lat]
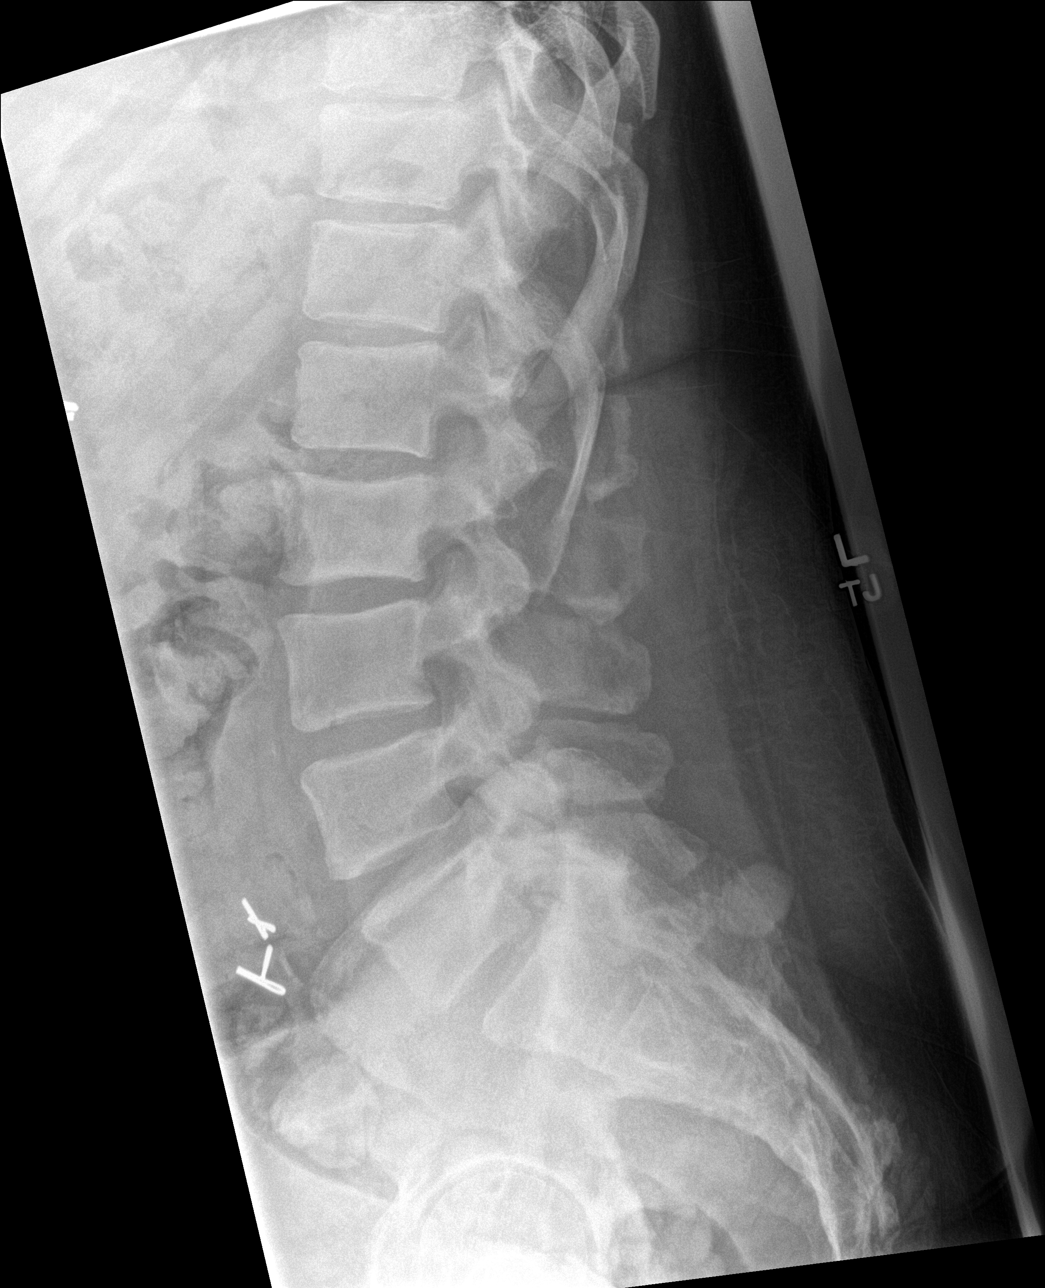

[l-spine l5/s1]
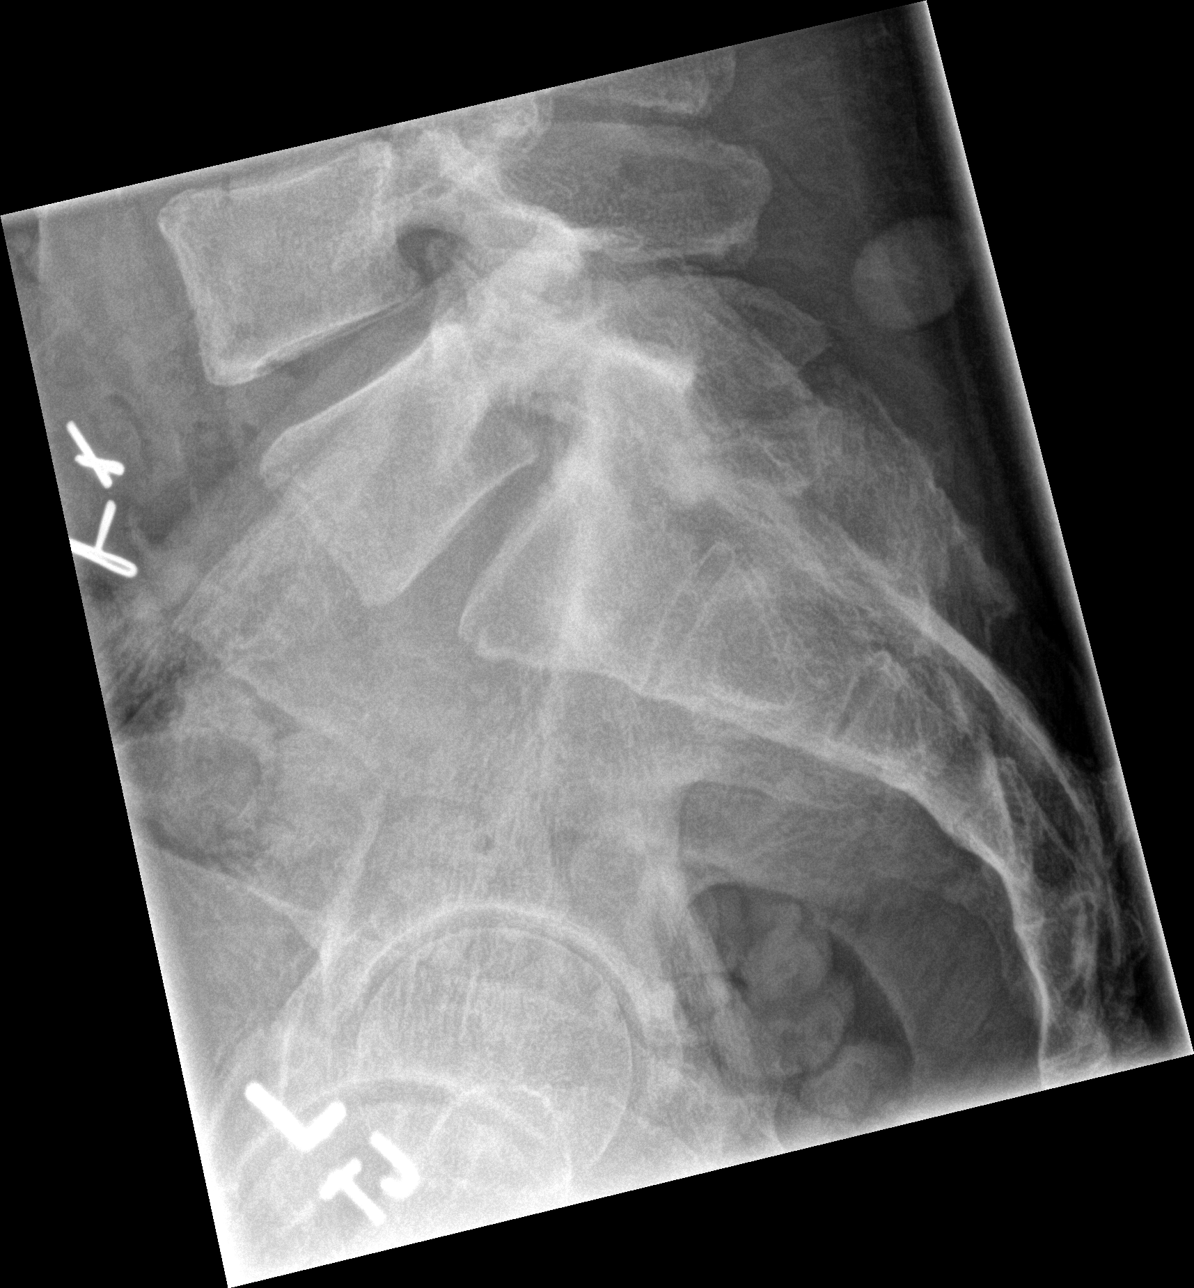

[5 of 5 positions shown; findings below may reference images not displayed]

FINDINGS: No fracture or spondylolisthesis is noted. Disc spaces are
well-maintained. Degenerative changes are seen involving posterior
facet joints of L4-5 and L5-S1.
IMPRESSION: Degenerative changes as described above. No acute abnormality seen
in the lumbar spine.

## 2021-12-14 ENCOUNTER — Ambulatory Visit (INDEPENDENT_AMBULATORY_CARE_PROVIDER_SITE_OTHER): Payer: Medicare Other

## 2021-12-14 VITALS — Ht 67.0 in | Wt 185.0 lb

## 2021-12-14 DIAGNOSIS — Z0000 Encounter for general adult medical examination without abnormal findings: Secondary | ICD-10-CM | POA: Diagnosis not present

## 2021-12-14 NOTE — Patient Instructions (Addendum)
?Mr. James Pearson , ?Thank you for taking time to come for your Medicare Wellness Visit. I appreciate your ongoing commitment to your health goals. Please review the following plan we discussed and let me know if I can assist you in the future.  ? ?These are the goals we discussed: ? Goals   ? ?   Maintain current weight (pt-stated)   ? ?  ?  ?This is a list of the screening recommended for you and due dates:  ?Health Maintenance  ?Topic Date Due  ? Eye exam for diabetics  12/14/2021*  ? Hepatitis C Screening: USPSTF Recommendation to screen - Ages 18-79 yo.  12/15/2022*  ? Flu Shot  03/09/2022  ? Hemoglobin A1C  04/11/2022  ? Complete foot exam   10/10/2022  ? Colon Cancer Screening  06/11/2025  ? Tetanus Vaccine  03/26/2026  ? Pneumonia Vaccine  Completed  ? COVID-19 Vaccine  Completed  ? Zoster (Shingles) Vaccine  Completed  ? HPV Vaccine  Aged Out  ?*Topic was postponed. The date shown is not the original due date.  ? ?Opioid Pain Medicine Management ?Opioids are powerful medicines that are used to treat moderate to severe pain. When used for short periods of time, they can help you to: ?Sleep better. ?Do better in physical or occupational therapy. ?Feel better in the first few days after an injury. ?Recover from surgery. ?Opioids should be taken with the supervision of a trained health care provider. They should be taken for the shortest period of time possible. This is because opioids can be addictive, and the longer you take opioids, the greater your risk of addiction. This addiction can also be called opioid use disorder. ?What are the risks? ?Using opioid pain medicines for longer than 3 days increases your risk of side effects. Side effects include: ?Constipation. ?Nausea and vomiting. ?Breathing difficulties (respiratory depression). ?Drowsiness. ?Confusion. ?Opioid use disorder. ?Itching. ?Taking opioid pain medicine for a long period of time can affect your ability to do daily tasks. It also puts you at  risk for: ?Motor vehicle crashes. ?Depression. ?Suicide. ?Heart attack. ?Overdose, which can be life-threatening. ?What is a pain treatment plan? ?A pain treatment plan is an agreement between you and your health care provider. Pain is unique to each person, and treatments vary depending on your condition. To manage your pain, you and your health care provider need to work together. To help you do this: ?Discuss the goals of your treatment, including how much pain you might expect to have and how you will manage the pain. ?Review the risks and benefits of taking opioid medicines. ?Remember that a good treatment plan uses more than one approach and minimizes the chance of side effects. ?Be honest about the amount of medicines you take and about any drug or alcohol use. ?Get pain medicine prescriptions from only one health care provider. ?Pain can be managed with many types of alternative treatments. Ask your health care provider to refer you to one or more specialists who can help you manage pain through: ?Physical or occupational therapy. ?Counseling (cognitive behavioral therapy). ?Good nutrition. ?Biofeedback. ?Massage. ?Meditation. ?Non-opioid medicine. ?Following a gentle exercise program. ?How to use opioid pain medicine ?Taking medicine ?Take your pain medicine exactly as told by your health care provider. Take it only when you need it. ?If your pain gets less severe, you may take less than your prescribed dose if your health care provider approves. ?If you are not having pain, do nottake pain medicine unless your  health care provider tells you to take it. ?If your pain is severe, do nottry to treat it yourself by taking more pills than instructed on your prescription. Contact your health care provider for help. ?Write down the times when you take your pain medicine. It is easy to become confused while on pain medicine. Writing the time can help you avoid overdose. ?Take other over-the-counter or prescription  medicines only as told by your health care provider. ?Keeping yourself and others safe ? ?While you are taking opioid pain medicine: ?Do not drive, use machinery, or power tools. ?Do not sign legal documents. ?Do not drink alcohol. ?Do not take sleeping pills. ?Do not supervise children by yourself. ?Do not do activities that require climbing or being in high places. ?Do not go to a lake, river, ocean, spa, or swimming pool. ?Do not share your pain medicine with anyone. ?Keep pain medicine in a locked cabinet or in a secure area where pets and children cannot reach it. ?Stopping your use of opioids ?If you have been taking opioid medicine for more than a few weeks, you may need to slowly decrease (taper) how much you take until you stop completely. Tapering your use of opioids can decrease your risk of symptoms of withdrawal, such as: ?Pain and cramping in the abdomen. ?Nausea. ?Sweating. ?Sleepiness. ?Restlessness. ?Uncontrollable shaking (tremors). ?Cravings for the medicine. ?Do not attempt to taper your use of opioids on your own. Talk with your health care provider about how to do this. Your health care provider may prescribe a step-down schedule based on how much medicine you are taking and how long you have been taking it. ?Getting rid of leftover pills ?Do not save any leftover pills. Get rid of leftover pills safely by: ?Taking the medicine to a prescription take-back program. This is usually offered by the county or law enforcement. ?Bringing them to a pharmacy that has a drug disposal container. ?Flushing them down the toilet. Check the label or package insert of your medicine to see whether this is safe to do. ?Throwing them out in the trash. Check the label or package insert of your medicine to see whether this is safe to do. ?If it is safe to throw it out, remove the medicine from the original container, put it into a sealable bag or container, and mix it with used coffee grounds, food scraps, dirt, or  cat litter before putting it in the trash. ?Follow these instructions at home: ?Activity ?Do exercises as told by your health care provider. ?Avoid activities that make your pain worse. ?Return to your normal activities as told by your health care provider. Ask your health care provider what activities are safe for you. ?General instructions ?You may need to take these actions to prevent or treat constipation: ?Drink enough fluid to keep your urine pale yellow. ?Take over-the-counter or prescription medicines. ?Eat foods that are high in fiber, such as beans, whole grains, and fresh fruits and vegetables. ?Limit foods that are high in fat and processed sugars, such as fried or sweet foods. ?Keep all follow-up visits. This is important. ?Where to find support ?If you have been taking opioids for a long time, you may benefit from receiving support for quitting from a local support group or counselor. Ask your health care provider for a referral to these resources in your area. ?Where to find more information ?Centers for Disease Control and Prevention (CDC): http://www.wolf.info/ ?[place] Food and Drug Administration (FDA): [employer] ?Get help right away if: ?You  may have taken too much of an opioid (overdosed). Common symptoms of an overdose: ?Your breathing is slower or more shallow than normal. ?You have a very slow heartbeat (pulse). ?You have slurred speech. ?You have nausea and vomiting. ?Your pupils become very small. ?You have other potential symptoms: ?You are very confused. ?You faint or feel like you will faint. ?You have cold, clammy skin. ?You have blue lips or fingernails. ?You have thoughts of harming yourself or harming others. ?These symptoms may represent a serious problem that is an emergency. Do not wait to see if the symptoms will go away. Get medical help right away. Call your local emergency services (911 in the [place] Do not drive yourself to the hospital.  ?If you ever feel like you may hurt yourself or  others, or have thoughts about taking your own life, get help right away. Go to your nearest emergency department or: ?Call your local emergency services (911 in the [place] ?Call the [facility]

## 2021-12-14 NOTE — Progress Notes (Signed)
? ?Subjective:  ? James Pearson is a 76 y.o. male who presents for Medicare Annual/Subsequent preventive examination. ? ?Review of Systems    ?Virtual Visit via Telephone Note ? ?I connected with  James Pearson on 12/14/21 at  2:00 PM EDT by telephone and verified that I am speaking with the correct person using two identifiers. ? ?Location: ?Patient: Home ?Provider: Office ?Persons participating in the virtual visit: [name] ?  ?I discussed the limitations, risks, security and privacy concerns of performing an evaluation and management service by telephone and the availability of in person appointments. The patient expressed understanding and agreed to proceed. ? ?Interactive audio and video telecommunications were attempted between this nurse and patient, however failed, due to patient having technical difficulties OR patient did not have access to video capability.  We continued and completed visit with audio only. ? ?Some vital signs may be absent or patient reported.  ? ?James Peaches, LPN  ?Cardiac Risk Factors include: advanced age (>63mn, >>62women);diabetes mellitus;hypertension;male gender ? ?   ?Objective:  ?  ?Today's Vitals  ? 12/14/21 1351  ?Weight: 185 lb (83.9 kg)  ?Height: '5\' 7"'$  (1.702 m)  ? ?Body mass index is 28.98 kg/m?. ? ? ?  12/14/2021  ?  2:03 PM 08/12/2014  ?  3:41 AM  ?Advanced Directives  ?Does Patient Have a Medical Advance Directive? No No  ?Would patient like information on creating a medical advance directive? No - Patient declined No - patient declined information  ? ? ?Current Medications (verified) ?Outpatient Encounter Medications as of 12/14/2021  ?Medication Sig  ? amLODipine (NORVASC) 10 MG tablet Take 1 tablet (10 mg total) by mouth daily.  ? aspirin 81 MG chewable tablet Chew 81 mg by mouth daily.  ? atorvastatin (LIPITOR) 80 MG tablet Take 1 tablet (80 mg total) by mouth daily.  ? CALCIUM-VITAMIN D PO Take by mouth. Calcium 600 mg and vitamin d 400 mg  ?  clobetasol (TEMOVATE) 0.05 % external solution Apply 1 application topically 2 (two) times daily. Apply to affected areas on scalp until itchy rash clears/ PRN  ? cyclobenzaprine (FLEXERIL) 5 MG tablet Take 1 tablet (5 mg total) by mouth at bedtime as needed for muscle spasms.  ? DULoxetine (CYMBALTA) 20 MG capsule TAKE 1 CAPSULE BY MOUTH EVERY DAY (Patient not taking: Reported on 11/11/2021)  ? enalapril (VASOTEC) 20 MG tablet Take 1 tablet (20 mg total) by mouth daily.  ? Fluocinolone Acetonide Body 0.01 % OIL Apply to scalp, face and ears QD to BID (Patient taking differently: Apply 1 application. topically daily as needed (irritation).)  ? hydrochlorothiazide (HYDRODIURIL) 25 MG tablet Take 1 tablet (25 mg total) by mouth daily.  ? Iron, Ferrous Sulfate, 325 (65 Fe) MG TABS Take 325 mg by mouth daily.  ? ketoconazole (NIZORAL) 2 % shampoo APPLY THREE TIMES WEEKLY, LET SIT SEVERAL MINUTES BEFORE RINSING. (Patient taking differently: Apply 1 application. topically as needed for irritation.)  ? Oxycodone HCl 10 MG TABS Take by mouth every 4 (four) hours as needed for severe pain. Takes 5 to 10 mg at a time as needed  ? sildenafil (VIAGRA) 100 MG tablet Take 50-100 mg by mouth daily as needed for erectile dysfunction.  ? Specialty Vitamins Products (PROSTATE PO) Take 1 capsule by mouth at bedtime.  ? tamsulosin (FLOMAX) 0.4 MG CAPS capsule TAKE 2 CAPSULES BY MOUTH DAILY  ? testosterone cypionate (DEPOTESTOSTERONE CYPIONATE) 200 MG/ML injection Inject 1 mL into the muscle every  14 (fourteen) days.  ? tobramycin-dexamethasone (TOBRADEX) ophthalmic solution every 4 (four) hours while awake.  ? traZODone (DESYREL) 50 MG tablet Take 1 tablet (50 mg total) by mouth at bedtime as needed. for sleep (Patient taking differently: Take 50 mg by mouth at bedtime.)  ? TURMERIC PO Take 1 capsule by mouth daily.  ? ?No facility-administered encounter medications on file as of 12/14/2021.  ? ? ?Allergies (verified) ?Omeprazole,  Gabapentin, Ibuprofen, Ketorolac tromethamine, Meloxicam, Morphine, Nsaids, Oxybutynin chloride, and Propranolol  ? ?History: ?Past Medical History:  ?Diagnosis Date  ? Arthritis   ? GERD (gastroesophageal reflux disease)   ? Hepatitis   ? Hypertension   ? Parkinson's disease ([place])   ? Positive TB test   ? as a child  ? Stroke ([place]) 08/10/2011  ? ?Past Surgical History:  ?Procedure Laterality Date  ? CHOLECYSTECTOMY    ? HAND SURGERY    ? 9 subsequent surgeries  ? rotator cuff surgery    ? ?Family History  ?Problem Relation Age of Onset  ? Heart disease Mother   ? Hypertension Mother   ? Tremor Father   ? COPD Sister   ? Heart disease Sister   ? Hypertension Sister   ? ?Social History  ? ?Socioeconomic History  ? Marital status: Widowed  ?  Spouse name: Not on file  ? Number of children: Not on file  ? Years of education: Not on file  ? Highest education level: Not on file  ?Occupational History  ? Not on file  ?Tobacco Use  ? Smoking status: Former  ?  Types: Cigarettes  ?  Quit date: 08/09/1990  ?  Years since quitting: 31.3  ? Smokeless tobacco: Never  ?Vaping Use  ? Vaping Use: Never used  ?Substance and Sexual Activity  ? Alcohol use: No  ?  Alcohol/week: 0.0 standard drinks  ? Drug use: No  ? Sexual activity: Yes  ?Other Topics Concern  ? Not on file  ?Social History Narrative  ? Caffeine rare.  So lost wife recently back in 05/20/2021 (cancer).  Veteran.  Working : retired.    ? ?Social Determinants of Health  ? ?Financial Resource Strain: Low Risk   ? Difficulty of Paying Living Expenses: Not hard at all  ?Food Insecurity: No Food Insecurity  ? Worried About Charity fundraiser in the Last Year: Never true  ? Ran Out of Food in the Last Year: Never true  ?Transportation Needs: No Transportation Needs  ? Lack of Transportation (Medical): No  ? Lack of Transportation (Non-Medical): No  ?Physical Activity: Sufficiently Active  ? Days of Exercise per Week: 5 days  ? Minutes of Exercise per Session: 150+ min   ?Stress: No Stress Concern Present  ? Feeling of Stress : Not at all  ?Social Connections: Moderately Integrated  ? Frequency of Communication with Friends and Family: More than three times a week  ? Frequency of Social Gatherings with Friends and Family: More than three times a week  ? Attends Religious Services: More than 4 times per year  ? Active Member of Clubs or Organizations: Yes  ? Attends Archivist Meetings: More than 4 times per year  ? Marital Status: Widowed  ? ? ? ?Clinical Intake: ? ?Pre-visit preparation completed: NoNutrition Risk Assessment: ? ?Has the patient had any N/V/D within the last 2 months?  No  ?Does the patient have any non-healing wounds?  No  ?Has the patient had any unintentional weight loss or  weight gain?  No  ? ?Diabetes: ? ?Is the patient diabetic?  Yes  ?If diabetic, was a CBG obtained today?  No  ?Did the patient bring in their glucometer from home?  No  ?How often do you monitor your CBG's? PRN.  ? ?Financial Strains and Diabetes Management: ? ?Are you having any financial strains with the device, your supplies or your medication? No .  ?Does the patient want to be seen by Chronic Care Management for management of their diabetes?  No  ?Would the patient like to be referred to a Nutritionist or for Diabetic Management?  No  ? ?Diabetic Exams: ? ?Diabetic Eye Exam: Completed Yes. Overdue for diabetic eye exam. Pt has been advised about the importance in completing this exam. A referral has been placed today. Message sent to referral coordinator for scheduling purposes. Advised pt to expect a call from office referred to regarding appt. ? ?Diabetic Foot Exam: Completed Yes. Pt has been advised about the importance in completing this exam. Pt is scheduled for diabetic foot exam on Followed by PCP.   ? ?Pain : No/denies painDiabetic?  Yes ? ?Activities of Daily Living ? ?  12/14/2021  ?  2:01 PM  ?In your present state of health, do you have any difficulty performing the  following activities:  ?Hearing? 0  ?Vision? 0  ?Difficulty concentrating or making decisions? 0  ?Walking or climbing stairs? 0  ?Dressing or bathing? 0  ?Doing errands, shopping? 0  ?Preparing Food and eating ? N

## 2021-12-22 ENCOUNTER — Encounter: Payer: Self-pay | Admitting: Orthopaedic Surgery

## 2021-12-22 ENCOUNTER — Encounter: Payer: Self-pay | Admitting: Physician Assistant

## 2021-12-22 ENCOUNTER — Ambulatory Visit: Payer: Medicare Other | Admitting: Physician Assistant

## 2021-12-22 DIAGNOSIS — M12812 Other specific arthropathies, not elsewhere classified, left shoulder: Secondary | ICD-10-CM | POA: Diagnosis not present

## 2021-12-22 DIAGNOSIS — M75102 Unspecified rotator cuff tear or rupture of left shoulder, not specified as traumatic: Secondary | ICD-10-CM

## 2021-12-22 DIAGNOSIS — M1711 Unilateral primary osteoarthritis, right knee: Secondary | ICD-10-CM | POA: Diagnosis not present

## 2021-12-22 DIAGNOSIS — M25552 Pain in left hip: Secondary | ICD-10-CM

## 2021-12-22 MED ORDER — BUPIVACAINE HCL 0.25 % IJ SOLN
2.0000 mL | INTRAMUSCULAR | Status: AC | PRN
  Administered 2021-12-22: 2 mL via INTRA_ARTICULAR

## 2021-12-22 MED ORDER — LIDOCAINE HCL 1 % IJ SOLN
2.0000 mL | INTRAMUSCULAR | Status: AC | PRN
  Administered 2021-12-22: 2 mL

## 2021-12-22 MED ORDER — METHYLPREDNISOLONE ACETATE 40 MG/ML IJ SUSP
80.0000 mg | INTRAMUSCULAR | Status: AC | PRN
  Administered 2021-12-22: 80 mg via INTRA_ARTICULAR

## 2021-12-22 NOTE — Progress Notes (Signed)
? ?Office Visit Note ?  ?Patient: James Pearson           ?Date of Birth: Feb 21, 1946           ?MRN: [id number] ?Visit Date: 12/22/2021 ?             ?Requested by: Michela Pitcher, NP ?[address] ?[place],  [place] [id number] ?PCP: Michela Pitcher, NP ? ?Chief Complaint  ?Patient presents with  ?? Right Knee - Follow-up  ?? Left Shoulder - Follow-up  ? ? ? ? ?HPI: ?The patient is a pleasant 76 year old gentleman with a history of right knee arthritis and left shoulder arthritis.  He periodically comes in for steroid injections.  Last injection was in January.  He knows he probably needs to have shoulder surgery but unfortunately he does not have any family or friends that can care for him ? ?Assessment & Plan: ?Visit Diagnoses: No diagnosis found. ? ?Plan: We will go forward with the injections today.  Would like for him to follow-up with Dr. Durward Fortes in a few weeks as he has not seen him in a while.  We will also reevaluate small masses posterior shoulder ? ?Follow-Up Instructions: No follow-ups on file.  ? ?Ortho Exam ? ?Patient is alert, oriented, no adenopathy, well-dressed, normal affect, normal respiratory effort. ?Left shoulder: He has excellent strength he can forward flex to about 160 degrees but is quite painful for him he has limited motion.  There is no redness there is no erythema he does have a small circular dime sized mass in the skin that coordinates to where his previous injection was done but there is no erythema no fluctuance ?Right knee crepitus with range of motion tender medial lateral joint line no effusion no redness ? ?Imaging: ?No results found. ?No images are attached to the encounter. ? ?Labs: ?Lab Results  ?Component Value Date  ? HGBA1C 7.1 (H) 10/09/2021  ? HGBA1C 6.7 08/06/2021  ? HGBA1C 7.3 (H) 01/12/2021  ? ? ? ?Lab Results  ?Component Value Date  ? ALBUMIN 4.3 01/12/2021  ? ALBUMIN 4.2 01/22/2019  ? ? ?No results found for: MG ?Lab Results  ?Component Value Date  ? VD25OH 48.4  05/14/2021  ? ? ?No results found for: PREALBUMIN ? ?  Latest Ref Rng & Units 10/09/2021  ?  3:27 PM 05/14/2021  ? 12:00 AM 01/01/2021  ?  6:46 PM  ?CBC EXTENDED  ?WBC 3.8 - 10.8 Thousand/uL 9.0   8.8   8.9    ?RBC 4.20 - 5.80 Million/uL 5.97   3.8   5.72    ?Hemoglobin 13.2 - 17.1 g/dL 14.1   15.0   15.0    ?HCT 38.5 - 50.0 % 46.5   48   46.8    ?Platelets 140 - 400 Thousand/uL 246   175   269    ?NEUT# 1,500 - 7,800 cells/uL 6,525      ?Lymph# 850 - 3,900 cells/uL 1,548      ? ? ? ?There is no height or weight on file to calculate BMI. ? ?Orders:  ?No orders of the defined types were placed in this encounter. ? ?No orders of the defined types were placed in this encounter. ? ? ? Procedures: ?Large Joint Inj on 12/22/2021 10:58 AM ?Indications: pain and diagnostic evaluation ?Details: 25 G 1.5 in needle, anteromedial approach ? ?Arthrogram: No ? ?Medications: 80 mg methylPREDNISolone acetate 40 MG/ML; 2 mL lidocaine 1 %; 2 mL bupivacaine 0.25 % ?  Outcome: tolerated well, no immediate complications ?Procedure, treatment alternatives, risks and benefits explained, specific risks discussed. Consent was given by the patient.  ? ? ?Large Joint Inj: L subacromial bursa on 12/22/2021 10:59 AM ?Indications: diagnostic evaluation and pain ?Details: 25 G 1.5 in needle, posterior approach ? ?Arthrogram: No ? ?Medications: 2 mL lidocaine 1 %; 80 mg methylPREDNISolone acetate 40 MG/ML; 2 mL bupivacaine 0.25 % ?Outcome: tolerated well, no immediate complications ?Procedure, treatment alternatives, risks and benefits explained, specific risks discussed. Consent was given by the patient.  ? ? ? ?Clinical Data: ?No additional findings. ? ?ROS: ? ?All other systems negative, except as noted in the HPI. ?Review of Systems ? ?Objective: ?Vital Signs: There were no vitals taken for this visit. ? ?Specialty Comments:  ?No specialty comments available. ? ?PMFS History: ?Patient Active Problem List  ? Diagnosis Date Noted  ?? Lower extremity  edema 10/09/2021  ?? Pain, dental 10/09/2021  ?? Preventative health care 10/09/2021  ?? Tremor 09/18/2021  ?? Left hip pain 09/18/2021  ?? Anxiety and depression 08/26/2021  ?? Chronic pain of right knee 08/26/2021  ?? Grief 08/26/2021  ?? Rotator cuff tear arthropathy 06/23/2021  ?? Psychosexual dysfunction with inhibited sexual excitement 06/23/2021  ?? Obesity 06/23/2021  ?? Benign neoplasm of colon 06/23/2021  ?? Carpal tunnel syndrome 06/23/2021  ?? History of adenomatous polyp of colon 06/23/2021  ?? Brachial neuritis 06/23/2021  ?? Parkinson's disease ([place]) 06/23/2021  ?? Dysuria 03/18/2021  ?? Low testosterone in male 03/18/2021  ?? Hypokalemia 01/06/2021  ?? Pain in left shoulder 11/05/2020  ?? Bilateral primary osteoarthritis of knee 12/19/2019  ?? Unilateral primary osteoarthritis, right knee 10/30/2019  ?? Insomnia 05/06/2015  ?? Essential hypertension 05/06/2015  ?? Chronic back pain 05/06/2015  ?? Type 2 diabetes mellitus without complication ([place]) 38/18/2993  ?? Stroke ([place]) 05/06/2015  ?? HLD (hyperlipidemia) 05/06/2015  ?? BPH (benign prostatic hyperplasia) 05/06/2015  ?? Esophageal reflux 05/06/2015  ?? History of TB (tuberculosis) 05/06/2015  ?? Iron deficiency 04/10/2015  ? ?Past Medical History:  ?Diagnosis Date  ?? Arthritis   ?? GERD (gastroesophageal reflux disease)   ?? Hepatitis   ?? Hypertension   ?? Parkinson's disease ([place])   ?? Positive TB test   ? as a child  ?? Stroke ([place]) 08/10/2011  ?  ?Family History  ?Problem Relation Age of Onset  ?? Heart disease Mother   ?? Hypertension Mother   ?? Tremor Father   ?? COPD Sister   ?? Heart disease Sister   ?? Hypertension Sister   ?  ?Past Surgical History:  ?Procedure Laterality Date  ?? CHOLECYSTECTOMY    ?? HAND SURGERY    ? 9 subsequent surgeries  ?? rotator cuff surgery    ? ?Social History  ? ?Occupational History  ?? Not on file  ?Tobacco Use  ?? Smoking status: Former  ?  Types: Cigarettes  ?  Quit date: 08/09/1990  ?  Years since  quitting: 31.3  ?? Smokeless tobacco: Never  ?Vaping Use  ?? Vaping Use: Never used  ?Substance and Sexual Activity  ?? Alcohol use: No  ?  Alcohol/week: 0.0 standard drinks  ?? Drug use: No  ?? Sexual activity: Yes  ? ? ? ? ? ?

## 2021-12-23 ENCOUNTER — Other Ambulatory Visit: Payer: Self-pay | Admitting: Physician Assistant

## 2021-12-25 ENCOUNTER — Telehealth: Payer: Self-pay | Admitting: Physician Assistant

## 2021-12-25 NOTE — Telephone Encounter (Signed)
Patient would like to know how often can he get cortisone injections he got one on the 16th of this month but he is hurting very badly today

## 2021-12-27 ENCOUNTER — Other Ambulatory Visit: Payer: Self-pay | Admitting: Nurse Practitioner

## 2021-12-27 DIAGNOSIS — E7800 Pure hypercholesterolemia, unspecified: Secondary | ICD-10-CM

## 2021-12-30 ENCOUNTER — Other Ambulatory Visit: Payer: Medicare Other

## 2022-01-06 ENCOUNTER — Ambulatory Visit
Admission: RE | Admit: 2022-01-06 | Discharge: 2022-01-06 | Disposition: A | Discharge status: Home / Self Care | Payer: Medicare Other | Source: Ambulatory Visit | Attending: Physician Assistant | Admitting: Physician Assistant

## 2022-01-06 DIAGNOSIS — G8929 Other chronic pain: Secondary | ICD-10-CM | POA: Diagnosis not present

## 2022-01-06 DIAGNOSIS — M25552 Pain in left hip: Secondary | ICD-10-CM | POA: Diagnosis not present

## 2022-01-06 MED ORDER — METHYLPREDNISOLONE ACETATE 40 MG/ML INJ SUSP (RADIOLOG
80.0000 mg | Freq: Once | INTRAMUSCULAR | Status: AC
  Administered 2022-01-06: 80 mg via INTRA_ARTICULAR

## 2022-01-06 MED ORDER — IOPAMIDOL (ISOVUE-M 200) INJECTION 41%
1.0000 mL | Freq: Once | INTRAMUSCULAR | Status: AC
  Administered 2022-01-06: 1 mL via INTRA_ARTICULAR

## 2022-01-13 ENCOUNTER — Other Ambulatory Visit: Payer: Self-pay | Admitting: Nurse Practitioner

## 2022-01-13 DIAGNOSIS — I10 Essential (primary) hypertension: Secondary | ICD-10-CM

## 2022-01-16 ENCOUNTER — Other Ambulatory Visit: Payer: Self-pay | Admitting: Nurse Practitioner

## 2022-01-16 DIAGNOSIS — I10 Essential (primary) hypertension: Secondary | ICD-10-CM

## 2022-01-24 ENCOUNTER — Other Ambulatory Visit: Payer: Self-pay | Admitting: Nurse Practitioner

## 2022-01-24 DIAGNOSIS — F32A Depression, unspecified: Secondary | ICD-10-CM

## 2022-01-24 DIAGNOSIS — F419 Anxiety disorder, unspecified: Secondary | ICD-10-CM

## 2022-01-25 NOTE — Telephone Encounter (Signed)
Left message to call back  

## 2022-01-26 ENCOUNTER — Telehealth: Payer: Self-pay

## 2022-01-26 NOTE — Telephone Encounter (Signed)
Spoke with patient and appointment made

## 2022-01-26 NOTE — Telephone Encounter (Signed)
Patient states he is loosing weight, at home since his LOV in March he has weighed as low as 175lb and this morning he was 180.5 lb ON 10/09/21 at the office patient weighed 188.7 lb. Patient is walking a lot and is eating more and better some but most of his meals are frozen meals. Patient wonders if he should be worried about the weight loss at this time, need to come in?

## 2022-01-26 NOTE — Telephone Encounter (Signed)
Patient was returning a call back from yesterday. Pt does not know the reason for the call. Please advise thank you.  Callback Number: [phone]

## 2022-01-26 NOTE — Telephone Encounter (Signed)
Spoke with patient. Patient states he took about 2 capsules, and they made him feel dizzy. Patient states he is doing better emotionally.

## 2022-01-26 NOTE — Telephone Encounter (Signed)
If the patient is concerned he can always schedule an office visit. If he is walking a lot and eating healthier it maybe natural weight loss.

## 2022-01-28 ENCOUNTER — Ambulatory Visit (INDEPENDENT_AMBULATORY_CARE_PROVIDER_SITE_OTHER): Payer: Medicare Other | Admitting: Nurse Practitioner

## 2022-01-28 ENCOUNTER — Ambulatory Visit (INDEPENDENT_AMBULATORY_CARE_PROVIDER_SITE_OTHER)
Admission: RE | Admit: 2022-01-28 | Discharge: 2022-01-28 | Disposition: A | Discharge status: Home / Self Care | Payer: Medicare Other | Source: Ambulatory Visit | Attending: Nurse Practitioner | Admitting: Nurse Practitioner

## 2022-01-28 VITALS — BP 124/70 | HR 84 | Temp 98.1°F | Resp 14 | Ht 67.0 in | Wt 181.1 lb

## 2022-01-28 DIAGNOSIS — R634 Abnormal weight loss: Secondary | ICD-10-CM | POA: Diagnosis not present

## 2022-01-28 DIAGNOSIS — Z87891 Personal history of nicotine dependence: Secondary | ICD-10-CM

## 2022-01-28 DIAGNOSIS — E611 Iron deficiency: Secondary | ICD-10-CM

## 2022-01-28 DIAGNOSIS — E119 Type 2 diabetes mellitus without complications: Secondary | ICD-10-CM

## 2022-01-28 LAB — COMPREHENSIVE METABOLIC PANEL
ALT: 21 U/L (ref 0–53)
AST: 23 U/L (ref 0–37)
Albumin: 3.9 g/dL (ref 3.5–5.2)
Alkaline Phosphatase: 81 U/L (ref 39–117)
BUN: 8 mg/dL (ref 6–23)
CO2: 35 mEq/L — ABNORMAL HIGH (ref 19–32)
Calcium: 8.9 mg/dL (ref 8.4–10.5)
Chloride: 100 mEq/L (ref 96–112)
Creatinine, Ser: 1.17 mg/dL (ref 0.40–1.50)
GFR: 61.34 mL/min (ref >60.00)
Glucose, Bld: 115 mg/dL — ABNORMAL HIGH (ref 70–99)
Potassium: 3.6 mEq/L (ref 3.5–5.1)
Sodium: 139 mEq/L (ref 135–145)
Total Bilirubin: 1 mg/dL (ref 0.2–1.2)
Total Protein: 6.8 g/dL (ref 6.0–8.3)

## 2022-01-28 LAB — IBC + FERRITIN
Ferritin: 28.6 ng/mL (ref 22.0–322.0)
Iron: 305 ug/dL — ABNORMAL HIGH (ref 42–165)
Saturation Ratios: 94.3 % — ABNORMAL HIGH (ref 20.0–50.0)
TIBC: 323.4 ug/dL (ref 250.0–450.0)
Transferrin: 231 mg/dL (ref 212.0–360.0)

## 2022-01-28 LAB — CBC
HCT: 53.3 % — ABNORMAL HIGH (ref 39.0–52.0)
Hemoglobin: 17.1 g/dL — ABNORMAL HIGH (ref 13.0–17.0)
MCHC: 32.1 g/dL (ref 30.0–36.0)
MCV: 83 fl (ref 78.0–100.0)
Platelets: 167 10*3/uL (ref 150.0–400.0)
RBC: 6.42 Mil/uL — ABNORMAL HIGH (ref 4.22–5.81)
RDW: 23.2 % — ABNORMAL HIGH (ref 11.5–15.5)
WBC: 7.3 10*3/uL (ref 4.0–10.5)

## 2022-01-28 LAB — TSH: TSH: 0.99 u[IU]/mL (ref 0.35–5.50)

## 2022-01-28 LAB — HEMOGLOBIN A1C: Hgb A1c MFr Bld: 6.9 % — ABNORMAL HIGH (ref 4.6–6.5)

## 2022-01-28 NOTE — Assessment & Plan Note (Addendum)
Generally controlled with diet.  Last A1c with me was 6.7 off of medication.  Patient does check his blood glucose on occasion the highest is gotten is 155 average is around 119.  Given the weight loss will make sure A1c has not skyrocketed.  Pending lab result

## 2022-01-28 NOTE — Progress Notes (Signed)
Established Patient Office Visit  Subjective   Patient ID: James Pearson, male    DOB: June 15, 1946  Age: 76 y.o. MRN: [id number]  Chief Complaint  Patient presents with   Weight Loss    Was 180lb 2oz at home this morning. He is trying to eat more but is also working out. No nausea or diarrhea.    HPI  Weight loss: States that he noticed approx 2 weeks ago. States that his target is 185 lowest he has gotten is 177. He has been doing at least 6,0000 steps a day  up to 8,000 day and at least 400 activity calories a day 5 times a week.  States that he will eat frozen dinners, sometimes cooked dinner like salmon, broccoli and mashed potatoes. States he will have some kind of breakfast in the morning. Will have a light lunch like soup or hotdog  States that he has been having 2 meals a day sometimes 3.   States he started on ensure that he will drink intermittently. States that he does not feel poor just noticed the weight loss  States that he will have a slight headache that can he can get to go away after he eats  Colonoscopy 10/2021. States that they found 6 polyps/ States that he has a 3 year recall.      Review of Systems  Constitutional:  Positive for weight loss. Negative for chills, fever and malaise/fatigue.  Gastrointestinal:  Negative for blood in stool, diarrhea, nausea and vomiting.       BM daily  Genitourinary:  Negative for hematuria.  Neurological:  Positive for headaches.      Objective:     BP 124/70   Pulse 84   Temp 98.1 F (36.7 C)   Resp 14   Ht '5\' 7"'$  (1.702 m)   Wt 181 lb 1 oz (82.1 kg)   SpO2 97%   BMI 28.36 kg/m  BP Readings from Last 3 Encounters:  01/28/22 124/70  11/11/21 126/71  10/09/21 110/60   Wt Readings from Last 3 Encounters:  01/28/22 181 lb 1 oz (82.1 kg)  12/14/21 185 lb (83.9 kg)  11/11/21 188 lb (85.3 kg)  10/09/2021 188.7  09/18/2021 197.9 08/26/2021 194.8 03/18/2021 192.8 Physical Exam Vitals and nursing note  reviewed.  Constitutional:      Appearance: Normal appearance.  Neck:     Thyroid: No thyroid mass, thyromegaly or thyroid tenderness.  Cardiovascular:     Rate and Rhythm: Normal rate and regular rhythm.     Heart sounds: Normal heart sounds.  Pulmonary:     Effort: Pulmonary effort is normal.     Breath sounds: Normal breath sounds.  Abdominal:     General: Bowel sounds are normal. There is no distension.     Palpations: There is no mass.     Tenderness: There is no abdominal tenderness.     Hernia: No hernia is present.  Lymphadenopathy:     Cervical: No cervical adenopathy.  Neurological:     Mental Status: He is alert.      No results found for any visits on 01/28/22.    The ASCVD Risk score (Arnett DK, et al., 2019) failed to calculate for the following reasons:   The patient has a prior MI or stroke diagnosis    Assessment & Plan:   Problem List Items Addressed This Visit       Endocrine   Type 2 diabetes mellitus without complication ([place])  Generally controlled with diet.  Last A1c with me was 6.7 off of medication.  Patient does check his blood glucose on occasion the highest is gotten is 155 average is around 119.  Given the weight loss will make sure A1c has not skyrocketed.  Pending lab result      Relevant Orders   Hemoglobin A1c     Other   Iron deficiency   Relevant Orders   CBC   IBC + Ferritin   Former smoker    Being a former smoker and unexplained weight loss we will obtain a chest x-ray.  Patient's not having any symptoms currently      Relevant Orders   DG Chest 2 View   Unexplained weight loss - Primary    Also.  Did review chart and go through the last 7 weights with a mean average loss of approximate 13 pounds.  Patient is fairly active going to the [facility] approximately 5 days a week he did show me his activity counter on his phone.  Patient also eating 2 meals a day with an occasional Ensure.  Patient does not feel poor.  Last  colonoscopy was 10/26/2021 with polyps that were removed per patient report this was done to the [place] patient does have a remote history of smoking.  We will check basic labs and chest x-ray as patient former smoker and history of TB in the past      Relevant Orders   CBC   Comprehensive metabolic panel   TSH   DG Chest 2 View    Return in about 6 months (around 07/30/2022) for recheck.    Romilda Garret, NP

## 2022-01-28 NOTE — Patient Instructions (Signed)
Nice to see you today I will be in touch with the lab results once I have them If you can please drop off the last colonoscopy report to the front desk Follow up with me in 6 months, sooner if you need me

## 2022-01-28 NOTE — Assessment & Plan Note (Signed)
Also.  Did review chart and go through the last 7 weights with a mean average loss of approximate 13 pounds.  Patient is fairly active going to the [facility] approximately 5 days a week he did show me his activity counter on his phone.  Patient also eating 2 meals a day with an occasional Ensure.  Patient does not feel poor.  Last colonoscopy was 10/26/2021 with polyps that were removed per patient report this was done to the [place] patient does have a remote history of smoking.  We will check basic labs and chest x-ray as patient former smoker and history of TB in the past

## 2022-01-28 NOTE — Assessment & Plan Note (Signed)
Being a former smoker and unexplained weight loss we will obtain a chest x-ray.  Patient's not having any symptoms currently

## 2022-01-29 ENCOUNTER — Other Ambulatory Visit: Payer: Self-pay | Admitting: Nurse Practitioner

## 2022-01-29 DIAGNOSIS — R790 Abnormal level of blood mineral: Secondary | ICD-10-CM

## 2022-02-11 ENCOUNTER — Ambulatory Visit: Payer: Medicare Other | Admitting: Orthopaedic Surgery

## 2022-02-11 ENCOUNTER — Encounter: Payer: Self-pay | Admitting: Orthopaedic Surgery

## 2022-02-11 DIAGNOSIS — M75102 Unspecified rotator cuff tear or rupture of left shoulder, not specified as traumatic: Secondary | ICD-10-CM | POA: Diagnosis not present

## 2022-02-11 DIAGNOSIS — M25512 Pain in left shoulder: Secondary | ICD-10-CM | POA: Diagnosis not present

## 2022-02-11 DIAGNOSIS — M12812 Other specific arthropathies, not elsewhere classified, left shoulder: Secondary | ICD-10-CM | POA: Diagnosis not present

## 2022-02-11 DIAGNOSIS — G8929 Other chronic pain: Secondary | ICD-10-CM | POA: Diagnosis not present

## 2022-02-11 NOTE — Progress Notes (Signed)
Office Visit Note   Patient: James Pearson           Date of Birth: 04-Sep-1945           MRN: [id number] Visit Date: 02/11/2022              Requested by: Michela Pitcher, NP [address] [place],  [place] [id number] PCP: Michela Pitcher, NP   Assessment & Plan: Visit Diagnoses:  1. Chronic left shoulder pain   2. Rotator cuff tear arthropathy of left shoulder     Plan: Patient is a pleasant 76 year old gentleman with a long history of left shoulder pain.  This is now been going on a year.  He has a remote history of a right rotator cuff repair and he says this feels similar.  He has tried exercises and has been told that findings were consistent with a rotator cuff tear an MRI was to be ordered last year but he did not go through with this because  he was caring for his wife who is ill.  The pain is gotten significant enough now and limits him that he would like to go forward with the MRI.  He had films of his left shoulder last year that did not demonstrate any obvious degenerative change.  The humeral head was centered in the glenoid.  There were degenerative changes at the [facility] joint and some sclerosis about the greater tuberosity.  Obviously concerned that he may have a rotator cuff tear in the left shoulder  Follow-Up Instructions: After MRI  Orders:  Orders Placed This Encounter  Procedures   MR Shoulder Left w/o contrast   No orders of the defined types were placed in this encounter.     Procedures: No procedures performed   Clinical Data: No additional findings.   Subjective: Chief Complaint  Patient presents with   Left Shoulder - Pain  Patient presents today for a two month follow up on his left shoulder. He said that he has received a cortisone injection with Bevely Palmer, but has not noticed any improvement. He also has a lump on the backside of his shoulder. He is right hand dominant.   HPI  Review of Systems  All other systems reviewed and are  negative.    Objective: Vital Signs: There were no vitals taken for this visit.  Physical Exam Constitutional:      Appearance: Normal appearance.  Pulmonary:     Effort: Pulmonary effort is normal.  Skin:    General: Skin is warm and dry.  Neurological:     Mental Status: He is alert.  Psychiatric:        Mood and Affect: Mood normal.     Ortho Exam Examination of his left shoulder.  He does have a small sebaceous cyst which was examined.  To the posterior shoulder.  No redness no cellulitis.  He has forward elevation to about 110 degrees can go further but has quite a bit of pain.  He has positive empty cans test.  He is strength is 5 out of 5 just limited by a little bit of pain.  No radiation of symptoms down his arm.  Negative speeds test positive impingement findings.  The small mass in the posterior superior aspect of the shoulder is a sebaceous gland cyst that I could express a little bit of sebum but was not red or painful Specialty Comments:  No specialty comments available.  Imaging: No results found.  PMFS History: Patient Active Problem List   Diagnosis Date Noted   Former smoker 01/28/2022   Unexplained weight loss 01/28/2022   Lower extremity edema 10/09/2021   Pain, dental 10/09/2021   Preventative health care 10/09/2021   Tremor 09/18/2021   Left hip pain 09/18/2021   Anxiety and depression 08/26/2021   Chronic pain of right knee 08/26/2021   Grief 08/26/2021   Rotator cuff tear arthropathy 06/23/2021   Psychosexual dysfunction with inhibited sexual excitement 06/23/2021   Obesity 06/23/2021   Benign neoplasm of colon 06/23/2021   Carpal tunnel syndrome 06/23/2021   History of adenomatous polyp of colon 06/23/2021   Brachial neuritis 06/23/2021   Parkinson's disease ([place]) 06/23/2021   Dysuria 03/18/2021   Low testosterone in male 03/18/2021   Hypokalemia 01/06/2021   Pain in left shoulder 11/05/2020   Bilateral primary osteoarthritis of knee  12/19/2019   Unilateral primary osteoarthritis, right knee 10/30/2019   Insomnia 05/06/2015   Essential hypertension 05/06/2015   Chronic back pain 05/06/2015   Type 2 diabetes mellitus without complication ([place]) 17/91/5056   Stroke ([place]) 05/06/2015   HLD (hyperlipidemia) 05/06/2015   BPH (benign prostatic hyperplasia) 05/06/2015   Esophageal reflux 05/06/2015   History of TB (tuberculosis) 05/06/2015   Iron deficiency 04/10/2015   Past Medical History:  Diagnosis Date   Arthritis    GERD (gastroesophageal reflux disease)    Hepatitis    Hypertension    Parkinson's disease ([place])    Positive TB test    as a child   Stroke ([place]) 08/10/2011    Family History  Problem Relation Age of Onset   Heart disease Mother    Hypertension Mother    Tremor Father    COPD Sister    Heart disease Sister    Hypertension Sister     Past Surgical History:  Procedure Laterality Date   CHOLECYSTECTOMY     HAND SURGERY     9 subsequent surgeries   rotator cuff surgery     Social History   Occupational History   Not on file  Tobacco Use   Smoking status: Former    Types: Cigarettes    Quit date: 08/09/1990    Years since quitting: 31.5   Smokeless tobacco: Never  Vaping Use   Vaping Use: Never used  Substance and Sexual Activity   Alcohol use: No    Alcohol/week: 0.0 standard drinks of alcohol   Drug use: No   Sexual activity: Yes

## 2022-02-12 ENCOUNTER — Ambulatory Visit: Payer: Medicare Other | Admitting: Physician Assistant

## 2022-02-17 ENCOUNTER — Ambulatory Visit: Payer: Medicare Other | Admitting: Orthopaedic Surgery

## 2022-02-23 ENCOUNTER — Ambulatory Visit
Admission: RE | Admit: 2022-02-23 | Discharge: 2022-02-23 | Disposition: A | Discharge status: Home / Self Care | Payer: Medicare Other | Source: Ambulatory Visit | Attending: Orthopaedic Surgery | Admitting: Orthopaedic Surgery

## 2022-02-23 ENCOUNTER — Other Ambulatory Visit: Payer: Medicare Other

## 2022-02-23 DIAGNOSIS — M75112 Incomplete rotator cuff tear or rupture of left shoulder, not specified as traumatic: Secondary | ICD-10-CM | POA: Diagnosis not present

## 2022-02-23 DIAGNOSIS — G8929 Other chronic pain: Secondary | ICD-10-CM

## 2022-02-23 DIAGNOSIS — R531 Weakness: Secondary | ICD-10-CM | POA: Diagnosis not present

## 2022-02-23 DIAGNOSIS — M19012 Primary osteoarthritis, left shoulder: Secondary | ICD-10-CM | POA: Diagnosis not present

## 2022-03-02 ENCOUNTER — Other Ambulatory Visit: Payer: Self-pay

## 2022-03-02 ENCOUNTER — Encounter: Payer: Self-pay | Admitting: Orthopaedic Surgery

## 2022-03-02 ENCOUNTER — Ambulatory Visit: Payer: Medicare Other | Admitting: Orthopaedic Surgery

## 2022-03-02 DIAGNOSIS — M25512 Pain in left shoulder: Secondary | ICD-10-CM

## 2022-03-02 DIAGNOSIS — G8929 Other chronic pain: Secondary | ICD-10-CM

## 2022-03-02 MED ORDER — METHYLPREDNISOLONE ACETATE 40 MG/ML IJ SUSP
80.0000 mg | INTRAMUSCULAR | Status: AC | PRN
  Administered 2022-03-02: 80 mg via INTRA_ARTICULAR

## 2022-03-02 MED ORDER — BUPIVACAINE HCL 0.25 % IJ SOLN
2.0000 mL | INTRAMUSCULAR | Status: AC | PRN
  Administered 2022-03-02: 2 mL via INTRA_ARTICULAR

## 2022-03-02 MED ORDER — LIDOCAINE HCL 1 % IJ SOLN
2.0000 mL | INTRAMUSCULAR | Status: AC | PRN
  Administered 2022-03-02: 2 mL

## 2022-03-02 NOTE — Progress Notes (Signed)
Office Visit Note   Patient: James Pearson           Date of Birth: [birth year]           MRN: [id number] Visit Date: 03/02/2022              Requested by: James Pearson,  [place] [address] PCP: James Pearson  Chief Complaint  Patient presents with   Left Shoulder - Follow-up      HPI: James Pearson is a pleasant 76 year old gentleman with a history of left shoulder pain.  He had tried conservative treatment, but still continued to have difficulties.  He is status post right rotator cuff repair in the past.  Because he failed conservative treatment he had limitations with motion and pain an MRI was ordered.  Concerns were for a rotator cuff tear.  He is here to review this today.  He actually says the pain is a little bit better today and he has better motion.  His last subacromial injection did not really help him very much  Assessment & Plan: Visit Diagnoses:  1. Left shoulder pain, unspecified chronicity     Plan: MRI does show a high-grade partial-thickness articular sided horizontal linear tear within the supraspinatus tendon.  Also has some AC arthritis.  Also some glenohumeral arthritis with moderate thinning of the glenoid and humeral head cartilage.  He may do well with physical therapy especially has his shoulder is not hurting quite as much.  Based on exam today with his stiffness and pain with external rotation would go forward with an intra-articular injection as well.  Office 1 month for review  Follow-Up Instructions: Return if symptoms worsen or fail to improve.   Ortho Exam  Patient is alert, oriented, no adenopathy, well-dressed, normal affect, normal respiratory effort. Examination of the left shoulder demonstrate he has better active range of motion to almost full forward extension.  Can internally rotate behind his back.  Has stiffness and pain with external rotation especially when compared to the unaffected shoulder.  Distal  sensation is intact strength is intact  Imaging: No results found. No images are attached to the encounter.  Labs: Lab Results  Component Value Date   HGBA1C 6.9 (H) 01/28/2022   HGBA1C 7.1 (H) 10/09/2021   HGBA1C 6.7 08/06/2021     Lab Results  Component Value Date   ALBUMIN 3.9 01/28/2022   ALBUMIN 4.3 01/12/2021   ALBUMIN 4.2 01/22/2019    No results found for: "MG" Lab Results  Component Value Date   VD25OH 48.4 05/14/2021    No results found for: "PREALBUMIN"    Latest Ref Rng & Units 01/28/2022   12:40 PM 10/09/2021    3:27 PM 05/14/2021   12:00 AM  CBC EXTENDED  WBC 4.0 - 10.5 K/uL 7.3  9.0  8.8   RBC 4.22 - 5.81 Mil/uL 6.42  5.97  3.8   Hemoglobin 13.0 - 17.0 g/dL 17.1  14.1  15.0   HCT 39.0 - 52.0 % 53.3  46.5  48   Platelets 150.0 - 400.0 K/uL 167.0  246  175   NEUT# 1,500 - 7,800 cells/uL  6,525    Lymph# 850 - 3,900 cells/uL  1,548       There is no height or weight on file to calculate BMI.  Orders:  Orders Placed This Encounter  Procedures   Large Joint Inj: L glenohumeral   No orders of the  defined types were placed in this encounter.    Procedures: Large Joint Inj: L glenohumeral on 03/02/2022 4:38 PM Indications: diagnostic evaluation and pain Details: 25 G 1.5 in needle, anterior approach  Arthrogram: No  Medications: 2 mL lidocaine 1 %; 80 mg methylPREDNISolone acetate 40 MG/ML; 2 mL bupivacaine 0.25 % Outcome: tolerated well, no immediate complications Procedure, treatment alternatives, risks and benefits explained, specific risks discussed. Consent was given by the patient.      Clinical Data: No additional findings.  ROS:  All other systems negative, except as noted in the HPI. Review of Systems  Objective: Vital Signs: There were no vitals taken for this visit.  Specialty Comments:  No specialty comments available.  PMFS History: Patient Active Problem List   Diagnosis Date Noted   Former smoker 01/28/2022    Unexplained weight loss 01/28/2022   Lower extremity edema 10/09/2021   Pain, dental 10/09/2021   Preventative health care 10/09/2021   Tremor 09/18/2021   Left hip pain 09/18/2021   Anxiety and depression 08/26/2021   Chronic pain of right knee 08/26/2021   Grief 08/26/2021   Rotator cuff tear arthropathy 06/23/2021   Psychosexual dysfunction with inhibited sexual excitement 06/23/2021   Obesity 06/23/2021   Benign neoplasm of colon 06/23/2021   Carpal tunnel syndrome 06/23/2021   History of adenomatous polyp of colon 06/23/2021   Brachial neuritis 06/23/2021   Parkinson's disease ([place]) 06/23/2021   Dysuria 03/18/2021   Low testosterone in male 03/18/2021   Hypokalemia 01/06/2021   Pain in left shoulder 11/05/2020   Bilateral primary osteoarthritis of knee 12/19/2019   Unilateral primary osteoarthritis, right knee 10/30/2019   Insomnia 05/06/2015   Essential hypertension 05/06/2015   Chronic back pain 05/06/2015   Type 2 diabetes mellitus without complication ([place]) 56/38/7564   Stroke ([place]) 05/06/2015   HLD (hyperlipidemia) 05/06/2015   BPH (benign prostatic hyperplasia) 05/06/2015   Esophageal reflux 05/06/2015   History of TB (tuberculosis) 05/06/2015   Iron deficiency 04/10/2015   Past Medical History:  Diagnosis Date   Arthritis    GERD (gastroesophageal reflux disease)    Hepatitis    Hypertension    Parkinson's disease ([place])    Positive TB test    as a child   Stroke ([place]) 08/10/2011    Family History  Problem Relation Age of Onset   Heart disease Mother    Hypertension Mother    Tremor Father    COPD Sister    Heart disease Sister    Hypertension Sister     Past Surgical History:  Procedure Laterality Date   CHOLECYSTECTOMY     HAND SURGERY     9 subsequent surgeries   rotator cuff surgery     Social History   Occupational History   Not on file  Tobacco Use   Smoking status: Former    Types: Cigarettes    Quit date: 08/09/1990    Years since  quitting: 31.5   Smokeless tobacco: Never  Vaping Use   Vaping Use: Never used  Substance and Sexual Activity   Alcohol use: No    Alcohol/week: 0.0 standard drinks of alcohol   Drug use: No   Sexual activity: Yes

## 2022-03-10 ENCOUNTER — Ambulatory Visit: Payer: Medicare Other | Admitting: Physical Therapy

## 2022-03-12 ENCOUNTER — Encounter: Payer: Self-pay | Admitting: Rehabilitative and Restorative Service Providers"

## 2022-03-12 ENCOUNTER — Ambulatory Visit: Payer: Medicare Other | Admitting: Rehabilitative and Restorative Service Providers"

## 2022-03-12 DIAGNOSIS — M25512 Pain in left shoulder: Secondary | ICD-10-CM | POA: Diagnosis not present

## 2022-03-12 DIAGNOSIS — M25612 Stiffness of left shoulder, not elsewhere classified: Secondary | ICD-10-CM | POA: Diagnosis not present

## 2022-03-12 DIAGNOSIS — M6281 Muscle weakness (generalized): Secondary | ICD-10-CM | POA: Diagnosis not present

## 2022-03-12 NOTE — Therapy (Signed)
OUTPATIENT PHYSICAL THERAPY SHOULDER EVALUATION   Patient Name: James Pearson MRN: [id number] DOB:Apr 26, 1946, 76 y.o., male Today's Date: 03/12/2022   PT End of Session - 03/12/22 1654     Visit Number 1    Number of Visits 12    PT Start Time 1300    PT Stop Time 5361    PT Time Calculation (min) 45 min    Activity Tolerance Patient tolerated treatment well    Behavior During Therapy WFL for tasks assessed/performed             Past Medical History:  Diagnosis Date   Arthritis    GERD (gastroesophageal reflux disease)    Hepatitis    Hypertension    Parkinson's disease ([place])    Positive TB test    as a child   Stroke ([place]) 08/10/2011   Past Surgical History:  Procedure Laterality Date   CHOLECYSTECTOMY     HAND SURGERY     9 subsequent surgeries   rotator cuff surgery     Patient Active Problem List   Diagnosis Date Noted   Former smoker 01/28/2022   Unexplained weight loss 01/28/2022   Lower extremity edema 10/09/2021   Pain, dental 10/09/2021   Preventative health care 10/09/2021   Tremor 09/18/2021   Left hip pain 09/18/2021   Anxiety and depression 08/26/2021   Chronic pain of right knee 08/26/2021   Grief 08/26/2021   Rotator cuff tear arthropathy 06/23/2021   Psychosexual dysfunction with inhibited sexual excitement 06/23/2021   Obesity 06/23/2021   Benign neoplasm of colon 06/23/2021   Carpal tunnel syndrome 06/23/2021   History of adenomatous polyp of colon 06/23/2021   Brachial neuritis 06/23/2021   Parkinson's disease ([place]) 06/23/2021   Dysuria 03/18/2021   Low testosterone in male 03/18/2021   Hypokalemia 01/06/2021   Pain in left shoulder 11/05/2020   Bilateral primary osteoarthritis of knee 12/19/2019   Unilateral primary osteoarthritis, right knee 10/30/2019   Insomnia 05/06/2015   Essential hypertension 05/06/2015   Chronic back pain 05/06/2015   Type 2 diabetes mellitus without complication ([place]) 44/31/5400   Stroke ([place])  05/06/2015   HLD (hyperlipidemia) 05/06/2015   BPH (benign prostatic hyperplasia) 05/06/2015   Esophageal reflux 05/06/2015   History of TB (tuberculosis) 05/06/2015   Iron deficiency 04/10/2015    PCP: Michela Pitcher, NP  REFERRING PROVIDER: Garald Balding, MD  REFERRING DIAG: [phone] (ICD-10-CM) - Chronic left shoulder pain   THERAPY DIAG:  Stiffness of left shoulder, not elsewhere classified  Muscle weakness (generalized)  Left shoulder pain, unspecified chronicity  Rationale for Evaluation and Treatment Rehabilitation  ONSET DATE: Worsening over the past several months  SUBJECTIVE:  SUBJECTIVE STATEMENT: Coltrane has been skipping his upper body workouts at the gym (he is very consistent at the gym) due to increasing L shoulder pain.  MRI shows a tear, some AC joint arthritis/spurring and DJD.  He would like to avoid another RTC repair (previous R RTC repair) and return to the gym with needed modifications to his workout.  PERTINENT HISTORY: OA hip and knees, hepatitis, HTN, Parkinson's, previous stroke, previous R RTC and multiple hand surgeries, type 2 DM  PAIN:  Are you having pain? Yes: NPRS scale: 2-6/10 Pain location: L shoulder Pain description: Ache, sore Aggravating factors: L shoulder use and sleeping Relieving factors: Change of position   PRECAUTIONS: Other: Torn RTC  WEIGHT BEARING RESTRICTIONS No  FALLS:  Has patient fallen in last 6 months? No  LIVING ENVIRONMENT: Lives with: lives alone Lives in: House/apartment Stairs: No Has following equipment at home: None  OCCUPATION: Retired  PLOF: Independent  PATIENT GOALS Return to gym for appropriate UE strengthening  OBJECTIVE:   DIAGNOSTIC FINDINGS:  1. High-grade partial-thickness articular sided  horizontal linear tear within the supraspinatus tendon insertion measuring up to 17 mm in AP dimension. No tendon retraction. 2. Mild-to-moderate degenerative changes of the acromioclavicular joint. Mild distal lateral subacromial spurring. 3. Moderate thinning of the glenohumeral cartilage.  PATIENT SURVEYS:  FOTO 49 (Goal 61 in 11 visits)  COGNITION:  Overall cognitive status: Within functional limits for tasks assessed     SENSATION: No complaints of peripheral pain or paresthesias  POSTURE: Mild IR and protracted shoulders  UPPER EXTREMITY ROM:   Passive ROM Right eval Left eval  Shoulder flexion 140 145  Shoulder extension    Shoulder abduction    Shoulder horizontal adduction 30 30  Shoulder internal rotation 50 55  Shoulder external rotation 70 65  Elbow flexion    Elbow extension    Wrist flexion    Wrist extension    Wrist ulnar deviation    Wrist radial deviation    Wrist pronation    Wrist supination    (Blank rows = not tested)  UPPER EXTREMITY Strength:  Hand-held dynamometer in pounds Right eval Left eval  Shoulder flexion    Shoulder extension    Shoulder abduction    Shoulder adduction    Shoulder internal rotation 56.6 42.7  Shoulder external rotation 33.1 28.7  Middle trapezius    Lower trapezius    Elbow flexion    Elbow extension    Wrist flexion    Wrist extension    Wrist ulnar deviation    Wrist radial deviation    Wrist pronation    Wrist supination    Grip strength (lbs)    (Blank rows = not tested)  TODAY'S TREATMENT:  03/12/2022 Therapeutic Exercises Shoulder blade pinches 5X 5 seconds Rows with Blue theraband 20X 3 seconds Supine scapular protraction with 5# 20X 3 seconds Supine IR/ER stretch 10X 10 seconds each  Functional Activities: Review shoulder model, imaging and discuss expectations with PT (IE non-surgical)   PATIENT EDUCATION: Education details: See above Person educated: Patient Education method:  Explanation, Demonstration, Tactile cues, Verbal cues, and Handouts Education comprehension: verbalized understanding, returned demonstration, verbal cues required, tactile cues required, and needs further education   HOME EXERCISE PROGRAM: Access Code: [email or web address] URL: [email or web address] Date: 03/12/2022 Prepared by: Vista Mink  Exercises - Standing Scapular Retraction  - 5 x daily - 7 x weekly - 1 sets - 5 reps - 5 second hold - Standing Row with Anchored Resistance  Band with PLB  - 2 x daily - 7 x weekly - 1 sets - 20-30 reps - 3 hold - Supine Scapular Protraction in Flexion with Dumbbells  - 2 x daily - 7 x weekly - 1 sets - 20 reps - 3 seconds hold - Supine Shoulder External Rotation Stretch  - 2 x daily - 7 x weekly - 1 sets - 10-20 reps - 10 seconds hold - Supine Shoulder Internal Rotation Stretch  - 2 x daily - 7 x weekly - 1 sets - 10-20 reps - 10 seconds hold  ASSESSMENT:  CLINICAL IMPRESSION: Patient is a 76 y.o. male who was seen today for physical therapy evaluation and treatment for L shoulder OA and RTC tear.  [name] has global capsular tightness B (L > R) and some scapular and rotator cuff strength deficits ER B and IR L only).  He will benefit from capsular stretching, scapular and RTC strengthening to reduce impingement and improve L shoulder pain and function.   OBJECTIVE IMPAIRMENTS decreased activity tolerance, decreased endurance, decreased knowledge of condition, decreased ROM, decreased strength, decreased safety awareness, increased edema, impaired perceived functional ability, impaired flexibility, impaired UE functional use, postural dysfunction, and pain.   ACTIVITY LIMITATIONS carrying, lifting, sleeping, and reach over head  PARTICIPATION LIMITATIONS: community activity and the gym  PERSONAL FACTORS OA hip and knees, hepatitis, HTN, Parkinson's, previous stroke, previous R RTC and multiple hand surgeries, type 2 DM are also affecting patient's  functional outcome.   REHAB POTENTIAL: Good  CLINICAL DECISION MAKING: Stable/uncomplicated  EVALUATION COMPLEXITY: Low   GOALS: Goals reviewed with patient? Yes  SHORT TERM GOALS: Target date: 04/09/2022  (Remove Blue Hyperlink)  Improve B shoulder AROM for flexion to 150; IR to 60; ER to 80 and horizontal adduction to 35 degrees Baseline: See objective Goal status: INITIAL  2.  Improve B shoulder strength as assessed by hand-held dynamometer Baseline: See objective Goal status: INITIAL  3.  [name] will be independent with his day 1 HEP Baseline: Started 03/12/2022 Goal status: INITIAL  LONG TERM GOALS: Target date: 05/07/2022  (Remove Blue Hyperlink)  Improve FOTO to 61 Baseline: 49 Goal status: INITIAL  2.  Improve L shoulder pain to consistently 0-3/10 on the NPRS Baseline: 2-6/10 Goal status: INITIAL  3.  Improve B shoulder AROM to 85% or graeter of flexion to 170; ER to 90; IR to 60 and horizontal adduction to 40 degrees Baseline: 145/140; 65/70; 55/50and 30/30 respectively Goal status: INITIAL  4.  Improve L shoulder strength to 30 pounds ER and 45 pounds IR Baseline: 28.7 and 42.7 Goal status: INITIAL  5.  [name] will be independent with his HEP at home and the gym at [place] Baseline: Started 03/12/2022 Goal status: INITIAL   PLAN: PT FREQUENCY: 1-2x/week  PT DURATION: 8 weeks  PLANNED INTERVENTIONS: Therapeutic exercises, Therapeutic activity, Neuromuscular re-education, Patient/Family education, Joint mobilization, Cryotherapy, and Manual therapy  PLAN FOR NEXT SESSION: Review HEP, capsular stretching (limited in all directions), add theraband IR/ER with slow eccentrics   Farley Ly, PT, MPT 03/12/2022, 5:13 PM

## 2022-03-13 ENCOUNTER — Other Ambulatory Visit: Payer: Self-pay | Admitting: Nurse Practitioner

## 2022-03-13 DIAGNOSIS — E7800 Pure hypercholesterolemia, unspecified: Secondary | ICD-10-CM

## 2022-03-19 ENCOUNTER — Encounter (HOSPITAL_COMMUNITY): Payer: Self-pay

## 2022-03-19 ENCOUNTER — Ambulatory Visit (HOSPITAL_COMMUNITY)
Admission: RE | Admit: 2022-03-19 | Discharge: 2022-03-19 | Disposition: A | Discharge status: Home / Self Care | Payer: Medicare Other | Source: Ambulatory Visit | Attending: Physician Assistant | Admitting: Physician Assistant

## 2022-03-19 ENCOUNTER — Telehealth: Payer: Self-pay | Admitting: Nurse Practitioner

## 2022-03-19 VITALS — BP 136/76 | HR 77 | Temp 98.3°F | Resp 18

## 2022-03-19 DIAGNOSIS — M7989 Other specified soft tissue disorders: Secondary | ICD-10-CM

## 2022-03-19 DIAGNOSIS — F32A Depression, unspecified: Secondary | ICD-10-CM | POA: Insufficient documentation

## 2022-03-19 DIAGNOSIS — F419 Anxiety disorder, unspecified: Secondary | ICD-10-CM | POA: Diagnosis not present

## 2022-03-19 DIAGNOSIS — R251 Tremor, unspecified: Secondary | ICD-10-CM | POA: Diagnosis not present

## 2022-03-19 DIAGNOSIS — R634 Abnormal weight loss: Secondary | ICD-10-CM | POA: Insufficient documentation

## 2022-03-19 DIAGNOSIS — R600 Localized edema: Secondary | ICD-10-CM | POA: Insufficient documentation

## 2022-03-19 LAB — CBC
HCT: 54.5 % — ABNORMAL HIGH (ref 39.0–52.0)
Hemoglobin: 18.2 g/dL — ABNORMAL HIGH (ref 13.0–17.0)
MCH: 28.1 pg (ref 26.0–34.0)
MCHC: 33.4 g/dL (ref 30.0–36.0)
MCV: 84.1 fL (ref 80.0–100.0)
Platelets: 164 10*3/uL (ref 150–400)
RBC: 6.48 MIL/uL — ABNORMAL HIGH (ref 4.22–5.81)
RDW: 17.1 % — ABNORMAL HIGH (ref 11.5–15.5)
WBC: 7.6 10*3/uL (ref 4.0–10.5)
nRBC: 0 % (ref 0.0–0.2)

## 2022-03-19 LAB — COMPREHENSIVE METABOLIC PANEL
ALT: 24 U/L (ref 0–44)
AST: 26 U/L (ref 15–41)
Albumin: 3.6 g/dL (ref 3.5–5.0)
Alkaline Phosphatase: 65 U/L (ref 38–126)
Anion gap: 7 (ref 5–15)
BUN: 7 mg/dL — ABNORMAL LOW (ref 8–23)
CO2: 31 mmol/L (ref 22–32)
Calcium: 9.1 mg/dL (ref 8.9–10.3)
Chloride: 101 mmol/L (ref 98–111)
Creatinine, Ser: 1.17 mg/dL (ref 0.61–1.24)
GFR, Estimated: >60 mL/min (ref >60)
Glucose, Bld: 130 mg/dL — ABNORMAL HIGH (ref 70–99)
Potassium: 3.5 mmol/L (ref 3.5–5.1)
Sodium: 139 mmol/L (ref 135–145)
Total Bilirubin: 1.3 mg/dL — ABNORMAL HIGH (ref 0.3–1.2)
Total Protein: 7 g/dL (ref 6.5–8.1)

## 2022-03-19 LAB — POCT URINALYSIS DIPSTICK, ED / UC
Bilirubin Urine: NEGATIVE
Glucose, UA: NEGATIVE mg/dL
Hgb urine dipstick: NEGATIVE
Ketones, ur: NEGATIVE mg/dL
Leukocytes,Ua: NEGATIVE
Nitrite: NEGATIVE
Protein, ur: NEGATIVE mg/dL
Specific Gravity, Urine: 1.015 (ref 1.005–1.030)
Urobilinogen, UA: 0.2 mg/dL (ref 0.0–1.0)
pH: 6 (ref 5.0–8.0)

## 2022-03-19 LAB — BRAIN NATRIURETIC PEPTIDE: B Natriuretic Peptide: 17.9 pg/mL (ref 0.0–100.0)

## 2022-03-19 LAB — TSH: TSH: 0.926 u[IU]/mL (ref 0.350–4.500)

## 2022-03-19 NOTE — Telephone Encounter (Signed)
I spoke with pt after pt said was on hold long time and nurse did not come back on phone. Pt said starting today pt has tingling in both feet; pt said usually has a lot of swelling in ankles at night but not during the day. Pt said both ankles so swollen cannot see his ankles; both lower legs swollen; no redness, feels warm but pt thinks due to being hot today; no pain noted and No CP or SOB. Pt said except for the swelling he feels good. Pt already has appt to see Romilda Garret NP on 03/22/22 at 11:40; I scheduled pt an appt at [facility] 03/19/22 at 4 PM with UC and ED precautions given and pt voiced understanding. If pt is not having any problems on 03/22/22 pt will call at 8 AM to cancel appt with [facility] otherwise pt will keep appt on Monday.sending note to Romilda Garret NP and Anastasiya CMA.pt last saw Romilda Garret NP on 01/28/22.    [place] [facility] - Client TELEPHONE ADVICE RECORD AccessNurse Patient Name: James Pearson Gender: Male DOB: 26-Apr-1946 Age: 76 Y 9 M 26 D Return Phone Number: [phone] (Primary) Address[place]  [address] Client [facility] - Client Client Site [place] - Day Provider Romilda Garret- NP Contact Type Call Who Is Calling Patient / Member / Family / Caregiver Call Type Triage / Clinical Relationship To Patient Self Return Phone Number [phone] (Primary) Chief Complaint NUMBNESS/TINGLING- sudden on one side of the body or face Reason for Call Symptomatic / Request for [place] states he says he can't feel his ankles and they are swollen. Translation No Nurse Assessment Nurse: Raenette Rover, RN, Zella Ball Date/Time (Eastern Time): 03/19/2022 1:27:20 PM Confirm and document reason for call. If symptomatic, describe symptoms. ---Caller states he says he can't feel his ankles and they are swollen. No injury Does  the patient have any new or worsening symptoms? ---Yes Will a triage be completed? ---Yes Related visit to physician within the last 2 weeks? ---No Does the PT have any chronic conditions? (i.e. diabetes, asthma, this includes High risk factors for pregnancy, etc.) ---Yes List chronic conditions. ---pre diabetic Is this a behavioral health or substance abuse call? ---No Guidelines Guideline Title Affirmed Question Affirmed Notes Nurse Date/Time Eilene Ghazi Time) Leg Swelling and Edema [1] Thigh, calf, or ankle swelling AND [2] bilateral AND [3] 1 side is more swollen Raenette Rover, RN, Zella Ball 03/19/2022 1:28:44 PM Disp. Time Eilene Ghazi Time) Disposition Final User 03/19/2022 1:25:43 PM Send to Urgent Joen Laura, [place] 03/19/2022 1:36:40 PM See HCP within 4 Hours (or PCP triage) Yes Raenette Rover, RN, Zella Ball PLEASE NOTE: All timestamps contained within this report are represented as [place] Standard Time. CONFIDENTIALTY NOTICE: This fax transmission is intended only for the addressee. It contains information that is legally privileged, confidential or otherwise protected from use or disclosure. If you are not the intended recipient, you are strictly prohibited from reviewing, disclosing, copying using or disseminating any of this information or taking any action in reliance on or regarding this information. If you have received this fax in error, please notify us immediately by telephone so that we can arrange for its return to [place]. Phone: [phone], Toll-Free: [phone], Fax: [phone] Page: 2 of 2 Call Id: [id number] Final Disposition 03/19/2022 1:36:40 PM See HCP within 4 Hours (or PCP triage) Willaim Sheng, RN, Herbert Deaner Disagree/Comply Comply Caller Understands  Yes PreDisposition InappropriateToAsk Care Advice Given Per Guideline SEE HCP (OR PCP TRIAGE) WITHIN 4 HOURS: CARE ADVICE given per Leg Swelling and Edema (Adult) guideline. * Chest pain or shortness of breath occurs.  CALL EMS IF: Comments User: Wilson Singer, RN Date/Time Eilene Ghazi Time): 03/19/2022 1:31:29 PM Usually walks 5 miles a day at the [facility] and today only walked 2 miles. Had some salty food yesterday. Hx of HTN. User: Wilson Singer, RN Date/Time Eilene Ghazi Time): 03/19/2022 1:39:57 PM transferred back to main line for an appt. User: Wilson Singer, RN Date/Time Eilene Ghazi Time): 03/19/2022 1:54:46 PM Due to long wait times on the back line. Advised caller to remainon the line for a scheduler. If no appt avaialbe go to UC or ER. Caller verbalized understanding. Referrals REFERRED TO PCP OFFIC

## 2022-03-19 NOTE — ED Triage Notes (Signed)
Patient complains that both legs are swelling and have been like that for about 7-8 years. [facility] Called his PCP and they advised him to come to UC.

## 2022-03-19 NOTE — Telephone Encounter (Signed)
Noted! Thank you

## 2022-03-19 NOTE — Telephone Encounter (Signed)
Patient called and stated that he has tingling when he walks. Patient has been scheduled for an appointment on 03/22/2022 at 11:40. Patient has been sent to access nurse.

## 2022-03-19 NOTE — Discharge Instructions (Addendum)
There is no protein in your urine.  I will contact you with your blood work results once I have them.  If they are significantly abnormal I will call you.  It is possible that amlodipine is contributing to your symptoms.  I do recommend that you contact your primary care to discuss potentially changing your medication regimen as we discussed.  Continue your hydrochlorothiazide.  Avoid sodium.  Keep your legs elevated.  Use compression stockings.  If anything worsens including shortness of breath, chest pain, worsening swelling, pain you need to be seen immediately.

## 2022-03-19 NOTE — ED Provider Notes (Signed)
[facility]    CSN: [id number] Arrival date & time: 03/19/22  1621      History   Chief Complaint Chief Complaint  Patient presents with   Leg Swelling    Entered by patient    HPI James Pearson is a 76 y.o. male.   Patient presents today with a prolonged history (many years) of bilateral lower extremity edema that has worsened in the past several weeks.  She does report that the past several days he has had an increase in sodium consumption but generally monitors his diet for sodium.  He is taking hydrochlorothiazide for blood pressure and swelling and has not missed any doses.  He is prescribed amlodipine use and has been on this medication for many years.  He has previously tried to discontinue this medication but was told by his primary care provider that he needed to continue it.  He denies any recent medication changes.  Denies history of heart failure, chronic kidney disease, liver condition, thyroid condition.  He denies any associated chest pain, shortness of breath, sudden weight gain.  He has not had an echocardiogram in the past.    Past Medical History:  Diagnosis Date   Arthritis    GERD (gastroesophageal reflux disease)    Hepatitis    Hypertension    Parkinson's disease ([place])    Positive TB test    as a child   Stroke ([place]) 08/10/2011    Patient Active Problem List   Diagnosis Date Noted   Former smoker 01/28/2022   Unexplained weight loss 01/28/2022   Lower extremity edema 10/09/2021   Pain, dental 10/09/2021   Preventative health care 10/09/2021   Tremor 09/18/2021   Left hip pain 09/18/2021   Anxiety and depression 08/26/2021   Chronic pain of right knee 08/26/2021   Grief 08/26/2021   Rotator cuff tear arthropathy 06/23/2021   Psychosexual dysfunction with inhibited sexual excitement 06/23/2021   Obesity 06/23/2021   Benign neoplasm of colon 06/23/2021   Carpal tunnel syndrome 06/23/2021   History of adenomatous polyp of colon  06/23/2021   Brachial neuritis 06/23/2021   Parkinson's disease ([place]) 06/23/2021   Dysuria 03/18/2021   Low testosterone in male 03/18/2021   Hypokalemia 01/06/2021   Pain in left shoulder 11/05/2020   Bilateral primary osteoarthritis of knee 12/19/2019   Unilateral primary osteoarthritis, right knee 10/30/2019   Insomnia 05/06/2015   Essential hypertension 05/06/2015   Chronic back pain 05/06/2015   Type 2 diabetes mellitus without complication ([place]) 96/29/5284   Stroke ([place]) 05/06/2015   HLD (hyperlipidemia) 05/06/2015   BPH (benign prostatic hyperplasia) 05/06/2015   Esophageal reflux 05/06/2015   History of TB (tuberculosis) 05/06/2015   Iron deficiency 04/10/2015    Past Surgical History:  Procedure Laterality Date   CHOLECYSTECTOMY     HAND SURGERY     9 subsequent surgeries   rotator cuff surgery         Home Medications    Prior to Admission medications   Medication Sig Start Date End Date Taking? Authorizing Provider  amLODipine (NORVASC) 10 MG tablet TAKE 1 TABLET BY MOUTH DAILY 01/14/22  Yes Dutch Quint B, FNP  aspirin 81 MG chewable tablet Chew 81 mg by mouth daily.   Yes [provider]  atorvastatin (LIPITOR) 80 MG tablet TAKE 1 TABLET BY MOUTH DAILY 03/15/22  Yes Michela Pitcher, NP  CALCIUM-VITAMIN D PO Take by mouth. Calcium 600 mg and vitamin d 400 mg   Yes [provider]  clobetasol (TEMOVATE) 0.05 % external solution Apply 1 application topically 2 (two) times daily. Apply to affected areas on scalp until itchy rash clears/ PRN 10/30/19  Yes Brendolyn Patty, MD  cyclobenzaprine (FLEXERIL) 5 MG tablet Take 1 tablet (5 mg total) by mouth at bedtime as needed for muscle spasms. 11/03/20  Yes Jearld Fenton, NP  enalapril (VASOTEC) 20 MG tablet TAKE 1 TABLET BY MOUTH DAILY 01/14/22  Yes Dutch Quint B, FNP  Fluocinolone Acetonide Body 0.01 % OIL Apply to scalp, face and ears QD to BID Patient taking differently: Apply 1 application  topically  daily as needed (irritation). 10/30/19  Yes Brendolyn Patty, MD  hydrochlorothiazide (HYDRODIURIL) 25 MG tablet TAKE 1 TABLET BY MOUTH DAILY 01/14/22  Yes Dutch Quint B, FNP  Iron, Ferrous Sulfate, 325 (65 Fe) MG TABS Take 325 mg by mouth daily. 10/22/21  Yes Michela Pitcher, NP  ketoconazole (NIZORAL) 2 % shampoo APPLY THREE TIMES WEEKLY, LET SIT SEVERAL MINUTES BEFORE RINSING. Patient taking differently: Apply 1 application  topically as needed for irritation. 01/03/20  Yes Brendolyn Patty, MD  Oxycodone HCl 10 MG TABS Take by mouth every 4 (four) hours as needed for severe pain. Takes 5 to 10 mg at a time as needed   Yes [provider]  sildenafil (VIAGRA) 100 MG tablet Take 50-100 mg by mouth daily as needed for erectile dysfunction.   Yes [provider]  Specialty Vitamins Products (PROSTATE PO) Take 1 capsule by mouth at bedtime.   Yes [provider]  tamsulosin (FLOMAX) 0.4 MG CAPS capsule TAKE 2 CAPSULES BY MOUTH DAILY 11/02/21  Yes Michela Pitcher, NP  testosterone cypionate (DEPOTESTOSTERONE CYPIONATE) 200 MG/ML injection Inject 1 mL into the muscle every 14 (fourteen) days. 08/07/20  Yes [provider]  tobramycin-dexamethasone [facility]) ophthalmic solution every 4 (four) hours while awake.   Yes [provider]  traZODone (DESYREL) 50 MG tablet Take 1 tablet (50 mg total) by mouth at bedtime as needed. for sleep Patient taking differently: Take 50 mg by mouth at bedtime. 01/22/19  Yes Baity, Coralie Keens, NP  TURMERIC PO Take 1 capsule by mouth daily.   Yes [provider]    Family History Family History  Problem Relation Age of Onset   Heart disease Mother    Hypertension Mother    Tremor Father    COPD Sister    Heart disease Sister    Hypertension Sister     Social History Social History   Tobacco Use   Smoking status: Former    Types: Cigarettes    Quit date: 08/09/1990    Years since quitting: 31.6   Smokeless tobacco:  Never  Vaping Use   Vaping Use: Never used  Substance Use Topics   Alcohol use: No    Alcohol/week: 0.0 standard drinks of alcohol   Drug use: No     Allergies   Omeprazole, Gabapentin, Ibuprofen, Ketorolac tromethamine, Meloxicam, Morphine, Nsaids, Oxybutynin chloride, and Propranolol   Review of Systems Review of Systems  Constitutional:  Negative for activity change, appetite change, fatigue and fever.  Respiratory:  Negative for cough and shortness of breath.   Cardiovascular:  Positive for leg swelling. Negative for chest pain and palpitations.  Gastrointestinal:  Negative for abdominal pain, diarrhea, nausea and vomiting.  Neurological:  Negative for dizziness, light-headedness and headaches.     Physical Exam Triage Vital Signs ED Triage Vitals  Enc Vitals Group  BP 03/19/22 1645 136/76     Pulse Rate 03/19/22 1645 77     Resp 03/19/22 1645 18     Temp 03/19/22 1645 98.3 F (36.8 C)     Temp Source 03/19/22 1645 Oral     SpO2 03/19/22 1645 97 %     Weight --      Height --      Head Circumference --      Peak Flow --      Pain Score 03/19/22 1643 0     Pain Loc --      Pain Edu? --      Excl. in [place]? --    No data found.  Updated Vital Signs BP 136/76 (BP Location: Right Arm)   Pulse 77   Temp 98.3 F (36.8 C) (Oral)   Resp 18   SpO2 97%   Visual Acuity Right Eye Distance:   Left Eye Distance:   Bilateral Distance:    Right Eye Near:   Left Eye Near:    Bilateral Near:     Physical Exam Vitals reviewed.  Constitutional:      General: He is awake.     Appearance: Normal appearance. He is well-developed. He is not ill-appearing.     Comments: Very pleasant male appears stated age in no acute distress  HENT:     Head: Normocephalic and atraumatic.     Mouth/Throat:     Pharynx: No oropharyngeal exudate, posterior oropharyngeal erythema or uvula swelling.  Cardiovascular:     Rate and Rhythm: Normal rate and regular rhythm.     Heart  sounds: Normal heart sounds, S1 normal and S2 normal. No murmur heard. Pulmonary:     Effort: Pulmonary effort is normal.     Breath sounds: Normal breath sounds. No stridor. No wheezing, rhonchi or rales.     Comments: Clear to auscultation bilaterally Abdominal:     General: Bowel sounds are normal.     Palpations: Abdomen is soft.     Tenderness: There is no abdominal tenderness.  Musculoskeletal:     Right lower leg: 2+ Edema present.     Left lower leg: 2+ Edema present.  Neurological:     Mental Status: He is alert.  Psychiatric:        Behavior: Behavior is cooperative.      UC Treatments / Results  Labs (all labs ordered are listed, but only abnormal results are displayed) Labs Reviewed  CBC  COMPREHENSIVE METABOLIC PANEL  TSH  BRAIN NATRIURETIC PEPTIDE  POCT URINALYSIS DIPSTICK, ED / UC    EKG   Radiology No results found.  Procedures Procedures (including critical care time)  Medications Ordered in UC Medications - No data to display  Initial Impression / Assessment and Plan / UC Course  I have reviewed the triage vital signs and the nursing notes.  Pertinent labs & imaging results that were available during my care of the patient were reviewed by me and considered in my medical decision making (see chart for details).     Urine showed no significant proteinuria.  Patient is well-appearing, afebrile, nontoxic, nontachycardic with oxygen saturation of 97%.  Discussed that amlodipine could be contributing to his symptoms and recommended that he follow-up with his primary care to consider an alternative medication.  He is already prescribed hydrochlorothiazide so we will continue this medicine.  Recommended conservative treatment measures including limiting sodium and keeping his legs elevated.  Also recommended compression stockings to help manage  symptoms.  Discussed that if he has any worsening symptoms including shortness of breath, chest pain, worsening  swelling, increased pain he needs to be seen immediately.  Strict return precautions given to which she expressed understanding.  Recommended follow-up with primary care as scheduled on 03/22/2022 for further evaluation and management.  Final Clinical Impressions(s) / UC Diagnoses   Final diagnoses:  Leg swelling     Discharge Instructions      There is no protein in your urine.  I will contact you with your blood work results once I have them.  If they are significantly abnormal I will call you.  It is possible that amlodipine is contributing to your symptoms.  I do recommend that you contact your primary care to discuss potentially changing your medication regimen as we discussed.  Continue your hydrochlorothiazide.  Avoid sodium.  Keep your legs elevated.  Use compression stockings.  If anything worsens including shortness of breath, chest pain, worsening swelling, pain you need to be seen immediately.     ED Prescriptions   None    PDMP not reviewed this encounter.   Terrilee Croak, PA-C 03/19/22 1751

## 2022-03-22 ENCOUNTER — Encounter: Payer: Self-pay | Admitting: Nurse Practitioner

## 2022-03-22 ENCOUNTER — Ambulatory Visit
Admission: RE | Admit: 2022-03-22 | Discharge: 2022-03-22 | Disposition: A | Discharge status: Home / Self Care | Payer: Medicare Other | Source: Ambulatory Visit | Attending: Nurse Practitioner | Admitting: Nurse Practitioner

## 2022-03-22 ENCOUNTER — Ambulatory Visit (INDEPENDENT_AMBULATORY_CARE_PROVIDER_SITE_OTHER): Payer: Medicare Other | Admitting: Nurse Practitioner

## 2022-03-22 VITALS — BP 118/62 | HR 89 | Temp 97.4°F | Resp 12 | Ht 67.0 in | Wt 181.0 lb

## 2022-03-22 DIAGNOSIS — Z79899 Other long term (current) drug therapy: Secondary | ICD-10-CM | POA: Diagnosis not present

## 2022-03-22 DIAGNOSIS — D582 Other hemoglobinopathies: Secondary | ICD-10-CM | POA: Diagnosis not present

## 2022-03-22 DIAGNOSIS — R600 Localized edema: Secondary | ICD-10-CM

## 2022-03-22 DIAGNOSIS — I10 Essential (primary) hypertension: Secondary | ICD-10-CM | POA: Diagnosis not present

## 2022-03-22 LAB — CBC
HCT: 53.9 % — ABNORMAL HIGH (ref 39.0–52.0)
Hemoglobin: 17.5 g/dL — ABNORMAL HIGH (ref 13.0–17.0)
MCHC: 32.5 g/dL (ref 30.0–36.0)
MCV: 86.4 fl (ref 78.0–100.0)
Platelets: 144 10*3/uL — ABNORMAL LOW (ref 150.0–400.0)
RBC: 6.24 Mil/uL — ABNORMAL HIGH (ref 4.22–5.81)
RDW: 16.8 % — ABNORMAL HIGH (ref 11.5–15.5)
WBC: 7 10*3/uL (ref 4.0–10.5)

## 2022-03-22 NOTE — Patient Instructions (Signed)
Nice to see you today I will be in touch with the labs once I have them Follow up with me in December as planned, follow up sooner if needed Stop taking the nugenix

## 2022-03-22 NOTE — Assessment & Plan Note (Signed)
Noted on recent CBC hemoglobin of 18.1.  Patient is on testosterone replacement likely cause of polycythemia.  Given abnormal hemoglobin recheck labs today given patient symptoms rule out blood clot

## 2022-03-22 NOTE — Assessment & Plan Note (Signed)
Rule out DVTs as there is discrepancy and some tenderness on the right lower extremity.  Patient also is on testosterone.  At the time of this note ultrasounds have been resulted with negative DVT.  We will cut amlodipine from 10 mg to 5 mg check blood pressure daily and report if high levels if resolution great if not take patient off amlodipine altogether.  Continue trying to use compression stockings

## 2022-03-22 NOTE — Assessment & Plan Note (Signed)
Testosterone use

## 2022-03-22 NOTE — Assessment & Plan Note (Signed)
Blood pressure within normal limit patient still taking amlodipine 10 mg.  This is likely related to some of the lower extremity edema that he has been having.  Has increased as of late and with some calf tenderness pending [place] of lower extremities

## 2022-03-22 NOTE — Progress Notes (Signed)
Established Patient Office Visit  Subjective   Patient ID: James Pearson, male    DOB: 29-Nov-1945  Age: 76 y.o. MRN: [id number]  Chief Complaint  Patient presents with   Edema    Follow up on swelling of lower extremity. Better. Went to urgent care on 03/19/22 and was advised to stop Amlodipine but he has not.    HPI  UC Follow up: Was seen on 03/19/2022 at [facility] and was encouraged to stop amlodipine. States that they have went down some since then.  States he did get some compression socks and wear them today that did help with the lower extremity edema.  Patient has a hard time getting socks on and off per his report did inform patient not to wear these compression garments at night  HTN: states that he does have cuff at home. States that when he checks his blood pressure 116/68  Hypogonadism: Of note urgent care did check blood work patient's hemoglobin was 18.1 patient bilateral lower extremity swelling right greater than left and some discomfort to the posterior right leg.  Patient also on 400 mg of testosterone monthly and no history of DVT in the past no recent surgery no recent car, plane, train travel of 4+ hours.    Review of Systems  Constitutional:  Negative for chills and fever.  Respiratory:  Negative for shortness of breath.   Cardiovascular:  Positive for leg swelling. Negative for chest pain.  Neurological:  Negative for weakness.      Objective:     BP 118/62   Pulse 89   Temp (!) 97.4 F (36.3 C)   Resp 12   Ht '5\' 7"'$  (1.702 m)   Wt 181 lb (82.1 kg)   SpO2 97%   BMI 28.35 kg/m    Physical Exam Vitals and nursing note reviewed.  Constitutional:      Appearance: Normal appearance.  Cardiovascular:     Rate and Rhythm: Normal rate and regular rhythm.     Pulses: Normal pulses.     Heart sounds: Normal heart sounds.  Pulmonary:     Effort: Pulmonary effort is normal.     Breath sounds: Normal breath sounds.  Musculoskeletal:     Right lower leg:  Edema present.     Left lower leg: Edema present.     Comments: 36.0 cm left calf 37.5cm right calf  Some sensitivity to the right lower posterior calf on palpation   Neurological:     Mental Status: He is alert.      Results for orders placed or performed in visit on 03/22/22  CBC  Result Value Ref Range   WBC 7.0 4.0 - 10.5 K/uL   RBC 6.24 (H) 4.22 - 5.81 Mil/uL   Platelets 144.0 (L) 150.0 - 400.0 K/uL   Hemoglobin 17.5 (H) 13.0 - 17.0 g/dL   HCT 53.9 (H) 39.0 - 52.0 %   MCV 86.4 78.0 - 100.0 fl   MCHC 32.5 30.0 - 36.0 g/dL   RDW 16.8 (H) 11.5 - 15.5 %      The ASCVD Risk score (Arnett DK, et al., 2019) failed to calculate for the following reasons:   The patient has a prior MI or stroke diagnosis    Assessment & Plan:   Problem List Items Addressed This Visit       Cardiovascular and Mediastinum   Essential hypertension    Blood pressure within normal limit patient still taking amlodipine 10 mg.  This is likely  related to some of the lower extremity edema that he has been having.  Has increased as of late and with some calf tenderness pending [place] of lower extremities        Other   Bilateral edema of lower extremity - Primary    Rule out DVTs as there is discrepancy and some tenderness on the right lower extremity.  Patient also is on testosterone.  At the time of this note ultrasounds have been resulted with negative DVT.  We will cut amlodipine from 10 mg to 5 mg check blood pressure daily and report if high levels if resolution great if not take patient off amlodipine altogether.  Continue trying to use compression stockings      Relevant Orders   US Venous Img Lower Bilateral (Completed)   High risk medication use    Testosterone use      Relevant Orders   US Venous Img Lower Bilateral (Completed)   Elevated hemoglobin (HCC)    Noted on recent CBC hemoglobin of 18.1.  Patient is on testosterone replacement likely cause of polycythemia.  Given abnormal  hemoglobin recheck labs today given patient symptoms rule out blood clot      Relevant Orders   US Venous Img Lower Bilateral (Completed)   CBC (Completed)    Return in about 4 months (around 07/22/2022) for Recheck on DM.    Romilda Garret, NP

## 2022-03-23 ENCOUNTER — Encounter: Payer: Self-pay | Admitting: Rehabilitative and Restorative Service Providers"

## 2022-03-23 ENCOUNTER — Ambulatory Visit (INDEPENDENT_AMBULATORY_CARE_PROVIDER_SITE_OTHER): Payer: Medicare Other | Admitting: Rehabilitative and Restorative Service Providers"

## 2022-03-23 DIAGNOSIS — M6281 Muscle weakness (generalized): Secondary | ICD-10-CM | POA: Diagnosis not present

## 2022-03-23 DIAGNOSIS — M25512 Pain in left shoulder: Secondary | ICD-10-CM

## 2022-03-23 DIAGNOSIS — M25612 Stiffness of left shoulder, not elsewhere classified: Secondary | ICD-10-CM

## 2022-03-23 NOTE — Therapy (Signed)
OUTPATIENT PHYSICAL THERAPY TREATMENT NOTE   Patient Name: James Pearson MRN: [id number] DOB:[birth year], 76 y.o., male Today's Date: 03/23/2022  PCP: Michela Pitcher, MD REFERRING PROVIDER: Garald Balding, MD  END OF SESSION:   PT End of Session - 03/23/22 1616     Visit Number 2    Number of Visits 12    PT Start Time 4259    PT Stop Time 1600    PT Time Calculation (min) 45 min    Activity Tolerance Patient tolerated treatment well;No increased pain    Behavior During Therapy WFL for tasks assessed/performed             Past Medical History:  Diagnosis Date   Arthritis    GERD (gastroesophageal reflux disease)    Hepatitis    Hypertension    Parkinson's disease ([place])    Positive TB test    as a child   Stroke ([place]) 08/10/2011   Past Surgical History:  Procedure Laterality Date   CHOLECYSTECTOMY     HAND SURGERY     9 subsequent surgeries   rotator cuff surgery     Patient Active Problem List   Diagnosis Date Noted   High risk medication use 03/22/2022   Elevated hemoglobin ([place]) 03/22/2022   Former smoker 01/28/2022   Unexplained weight loss 01/28/2022   Bilateral edema of lower extremity 10/09/2021   Pain, dental 10/09/2021   Preventative health care 10/09/2021   Tremor 09/18/2021   Left hip pain 09/18/2021   Anxiety and depression 08/26/2021   Chronic pain of right knee 08/26/2021   Grief 08/26/2021   Rotator cuff tear arthropathy 06/23/2021   Psychosexual dysfunction with inhibited sexual excitement 06/23/2021   Obesity 06/23/2021   Benign neoplasm of colon 06/23/2021   Carpal tunnel syndrome 06/23/2021   History of adenomatous polyp of colon 06/23/2021   Brachial neuritis 06/23/2021   Parkinson's disease ([place]) 06/23/2021   Dysuria 03/18/2021   Low testosterone in male 03/18/2021   Hypokalemia 01/06/2021   Pain in left shoulder 11/05/2020   Bilateral primary osteoarthritis of knee 12/19/2019   Unilateral primary osteoarthritis, right  knee 10/30/2019   Insomnia 05/06/2015   Essential hypertension 05/06/2015   Chronic back pain 05/06/2015   Type 2 diabetes mellitus without complication ([place]) 56/38/7564   Stroke ([place]) 05/06/2015   HLD (hyperlipidemia) 05/06/2015   BPH (benign prostatic hyperplasia) 05/06/2015   Esophageal reflux 05/06/2015   History of TB (tuberculosis) 05/06/2015   Iron deficiency 04/10/2015    REFERRING DIAG: M25.512,G89.29 (ICD-10-CM) - Chronic left shoulder pain   THERAPY DIAG:  Stiffness of left shoulder, not elsewhere classified  Muscle weakness (generalized)  Left shoulder pain, unspecified chronicity  Rationale for Evaluation and Treatment Rehabilitation  PERTINENT HISTORY: OA hip and knees, hepatitis, HTN, Parkinson's, previous stroke, previous R RTC and multiple hand surgeries, type 2 DM  PRECAUTIONS: L RTC tear  SUBJECTIVE: James Pearson reports good early HEP compliance.  He had questions about his program which were answered today.  PAIN:  Are you having pain? Yes: NPRS scale: 2-6/10 Pain location: L shoulder Pain description: Ache, sore Aggravating factors: L shoulder use and sleeping Relieving factors: Change of position   OBJECTIVE: (objective measures completed at initial evaluation unless otherwise dated)  OBJECTIVE:    DIAGNOSTIC FINDINGS:  1. High-grade partial-thickness articular sided horizontal linear tear within the supraspinatus tendon insertion measuring up to 17 mm in AP dimension. No tendon retraction. 2. Mild-to-moderate degenerative changes of the acromioclavicular joint. Mild distal lateral  subacromial spurring. 3. Moderate thinning of the glenohumeral cartilage.   PATIENT SURVEYS:  FOTO 49 (Goal 61 in 11 visits)   COGNITION:           Overall cognitive status: Within functional limits for tasks assessed                                  SENSATION: No complaints of peripheral pain or paresthesias   POSTURE: Mild IR and protracted shoulders   UPPER  EXTREMITY ROM:    Passive ROM Right eval Left eval  Shoulder flexion 140 145  Shoulder extension      Shoulder abduction      Shoulder horizontal adduction 30 30  Shoulder internal rotation 50 55  Shoulder external rotation 70 65  Elbow flexion      Elbow extension      Wrist flexion      Wrist extension      Wrist ulnar deviation      Wrist radial deviation      Wrist pronation      Wrist supination      (Blank rows = not tested)   UPPER EXTREMITY Strength:   Hand-held dynamometer in pounds Right eval Left eval  Shoulder flexion      Shoulder extension      Shoulder abduction      Shoulder adduction      Shoulder internal rotation 56.6 42.7  Shoulder external rotation 33.1 28.7  Middle trapezius      Lower trapezius      Elbow flexion      Elbow extension      Wrist flexion      Wrist extension      Wrist ulnar deviation      Wrist radial deviation      Wrist pronation      Wrist supination      Grip strength (lbs)      (Blank rows = not tested)   TODAY'S TREATMENT:  03/23/2022 Therapeutic Exercises Shoulder blade pinches 10X 5 seconds Rows with Blue theraband 20X 3 seconds Supine scapular protraction with 5# 20X 3 seconds Supine IR/ER stretch 20X 10 seconds each Theraband ER shoulder Green 2 sets of 10 for 3 seconds slow eccentrics Theraband IR shoulder Green 10 for 3 seconds slow eccentrics  Functional Activities: Discussed anatomy of the shoulder and related it to his imaging and pathology; discussed using a pillow under the L elbow when sleeping on the R side to decrease L shoulder pain at night   03/12/2022 Therapeutic Exercises Shoulder blade pinches 5X 5 seconds Rows with Blue theraband 20X 3 seconds Supine scapular protraction with 5# 20X 3 seconds Supine IR/ER stretch 10X 10 seconds each   Functional Activities: Review shoulder model, imaging and discuss expectations with PT (IE non-surgical)     PATIENT EDUCATION: Education details: See  above Person educated: Patient Education method: Explanation, Demonstration, Tactile cues, Verbal cues, and Handouts Education comprehension: verbalized understanding, returned demonstration, verbal cues required, tactile cues required, and needs further education     HOME EXERCISE PROGRAM: Access Code: [email or web address] URL: [email or web address] Date: 03/23/2022 Prepared by: Vista Mink  Exercises - Standing Scapular Retraction  - 5 x daily - 7 x weekly - 1 sets - 5 reps - 5 second hold - Standing Row with Anchored Resistance Band with PLB  - 2 x daily - 7 x weekly - 1 sets - 20-30 reps -  3 hold - Supine Scapular Protraction in Flexion with Dumbbells  - 2 x daily - 7 x weekly - 1 sets - 20 reps - 3 seconds hold - Supine Shoulder External Rotation Stretch  - 2 x daily - 7 x weekly - 1 sets - 10-20 reps - 10 seconds hold - Supine Shoulder Internal Rotation Stretch  - 2 x daily - 7 x weekly - 1 sets - 10-20 reps - 10 seconds hold - Shoulder External Rotation with Anchored Resistance  - 1 x daily - 7 x weekly - 2 sets - 10 reps - 3 hold - Shoulder Internal Rotation with Resistance  - 1 x daily - 7 x weekly - 1 sets - 10 reps    ASSESSMENT:   CLINICAL IMPRESSION: James Pearson was very compliant with his early HEP.  He did require some correction to maintain 70-80 degrees of abduction with his supine IR/ER stretching on 2-3 pillows (elbow higher than shoulder).  We also added some RTC strengthening to complement his scapular strengthening and capsular stretching.  Continue emphasis on capsular stretching, scapular and RTC strength to meet LTGs.     OBJECTIVE IMPAIRMENTS decreased activity tolerance, decreased endurance, decreased knowledge of condition, decreased ROM, decreased strength, decreased safety awareness, increased edema, impaired perceived functional ability, impaired flexibility, impaired UE functional use, postural dysfunction, and pain.    ACTIVITY LIMITATIONS carrying, lifting,  sleeping, and reach over head   PARTICIPATION LIMITATIONS: community activity and the gym   PERSONAL FACTORS OA hip and knees, hepatitis, HTN, Parkinson's, previous stroke, previous R RTC and multiple hand surgeries, type 2 DM are also affecting patient's functional outcome.    REHAB POTENTIAL: Good   CLINICAL DECISION MAKING: Stable/uncomplicated   EVALUATION COMPLEXITY: Low     GOALS: Goals reviewed with patient? Yes   SHORT TERM GOALS: Target date: 04/09/2022  (Remove Blue Hyperlink)   Improve B shoulder AROM for flexion to 150; IR to 60; ER to 80 and horizontal adduction to 35 degrees Baseline: See objective Goal status: INITIAL   2.  Improve B shoulder strength as assessed by hand-held dynamometer Baseline: See objective Goal status: INITIAL   3.  James Pearson will be independent with his day 1 HEP Baseline: Started 03/12/2022 Goal status: On Going 03/23/2022   LONG TERM GOALS: Target date: 05/07/2022  (Remove Blue Hyperlink)   Improve FOTO to 61 Baseline: 49 Goal status: INITIAL   2.  Improve L shoulder pain to consistently 0-3/10 on the NPRS Baseline: 2-6/10 Goal status: On Going 03/23/2022   3.  Improve B shoulder AROM to 85% or graeter of flexion to 170; ER to 90; IR to 60 and horizontal adduction to 40 degrees Baseline: 145/140; 65/70; 55/50and 30/30 respectively Goal status: INITIAL   4.  Improve L shoulder strength to 30 pounds ER and 45 pounds IR Baseline: 28.7 and 42.7 Goal status: INITIAL   5.  James Pearson will be independent with his HEP at home and the gym at [place] Baseline: Started 03/12/2022 Goal status: INITIAL     PLAN: PT FREQUENCY: 1-2x/week   PT DURATION: 8 weeks   PLANNED INTERVENTIONS: Therapeutic exercises, Therapeutic activity, Neuromuscular re-education, Patient/Family education, Joint mobilization, Cryotherapy, and Manual therapy   PLAN FOR NEXT SESSION: Review current HEP, capsular stretching (limited in all directions) emphasis along with scapular and  RTC strengthening.    Farley Ly, PT 03/23/2022, 4:16 PM

## 2022-03-29 ENCOUNTER — Encounter: Payer: Self-pay | Admitting: Nurse Practitioner

## 2022-03-29 DIAGNOSIS — I10 Essential (primary) hypertension: Secondary | ICD-10-CM

## 2022-03-30 ENCOUNTER — Encounter: Payer: Self-pay | Admitting: Orthopaedic Surgery

## 2022-03-30 ENCOUNTER — Ambulatory Visit: Payer: Medicare Other | Admitting: Orthopaedic Surgery

## 2022-03-30 DIAGNOSIS — M170 Bilateral primary osteoarthritis of knee: Secondary | ICD-10-CM

## 2022-03-30 DIAGNOSIS — M1711 Unilateral primary osteoarthritis, right knee: Secondary | ICD-10-CM

## 2022-03-30 MED ORDER — BUPIVACAINE HCL 0.25 % IJ SOLN
2.0000 mL | INTRAMUSCULAR | Status: AC | PRN
  Administered 2022-03-30: 2 mL via INTRA_ARTICULAR

## 2022-03-30 MED ORDER — LIDOCAINE HCL 1 % IJ SOLN
2.0000 mL | INTRAMUSCULAR | Status: AC | PRN
  Administered 2022-03-30: 2 mL

## 2022-03-30 MED ORDER — METHYLPREDNISOLONE ACETATE 40 MG/ML IJ SUSP
80.0000 mg | INTRAMUSCULAR | Status: AC | PRN
  Administered 2022-03-30: 80 mg via INTRA_ARTICULAR

## 2022-03-30 NOTE — Telephone Encounter (Signed)
Spoke with patient. Has been taking Amlodipine 1/2 tablet since 03/23/22, this has improved the swelling but b/p readings are staying higher than they were especially in the morning. Sometimes during the day the readings are lower under 140/90 but for the most part staying higher.

## 2022-03-30 NOTE — Telephone Encounter (Signed)
If his blood pressure is higher and he is ok with the swelling he can go back to amlodipine '10mg'$ . He has been seen for the swelling many times and this is a complaint. If he wants to continue the amlodipine 5 mg since the swelling is better send in a script and have him follow up with me in 1-2 weeks to recheck BP and make adjustments with the other medication if needed.

## 2022-03-30 NOTE — Progress Notes (Signed)
Office Visit Note   Patient: James Pearson           Date of Birth: 1946/03/28           MRN: [id number] Visit Date: 03/30/2022              Requested by: No referring provider defined for this encounter. PCP: Michela Pitcher, NP   Assessment & Plan: Visit Diagnoses:  1. Bilateral primary osteoarthritis of knee     Plan: Patient is a pleasant 76 year old gentleman with a history of bilateral knee arthritis.  He has done well with injections into his knees before and finds them long-lasting.  Last injection was in May.  He is having return of bilateral knee pain no new injury.  He does have a history of diabetes with his last A1c in March being over 7.  We will go forward with injecting the right knee as it is more painful for him today follow-up in 2 weeks for injection of the left.  Discuss viscosupplementation on return  Follow-Up Instructions: Return in about 2 weeks (around 04/13/2022).   Orders:  Orders Placed This Encounter  Procedures   Large Joint Inj: R knee   No orders of the defined types were placed in this encounter.     Procedures: Large Joint Inj: R knee on 03/30/2022 1:58 PM Indications: pain and diagnostic evaluation Details: 25 G 1.5 in needle, anteromedial approach  Arthrogram: No  Medications: 80 mg methylPREDNISolone acetate 40 MG/ML; 2 mL lidocaine 1 %; 2 mL bupivacaine 0.25 % Outcome: tolerated well, no immediate complications Procedure, treatment alternatives, risks and benefits explained, specific risks discussed. Consent was given by the patient. Immediately prior to procedure a time out was called to verify the correct patient, procedure, equipment, support staff and site/side marked as required. Patient was prepped and draped in the usual sterile fashion.       Clinical Data: No additional findings.   Subjective: Chief Complaint  Patient presents with   Right Knee - Follow-up   Patient presents today for follow up of his right and left  knee. He states that last injection that was received on 12/22/2021 has been helping with his knee pain. He states that he has previously received an injection in his left knee and would like to know if another is able to be received. At this time he feels like his right knee is becoming worse then his left knee.  Review of Systems  All other systems reviewed and are negative.    Objective: Vital Signs: There were no vitals taken for this visit.  Physical Exam Constitutional:      Appearance: Normal appearance.  Pulmonary:     Effort: Pulmonary effort is normal.     Breath sounds: Normal breath sounds.  Skin:    General: Skin is warm and dry.  Neurological:     Mental Status: He is alert.     Ortho Exam Examination of his right knee he has no effusion no redness no erythema.  He does have grinding with range of motion.   Specialty Comments:  No specialty comments available.  Imaging: No results found.   PMFS History: Patient Active Problem List   Diagnosis Date Noted   High risk medication use 03/22/2022   Elevated hemoglobin ([place]) 03/22/2022   Former smoker 01/28/2022   Unexplained weight loss 01/28/2022   Bilateral edema of lower extremity 10/09/2021   Pain, dental 10/09/2021   Preventative health care 10/09/2021  Tremor 09/18/2021   Left hip pain 09/18/2021   Anxiety and depression 08/26/2021   Chronic pain of right knee 08/26/2021   Grief 08/26/2021   Rotator cuff tear arthropathy 06/23/2021   Psychosexual dysfunction with inhibited sexual excitement 06/23/2021   Obesity 06/23/2021   Benign neoplasm of colon 06/23/2021   Carpal tunnel syndrome 06/23/2021   History of adenomatous polyp of colon 06/23/2021   Brachial neuritis 06/23/2021   Parkinson's disease ([place]) 06/23/2021   Dysuria 03/18/2021   Low testosterone in male 03/18/2021   Hypokalemia 01/06/2021   Pain in left shoulder 11/05/2020   Bilateral primary osteoarthritis of knee 12/19/2019    Unilateral primary osteoarthritis, right knee 10/30/2019   Insomnia 05/06/2015   Essential hypertension 05/06/2015   Chronic back pain 05/06/2015   Type 2 diabetes mellitus without complication ([place]) 40/97/3532   Stroke ([place]) 05/06/2015   HLD (hyperlipidemia) 05/06/2015   BPH (benign prostatic hyperplasia) 05/06/2015   Esophageal reflux 05/06/2015   History of TB (tuberculosis) 05/06/2015   Iron deficiency 04/10/2015   Past Medical History:  Diagnosis Date   Arthritis    GERD (gastroesophageal reflux disease)    Hepatitis    Hypertension    Parkinson's disease ([place])    Positive TB test    as a child   Stroke ([place]) 08/10/2011    Family History  Problem Relation Age of Onset   Heart disease Mother    Hypertension Mother    Tremor Father    COPD Sister    Heart disease Sister    Hypertension Sister     Past Surgical History:  Procedure Laterality Date   CHOLECYSTECTOMY     HAND SURGERY     9 subsequent surgeries   rotator cuff surgery     Social History   Occupational History   Not on file  Tobacco Use   Smoking status: Former    Types: Cigarettes    Quit date: 08/09/1990    Years since quitting: 31.6   Smokeless tobacco: Never  Vaping Use   Vaping Use: Never used  Substance and Sexual Activity   Alcohol use: No    Alcohol/week: 0.0 standard drinks of alcohol   Drug use: No   Sexual activity: Yes

## 2022-03-31 ENCOUNTER — Encounter: Payer: Self-pay | Admitting: Physical Therapy

## 2022-03-31 ENCOUNTER — Ambulatory Visit (INDEPENDENT_AMBULATORY_CARE_PROVIDER_SITE_OTHER): Payer: Medicare Other | Admitting: Physical Therapy

## 2022-03-31 DIAGNOSIS — M25612 Stiffness of left shoulder, not elsewhere classified: Secondary | ICD-10-CM | POA: Diagnosis not present

## 2022-03-31 DIAGNOSIS — M6281 Muscle weakness (generalized): Secondary | ICD-10-CM | POA: Diagnosis not present

## 2022-03-31 DIAGNOSIS — M25512 Pain in left shoulder: Secondary | ICD-10-CM

## 2022-03-31 NOTE — Therapy (Signed)
OUTPATIENT PHYSICAL THERAPY TREATMENT NOTE   Patient Name: James Pearson MRN: [id number] DOB:1946-04-15, 76 y.o., male Today's Date: 03/31/2022  PCP: Michela Pitcher, MD REFERRING PROVIDER: Garald Balding, MD  END OF SESSION:   PT End of Session - 03/31/22 1357     Visit Number 3    Number of Visits 12    PT Start Time 1350    PT Stop Time 1430    PT Time Calculation (min) 40 min    Activity Tolerance Patient tolerated treatment well;No increased pain    Behavior During Therapy WFL for tasks assessed/performed             Past Medical History:  Diagnosis Date   Arthritis    GERD (gastroesophageal reflux disease)    Hepatitis    Hypertension    Parkinson's disease ([place])    Positive TB test    as a child   Stroke ([place]) 08/10/2011   Past Surgical History:  Procedure Laterality Date   CHOLECYSTECTOMY     HAND SURGERY     9 subsequent surgeries   rotator cuff surgery     Patient Active Problem List   Diagnosis Date Noted   High risk medication use 03/22/2022   Elevated hemoglobin ([place]) 03/22/2022   Former smoker 01/28/2022   Unexplained weight loss 01/28/2022   Bilateral edema of lower extremity 10/09/2021   Pain, dental 10/09/2021   Preventative health care 10/09/2021   Tremor 09/18/2021   Left hip pain 09/18/2021   Anxiety and depression 08/26/2021   Chronic pain of right knee 08/26/2021   Grief 08/26/2021   Rotator cuff tear arthropathy 06/23/2021   Psychosexual dysfunction with inhibited sexual excitement 06/23/2021   Obesity 06/23/2021   Benign neoplasm of colon 06/23/2021   Carpal tunnel syndrome 06/23/2021   History of adenomatous polyp of colon 06/23/2021   Brachial neuritis 06/23/2021   Parkinson's disease ([place]) 06/23/2021   Dysuria 03/18/2021   Low testosterone in male 03/18/2021   Hypokalemia 01/06/2021   Pain in left shoulder 11/05/2020   Bilateral primary osteoarthritis of knee 12/19/2019   Unilateral primary osteoarthritis, right  knee 10/30/2019   Insomnia 05/06/2015   Essential hypertension 05/06/2015   Chronic back pain 05/06/2015   Type 2 diabetes mellitus without complication ([place]) 29/79/8921   Stroke ([place]) 05/06/2015   HLD (hyperlipidemia) 05/06/2015   BPH (benign prostatic hyperplasia) 05/06/2015   Esophageal reflux 05/06/2015   History of TB (tuberculosis) 05/06/2015   Iron deficiency 04/10/2015    REFERRING DIAG: M25.512,G89.29 (ICD-10-CM) - Chronic left shoulder pain   THERAPY DIAG:  Stiffness of left shoulder, not elsewhere classified  Muscle weakness (generalized)  Left shoulder pain, unspecified chronicity  Rationale for Evaluation and Treatment Rehabilitation  PERTINENT HISTORY: OA hip and knees, hepatitis, HTN, Parkinson's, previous stroke, previous R RTC and multiple hand surgeries, type 2 DM  PRECAUTIONS: L RTC tear  SUBJECTIVE: [name] relays he has good days and bad days with shoulder pain, bothers him reaching out.   PAIN:  Are you having pain? Yes: NPRS scale: 2 currenlty/10 Pain location: L shoulder Pain description: Ache, sore Aggravating factors: L shoulder use and sleeping Relieving factors: Change of position   OBJECTIVE: (objective measures completed at initial evaluation unless otherwise dated)  OBJECTIVE:    DIAGNOSTIC FINDINGS:  1. High-grade partial-thickness articular sided horizontal linear tear within the supraspinatus tendon insertion measuring up to 17 mm in AP dimension. No tendon retraction. 2. Mild-to-moderate degenerative changes of the acromioclavicular joint. Mild distal  lateral subacromial spurring. 3. Moderate thinning of the glenohumeral cartilage.   PATIENT SURVEYS:  FOTO 49 (Goal 61 in 11 visits)   COGNITION:           Overall cognitive status: Within functional limits for tasks assessed                                  SENSATION: No complaints of peripheral pain or paresthesias   POSTURE: Mild IR and protracted shoulders   UPPER EXTREMITY  ROM:    Passive ROM Right eval Left eval  Shoulder flexion 140 145  Shoulder extension      Shoulder abduction      Shoulder horizontal adduction 30 30  Shoulder internal rotation 50 55  Shoulder external rotation 70 65  Elbow flexion      Elbow extension      Wrist flexion      Wrist extension      Wrist ulnar deviation      Wrist radial deviation      Wrist pronation      Wrist supination      (Blank rows = not tested)   UPPER EXTREMITY Strength:   Hand-held dynamometer in pounds Right eval Left eval  Shoulder flexion      Shoulder extension      Shoulder abduction      Shoulder adduction      Shoulder internal rotation 56.6 42.7  Shoulder external rotation 33.1 28.7  Middle trapezius      Lower trapezius      Elbow flexion      Elbow extension      Wrist flexion      Wrist extension      Wrist ulnar deviation      Wrist radial deviation      Wrist pronation      Wrist supination      Grip strength (lbs)      (Blank rows = not tested)   TODAY'S TREATMENT:  03/31/2022 Therapeutic Exercises Shoulder extensions green 20 X 3 sec Rows with Blue theraband 20X 3 seconds Theraband ER shoulder Green 2 sets of 10 for 3 seconds slow eccentrics Theraband IR shoulder Green 2 sets of 10 for 3 seconds slow eccentrics Supine scapular protraction with 5# 2X 10 for 3 seconds Supine IR/ER stretch 10X 10 seconds each Supine shoulder flexion AAROM 5 sec X10 Supine shoulder abd AAROM 5 sec X10  03/23/2022 Therapeutic Exercises Shoulder blade pinches 10X 5 seconds Rows with Blue theraband 20X 3 seconds Supine scapular protraction with 5# 20X 3 seconds Supine IR/ER stretch 20X 10 seconds each Theraband ER shoulder Green 2 sets of 10 for 3 seconds slow eccentrics Theraband IR shoulder Green 10 for 3 seconds slow eccentrics  Functional Activities: Discussed anatomy of the shoulder and related it to his imaging and pathology; discussed using a pillow under the L elbow when  sleeping on the R side to decrease L shoulder pain at night   03/12/2022 Therapeutic Exercises Shoulder blade pinches 5X 5 seconds Rows with Blue theraband 20X 3 seconds Supine scapular protraction with 5# 20X 3 seconds Supine IR/ER stretch 10X 10 seconds each   Functional Activities: Review shoulder model, imaging and discuss expectations with PT (IE non-surgical)     PATIENT EDUCATION: Education details: See above Person educated: Patient Education method: Explanation, Demonstration, Tactile cues, Verbal cues, and Handouts Education comprehension: verbalized understanding, returned demonstration, verbal  cues required, tactile cues required, and needs further education     HOME EXERCISE PROGRAM: Access Code: [email or web address] URL: [email or web address] Date: 03/23/2022 Prepared by: Vista Mink  Exercises - Standing Scapular Retraction  - 5 x daily - 7 x weekly - 1 sets - 5 reps - 5 second hold - Standing Row with Anchored Resistance Band with PLB  - 2 x daily - 7 x weekly - 1 sets - 20-30 reps - 3 hold - Supine Scapular Protraction in Flexion with Dumbbells  - 2 x daily - 7 x weekly - 1 sets - 20 reps - 3 seconds hold - Supine Shoulder External Rotation Stretch  - 2 x daily - 7 x weekly - 1 sets - 10-20 reps - 10 seconds hold - Supine Shoulder Internal Rotation Stretch  - 2 x daily - 7 x weekly - 1 sets - 10-20 reps - 10 seconds hold - Shoulder External Rotation with Anchored Resistance  - 1 x daily - 7 x weekly - 2 sets - 10 reps - 3 hold - Shoulder Internal Rotation with Resistance  - 1 x daily - 7 x weekly - 1 sets - 10 reps    ASSESSMENT:   CLINICAL IMPRESSION: He had good overall tolerance to session but still with some pain at end ROM abd and ER which is to be expected. I cautioned him about not pushing through too much pain and to keep exercises gentle. He will continue to benefit from PT.     OBJECTIVE IMPAIRMENTS decreased activity tolerance, decreased  endurance, decreased knowledge of condition, decreased ROM, decreased strength, decreased safety awareness, increased edema, impaired perceived functional ability, impaired flexibility, impaired UE functional use, postural dysfunction, and pain.    ACTIVITY LIMITATIONS carrying, lifting, sleeping, and reach over head   PARTICIPATION LIMITATIONS: community activity and the gym   PERSONAL FACTORS OA hip and knees, hepatitis, HTN, Parkinson's, previous stroke, previous R RTC and multiple hand surgeries, type 2 DM are also affecting patient's functional outcome.    REHAB POTENTIAL: Good   CLINICAL DECISION MAKING: Stable/uncomplicated   EVALUATION COMPLEXITY: Low     GOALS: Goals reviewed with patient? Yes   SHORT TERM GOALS: Target date: 04/09/2022  (Remove Blue Hyperlink)   Improve B shoulder AROM for flexion to 150; IR to 60; ER to 80 and horizontal adduction to 35 degrees Baseline: See objective Goal status: INITIAL   2.  Improve B shoulder strength as assessed by hand-held dynamometer Baseline: See objective Goal status: INITIAL   3.  [name] will be independent with his day 1 HEP Baseline: Started 03/12/2022 Goal status: On Going 03/23/2022   LONG TERM GOALS: Target date: 05/07/2022  (Remove Blue Hyperlink)   Improve FOTO to 61 Baseline: 49 Goal status: INITIAL   2.  Improve L shoulder pain to consistently 0-3/10 on the NPRS Baseline: 2-6/10 Goal status: On Going 03/23/2022   3.  Improve B shoulder AROM to 85% or graeter of flexion to 170; ER to 90; IR to 60 and horizontal adduction to 40 degrees Baseline: 145/140; 65/70; 55/50and 30/30 respectively Goal status: INITIAL   4.  Improve L shoulder strength to 30 pounds ER and 45 pounds IR Baseline: 28.7 and 42.7 Goal status: INITIAL   5.  [name] will be independent with his HEP at home and the gym at [place] Baseline: Started 03/12/2022 Goal status: INITIAL     PLAN: PT FREQUENCY: 1-2x/week   PT DURATION: 8 weeks   PLANNED  INTERVENTIONS: Therapeutic exercises,  Therapeutic activity, Neuromuscular re-education, Patient/Family education, Joint mobilization, Cryotherapy, and Manual therapy   PLAN FOR NEXT SESSION: Review current HEP, capsular stretching (limited in all directions) emphasis along with scapular and RTC strengthening.    Debbe Odea, PT,DPT 03/31/2022, 1:58 PM

## 2022-04-01 MED ORDER — AMLODIPINE BESYLATE 5 MG PO TABS: 5.0000 mg | ORAL_TABLET | Freq: Every day | ORAL | 0 refills | Status: DC

## 2022-04-01 NOTE — Addendum Note (Signed)
Addended by: Michela Pitcher on: 04/01/2022 01:14 PM   Modules accepted: Orders

## 2022-04-01 NOTE — Telephone Encounter (Signed)
I sent in the amlodipine 5 mg that he can start taking. STOP the amlodipine '10mg'$ . Check blood pressure daily and record it. Let us know if it is 140 or higher on the top or 90 or higher on the bottom consistently.   We can have him come in to fofice in 1-2 weeks for a  recheck if we are not having adequate control we will discuss changing on of his current blood pressure medication doses

## 2022-04-01 NOTE — Telephone Encounter (Signed)
Patient advised.

## 2022-04-06 ENCOUNTER — Encounter: Payer: Self-pay | Admitting: Nurse Practitioner

## 2022-04-06 DIAGNOSIS — I10 Essential (primary) hypertension: Secondary | ICD-10-CM

## 2022-04-06 MED ORDER — ENALAPRIL MALEATE 20 MG PO TABS: ORAL_TABLET | ORAL | 0 refills | Status: DC

## 2022-04-06 NOTE — Telephone Encounter (Signed)
Patient notified as instructed by telephone and verbalized understanding. Patient stated that he has taken one Enalapril today and will go ahead and take a second one now. Advised patient that this message will be sent to Romilda Garret NP for him to review when he returns to the office.

## 2022-04-06 NOTE — Telephone Encounter (Signed)
Patient is on Enalapril 20 mg.

## 2022-04-06 NOTE — Telephone Encounter (Signed)
Spoke to patient by telephone and was advised that his blood pressure now is 156/78 pulse 79. Patient stated that he has a slight headache which is about a pain level #2. Patient stated that he is doing his bills now and ;you would think that his blood pressure would be higher working on bills. Patient stated that he is only taking one half of his Amlodipine. Patient denies any symptoms other than a slight headache. Patient was given ER precautions and he verbalized understanding.  Romilda Garret NP is out of the office this afternoon. Will send message to another provider to review since Catalina Antigua is out.

## 2022-04-06 NOTE — Telephone Encounter (Signed)
With recent readings, increase valsartan to 40 mg . He can take two of his 20 mg tablets for 40 mg tablets total.   Continue half tablet amlodipine.   However, if dizziness, unsteady gait, and or any other red flag signs such as increasing headache, blurry vision, slurred speech, numbness tingling in the extremities go to ER immediately as  pt with self history of stroke.

## 2022-04-07 NOTE — Telephone Encounter (Signed)
Agree with assessment. 

## 2022-04-08 ENCOUNTER — Encounter: Payer: Medicare Other | Admitting: Rehabilitative and Restorative Service Providers"

## 2022-04-08 ENCOUNTER — Ambulatory Visit (INDEPENDENT_AMBULATORY_CARE_PROVIDER_SITE_OTHER): Payer: Medicare Other | Admitting: Nurse Practitioner

## 2022-04-08 VITALS — BP 168/70 | HR 83 | Temp 98.6°F | Resp 16 | Ht 67.0 in | Wt 174.2 lb

## 2022-04-08 DIAGNOSIS — J0100 Acute maxillary sinusitis, unspecified: Secondary | ICD-10-CM | POA: Diagnosis not present

## 2022-04-08 DIAGNOSIS — I10 Essential (primary) hypertension: Secondary | ICD-10-CM

## 2022-04-08 DIAGNOSIS — R600 Localized edema: Secondary | ICD-10-CM | POA: Diagnosis not present

## 2022-04-08 MED ORDER — ENALAPRIL MALEATE 20 MG PO TABS: ORAL_TABLET | ORAL | 2 refills | Status: DC

## 2022-04-08 MED ORDER — AMOXICILLIN-POT CLAVULANATE 875-125 MG PO TABS: 1.0000 | ORAL_TABLET | Freq: Two times a day (BID) | ORAL | 0 refills | Status: DC

## 2022-04-08 NOTE — Assessment & Plan Note (Signed)
We did titrate down on amlodipine.  Patient went from amlodipine 10 mg amlodipine 5 mg.  Patient is lower extremity edema has resolved.  Patient having elevated blood pressures at home and with some lightheadedness/dizziness and headaches.  We will titrate his enalapril from 20 mg daily to 40 mg daily.  Patient will take 2 tablets of 20 mg daily.  Continue checking blood pressure at home follow-up in 2 weeks

## 2022-04-08 NOTE — Assessment & Plan Note (Signed)
Given clinical presentation patient's symptoms and age will like to treat for acute sinusitis.  Patient COVID test at home that was negative twice.  Start Augmentin 875-125 mg twice daily for 7 days.  Follow-up if no improvement

## 2022-04-08 NOTE — Patient Instructions (Signed)
Follow up with me in 2 weeks  Continue taking amlodipine '5mg'$  daily Continue taking two tablets of enalapril '20mg'$  ('40mg'$  total)  I sent in antibiotics to your pharmacy

## 2022-04-08 NOTE — Assessment & Plan Note (Signed)
Since titrating patient formally amlodipine 10 mg amlodipine 5 mg bilateral lower extremity edema has resolved.

## 2022-04-08 NOTE — Progress Notes (Signed)
Established Patient Office Visit  Subjective   Patient ID: James Pearson, male    DOB: 05/05/1946  Age: 76 y.o. MRN: [id number]  Chief Complaint  Patient presents with   Hypertension    Follow up, b/p readings have been high since going down to Amlodipine 5 mg. Swelling has improved on this dose.   post nasal drip    Sx started on 04/01/22- did have a sore throat at first also. A lot of thick post nasal drip, makes him choke due to sickness, runny nose. No fever. Covid test negative x 2 on 8/27 and 8/30    HPI  HTN: Patient has had several visit complaining of lower extremity edema. He was back off '10mg'$  amlodipine and placed on '5mg'$  with improvement in swelling. He has been having increase BP readings at home.  States he will check his blood pressure at home 2-3 times a day. He has been having some headaches and some light headedness and dizziness, this has proir to getting sick.   URI: States that last week he had a sore thorat on Thursday. States that he had it on Friday. States Saturday got worse. Has been around sick peole. 2 covid tests that werer negatie. Has tried dayquill and a night time pill that made him really sleep     Review of Systems  Constitutional:  Positive for chills. Negative for fever.  HENT:  Positive for sinus pain. Negative for ear discharge, ear pain and sore throat.   Respiratory:  Positive for cough (light yellow) and shortness of breath.   Cardiovascular:  Negative for leg swelling.  Neurological:  Positive for dizziness and headaches.      Objective:     BP (!) 168/70   Pulse 83   Temp 98.6 F (37 C)   Resp 16   Ht '5\' 7"'$  (1.702 m)   Wt 174 lb 4 oz (79 kg)   SpO2 97%   BMI 27.29 kg/m    Physical Exam Vitals and nursing note reviewed.  Constitutional:      Appearance: Normal appearance.  HENT:     Right Ear: Tympanic membrane, ear canal and external ear normal.     Left Ear: Tympanic membrane, ear canal and external ear normal.      Nose:     Right Sinus: Frontal sinus tenderness present. No maxillary sinus tenderness.     Left Sinus: Maxillary sinus tenderness and frontal sinus tenderness present.     Mouth/Throat:     Mouth: Mucous membranes are moist.     Pharynx: Posterior oropharyngeal erythema present.  Cardiovascular:     Rate and Rhythm: Normal rate and regular rhythm.     Heart sounds: Normal heart sounds.  Pulmonary:     Effort: Pulmonary effort is normal.     Breath sounds: Normal breath sounds.  Musculoskeletal:     Right lower leg: No edema.     Left lower leg: No edema.  Lymphadenopathy:     Cervical: No cervical adenopathy.  Neurological:     Mental Status: He is alert.      No results found for any visits on 04/08/22.    The ASCVD Risk score (Arnett DK, et al., 2019) failed to calculate for the following reasons:   The patient has a prior MI or stroke diagnosis    Assessment & Plan:   Problem List Items Addressed This Visit       Cardiovascular and Mediastinum   Essential hypertension  We did titrate down on amlodipine.  Patient went from amlodipine 10 mg amlodipine 5 mg.  Patient is lower extremity edema has resolved.  Patient having elevated blood pressures at home and with some lightheadedness/dizziness and headaches.  We will titrate his enalapril from 20 mg daily to 40 mg daily.  Patient will take 2 tablets of 20 mg daily.  Continue checking blood pressure at home follow-up in 2 weeks      Relevant Medications   enalapril (VASOTEC) 20 MG tablet     Respiratory   Acute non-recurrent maxillary sinusitis - Primary    Given clinical presentation patient's symptoms and age will like to treat for acute sinusitis.  Patient COVID test at home that was negative twice.  Start Augmentin 875-125 mg twice daily for 7 days.  Follow-up if no improvement      Relevant Medications   amoxicillin-clavulanate (AUGMENTIN) 875-125 MG tablet     Other   Bilateral edema of lower extremity     Since titrating patient formally amlodipine 10 mg amlodipine 5 mg bilateral lower extremity edema has resolved.       Return in about 2 weeks (around 04/22/2022) for Blood pressure recheck.    Romilda Garret, NP

## 2022-04-13 ENCOUNTER — Ambulatory Visit: Payer: Non-veteran care | Admitting: Orthopaedic Surgery

## 2022-04-14 ENCOUNTER — Encounter: Payer: Self-pay | Admitting: Nurse Practitioner

## 2022-04-15 ENCOUNTER — Ambulatory Visit (INDEPENDENT_AMBULATORY_CARE_PROVIDER_SITE_OTHER): Payer: No Typology Code available for payment source | Admitting: Rehabilitative and Restorative Service Providers"

## 2022-04-15 ENCOUNTER — Encounter: Payer: Self-pay | Admitting: Rehabilitative and Restorative Service Providers"

## 2022-04-15 DIAGNOSIS — M25612 Stiffness of left shoulder, not elsewhere classified: Secondary | ICD-10-CM

## 2022-04-15 DIAGNOSIS — M25512 Pain in left shoulder: Secondary | ICD-10-CM

## 2022-04-15 DIAGNOSIS — M6281 Muscle weakness (generalized): Secondary | ICD-10-CM

## 2022-04-15 NOTE — Therapy (Signed)
OUTPATIENT PHYSICAL THERAPY TREATMENT/PROGRESS NOTE   Patient Name: James Pearson MRN: [id number] DOB:13-Oct-1945, 76 y.o., male Today's Date: 04/15/2022  PCP: Michela Pitcher, MD REFERRING PROVIDER: Garald Balding, MD  Progress Note Reporting Period 03/12/2022 to 04/15/2022  See note below for Objective Data and Assessment of Progress/Goals.      END OF SESSION:   PT End of Session - 04/15/22 1345     Visit Number 4    Number of Visits 12    PT Start Time 3893    PT Stop Time 1429    PT Time Calculation (min) 44 min    Activity Tolerance Patient tolerated treatment well;No increased pain    Behavior During Therapy WFL for tasks assessed/performed              Past Medical History:  Diagnosis Date   Arthritis    GERD (gastroesophageal reflux disease)    Hepatitis    Hypertension    Parkinson's disease ([place])    Positive TB test    as a child   Stroke ([place]) 08/10/2011   Past Surgical History:  Procedure Laterality Date   CHOLECYSTECTOMY     HAND SURGERY     9 subsequent surgeries   rotator cuff surgery     Patient Active Problem List   Diagnosis Date Noted   Acute non-recurrent maxillary sinusitis 04/08/2022   High risk medication use 03/22/2022   Elevated hemoglobin ([place]) 03/22/2022   Former smoker 01/28/2022   Unexplained weight loss 01/28/2022   Bilateral edema of lower extremity 10/09/2021   Pain, dental 10/09/2021   Preventative health care 10/09/2021   Tremor 09/18/2021   Left hip pain 09/18/2021   Anxiety and depression 08/26/2021   Chronic pain of right knee 08/26/2021   Grief 08/26/2021   Rotator cuff tear arthropathy 06/23/2021   Psychosexual dysfunction with inhibited sexual excitement 06/23/2021   Obesity 06/23/2021   Benign neoplasm of colon 06/23/2021   Carpal tunnel syndrome 06/23/2021   History of adenomatous polyp of colon 06/23/2021   Brachial neuritis 06/23/2021   Parkinson's disease ([place]) 06/23/2021   Dysuria 03/18/2021   Low  testosterone in male 03/18/2021   Hypokalemia 01/06/2021   Pain in left shoulder 11/05/2020   Bilateral primary osteoarthritis of knee 12/19/2019   Unilateral primary osteoarthritis, right knee 10/30/2019   Insomnia 05/06/2015   Essential hypertension 05/06/2015   Chronic back pain 05/06/2015   Type 2 diabetes mellitus without complication ([place]) 73/42/8768   Stroke ([place]) 05/06/2015   HLD (hyperlipidemia) 05/06/2015   BPH (benign prostatic hyperplasia) 05/06/2015   Esophageal reflux 05/06/2015   History of TB (tuberculosis) 05/06/2015   Iron deficiency 04/10/2015    REFERRING DIAG: M25.512,G89.29 (ICD-10-CM) - Chronic left shoulder pain   THERAPY DIAG:  Stiffness of left shoulder, not elsewhere classified  Muscle weakness (generalized)  Left shoulder pain, unspecified chronicity  Rationale for Evaluation and Treatment Rehabilitation  PERTINENT HISTORY: OA hip and knees, hepatitis, HTN, Parkinson's, previous stroke, previous R RTC and multiple hand surgeries, type 2 DM  PRECAUTIONS: L RTC tear  SUBJECTIVE: James Pearson reports improvement in his L shoulder since starting PT.  Sleeping has also improved, although he sleeps with a pillow under his L shoulder.  PAIN:  Are you having pain? Yes: NPRS scale: 2-5/10 Pain location: L shoulder Pain description: Ache, sore Aggravating factors: L shoulder use and sleeping Relieving factors: Change of position   OBJECTIVE: (objective measures completed at initial evaluation unless otherwise dated)  OBJECTIVE:  DIAGNOSTIC FINDINGS:  1. High-grade partial-thickness articular sided horizontal linear tear within the supraspinatus tendon insertion measuring up to 17 mm in AP dimension. No tendon retraction. 2. Mild-to-moderate degenerative changes of the acromioclavicular joint. Mild distal lateral subacromial spurring. 3. Moderate thinning of the glenohumeral cartilage.   PATIENT SURVEYS:  FOTO 49 (Goal 61 in 11 visits)   COGNITION:            Overall cognitive status: Within functional limits for tasks assessed                                  SENSATION: No complaints of peripheral pain or paresthesias   POSTURE: Mild IR and protracted shoulders   UPPER EXTREMITY ROM:    Passive ROM Right eval Left eval Left/Right in degrees 04/15/2022  Shoulder flexion 140 145 145/150  Shoulder extension       Shoulder abduction       Shoulder horizontal adduction 30 30 30/30  Shoulder internal rotation 50 55 60/60  Shoulder external rotation 70 65 70/75  Elbow flexion       Elbow extension       Wrist flexion       Wrist extension       Wrist ulnar deviation       Wrist radial deviation       Wrist pronation       Wrist supination       (Blank rows = not tested)   UPPER EXTREMITY Strength:   Hand-held dynamometer in pounds Right eval Left eval Left in pounds 04/15/2022  Shoulder flexion       Shoulder extension       Shoulder abduction       Shoulder adduction       Shoulder internal rotation 56.6 42.7 53.4  Shoulder external rotation 33.1 28.7 27.2  Middle trapezius       Lower trapezius       Elbow flexion       Elbow extension       Wrist flexion       Wrist extension       Wrist ulnar deviation       Wrist radial deviation       Wrist pronation       Wrist supination       Grip strength (lbs)       (Blank rows = not tested)   TODAY'S TREATMENT:  04/15/2022 Therapeutic Exercises Shoulder blade pinches 10X 5 seconds *Rows with Blue theraband 20X 3 seconds Supine scapular protraction with 5# 20X 3 seconds Supine IR/ER stretch 20X 10 seconds each Supine shoulder flexion AROM (palm in, reach up 1st) 10X 10 seconds Theraband ER shoulder Green 2 sets of 10 for 3 seconds slow eccentrics   03/31/2022 Therapeutic Exercises Shoulder extensions green 20 X 3 sec Rows with Blue theraband 20X 3 seconds Theraband ER shoulder Green 2 sets of 10 for 3 seconds slow eccentrics Theraband IR shoulder Green 2 sets of  10 for 3 seconds slow eccentrics Supine scapular protraction with 5# 2X 10 for 3 seconds Supine IR/ER stretch 10X 10 seconds each Supine shoulder flexion AAROM 5 sec X10 Supine shoulder abd AAROM 5 sec X10   03/23/2022 Therapeutic Exercises Shoulder blade pinches 10X 5 seconds Rows with Blue theraband 20X 3 seconds Supine scapular protraction with 5# 20X 3 seconds Supine IR/ER stretch 20X 10 seconds each Theraband ER shoulder  Green 2 sets of 10 for 3 seconds slow eccentrics Theraband IR shoulder Green 10 for 3 seconds slow eccentrics  Functional Activities: Discussed anatomy of the shoulder and related it to his imaging and pathology; discussed using a pillow under the L elbow when sleeping on the R side to decrease L shoulder pain at night     PATIENT EDUCATION: Education details: See above Person educated: Patient Education method: Explanation, Demonstration, Tactile cues, Verbal cues, and Handouts Education comprehension: verbalized understanding, returned demonstration, verbal cues required, tactile cues required, and needs further education     HOME EXERCISE PROGRAM: Access Code: [email or web address] URL: [email or web address] Date: 04/15/2022 Prepared by: Vista Mink  Exercises - Standing Scapular Retraction  - 5 x daily - 7 x weekly - 1 sets - 5 reps - 5 second hold - Standing Row with Anchored Resistance Band with PLB  - 1 x daily - 1 x weekly - 1 sets - 20-30 reps - 3 hold - Supine Scapular Protraction in Flexion with Dumbbells  - 2 x daily - 7 x weekly - 1 sets - 20 reps - 3 seconds hold - Supine Shoulder External Rotation Stretch  - 2 x daily - 7 x weekly - 1 sets - 10-20 reps - 10 seconds hold - Supine Shoulder Internal Rotation Stretch  - 2 x daily - 7 x weekly - 1 sets - 10-20 reps - 10 seconds hold - Shoulder External Rotation with Anchored Resistance  - 1 x daily - 7 x weekly - 2 sets - 10 reps - 3 hold - Shoulder Internal Rotation with Resistance  - 1 x  daily - 1 x weekly - 1 sets - 10 reps - Supine Shoulder Flexion AAROM with Hands Clasped  - 2 x daily - 7 x weekly - 1 sets - 10-20 reps - 10 seconds hold   ASSESSMENT:   CLINICAL IMPRESSION: James Pearson is making early progress subjectively and functionally (see FOTO progress).  Objectively, he still has some capsular tightness and posterior RTC weakness that will benefit from continued work before transfer into independent PT.  His prognosis remains good to meet LTGs with the continued recommended course of rehabilitation.   OBJECTIVE IMPAIRMENTS decreased activity tolerance, decreased endurance, decreased knowledge of condition, decreased ROM, decreased strength, decreased safety awareness, increased edema, impaired perceived functional ability, impaired flexibility, impaired UE functional use, postural dysfunction, and pain.    ACTIVITY LIMITATIONS carrying, lifting, sleeping, and reach over head   PARTICIPATION LIMITATIONS: community activity and the gym   PERSONAL FACTORS OA hip and knees, hepatitis, HTN, Parkinson's, previous stroke, previous R RTC and multiple hand surgeries, type 2 DM are also affecting patient's functional outcome.    REHAB POTENTIAL: Good   CLINICAL DECISION MAKING: Stable/uncomplicated   EVALUATION COMPLEXITY: Low     GOALS: Goals reviewed with patient? Yes   SHORT TERM GOALS: Target date: 04/09/2022  (Remove Blue Hyperlink)   Improve B shoulder AROM for flexion to 150; IR to 60; ER to 80 and horizontal adduction to 35 degrees Baseline: See objective Goal status: Partially Met 04/15/2022   2.  Improve B shoulder strength as assessed by hand-held dynamometer Baseline: See objective Goal status: Met 04/15/2022   3.  James Pearson will be independent with his day 1 HEP Baseline: Started 03/12/2022 Goal status: Met 04/15/2022   LONG TERM GOALS: Target date: 05/07/2022  (Remove Blue Hyperlink)   Improve FOTO to 61 Baseline: 49 Goal status: Met (63) 04/15/2022   2.  Improve L  shoulder pain to consistently 0-3/10 on the NPRS Baseline: 2-6/10 Goal status: On Going 04/15/2022   3.  Improve B shoulder AROM to 85% or greater of flexion to 170; ER to 90; IR to 60 and horizontal adduction to 40 degrees Baseline: 145/140; 65/70; 55/50and 30/30 respectively Goal status: On Going 04/15/2022   4.  Improve L shoulder strength to 30 pounds ER and 45 pounds IR Baseline: 28.7 and 42.7 Goal status: Partially Met 04/15/2022   5.  James Pearson will be independent with his HEP at home and the gym at [place] Baseline: Started 03/12/2022 Goal status: On Going 04/15/2022     PLAN: PT FREQUENCY: 1-2x/week   PT DURATION: 4 weeks   PLANNED INTERVENTIONS: Therapeutic exercises, Therapeutic activity, Neuromuscular re-education, Patient/Family education, Joint mobilization, Cryotherapy, and Manual therapy   PLAN FOR NEXT SESSION: Review HEP, capsular stretching (limited in all directions) emphasis along with scapular and RTC strengthening.    Farley Ly, PT, MPT 04/15/2022, 4:27 PM

## 2022-04-19 ENCOUNTER — Telehealth: Payer: Self-pay | Admitting: Physician Assistant

## 2022-04-19 NOTE — Telephone Encounter (Signed)
Records 03/30/22-present faxed to [facility] [phone]

## 2022-04-21 ENCOUNTER — Encounter: Payer: Self-pay | Admitting: Rehabilitative and Restorative Service Providers"

## 2022-04-21 ENCOUNTER — Ambulatory Visit (INDEPENDENT_AMBULATORY_CARE_PROVIDER_SITE_OTHER): Payer: No Typology Code available for payment source | Admitting: Rehabilitative and Restorative Service Providers"

## 2022-04-21 DIAGNOSIS — M6281 Muscle weakness (generalized): Secondary | ICD-10-CM | POA: Diagnosis not present

## 2022-04-21 DIAGNOSIS — M25512 Pain in left shoulder: Secondary | ICD-10-CM | POA: Diagnosis not present

## 2022-04-21 DIAGNOSIS — M25612 Stiffness of left shoulder, not elsewhere classified: Secondary | ICD-10-CM | POA: Diagnosis not present

## 2022-04-21 NOTE — Therapy (Signed)
OUTPATIENT PHYSICAL THERAPY TREATMENT NOTE   Patient Name: James Pearson MRN: [id number] DOB:08/07/1946, 76 y.o., male Today's Date: 04/21/2022  PCP: James M Cable, MD REFERRING PROVIDER: Peter W Whitfield, MD   END OF SESSION:   PT End of Session - 04/21/22 1347     Visit Number 5    Number of Visits 12    PT Start Time 1346    PT Stop Time 1426    PT Time Calculation (min) 40 min    Activity Tolerance Patient tolerated treatment well;No increased pain    Behavior During Therapy WFL for tasks assessed/performed              Past Medical History:  Diagnosis Date   Arthritis    GERD (gastroesophageal reflux disease)    Hepatitis    Hypertension    Parkinson's disease ([facility])    Positive TB test    as a child   Stroke ([facility]) 08/10/2011   Past Surgical History:  Procedure Laterality Date   CHOLECYSTECTOMY     HAND SURGERY     9 subsequent surgeries   rotator cuff surgery     Patient Active Problem List   Diagnosis Date Noted   Acute non-recurrent maxillary sinusitis 04/08/2022   High risk medication use 03/22/2022   Elevated hemoglobin ([facility]) 03/22/2022   Former smoker 01/28/2022   Unexplained weight loss 01/28/2022   Bilateral edema of lower extremity 10/09/2021   Pain, dental 10/09/2021   Preventative health care 10/09/2021   Tremor 09/18/2021   Left hip pain 09/18/2021   Anxiety and depression 08/26/2021   Chronic pain of right knee 08/26/2021   Grief 08/26/2021   Rotator cuff tear arthropathy 06/23/2021   Psychosexual dysfunction with inhibited sexual excitement 06/23/2021   Obesity 06/23/2021   Benign neoplasm of colon 06/23/2021   Carpal tunnel syndrome 06/23/2021   History of adenomatous polyp of colon 06/23/2021   Brachial neuritis 06/23/2021   Parkinson's disease ([facility]) 06/23/2021   Dysuria 03/18/2021   Low testosterone in male 03/18/2021   Hypokalemia 01/06/2021   Pain in left shoulder 11/05/2020   Bilateral primary osteoarthritis of knee  12/19/2019   Unilateral primary osteoarthritis, right knee 10/30/2019   Insomnia 05/06/2015   Essential hypertension 05/06/2015   Chronic back pain 05/06/2015   Type 2 diabetes mellitus without complication ([facility]) 05/06/2015   Stroke ([facility]) 05/06/2015   HLD (hyperlipidemia) 05/06/2015   BPH (benign prostatic hyperplasia) 05/06/2015   Esophageal reflux 05/06/2015   History of TB (tuberculosis) 05/06/2015   Iron deficiency 04/10/2015    REFERRING DIAG: M25.512,G89.29 (ICD-10-CM) - Chronic left shoulder pain   THERAPY DIAG:  Stiffness of left shoulder, not elsewhere classified  Muscle weakness (generalized)  Left shoulder pain, unspecified chronicity  Rationale for Evaluation and Treatment Rehabilitation  PERTINENT HISTORY: OA hip and knees, hepatitis, HTN, Parkinson's, previous stroke, previous R RTC and multiple hand surgeries, type 2 DM  PRECAUTIONS: L RTC tear  SUBJECTIVE: Don reports he has not been sleeping well this week.  His shoulder hasn't necessarily the problem, but because of the lack of sleep, his shoulder has been uncomfortable with activity.  PAIN:  Are you having pain? Yes: NPRS scale: 0-8/10 Pain location: L shoulder Pain description: Little pain at rest but can get a short, brief painful jab Aggravating factors: L shoulder use and sleeping Relieving factors: Change of position   OBJECTIVE: (objective measures completed at initial evaluation unless otherwise dated)  OBJECTIVE:    DIAGNOSTIC FINDINGS:  1. High-grade   partial-thickness articular sided horizontal linear tear within the supraspinatus tendon insertion measuring up to 17 mm in AP dimension. No tendon retraction. 2. Mild-to-moderate degenerative changes of the acromioclavicular joint. Mild distal lateral subacromial spurring. 3. Moderate thinning of the glenohumeral cartilage.   PATIENT SURVEYS:  FOTO 49 (Goal 61 in 11 visits)   COGNITION:           Overall cognitive status: Within  functional limits for tasks assessed                                  SENSATION: No complaints of peripheral pain or paresthesias   POSTURE: Mild IR and protracted shoulders   UPPER EXTREMITY ROM:    Passive ROM Right eval Left eval Left/Right in degrees 04/15/2022  Shoulder flexion 140 145 145/150  Shoulder extension       Shoulder abduction       Shoulder horizontal adduction 30 30 30/30  Shoulder internal rotation 50 55 60/60  Shoulder external rotation 70 65 70/75  Elbow flexion       Elbow extension       Wrist flexion       Wrist extension       Wrist ulnar deviation       Wrist radial deviation       Wrist pronation       Wrist supination       (Blank rows = not tested)   UPPER EXTREMITY Strength:   Hand-held dynamometer in pounds Right eval Left eval Left in pounds 04/15/2022  Shoulder flexion       Shoulder extension       Shoulder abduction       Shoulder adduction       Shoulder internal rotation 56.6 42.7 53.4  Shoulder external rotation 33.1 28.7 27.2  Middle trapezius       Lower trapezius       Elbow flexion       Elbow extension       Wrist flexion       Wrist extension       Wrist ulnar deviation       Wrist radial deviation       Wrist pronation       Wrist supination       Grip strength (lbs)       (Blank rows = not tested)   TODAY'S TREATMENT:  04/21/2022 Therapeutic Exercises Shoulder blade pinches 10X 5 seconds Rows with Blue theraband 20X 3 seconds Supine scapular protraction with 6# 20X 3 seconds Supine IR/ER stretch 20X 10 seconds each Supine shoulder flexion AROM (palm in, reach up 1st) 10X 10 seconds Theraband ER shoulder Blue 2 sets of 10 for 3 seconds slow eccentrics   04/15/2022 Therapeutic Exercises Shoulder blade pinches 10X 5 seconds *Rows with Blue theraband 20X 3 seconds Supine scapular protraction with 5# 20X 3 seconds Supine IR/ER stretch 20X 10 seconds each Supine shoulder flexion AROM (palm in, reach up 1st) 10X 10  seconds Theraband ER shoulder Green 2 sets of 10 for 3 seconds slow eccentrics   03/31/2022 Therapeutic Exercises Shoulder extensions green 20 X 3 sec Rows with Blue theraband 20X 3 seconds Theraband ER shoulder Green 2 sets of 10 for 3 seconds slow eccentrics Theraband IR shoulder Green 2 sets of 10 for 3 seconds slow eccentrics Supine scapular protraction with 5# 2X 10 for 3 seconds Supine IR/ER stretch 10X   10 seconds each Supine shoulder flexion AAROM 5 sec X10 Supine shoulder abd AAROM 5 sec X10    PATIENT EDUCATION: Education details: See above Person educated: Patient Education method: Explanation, Demonstration, Tactile cues, Verbal cues, and Handouts Education comprehension: verbalized understanding, returned demonstration, verbal cues required, tactile cues required, and needs further education     HOME EXERCISE PROGRAM: Access Code: [email or web address] URL: [email or web address] Date: 04/15/2022 Prepared by: Robert Lovell  Exercises - Standing Scapular Retraction  - 5 x daily - 7 x weekly - 1 sets - 5 reps - 5 second hold - Standing Row with Anchored Resistance Band with PLB  - 1 x daily - 1 x weekly - 1 sets - 20-30 reps - 3 hold - Supine Scapular Protraction in Flexion with Dumbbells  - 2 x daily - 7 x weekly - 1 sets - 20 reps - 3 seconds hold - Supine Shoulder External Rotation Stretch  - 2 x daily - 7 x weekly - 1 sets - 10-20 reps - 10 seconds hold - Supine Shoulder Internal Rotation Stretch  - 2 x daily - 7 x weekly - 1 sets - 10-20 reps - 10 seconds hold - Shoulder External Rotation with Anchored Resistance  - 1 x daily - 7 x weekly - 2 sets - 10 reps - 3 hold - Shoulder Internal Rotation with Resistance  - 1 x daily - 1 x weekly - 1 sets - 10 reps - Supine Shoulder Flexion AAROM with Hands Clasped  - 2 x daily - 7 x weekly - 1 sets - 10-20 reps - 10 seconds hold   ASSESSMENT:   CLINICAL IMPRESSION: Don is feeling a bit more discomfort this week due to a  lack of sleep.  He still has some capsular tightness and posterior RTC weakness that is being addressed and will benefit from continued work before transfer into independent PT.  His prognosis remains good to meet LTGs.  Brief objective measure check next visit with modifications to his HEP as needed.   OBJECTIVE IMPAIRMENTS decreased activity tolerance, decreased endurance, decreased knowledge of condition, decreased ROM, decreased strength, decreased safety awareness, increased edema, impaired perceived functional ability, impaired flexibility, impaired UE functional use, postural dysfunction, and pain.    ACTIVITY LIMITATIONS carrying, lifting, sleeping, and reach over head   PARTICIPATION LIMITATIONS: community activity and the gym   PERSONAL FACTORS OA hip and knees, hepatitis, HTN, Parkinson's, previous stroke, previous R RTC and multiple hand surgeries, type 2 DM are also affecting patient's functional outcome.    REHAB POTENTIAL: Good   CLINICAL DECISION MAKING: Stable/uncomplicated   EVALUATION COMPLEXITY: Low     GOALS: Goals reviewed with patient? Yes   SHORT TERM GOALS: Target date: 04/09/2022  (Remove Blue Hyperlink)   Improve B shoulder AROM for flexion to 150; IR to 60; ER to 80 and horizontal adduction to 35 degrees Baseline: See objective Goal status: Partially Met 04/15/2022   2.  Improve B shoulder strength as assessed by hand-held dynamometer Baseline: See objective Goal status: Met 04/15/2022   3.  Don will be independent with his day 1 HEP Baseline: Started 03/12/2022 Goal status: Met 04/15/2022   LONG TERM GOALS: Target date: 05/07/2022  (Remove Blue Hyperlink)   Improve FOTO to 61 Baseline: 49 Goal status: Met (63) 04/15/2022   2.  Improve L shoulder pain to consistently 0-3/10 on the NPRS Baseline: 2-6/10 Goal status: On Going 04/21/2022   3.  Improve B shoulder AROM to 85% or greater of   flexion to 170; ER to 90; IR to 60 and horizontal adduction to 40  degrees Baseline: 145/140; 65/70; 55/50and 30/30 respectively Goal status: On Going 04/15/2022   4.  Improve L shoulder strength to 30 pounds ER and 45 pounds IR Baseline: 28.7 and 42.7 Goal status: Partially Met 04/15/2022   5.  Don will be independent with his HEP at home and the gym at [place] Baseline: Started 03/12/2022 Goal status: On Going 04/21/2022     PLAN: PT FREQUENCY: 1-2x/week   PT DURATION: 4 weeks   PLANNED INTERVENTIONS: Therapeutic exercises, Therapeutic activity, Neuromuscular re-education, Patient/Family education, Joint mobilization, Cryotherapy, and Manual therapy   PLAN FOR NEXT SESSION: Review HEP, capsular stretching (limited in all directions) emphasis along with scapular and RTC strengthening.  Brief objective measure check.  DC?    Rob W Lovell, PT, MPT 04/21/2022, 2:25 PM     

## 2022-04-22 ENCOUNTER — Ambulatory Visit (INDEPENDENT_AMBULATORY_CARE_PROVIDER_SITE_OTHER)
Admission: RE | Admit: 2022-04-22 | Discharge: 2022-04-22 | Disposition: A | Discharge status: Home / Self Care | Payer: No Typology Code available for payment source | Source: Ambulatory Visit | Attending: Nurse Practitioner | Admitting: Nurse Practitioner

## 2022-04-22 ENCOUNTER — Encounter: Payer: Self-pay | Admitting: Nurse Practitioner

## 2022-04-22 ENCOUNTER — Ambulatory Visit (INDEPENDENT_AMBULATORY_CARE_PROVIDER_SITE_OTHER): Payer: No Typology Code available for payment source | Admitting: Nurse Practitioner

## 2022-04-22 VITALS — BP 128/60 | HR 77 | Temp 97.1°F | Resp 14 | Ht 67.0 in | Wt 176.5 lb

## 2022-04-22 DIAGNOSIS — R600 Localized edema: Secondary | ICD-10-CM

## 2022-04-22 DIAGNOSIS — R0609 Other forms of dyspnea: Secondary | ICD-10-CM

## 2022-04-22 DIAGNOSIS — I10 Essential (primary) hypertension: Secondary | ICD-10-CM

## 2022-04-22 DIAGNOSIS — M47814 Spondylosis without myelopathy or radiculopathy, thoracic region: Secondary | ICD-10-CM | POA: Diagnosis not present

## 2022-04-22 NOTE — Progress Notes (Signed)
Established Patient Office Visit  Subjective   Patient ID: James Pearson, male    DOB: 07-01-1946  Age: 76 y.o. MRN: [id number]  Chief Complaint  Patient presents with   Hypertension    2 weeks Follow up-has some dizziness off and on still but its better. Readings at home have been higher in the 1 mornings- this morning was 149/84, and then a few minutes later was 136/80      Hypertension: Patient is following up in regards to blood pressure.He was seen by me many times in regards to his bilateral lower extremity edema.  Patient's been amlodipine 10 mg for extended period of time.  We did cut back amlodipine from 10 mg to 5 mg and edema significantly improved.  Patient was having a difficult time controlling his blood pressure after that.  We did titrate up his enalapril to 40 mg daily.  He continue taking amlodipine 5 mg and his hydrochlorothiazide 25 mg daily.  He is here for recheck   DOE: states that he is short of breath on exertion since he was sick approx 2-3- weeks ago.  States that he can walk to the care without shortness of breath.    Review of Systems  Constitutional:  Negative for chills and fever.  Respiratory:  Positive for shortness of breath.   Cardiovascular:  Negative for chest pain and leg swelling.  Neurological:  Negative for headaches.      Objective:     BP 128/60   Pulse 77   Temp (!) 97.1 F (36.2 C)   Resp 14   Ht '5\' 7"'$  (1.702 m)   Wt 176 lb 8 oz (80.1 kg)   SpO2 98%   BMI 27.64 kg/m  BP Readings from Last 3 Encounters:  04/22/22 128/60  04/08/22 (!) 168/70  03/22/22 118/62   Wt Readings from Last 3 Encounters:  04/22/22 176 lb 8 oz (80.1 kg)  04/08/22 174 lb 4 oz (79 kg)  03/22/22 181 lb (82.1 kg)      Physical Exam Vitals and nursing note reviewed.  Constitutional:      Appearance: Normal appearance.  Cardiovascular:     Rate and Rhythm: Normal rate.     Heart sounds: Normal heart sounds.     Comments: Trace edema bilateral  lower extremities  Pulmonary:     Effort: Pulmonary effort is normal.     Breath sounds: Normal breath sounds.  Musculoskeletal:     Right lower leg: Edema present.     Left lower leg: Edema present.  Neurological:     Mental Status: He is alert.      No results found for any visits on 04/22/22.    The ASCVD Risk score (Arnett DK, et al., 2019) failed to calculate for the following reasons:   The patient has a prior MI or stroke diagnosis    Assessment & Plan:   Problem List Items Addressed This Visit       Cardiovascular and Mediastinum   Essential hypertension    Plan patient's blood pressure within normal limits in office today.  EKG checking blood pressure at home if she wishes.  Continue take enalapril 40 mg, amlodipine 5 mg and hydrochlorothiazide 12.5 mg daily.        Other   Bilateral edema of lower extremity - Primary    Has improved since cutting dose of amlodipine from 10 mg to amlodipine 5 mg.      DOE (dyspnea on exertion)  States this has been since he was ill with a viral illness.  States he can walk from the office to the car and be okay but when he ascends stairs he gets short of breath.  Will obtain chest x-ray in office.  Pending results.      Relevant Orders   DG Chest 2 View    Return in about 6 months (around 10/21/2022) for CPe and labs.    Romilda Garret, NP

## 2022-04-22 NOTE — Assessment & Plan Note (Signed)
Has improved since cutting dose of amlodipine from 10 mg to amlodipine 5 mg.

## 2022-04-22 NOTE — Assessment & Plan Note (Signed)
Plan patient's blood pressure within normal limits in office today.  EKG checking blood pressure at home if she wishes.  Continue take enalapril 40 mg, amlodipine 5 mg and hydrochlorothiazide 12.5 mg daily.

## 2022-04-22 NOTE — Assessment & Plan Note (Signed)
States this has been since he was ill with a viral illness.  States he can walk from the office to the car and be okay but when he ascends stairs he gets short of breath.  Will obtain chest x-ray in office.  Pending results.

## 2022-04-22 NOTE — Patient Instructions (Signed)
Nice to see you today Follow up with me in 6 months for your physical and labs Follow up sooner if you need

## 2022-04-23 ENCOUNTER — Encounter: Payer: Self-pay | Admitting: Rehabilitative and Restorative Service Providers"

## 2022-04-23 ENCOUNTER — Ambulatory Visit (INDEPENDENT_AMBULATORY_CARE_PROVIDER_SITE_OTHER): Payer: Medicare Other | Admitting: Rehabilitative and Restorative Service Providers"

## 2022-04-23 DIAGNOSIS — M25612 Stiffness of left shoulder, not elsewhere classified: Secondary | ICD-10-CM

## 2022-04-23 DIAGNOSIS — M25512 Pain in left shoulder: Secondary | ICD-10-CM | POA: Diagnosis not present

## 2022-04-23 DIAGNOSIS — M6281 Muscle weakness (generalized): Secondary | ICD-10-CM | POA: Diagnosis not present

## 2022-04-23 NOTE — Therapy (Addendum)
OUTPATIENT PHYSICAL THERAPY TREATMENT/DISCHARGE NOTE   Patient Name: James Pearson MRN: [id number] DOB:1946/02/27, 76 y.o., male Today's Date: 04/23/2022  PCP: Michela Pitcher, MD REFERRING PROVIDER: Garald Balding, MD  PHYSICAL THERAPY DISCHARGE SUMMARY  Visits from Start of Care: 6  Current functional level related to goals / functional outcomes: See note   Remaining deficits: See note   Education / Equipment: HEP   Patient agrees to discharge. Patient goals were partially met. Patient is being discharged due to not returning since the last visit.    END OF SESSION:   PT End of Session - 04/23/22 1346     Visit Number 6    Number of Visits 12    PT Start Time 5883    PT Stop Time 1424    PT Time Calculation (min) 39 min    Activity Tolerance Patient tolerated treatment well;No increased pain    Behavior During Therapy WFL for tasks assessed/performed               Past Medical History:  Diagnosis Date   Arthritis    GERD (gastroesophageal reflux disease)    Hepatitis    Hypertension    Parkinson's disease ([place])    Positive TB test    as a child   Stroke ([place]) 08/10/2011   Past Surgical History:  Procedure Laterality Date   CHOLECYSTECTOMY     HAND SURGERY     9 subsequent surgeries   rotator cuff surgery     Patient Active Problem List   Diagnosis Date Noted   DOE (dyspnea on exertion) 04/22/2022   Acute non-recurrent maxillary sinusitis 04/08/2022   High risk medication use 03/22/2022   Elevated hemoglobin ([place]) 03/22/2022   Former smoker 01/28/2022   Unexplained weight loss 01/28/2022   Bilateral edema of lower extremity 10/09/2021   Pain, dental 10/09/2021   Preventative health care 10/09/2021   Tremor 09/18/2021   Left hip pain 09/18/2021   Anxiety and depression 08/26/2021   Chronic pain of right knee 08/26/2021   Grief 08/26/2021   Rotator cuff tear arthropathy 06/23/2021   Psychosexual dysfunction with inhibited sexual  excitement 06/23/2021   Obesity 06/23/2021   Benign neoplasm of colon 06/23/2021   Carpal tunnel syndrome 06/23/2021   History of adenomatous polyp of colon 06/23/2021   Brachial neuritis 06/23/2021   Parkinson's disease ([place]) 06/23/2021   Dysuria 03/18/2021   Low testosterone in male 03/18/2021   Hypokalemia 01/06/2021   Pain in left shoulder 11/05/2020   Bilateral primary osteoarthritis of knee 12/19/2019   Unilateral primary osteoarthritis, right knee 10/30/2019   Insomnia 05/06/2015   Essential hypertension 05/06/2015   Chronic back pain 05/06/2015   Type 2 diabetes mellitus without complication ([place]) 25/49/8264   Stroke ([place]) 05/06/2015   HLD (hyperlipidemia) 05/06/2015   BPH (benign prostatic hyperplasia) 05/06/2015   Esophageal reflux 05/06/2015   History of TB (tuberculosis) 05/06/2015   Iron deficiency 04/10/2015    REFERRING DIAG: M25.512,G89.29 (ICD-10-CM) - Chronic left shoulder pain   THERAPY DIAG:  Stiffness of left shoulder, not elsewhere classified  Muscle weakness (generalized)  Left shoulder pain, unspecified chronicity  Rationale for Evaluation and Treatment Rehabilitation  PERTINENT HISTORY: OA hip and knees, hepatitis, HTN, Parkinson's, previous stroke, previous R RTC and multiple hand surgeries, type 2 DM  PRECAUTIONS: L RTC tear  SUBJECTIVE: [name] reports more soreness the past 2 days.  He has been more aggressive with his HEP than recommended and corrections were made today.  PAIN:  Are you having pain? Yes: NPRS scale: 0-8/10 Pain location: L shoulder Pain description: Little pain at rest but can get a short, brief painful jab Aggravating factors: L shoulder use and sleeping Relieving factors: Change of position   OBJECTIVE: (objective measures completed at initial evaluation unless otherwise dated)  OBJECTIVE:    DIAGNOSTIC FINDINGS:  1. High-grade partial-thickness articular sided horizontal linear tear within the supraspinatus tendon  insertion measuring up to 17 mm in AP dimension. No tendon retraction. 2. Mild-to-moderate degenerative changes of the acromioclavicular joint. Mild distal lateral subacromial spurring. 3. Moderate thinning of the glenohumeral cartilage.   PATIENT SURVEYS:  FOTO 49 (Goal 61 in 11 visits)   COGNITION:           Overall cognitive status: Within functional limits for tasks assessed                                  SENSATION: No complaints of peripheral pain or paresthesias   POSTURE: Mild IR and protracted shoulders   UPPER EXTREMITY ROM:    Passive ROM Right eval Left eval Left/Right in degrees 04/15/2022 Left/Right in degrees 04/23/2022  Shoulder flexion 140 145 145/150 140/140  Shoulder extension        Shoulder abduction        Shoulder horizontal adduction 30 30 30/30 30/35  Shoulder internal rotation 50 55 60/60 70/50  Shoulder external rotation 70 65 70/75 65/85  Elbow flexion        Elbow extension        Wrist flexion        Wrist extension        Wrist ulnar deviation        Wrist radial deviation        Wrist pronation        Wrist supination        (Blank rows = not tested)   UPPER EXTREMITY Strength:   Hand-held dynamometer in pounds Right eval Left eval Left in pounds 04/15/2022  Shoulder flexion       Shoulder extension       Shoulder abduction       Shoulder adduction       Shoulder internal rotation 56.6 42.7 53.4  Shoulder external rotation 33.1 28.7 27.2  Middle trapezius       Lower trapezius       Elbow flexion       Elbow extension       Wrist flexion       Wrist extension       Wrist ulnar deviation       Wrist radial deviation       Wrist pronation       Wrist supination       Grip strength (lbs)       (Blank rows = not tested)   TODAY'S TREATMENT:  04/23/2022 Therapeutic Exercises Shoulder blade pinches 10X 5 seconds Rows with Blue theraband 20X 3 seconds Supine scapular protraction with 6# 20X 3 seconds Supine IR/ER stretch 20X 10  seconds each (needs correction and NO weights) Supine shoulder flexion AROM (palm in, reach up 1st) 10X 10 seconds Theraband ER shoulder Green 2 sets of 10 for 3 seconds slow eccentric Posterior capsule stretch 10X 10 seconds   04/21/2022 Therapeutic Exercises Shoulder blade pinches 10X 5 seconds Rows with Blue theraband 20X 3 seconds Supine scapular protraction with 6# 20X 3 seconds Supine IR/ER  stretch 20X 10 seconds each Supine shoulder flexion AROM (palm in, reach up 1st) 10X 10 seconds Theraband ER shoulder Blue 2 sets of 10 for 3 seconds slow eccentrics   04/15/2022 Therapeutic Exercises Shoulder blade pinches 10X 5 seconds *Rows with Blue theraband 20X 3 seconds Supine scapular protraction with 5# 20X 3 seconds Supine IR/ER stretch 20X 10 seconds each Supine shoulder flexion AROM (palm in, reach up 1st) 10X 10 seconds Theraband ER shoulder Green 2 sets of 10 for 3 seconds slow eccentrics    PATIENT EDUCATION: Education details: See above Person educated: Patient Education method: Consulting civil engineer, Demonstration, Corporate treasurer cues, Verbal cues, and Handouts Education comprehension: verbalized understanding, returned demonstration, verbal cues required, tactile cues required, and needs further education     HOME EXERCISE PROGRAM: Access Code: [email or web address] URL: [email or web address] Date: 04/23/2022 Prepared by: Vista Mink  Exercises - Standing Scapular Retraction  - 5 x daily - 7 x weekly - 1 sets - 5 reps - 5 second hold - Standing Row with Anchored Resistance Band with PLB  - 1 x daily - 1 x weekly - 1 sets - 20-30 reps - 3 hold - Supine Scapular Protraction in Flexion with Dumbbells  - 2 x daily - 7 x weekly - 1 sets - 20 reps - 3 seconds hold - Supine Shoulder External Rotation Stretch  - 2 x daily - 7 x weekly - 1 sets - 10-20 reps - 10 seconds hold - Supine Shoulder Internal Rotation Stretch  - 2 x daily - 7 x weekly - 1 sets - 10-20 reps - 10 seconds hold -  Shoulder External Rotation with Anchored Resistance  - 1 x daily - 7 x weekly - 2 sets - 10 reps - 3 hold - Shoulder Internal Rotation with Resistance  - 1 x daily - 1 x weekly - 1 sets - 10 reps - Supine Shoulder Flexion AAROM with Hands Clasped  - 2 x daily - 7 x weekly - 1 sets - 10-20 reps - 10 seconds hold - Standing Shoulder Posterior Capsule Stretch  - 2-3 x daily - 7 x weekly - 1 sets - 5 reps - 10 hold   ASSESSMENT:   CLINICAL IMPRESSION: [name] notes more pain the past 2 days.  After working with him, it seems he has been adding weights to his supine stretches (not recommended) and doing more with his theraband than is recommended.  I reminded him repetition and consistency will help [place] meet LTGs and the additional weight/resistance can only slow [place] down.  He is aware and will make the appropriate changes to his HEP.  Some AROM progress is noted although returning to the recommended HEP should facilitate progress.  We also added a posterior capsule stretch today to complement his HEP.  Continue capsular stretching and appropriate scapular and rotator cuff strengthening.   OBJECTIVE IMPAIRMENTS decreased activity tolerance, decreased endurance, decreased knowledge of condition, decreased ROM, decreased strength, decreased safety awareness, increased edema, impaired perceived functional ability, impaired flexibility, impaired UE functional use, postural dysfunction, and pain.    ACTIVITY LIMITATIONS carrying, lifting, sleeping, and reach over head   PARTICIPATION LIMITATIONS: community activity and the gym   PERSONAL FACTORS OA hip and knees, hepatitis, HTN, Parkinson's, previous stroke, previous R RTC and multiple hand surgeries, type 2 DM are also affecting patient's functional outcome.    REHAB POTENTIAL: Good   CLINICAL DECISION MAKING: Stable/uncomplicated   EVALUATION COMPLEXITY: Low     GOALS: Goals reviewed with patient? Yes  SHORT TERM GOALS: Target date: 04/09/2022  (Remove  Blue Hyperlink)   Improve B shoulder AROM for flexion to 150; IR to 60; ER to 80 and horizontal adduction to 35 degrees Baseline: See objective Goal status: Partially Met 04/23/2022   2.  Improve B shoulder strength as assessed by hand-held dynamometer Baseline: See objective Goal status: Met 04/15/2022   3.  [name] will be independent with his day 1 HEP Baseline: Started 03/12/2022 Goal status: Met 04/15/2022   LONG TERM GOALS: Target date: 05/07/2022  (Remove Blue Hyperlink)   Improve FOTO to 61 Baseline: 49 Goal status: Met (63) 04/15/2022   2.  Improve L shoulder pain to consistently 0-3/10 on the NPRS Baseline: 2-6/10 Goal status: On Going 04/23/2022   3.  Improve B shoulder AROM to 85% or greater of flexion to 170; ER to 90; IR to 60 and horizontal adduction to 40 degrees Baseline: 145/140; 65/70; 55/50and 30/30 respectively Goal status: On Going 04/23/2022   4.  Improve L shoulder strength to 30 pounds ER and 45 pounds IR Baseline: 28.7 and 42.7 Goal status: Partially Met 04/15/2022   5.  [name] will be independent with his HEP at home and the gym at [place] Baseline: Started 03/12/2022 Goal status: On Going 04/23/2022     PLAN: PT FREQUENCY: 1-2x/week   PT DURATION: 4 weeks   PLANNED INTERVENTIONS: Therapeutic exercises, Therapeutic activity, Neuromuscular re-education, Patient/Family education, Joint mobilization, Cryotherapy, and Manual therapy   PLAN FOR NEXT SESSION: Review HEP, capsular stretching (limited in all directions) emphasis along with scapular and RTC strengthening to reduce impingement and improve function.    Farley Ly, PT, MPT 04/23/2022, 4:17 PM

## 2022-04-26 ENCOUNTER — Other Ambulatory Visit: Payer: Self-pay | Admitting: Family

## 2022-04-26 ENCOUNTER — Telehealth: Payer: Self-pay

## 2022-04-26 DIAGNOSIS — Z87891 Personal history of nicotine dependence: Secondary | ICD-10-CM

## 2022-04-26 DIAGNOSIS — R911 Solitary pulmonary nodule: Secondary | ICD-10-CM | POA: Insufficient documentation

## 2022-04-26 DIAGNOSIS — R9389 Abnormal findings on diagnostic imaging of other specified body structures: Secondary | ICD-10-CM | POA: Insufficient documentation

## 2022-04-26 DIAGNOSIS — R0609 Other forms of dyspnea: Secondary | ICD-10-CM

## 2022-04-26 NOTE — Progress Notes (Signed)
Reviewed meds recent note and saw that patient had recently been sick about 3 weeks ago can we ask if he had any antibiotic?  There was some bronchial wall thickening possibly bronchitis.  There was a left nodular appearance on chest x-ray seen and they are recommending that we order a CT of the chest.  I am going to get this ordered as stat, already in the works.

## 2022-04-26 NOTE — Telephone Encounter (Signed)
Received call report on Chest xray result for the patient- report in the chart  IMPRESSION: 1. Mild bronchial wall thickening which can be seen in the setting of small airways disease/bronchitis. 2. Questioned nodular contour of the LEFT superior paramediastinal border. This is nonspecific and could reflect summation artifact. Given history of smoking, recommend further dedicated evaluation with chest CT without contrast.

## 2022-04-26 NOTE — Telephone Encounter (Signed)
Encounter open for the imaging result and currently reaching out to patient to see if we need to treat for possible bronchitis.  CT chest that is in the works.

## 2022-04-27 ENCOUNTER — Ambulatory Visit
Admission: RE | Admit: 2022-04-27 | Discharge: 2022-04-27 | Disposition: A | Discharge status: Home / Self Care | Payer: No Typology Code available for payment source | Source: Ambulatory Visit | Attending: Family | Admitting: Family

## 2022-04-27 DIAGNOSIS — R0609 Other forms of dyspnea: Secondary | ICD-10-CM

## 2022-04-27 DIAGNOSIS — R911 Solitary pulmonary nodule: Secondary | ICD-10-CM

## 2022-04-27 DIAGNOSIS — R9389 Abnormal findings on diagnostic imaging of other specified body structures: Secondary | ICD-10-CM

## 2022-04-27 DIAGNOSIS — Z87891 Personal history of nicotine dependence: Secondary | ICD-10-CM

## 2022-04-28 ENCOUNTER — Telehealth: Payer: Self-pay | Admitting: Nurse Practitioner

## 2022-04-28 ENCOUNTER — Encounter: Payer: Self-pay | Admitting: Orthopaedic Surgery

## 2022-04-28 ENCOUNTER — Ambulatory Visit (INDEPENDENT_AMBULATORY_CARE_PROVIDER_SITE_OTHER): Payer: Medicare Other | Admitting: Orthopaedic Surgery

## 2022-04-28 ENCOUNTER — Telehealth: Payer: Self-pay

## 2022-04-28 DIAGNOSIS — M170 Bilateral primary osteoarthritis of knee: Secondary | ICD-10-CM | POA: Diagnosis not present

## 2022-04-28 DIAGNOSIS — M25512 Pain in left shoulder: Secondary | ICD-10-CM | POA: Diagnosis not present

## 2022-04-28 DIAGNOSIS — G8929 Other chronic pain: Secondary | ICD-10-CM | POA: Diagnosis not present

## 2022-04-28 MED ORDER — BUPIVACAINE HCL 0.25 % IJ SOLN
2.0000 mL | INTRAMUSCULAR | Status: AC | PRN
  Administered 2022-04-28: 2 mL via INTRA_ARTICULAR

## 2022-04-28 MED ORDER — METHYLPREDNISOLONE ACETATE 40 MG/ML IJ SUSP
80.0000 mg | INTRAMUSCULAR | Status: AC | PRN
  Administered 2022-04-28: 80 mg via INTRA_ARTICULAR

## 2022-04-28 MED ORDER — LIDOCAINE HCL 2 % IJ SOLN
2.0000 mL | INTRAMUSCULAR | Status: AC | PRN
  Administered 2022-04-28: 2 mL

## 2022-04-28 NOTE — Progress Notes (Signed)
Office Visit Note   Patient: James Pearson           Date of Birth: [birth year]           MRN: [id number] Visit Date: 04/28/2022              Requested by: Michela Pitcher, NP [address] [place],  [place] [address] PCP: Michela Pitcher, NP   Assessment & Plan: Visit Diagnoses:  1. Bilateral primary osteoarthritis of knee   2. Chronic left shoulder pain     Plan: James Pearson has bilateral knee osteoarthritis.  I injected his right knee several weeks ago and he notes that it made a difference for a short period of time.  He does have significant degenerative change by plain film and even possibly an osteochondral lesion of the medial femoral condyle.  I think it may be worth trying a course of viscosupplementation.  He would like to proceed.  We will have it precertified.  In addition he still having some trouble with his left shoulder.  He did have some initial relief with a cortisone injection almost 2 months ago.  He has had an MRI scan revealing high-grade partial-thickness tearing of the supraspinatus and some moderate degenerative changes of the glenohumeral joint.  I think it is worth trying a subacromial cortisone injection as his symptoms today are more consistent with impingement rather than the arthritic glenohumeral joint. This patient is diagnosed with osteoarthritis of the knee(s).    Radiographs show evidence of joint space narrowing, osteophytes, subchondral sclerosis and/or subchondral cysts.  This patient has knee pain which interferes with functional and activities of daily living.    This patient has experienced inadequate response, adverse effects and/or intolerance with conservative treatments such as acetaminophen, NSAIDS, topical creams, physical therapy or regular exercise, knee bracing and/or weight loss.   This patient has experienced inadequate response or has a contraindication to intra articular steroid injections for at least 3 months.   This patient is  not scheduled to have a total knee replacement within 6 months of starting treatment with viscosupplementation.   Follow-Up Instructions: Return Pre-CERT viscosupplementation right knee.   Orders:  Orders Placed This Encounter  Procedures   Large Joint Inj: L subacromial bursa   No orders of the defined types were placed in this encounter.     Procedures: Large Joint Inj: L subacromial bursa on 04/28/2022 2:09 PM Indications: pain and diagnostic evaluation Details: 25 G 1.5 in needle, anterolateral approach  Arthrogram: No  Medications: 2 mL lidocaine 2 %; 80 mg methylPREDNISolone acetate 40 MG/ML; 2 mL bupivacaine 0.25 % Consent was given by the patient. Immediately prior to procedure a time out was called to verify the correct patient, procedure, equipment, support staff and site/side marked as required. Patient was prepped and draped in the usual sterile fashion.       Clinical Data: No additional findings.   Subjective: Chief Complaint  Patient presents with   Right Knee - Follow-up   Left Knee - Follow-up  Patient presents today for a one month follow up on his knees. He received a right knee cortisone injection on 03/30/2022. He states that his right knee is still hurting. He does not feel like he needs a left knee injection today.  Also mentions that he still having some trouble with certain motions of his left shoulder.  He had an MRI scan performed in July consistent with arthritis of the glenohumeral joint and some  partial tearing of the rotator cuff.  Did have a cortisone injection that provided some temporary relief in the glenohumeral joint HPI  Review of Systems   Objective: Vital Signs: There were no vitals taken for this visit.  Physical Exam Constitutional:      Appearance: He is well-developed.  Eyes:     Pupils: Pupils are equal, round, and reactive to light.  Pulmonary:     Effort: Pulmonary effort is normal.  Skin:    General: Skin is warm and  dry.  Neurological:     Mental Status: He is alert and oriented to person, place, and time.  Psychiatric:        Behavior: Behavior normal.     Ortho Exam awake alert and oriented x3.  Comfortable sitting.  Right knee was not hot red warm or swollen and actually a little better than it was before he had the injection but still uncomfortable along the medial and lateral joint.  There is some patella crepitation but no pain with patella compression.  Full extension of flexed over 100 degrees without instability. Left shoulder with positive impingement.  Good strength with internal/external rotation.  No signs crepitation.  No pain at the [facility] joint.  Biceps intact.  Excellent strength with internal/external rotation. Specialty Comments:  No specialty comments available.  Imaging: CT Chest Wo Contrast  Result Date: 04/27/2022 CLINICAL DATA:  Lung opacities EXAM: CT CHEST WITHOUT CONTRAST TECHNIQUE: Multidetector CT imaging of the chest was performed following the standard protocol without IV contrast. RADIATION DOSE REDUCTION: This exam was performed according to the departmental dose-optimization program which includes automated exposure control, adjustment of the mA and/or kV according to patient size and/or use of iterative reconstruction technique. COMPARISON:  Chest radiograph 04/22/2022 FINDINGS: Cardiovascular: Left upper mediastinal vessels account for the prominence on recent chest radiography. Mediastinum/Nodes: Calcified AP window, paratracheal, left hilar, and left infrahilar lymph nodes compatible with old granulomatous disease. No pathologic adenopathy. Lungs/Pleura: Minimal scarring or subsegmental atelectasis in the lingula and both lower lobes. Upper Abdomen: Cholecystectomy. Musculoskeletal: Mild midthoracic spondylosis. Today's exam includes the very top of the L2 vertebral body which appear sclerotic, possibly with coarsened trabecula. This is more striking on today's exam compared to  the appearance on the lumbar spine radiographs from 11/03/2020. IMPRESSION: 1. The left mediastinal border turns out to be due to normal vasculature, without underlying lesion in the region of concern on prior chest radiography. 2. The top half of the L2 vertebral body is unusually sclerotic, possibly with coarsened trabecula but overall nonspecific. Today's exam was a CT of the chest and was not optimized to assess the lumbar spine. The L2 vertebra appeared normal on the lumbar radiographs from about 18 months ago. This could be from Paget's disease of bone or an unusual hemangioma, but osteosclerotic metastatic disease would also be a differential diagnostic consideration. Consider MRI of the lumbar spine and correlate with any history prostate cancer or recent PSA levels. 3. Old granulomatous disease. Electronically Signed   By: Van Clines M.D.   On: 04/27/2022 16:32     PMFS History: Patient Active Problem List   Diagnosis Date Noted   Lung nodule seen on imaging study 04/26/2022   Lung nodule 04/26/2022   Abnormal chest x-ray 04/26/2022   DOE (dyspnea on exertion) 04/22/2022   Acute non-recurrent maxillary sinusitis 04/08/2022   High risk medication use 03/22/2022   Elevated hemoglobin ([place]) 03/22/2022   Former smoker 01/28/2022   Unexplained weight loss 01/28/2022  Bilateral edema of lower extremity 10/09/2021   Pain, dental 10/09/2021   Preventative health care 10/09/2021   Tremor 09/18/2021   Left hip pain 09/18/2021   Anxiety and depression 08/26/2021   Chronic pain of right knee 08/26/2021   Grief 08/26/2021   Rotator cuff tear arthropathy 06/23/2021   Psychosexual dysfunction with inhibited sexual excitement 06/23/2021   Obesity 06/23/2021   Benign neoplasm of colon 06/23/2021   Carpal tunnel syndrome 06/23/2021   History of adenomatous polyp of colon 06/23/2021   Brachial neuritis 06/23/2021   Parkinson's disease ([place]) 06/23/2021   Dysuria 03/18/2021   Low  testosterone in male 03/18/2021   Hypokalemia 01/06/2021   Pain in left shoulder 11/05/2020   Bilateral primary osteoarthritis of knee 12/19/2019   Unilateral primary osteoarthritis, right knee 10/30/2019   Insomnia 05/06/2015   Essential hypertension 05/06/2015   Chronic back pain 05/06/2015   Type 2 diabetes mellitus without complication ([place]) 38/05/1750   Stroke ([place]) 05/06/2015   HLD (hyperlipidemia) 05/06/2015   BPH (benign prostatic hyperplasia) 05/06/2015   Esophageal reflux 05/06/2015   History of TB (tuberculosis) 05/06/2015   Iron deficiency 04/10/2015   Past Medical History:  Diagnosis Date   Arthritis    GERD (gastroesophageal reflux disease)    Hepatitis    Hypertension    Parkinson's disease ([place])    Positive TB test    as a child   Stroke ([place]) 08/10/2011    Family History  Problem Relation Age of Onset   Heart disease Mother    Hypertension Mother    Tremor Father    COPD Sister    Heart disease Sister    Hypertension Sister     Past Surgical History:  Procedure Laterality Date   CHOLECYSTECTOMY     HAND SURGERY     9 subsequent surgeries   rotator cuff surgery     Social History   Occupational History   Not on file  Tobacco Use   Smoking status: Former    Types: Cigarettes    Quit date: 08/09/1990    Years since quitting: 31.7   Smokeless tobacco: Never  Vaping Use   Vaping Use: Never used  Substance and Sexual Activity   Alcohol use: No    Alcohol/week: 0.0 standard drinks of alcohol   Drug use: No   Sexual activity: Yes

## 2022-04-28 NOTE — Progress Notes (Signed)
Now that I have been able to review the CT if pt still only feeling 80% better still, and not 100% I am happy to send in another antbx. We could either send in doxycycline or zpack.

## 2022-04-28 NOTE — Telephone Encounter (Signed)
Please precert for right knee visco. Dr.Whitfield's patient. Thanks

## 2022-04-28 NOTE — Progress Notes (Signed)
Thankfully the lung lesion seen on the xray were determined to be mediastinal vessels that are unconcerning. However the radiologist did see an area of sclerosis in the lower spine which has increased in appearance since last lumbar spine xrays in 2022. They are suggesting an MRI. I can get this ordered, but does pt have any implanted devices such as pacemakers or defibs? And or rods/plates or screws (anything metal?)

## 2022-04-28 NOTE — Telephone Encounter (Signed)
Patient called in and stated that he seen Matt last Thursday and an antibiotic was suppose to be called in for him. He stated that the pharmacy hasn't received anything yet.Thank you!

## 2022-04-29 ENCOUNTER — Other Ambulatory Visit: Payer: Self-pay | Admitting: Family

## 2022-04-29 DIAGNOSIS — J40 Bronchitis, not specified as acute or chronic: Secondary | ICD-10-CM

## 2022-04-29 MED ORDER — DOXYCYCLINE HYCLATE 100 MG PO TABS: 100.0000 mg | ORAL_TABLET | Freq: Two times a day (BID) | ORAL | 0 refills | Status: AC

## 2022-04-29 NOTE — Telephone Encounter (Signed)
Amaya advised patient yesterday when he came by the office that medication was not sent in yet because provider already left for the day.

## 2022-04-29 NOTE — Telephone Encounter (Signed)
Left message letting patient know Doxycycline was sent in to the pharmacy

## 2022-05-07 NOTE — Telephone Encounter (Signed)
VOB submitted for Synvisc, right knee.

## 2022-05-10 ENCOUNTER — Encounter: Payer: Self-pay | Admitting: Nurse Practitioner

## 2022-05-11 ENCOUNTER — Encounter: Payer: Self-pay | Admitting: Nurse Practitioner

## 2022-05-11 ENCOUNTER — Ambulatory Visit (INDEPENDENT_AMBULATORY_CARE_PROVIDER_SITE_OTHER): Payer: No Typology Code available for payment source | Admitting: Nurse Practitioner

## 2022-05-11 VITALS — BP 124/78 | HR 86 | Temp 97.2°F | Ht 67.0 in | Wt 178.0 lb

## 2022-05-11 DIAGNOSIS — H5712 Ocular pain, left eye: Secondary | ICD-10-CM

## 2022-05-11 DIAGNOSIS — H5789 Other specified disorders of eye and adnexa: Secondary | ICD-10-CM | POA: Insufficient documentation

## 2022-05-11 DIAGNOSIS — Z760 Encounter for issue of repeat prescription: Secondary | ICD-10-CM

## 2022-05-11 DIAGNOSIS — Z23 Encounter for immunization: Secondary | ICD-10-CM

## 2022-05-11 MED ORDER — TRAZODONE HCL 50 MG PO TABS: 50.0000 mg | ORAL_TABLET | Freq: Every evening | ORAL | 3 refills | Status: DC | PRN

## 2022-05-11 NOTE — Assessment & Plan Note (Signed)
.    Concern for acute angle glaucoma.  Patient states he was told that he has glaucoma but unsure.  In the event of a red painful eye that is sensitive to light with some visual disturbances stat referral to ophthalmology for pressure testing and further evaluation.

## 2022-05-11 NOTE — Patient Instructions (Addendum)
Nice to see you today We are working on getting you to an eye doctor today given your symptoms Keep your appointment with me in December, sooner if you need me

## 2022-05-11 NOTE — Progress Notes (Signed)
Established Patient Office Visit  Subjective   Patient ID: James Pearson, male    DOB: 01-Mar-1946  Age: 76 y.o. MRN: [id number]  Chief Complaint  Patient presents with   Eye Drainage    Left eye Red and swelling x 3 days started getting better yesterday     HPI  Eye problem: sates that he woke up Sunday morning and felt funny. States that it was red swollen and had some discharge. States that he had a headache. States yesterday that it was red. States less red today.  States that it is still hurting it it is also sensitive to light Patient states he has been evaluated by a provider at the [place] but they are backed up.  They did set him up with G[facility]ane but he does not have an appointment till November 2023. 1 question patient states he was told by the [facility] eye care professional that he has glaucoma  Patient states has been using Lumasil eyedrops to help with the redness.  Does wear contacts. States that he has them in today. States that Saturday night he slept with his contacts in. Does have a little headache today.  Overall patient states is improved since onset of Sunday    Review of Systems  Constitutional:  Negative for chills and fever.  Eyes:  Positive for blurred vision, pain and redness. Negative for discharge.  Neurological:  Positive for headaches.      Objective:     BP 124/78   Pulse 86   Temp (!) 97.2 F (36.2 C) (Temporal)   Ht '5\' 7"'$  (1.702 m)   Wt 178 lb (80.7 kg)   SpO2 96%   BMI 27.88 kg/m    Physical Exam Vitals and nursing note reviewed.  Constitutional:      Appearance: Normal appearance.  Eyes:     General: Lids are normal.        Right eye: No discharge.        Left eye: No discharge.     Extraocular Movements: Extraocular movements intact.     Conjunctiva/sclera:     Left eye: Left conjunctiva is injected. No exudate or hemorrhage.    Comments: Wears contacts  Patient had palpable tenderness when compressing on left side right side  was nonpainful.  Not an appreciable pupil response to light  Cardiovascular:     Rate and Rhythm: Normal rate and regular rhythm.  Neurological:     Mental Status: He is alert.      No results found for any visits on 05/11/22.    The ASCVD Risk score (Arnett DK, et al., 2019) failed to calculate for the following reasons:   The patient has a prior MI or stroke diagnosis    Assessment & Plan:   Problem List Items Addressed This Visit       Other   Medication refill   Relevant Medications   traZODone (DESYREL) 50 MG tablet   Pain of left eye    .  Concern for acute angle glaucoma.  Patient states he was told that he has glaucoma but unsure.  In the event of a red painful eye that is sensitive to light with some visual disturbances stat referral to ophthalmology for pressure testing and further evaluation.      Relevant Orders   Ambulatory referral to Ophthalmology   Red eye - Primary    Left wrist with pain no itching and some discharge which sounds minimal.  Patient states he has  a history of glaucoma but I do not see any current medications to treat such.  Given painful, red eye we will have patient seen by ophthalmology urgently.  To rule out an acute angle glaucoma      Relevant Orders   Ambulatory referral to Ophthalmology   Other Visit Diagnoses     Need for influenza vaccination           Return if symptoms worsen or fail to improve, for As scheduled.    Romilda Garret, NP

## 2022-05-11 NOTE — Assessment & Plan Note (Signed)
Left wrist with pain no itching and some discharge which sounds minimal.  Patient states he has a history of glaucoma but I do not see any current medications to treat such.  Given painful, red eye we will have patient seen by ophthalmology urgently.  To rule out an acute angle glaucoma

## 2022-05-12 ENCOUNTER — Other Ambulatory Visit: Payer: Self-pay | Admitting: Nurse Practitioner

## 2022-05-12 DIAGNOSIS — N400 Enlarged prostate without lower urinary tract symptoms: Secondary | ICD-10-CM

## 2022-05-26 ENCOUNTER — Other Ambulatory Visit: Payer: Self-pay

## 2022-05-26 DIAGNOSIS — M1711 Unilateral primary osteoarthritis, right knee: Secondary | ICD-10-CM

## 2022-05-27 ENCOUNTER — Ambulatory Visit: Payer: Medicare Other | Admitting: Orthopaedic Surgery

## 2022-05-27 ENCOUNTER — Encounter: Payer: Self-pay | Admitting: Orthopaedic Surgery

## 2022-05-27 DIAGNOSIS — M1711 Unilateral primary osteoarthritis, right knee: Secondary | ICD-10-CM

## 2022-05-27 MED ORDER — HYLAN G-F 20 16 MG/2ML IX SOSY
16.0000 mg | PREFILLED_SYRINGE | INTRA_ARTICULAR | Status: AC | PRN
  Administered 2022-05-27: 16 mg via INTRA_ARTICULAR

## 2022-05-27 NOTE — Progress Notes (Addendum)
Office Visit Note   Patient: James Pearson           Date of Birth: July 27, 1946           MRN: [id number] Visit Date: 05/27/2022              Requested by: James Pitcher, NP [place],  [place] [address] PCP: James Pitcher, NP   Assessment & Plan: Visit Diagnoses:  1. Unilateral primary osteoarthritis, right knee     Plan: Mr. James Pearson is a pleasant gentleman with a history of osteoarthritis of his right knee.  He did have cortisone injections but they were not long-lasting.  Comes in for his first of 3 Synvisc injections he tolerated the injection well did discuss with him possible side effects and he should call us if he has any concerns.  Follow-Up Instructions: Return in about 1 week (around 06/03/2022).   Orders:  No orders of the defined types were placed in this encounter.  No orders of the defined types were placed in this encounter.     Procedures: Large Joint Inj: R knee on 05/27/2022 2:12 PM Indications: pain and diagnostic evaluation Details: 22 G 1.5 in needle, anteromedial approach  Arthrogram: No  Medications: 16 mg hylan 16 MG/2ML Outcome: tolerated well, no immediate complications Procedure, treatment alternatives, risks and benefits explained, specific risks discussed. Consent was given by the patient.       Clinical Data: No additional findings.   Subjective: Chief Complaint  Patient presents with   Right Knee - Follow-up    Synvisc  Patient presents today for the first Synvisc injection in his right knee.     Review of Systems  All other systems reviewed and are negative.    Objective: Vital Signs: There were no vitals taken for this visit.  Physical Exam Patient appears well and is sitting comfortably on the exam table Ortho Exam Right knee no effusion no redness no erythema.  No tenderness to palpation.  Distal CMS is intact compartments are soft Specialty Comments:  No specialty comments  available.  Imaging: No results found.   PMFS History: Patient Active Problem List   Diagnosis Date Noted   Medication refill 05/11/2022   Pain of left eye 05/11/2022   Red eye 05/11/2022   Lung nodule seen on imaging study 04/26/2022   Lung nodule 04/26/2022   Abnormal chest x-ray 04/26/2022   DOE (dyspnea on exertion) 04/22/2022   Acute non-recurrent maxillary sinusitis 04/08/2022   High risk medication use 03/22/2022   Elevated hemoglobin ([place]) 03/22/2022   Former smoker 01/28/2022   Unexplained weight loss 01/28/2022   Bilateral edema of lower extremity 10/09/2021   Pain, dental 10/09/2021   Preventative health care 10/09/2021   Tremor 09/18/2021   Left hip pain 09/18/2021   Anxiety and depression 08/26/2021   Chronic pain of right knee 08/26/2021   Grief 08/26/2021   Rotator cuff tear arthropathy 06/23/2021   Psychosexual dysfunction with inhibited sexual excitement 06/23/2021   Obesity 06/23/2021   Benign neoplasm of colon 06/23/2021   Carpal tunnel syndrome 06/23/2021   History of adenomatous polyp of colon 06/23/2021   Brachial neuritis 06/23/2021   Parkinson's disease 06/23/2021   Dysuria 03/18/2021   Low testosterone in male 03/18/2021   Hypokalemia 01/06/2021   Pain in left shoulder 11/05/2020   Bilateral primary osteoarthritis of knee 12/19/2019   Unilateral primary osteoarthritis, right knee 10/30/2019   Insomnia 05/06/2015   Essential hypertension  05/06/2015   Chronic back pain 05/06/2015   Type 2 diabetes mellitus without complication ([place]) 49/44/9675   Stroke ([place]) 05/06/2015   HLD (hyperlipidemia) 05/06/2015   BPH (benign prostatic hyperplasia) 05/06/2015   Esophageal reflux 05/06/2015   History of TB (tuberculosis) 05/06/2015   Iron deficiency 04/10/2015   Past Medical History:  Diagnosis Date   Arthritis    GERD (gastroesophageal reflux disease)    Hepatitis    Hypertension    Parkinson's disease    Positive TB test    as a child    Stroke ([place]) 08/10/2011    Family History  Problem Relation Age of Onset   Heart disease Mother    Hypertension Mother    Tremor Father    COPD Sister    Heart disease Sister    Hypertension Sister     Past Surgical History:  Procedure Laterality Date   CHOLECYSTECTOMY     HAND SURGERY     9 subsequent surgeries   rotator cuff surgery     Social History   Occupational History   Not on file  Tobacco Use   Smoking status: Former    Types: Cigarettes    Quit date: 08/09/1990    Years since quitting: 31.8   Smokeless tobacco: Never  Vaping Use   Vaping Use: Never used  Substance and Sexual Activity   Alcohol use: No    Alcohol/week: 0.0 standard drinks of alcohol   Drug use: No   Sexual activity: Yes

## 2022-06-03 ENCOUNTER — Ambulatory Visit: Payer: Medicare Other | Admitting: Orthopaedic Surgery

## 2022-06-03 ENCOUNTER — Encounter: Payer: Self-pay | Admitting: Orthopaedic Surgery

## 2022-06-03 DIAGNOSIS — G8929 Other chronic pain: Secondary | ICD-10-CM

## 2022-06-03 DIAGNOSIS — M1711 Unilateral primary osteoarthritis, right knee: Secondary | ICD-10-CM

## 2022-06-03 DIAGNOSIS — M25561 Pain in right knee: Secondary | ICD-10-CM

## 2022-06-03 MED ORDER — HYLAN G-F 20 16 MG/2ML IX SOSY
16.0000 mg | PREFILLED_SYRINGE | INTRA_ARTICULAR | Status: AC | PRN
  Administered 2022-06-03: 16 mg via INTRA_ARTICULAR

## 2022-06-03 NOTE — Progress Notes (Signed)
Office Visit Note   Patient: James Pearson           Date of Birth: May 04, 1946           MRN: [id number] Visit Date: 06/03/2022              Requested by: Michela Pitcher, NP [address] [place],  [place] [address] PCP: Michela Pitcher, NP   Assessment & Plan: Visit Diagnoses:  1. Chronic pain of right knee     Plan: James Pearson is here for her second Synvisc injection.  Has tolerated the previous without difficulty.  Follow-Up Instructions: Return in about 1 week (around 06/10/2022).   Orders:  No orders of the defined types were placed in this encounter.  No orders of the defined types were placed in this encounter.     Procedures: Large Joint Inj: R knee on 06/03/2022 1:58 PM Indications: pain and diagnostic evaluation Details: 25 G 1.5 in needle, anteromedial approach  Arthrogram: No  Medications: 16 mg hylan 16 MG/2ML Outcome: tolerated well, no immediate complications Procedure, treatment alternatives, risks and benefits explained, specific risks discussed. Consent was given by the patient.     Clinical Data: No additional findings.   Subjective: Chief Complaint  Patient presents with   Right Knee - Follow-up    Synvisc #2  Patient presents today for the second Synvisc injection into his right knee.     Review of Systems  All other systems reviewed and are negative.    Objective: Vital Signs: There were no vitals taken for this visit.  Physical Exam Constitutional:      Appearance: Normal appearance.  Pulmonary:     Effort: Pulmonary effort is normal.  Neurological:     Mental Status: He is alert.     Ortho Exam Right knee no effusion no redness mild soft tissue swelling compartments are soft and nontender Specialty Comments:  No specialty comments available.  Imaging: No results found.   PMFS History: Patient Active Problem List   Diagnosis Date Noted   Medication refill 05/11/2022   Pain of left eye 05/11/2022   Red  eye 05/11/2022   Lung nodule seen on imaging study 04/26/2022   Lung nodule 04/26/2022   Abnormal chest x-ray 04/26/2022   DOE (dyspnea on exertion) 04/22/2022   Acute non-recurrent maxillary sinusitis 04/08/2022   High risk medication use 03/22/2022   Elevated hemoglobin ([place]) 03/22/2022   Former smoker 01/28/2022   Unexplained weight loss 01/28/2022   Bilateral edema of lower extremity 10/09/2021   Pain, dental 10/09/2021   Preventative health care 10/09/2021   Tremor 09/18/2021   Left hip pain 09/18/2021   Anxiety and depression 08/26/2021   Chronic pain of right knee 08/26/2021   Grief 08/26/2021   Rotator cuff tear arthropathy 06/23/2021   Psychosexual dysfunction with inhibited sexual excitement 06/23/2021   Obesity 06/23/2021   Benign neoplasm of colon 06/23/2021   Carpal tunnel syndrome 06/23/2021   History of adenomatous polyp of colon 06/23/2021   Brachial neuritis 06/23/2021   Parkinson's disease 06/23/2021   Dysuria 03/18/2021   Low testosterone in male 03/18/2021   Hypokalemia 01/06/2021   Pain in left shoulder 11/05/2020   Bilateral primary osteoarthritis of knee 12/19/2019   Unilateral primary osteoarthritis, right knee 10/30/2019   Insomnia 05/06/2015   Essential hypertension 05/06/2015   Chronic back pain 05/06/2015   Type 2 diabetes mellitus without complication ([place]) 23/55/7322   Stroke ([place]) 05/06/2015   HLD (hyperlipidemia)  05/06/2015   BPH (benign prostatic hyperplasia) 05/06/2015   Esophageal reflux 05/06/2015   History of TB (tuberculosis) 05/06/2015   Iron deficiency 04/10/2015   Past Medical History:  Diagnosis Date   Arthritis    GERD (gastroesophageal reflux disease)    Hepatitis    Hypertension    Parkinson's disease    Positive TB test    as a child   Stroke ([place]) 08/10/2011    Family History  Problem Relation Age of Onset   Heart disease Mother    Hypertension Mother    Tremor Father    COPD Sister    Heart disease Sister     Hypertension Sister     Past Surgical History:  Procedure Laterality Date   CHOLECYSTECTOMY     HAND SURGERY     9 subsequent surgeries   rotator cuff surgery     Social History   Occupational History   Not on file  Tobacco Use   Smoking status: Former    Types: Cigarettes    Quit date: 08/09/1990    Years since quitting: 31.8   Smokeless tobacco: Never  Vaping Use   Vaping Use: Never used  Substance and Sexual Activity   Alcohol use: No    Alcohol/week: 0.0 standard drinks of alcohol   Drug use: No   Sexual activity: Yes

## 2022-06-09 ENCOUNTER — Other Ambulatory Visit: Payer: Self-pay | Admitting: Nurse Practitioner

## 2022-06-09 DIAGNOSIS — I10 Essential (primary) hypertension: Secondary | ICD-10-CM

## 2022-06-10 ENCOUNTER — Encounter: Payer: Self-pay | Admitting: Orthopaedic Surgery

## 2022-06-10 ENCOUNTER — Ambulatory Visit: Payer: Medicare Other | Admitting: Orthopaedic Surgery

## 2022-06-10 DIAGNOSIS — M1711 Unilateral primary osteoarthritis, right knee: Secondary | ICD-10-CM | POA: Diagnosis not present

## 2022-06-10 MED ORDER — HYLAN G-F 20 16 MG/2ML IX SOSY
16.0000 mg | PREFILLED_SYRINGE | INTRA_ARTICULAR | Status: AC | PRN
  Administered 2022-06-10: 16 mg via INTRA_ARTICULAR

## 2022-06-10 NOTE — Progress Notes (Signed)
Office Visit Note   Patient: James Pearson           Date of Birth: 1945-11-01           MRN: [id number] Visit Date: 06/10/2022              Requested by: Michela Pitcher, NP [address],  [place] [address] PCP: Michela Pitcher, NP   Assessment & Plan: Visit Diagnoses:  1. Unilateral primary osteoarthritis, right knee     Plan: Mr. James Pearson comes into his today for his final injection of Synvisc into his right knee.  He is tolerated the previous quite well.  He does think he is getting a little bit better  Follow-Up Instructions: Return if symptoms worsen or fail to improve.   Orders:  No orders of the defined types were placed in this encounter.  No orders of the defined types were placed in this encounter.     Procedures: Large Joint Inj on 06/10/2022 1:09 PM Indications: pain and diagnostic evaluation Details: 25 G 1.5 in needle, anteromedial approach  Arthrogram: No  Medications: 16 mg hylan 16 MG/2ML Outcome: tolerated well, no immediate complications Procedure, treatment alternatives, risks and benefits explained, specific risks discussed. Consent was given by the patient.     Clinical Data: No additional findings.   Subjective: Chief Complaint  Patient presents with   Right Knee - Injections    HPI Patient comes in today for his third Synvisc injection into his right knee has tolerated the previous without difficulty Review of Systems  All other systems reviewed and are negative.   Objective: Vital Signs: There were no vitals taken for this visit.  Physical Exam Patient appears well sitting comfortably in exam chair awake alert and oriented Ortho Exam Right knee no effusion no erythema no warmth compartments of his lower leg are soft nontender Specialty Comments:  No specialty comments available.  Imaging: No results found.   PMFS History: Patient Active Problem List   Diagnosis Date Noted   Medication refill 05/11/2022    Pain of left eye 05/11/2022   Red eye 05/11/2022   Lung nodule seen on imaging study 04/26/2022   Lung nodule 04/26/2022   Abnormal chest x-ray 04/26/2022   DOE (dyspnea on exertion) 04/22/2022   Acute non-recurrent maxillary sinusitis 04/08/2022   High risk medication use 03/22/2022   Elevated hemoglobin ([place]) 03/22/2022   Former smoker 01/28/2022   Unexplained weight loss 01/28/2022   Bilateral edema of lower extremity 10/09/2021   Pain, dental 10/09/2021   Preventative health care 10/09/2021   Tremor 09/18/2021   Left hip pain 09/18/2021   Anxiety and depression 08/26/2021   Chronic pain of right knee 08/26/2021   Grief 08/26/2021   Rotator cuff tear arthropathy 06/23/2021   Psychosexual dysfunction with inhibited sexual excitement 06/23/2021   Obesity 06/23/2021   Benign neoplasm of colon 06/23/2021   Carpal tunnel syndrome 06/23/2021   History of adenomatous polyp of colon 06/23/2021   Brachial neuritis 06/23/2021   Parkinson's disease 06/23/2021   Dysuria 03/18/2021   Low testosterone in male 03/18/2021   Hypokalemia 01/06/2021   Pain in left shoulder 11/05/2020   Bilateral primary osteoarthritis of knee 12/19/2019   Unilateral primary osteoarthritis, right knee 10/30/2019   Insomnia 05/06/2015   Essential hypertension 05/06/2015   Chronic back pain 05/06/2015   Type 2 diabetes mellitus without complication ([place]) 49/70/2637   Stroke ([place]) 05/06/2015   HLD (hyperlipidemia) 05/06/2015   BPH (  benign prostatic hyperplasia) 05/06/2015   Esophageal reflux 05/06/2015   History of TB (tuberculosis) 05/06/2015   Iron deficiency 04/10/2015   Past Medical History:  Diagnosis Date   Arthritis    GERD (gastroesophageal reflux disease)    Hepatitis    Hypertension    Parkinson's disease    Positive TB test    as a child   Stroke ([place]) 08/10/2011    Family History  Problem Relation Age of Onset   Heart disease Mother    Hypertension Mother    Tremor Father    COPD  Sister    Heart disease Sister    Hypertension Sister     Past Surgical History:  Procedure Laterality Date   CHOLECYSTECTOMY     HAND SURGERY     9 subsequent surgeries   rotator cuff surgery     Social History   Occupational History   Not on file  Tobacco Use   Smoking status: Former    Types: Cigarettes    Quit date: 08/09/1990    Years since quitting: 31.8   Smokeless tobacco: Never  Vaping Use   Vaping Use: Never used  Substance and Sexual Activity   Alcohol use: No    Alcohol/week: 0.0 standard drinks of alcohol   Drug use: No   Sexual activity: Yes

## 2022-06-23 ENCOUNTER — Encounter (HOSPITAL_COMMUNITY): Payer: Self-pay

## 2022-06-23 ENCOUNTER — Ambulatory Visit (HOSPITAL_COMMUNITY)
Admission: EM | Admit: 2022-06-23 | Discharge: 2022-06-23 | Disposition: A | Discharge status: Home / Self Care | Payer: No Typology Code available for payment source | Attending: Emergency Medicine | Admitting: Emergency Medicine

## 2022-06-23 ENCOUNTER — Encounter: Payer: Self-pay | Admitting: Nurse Practitioner

## 2022-06-23 DIAGNOSIS — I10 Essential (primary) hypertension: Secondary | ICD-10-CM | POA: Diagnosis not present

## 2022-06-23 DIAGNOSIS — H16203 Unspecified keratoconjunctivitis, bilateral: Secondary | ICD-10-CM

## 2022-06-23 DIAGNOSIS — R519 Headache, unspecified: Secondary | ICD-10-CM

## 2022-06-23 NOTE — Telephone Encounter (Signed)
Noted. I see patient is being evaluated in UC setting

## 2022-06-23 NOTE — Discharge Instructions (Signed)
For your blood pressure -At this time I do not believe that you are having a more serious condition such as a heart attack or stroke -Your EKG in comparison to when completed in May shows no changes -Blood pressures currently 156/76, while elevated this is not emergent -Please notify your PCP that you have been evaluated and schedule a follow-up appointment for the next 1 to 2 weeks -When you are calm and in a quiet environment and record blood pressure only twice daily, once in the morning and once in the evening and take with you to your upcoming appointment -Today I do not want to make any adjustments to your medications as believe that your blood pressure is elevating as it has been changed twice within the last few months -Avoid fried foods and foods that are salty as this may worsen your symptoms -You may take over-the-counter Tylenol 500 mg every 6 hours as needed for management of your headache  For your eyes -I believe your conjunctivitis is flared which is irritation to the eye that can cause redness, blurred vision and drainage -Begin your TobraDex drops as prescribed this medicine is a mixture of an antibiotic and steroid and should improve your symptoms as you take it over the next week -Please keep upcoming appointment with eye doctor for reevaluation -You may continue use of refresh as needed  At any point if your symptoms worsen please follow-up with urgent care or the nearest emergency department for evaluation

## 2022-06-23 NOTE — Telephone Encounter (Signed)
I spoke with pt; pt said for 1 wk on and off has had blurred vision. Pt said difficult to drive at night due to blurred vision and H/A. Now H/A at pain level of 2 - 3 but earlier today pain level was 6. Pt is presently at [facility] walking; pt walks 3 mi everyday.pt said 06/22/22 BP was 177/79 and pt was having rt eye blurred vision and lt eye pain like H/A had settled in lt eye. This morning pt said rt eye was extremely blurred to the point could not read anything.  Pt is lightheaded also. Pt has no CP,SOB and no difficulty using extremities. Pt said earlier this morning BP 179/80 and at 1 PM BP 143/40 but pt still has h/a, lightheaded and blurred vision in rt eye. Pt taking amlodipine 5 mg daily, enalapril 20 mg two tabs daily and HCTZ 25 mg taking 1/2 tab daily. Pt said that [place] decreased his HCTZ due to depleting K.  Pt said would stress him more to go to ED but pt will go to [facility] on [facility].advised pt if he is lightheaded he should not drive; pt voiced understanding but would not confirm he will not drive. Sending note to Romilda Garret NP and [facility] [facility].

## 2022-06-23 NOTE — ED Triage Notes (Signed)
Pt c/o headache since they lowered his b/p meds. States his b/p 189/80 last night. States drank pomegranate juice and walked 3 miles today. C/o blurred vision to rt eye today.

## 2022-06-23 NOTE — Telephone Encounter (Signed)
Please triage

## 2022-06-23 NOTE — ED Provider Notes (Signed)
[facility]    CSN: [id number] Arrival date & time: 06/23/22  1506      History   Chief Complaint Chief Complaint  Patient presents with   Headache   Blurred Vision    HPI James Pearson is a 76 y.o. male.   Patient presents for evaluation of elevated blood pressure readings, intermittent headaches and blurred vision for 7 days.  Blood pressure peaking systolically in the 878M, diastolically in the 76H.  Has had accompanying left-sided headaches with associated dizziness and lightheadedness, denies syncope or loss of consciousness.  Headache is described as aching.  Has been having blurred vision to the right eye, light sensitivity worsened by driving and watching television and bilateral eye redness with drainage beginning this morning.  Denies changes in day-to-day activity and typically walks 3 miles at the [facility] in the morning, was able to complete exercises today.  Denies shortness of breath, wheezing, tachycardia or palpitations.  History of keratoconjunctivitis, reached out to ophthalmologist today who recommended beginning use of TobraDex, patient has not started medication yet, has attempted use of refresh eyedrops which have been minimally effective. Endorses he has been eating fried foods from baseline with 1 occurrence of fried chicken which he did remove the skin from and consumption of fried fish and shrimp 1 day ago.  Has been able to tolerate food and liquids today over the last 6 months blood pressure medication has been changed twice with most recent occurrence in August 2023, reduce amlodipine from 10 mg daily to 5 due to leg swelling and increase enalapril from 20 mg to 40 daily.  Endorses that his [place] doctor reduce his hydrochlorothiazide from 25 mg to 12.5 mg daily due to hypokalemia and discussed with patient that if medication ineffective at that dose that he would be changed to spironolactone.  Patient endorses that his doctor does not want him to take potassium  tablets but was unsure of reasoning.  Has been taking all daily medication as prescribed without missing doses.  Former smoker.  History of stroke, hypertension, Parkinson's disease, type 2 diabetes.  Past Medical History:  Diagnosis Date   Arthritis    GERD (gastroesophageal reflux disease)    Hepatitis    Hypertension    Parkinson's disease    Positive TB test    as a child   Stroke ([place]) 08/10/2011    Patient Active Problem List   Diagnosis Date Noted   Medication refill 05/11/2022   Pain of left eye 05/11/2022   Red eye 05/11/2022   Lung nodule seen on imaging study 04/26/2022   Lung nodule 04/26/2022   Abnormal chest x-ray 04/26/2022   DOE (dyspnea on exertion) 04/22/2022   Acute non-recurrent maxillary sinusitis 04/08/2022   High risk medication use 03/22/2022   Elevated hemoglobin ([place]) 03/22/2022   Former smoker 01/28/2022   Unexplained weight loss 01/28/2022   Bilateral edema of lower extremity 10/09/2021   Pain, dental 10/09/2021   Preventative health care 10/09/2021   Tremor 09/18/2021   Left hip pain 09/18/2021   Anxiety and depression 08/26/2021   Chronic pain of right knee 08/26/2021   Grief 08/26/2021   Rotator cuff tear arthropathy 06/23/2021   Psychosexual dysfunction with inhibited sexual excitement 06/23/2021   Obesity 06/23/2021   Benign neoplasm of colon 06/23/2021   Carpal tunnel syndrome 06/23/2021   History of adenomatous polyp of colon 06/23/2021   Brachial neuritis 06/23/2021   Parkinson's disease 06/23/2021   Dysuria 03/18/2021   Low testosterone in  male 03/18/2021   Hypokalemia 01/06/2021   Pain in left shoulder 11/05/2020   Bilateral primary osteoarthritis of knee 12/19/2019   Unilateral primary osteoarthritis, right knee 10/30/2019   Insomnia 05/06/2015   Essential hypertension 05/06/2015   Chronic back pain 05/06/2015   Type 2 diabetes mellitus without complication ([place]) 37/62/8315   Stroke ([place]) 05/06/2015   HLD (hyperlipidemia)  05/06/2015   BPH (benign prostatic hyperplasia) 05/06/2015   Esophageal reflux 05/06/2015   History of TB (tuberculosis) 05/06/2015   Iron deficiency 04/10/2015    Past Surgical History:  Procedure Laterality Date   CHOLECYSTECTOMY     HAND SURGERY     9 subsequent surgeries   rotator cuff surgery         Home Medications    Prior to Admission medications   Medication Sig Start Date End Date Taking? Authorizing Provider  amLODipine (NORVASC) 5 MG tablet Take 1 tablet (5 mg total) by mouth daily. 04/01/22   Michela Pitcher, NP  aspirin 81 MG chewable tablet Chew 81 mg by mouth daily.    [provider]  atorvastatin (LIPITOR) 80 MG tablet TAKE 1 TABLET BY MOUTH DAILY 03/15/22   Michela Pitcher, NP  CALCIUM-VITAMIN D PO Take by mouth. Calcium 600 mg and vitamin d 400 mg    [provider]  clobetasol (TEMOVATE) 0.05 % external solution Apply 1 application topically 2 (two) times daily. Apply to affected areas on scalp until itchy rash clears/ PRN 10/30/19   Brendolyn Patty, MD  cyclobenzaprine (FLEXERIL) 5 MG tablet Take 1 tablet (5 mg total) by mouth at bedtime as needed for muscle spasms. 11/03/20   Jearld Fenton, NP  enalapril (VASOTEC) 20 MG tablet TAKE 2 TABLETS BY MOUTH ONCE  DAILY 06/09/22   Michela Pitcher, NP  Fluocinolone Acetonide Body 0.01 % OIL Apply to scalp, face and ears QD to BID Patient taking differently: Apply 1 application  topically daily as needed (irritation). 10/30/19   Brendolyn Patty, MD  hydrochlorothiazide (HYDRODIURIL) 25 MG tablet TAKE 1 TABLET BY MOUTH DAILY Patient taking differently: Takes 1/2 tablet daily 01/14/22   Dutch Quint B, FNP  Iron, Ferrous Sulfate, 325 (65 Fe) MG TABS Take 325 mg by mouth daily. 10/22/21   Michela Pitcher, NP  ketoconazole (NIZORAL) 2 % shampoo APPLY THREE TIMES WEEKLY, LET SIT SEVERAL MINUTES BEFORE RINSING. Patient taking differently: Apply 1 application  topically as needed for irritation. 01/03/20   Brendolyn Patty,  MD  Oxycodone HCl 10 MG TABS Take by mouth every 4 (four) hours as needed for severe pain. Takes 5 to 10 mg at a time as needed    [provider]  sildenafil (VIAGRA) 100 MG tablet Take 50-100 mg by mouth daily as needed for erectile dysfunction.    [provider]  Specialty Vitamins Products (PROSTATE PO) Take 1 capsule by mouth at bedtime.    [provider]  tamsulosin (FLOMAX) 0.4 MG CAPS capsule TAKE 2 CAPSULES BY MOUTH DAILY 05/14/22   Michela Pitcher, NP  testosterone cypionate (DEPOTESTOSTERONE CYPIONATE) 200 MG/ML injection Inject 1 mL into the muscle every 14 (fourteen) days. 08/07/20   [provider]  tobramycin-dexamethasone [facility]) ophthalmic solution every 4 (four) hours while awake.    [provider]  traZODone (DESYREL) 50 MG tablet Take 1 tablet (50 mg total) by mouth at bedtime as needed. for sleep 05/11/22   Michela Pitcher, NP  TURMERIC PO Take 1 capsule by mouth daily.  [provider]    Family History Family History  Problem Relation Age of Onset   Heart disease Mother    Hypertension Mother    Tremor Father    COPD Sister    Heart disease Sister    Hypertension Sister     Social History Social History   Tobacco Use   Smoking status: Former    Types: Cigarettes    Quit date: 08/09/1990    Years since quitting: 31.8   Smokeless tobacco: Never  Vaping Use   Vaping Use: Never used  Substance Use Topics   Alcohol use: No    Alcohol/week: 0.0 standard drinks of alcohol   Drug use: No     Allergies   Omeprazole, Gabapentin, Ibuprofen, Ketorolac tromethamine, Meloxicam, Morphine, Nsaids, Oxybutynin chloride, and Propranolol   Review of Systems Review of Systems  Constitutional: Negative.   Eyes:  Positive for photophobia, pain, discharge, redness and visual disturbance.  Respiratory: Negative.    Cardiovascular: Negative.   Neurological:  Positive for headaches. Negative for dizziness, tremors,  seizures, syncope, facial asymmetry, speech difficulty, weakness, light-headedness and numbness.     Physical Exam Triage Vital Signs ED Triage Vitals  Enc Vitals Group     BP 06/23/22 1531 (!) 156/76     Pulse Rate 06/23/22 1531 82     Resp 06/23/22 1531 18     Temp 06/23/22 1531 98.2 F (36.8 C)     Temp Source 06/23/22 1531 Oral     SpO2 06/23/22 1531 98 %     Weight --      Height --      Head Circumference --      Peak Flow --      Pain Score 06/23/22 1532 3     Pain Loc --      Pain Edu? --      Excl. in [place]? --    No data found.  Updated Vital Signs BP (!) 156/76 (BP Location: Right Arm)   Pulse 82   Temp 98.2 F (36.8 C) (Oral)   Resp 18   SpO2 98%   Visual Acuity Right Eye Distance: 20/40 Left Eye Distance: 20/50 Bilateral Distance: 20/40  Right Eye Near:   Left Eye Near:    Bilateral Near:     Physical Exam Constitutional:      Appearance: Normal appearance.  Eyes:     Comments: Bilateral erythema to the conjunctiva and sclera, no drainage noted on exam, vision is grossly intact, extraocular movements intact  Cardiovascular:     Rate and Rhythm: Normal rate and regular rhythm.     Pulses: Normal pulses.     Heart sounds: Normal heart sounds.  Pulmonary:     Effort: Pulmonary effort is normal.     Breath sounds: Normal breath sounds.  Neurological:     General: No focal deficit present.     Mental Status: He is alert and oriented to person, place, and time. Mental status is at baseline.     Cranial Nerves: No cranial nerve deficit.     Motor: No weakness.     Gait: Gait normal.  Psychiatric:        Mood and Affect: Mood normal.        Behavior: Behavior normal.      UC Treatments / Results  Labs (all labs ordered are listed, but only abnormal results are displayed) Labs Reviewed - No data to display  EKG   Radiology No results found.  Procedures Procedures (including critical care time)  Medications Ordered in UC Medications -  No data to display  Initial Impression / Assessment and Plan / UC Course  I have reviewed the triage vital signs and the nursing notes.  Pertinent labs & imaging results that were available during my care of the patient were reviewed by me and considered in my medical decision making (see chart for details).  Elevated blood pressure reading with diagnosis of hypertension, bad headache, keratoconjunctivitis of both eyes  Blood pressure in triage 156/76, elevated but stable, nonemergent, patient is in no signs of distress nor toxic appearing, S1 and S2 heard to auscultation and lungs are clear in all lobes, EKG shows normal sinus rhythm with no change in comparison to be on 12/2020, no abnormalities to the neurological exam, stable at this time for outpatient management, as blood pressure is not elevated today in office will not make medication adjustments, recommended follow-up with PCP in 1 to 2 weeks, recommended twice daily blood pressure recordings to take to visit, advised avoidance of fried foods with low salt intake, given strict precautions at any point if symptoms worsen or new symptoms began he is to go to the nearest emergency department, at this time unsure if headache is related to eye symptoms or blood pressure, examination and presentation is consistent with conjunctivitis, as patient already has prescribed TobraDex, recommended beginning use for treatment, has follow-up appointment scheduled with ophthalmology, strongly advised to attend  Final Clinical Impressions(s) / UC Diagnoses   Final diagnoses:  None   Discharge Instructions   None    ED Prescriptions   None    PDMP not reviewed this encounter.   Hans Eden, NP 06/23/22 [id number]

## 2022-06-25 ENCOUNTER — Encounter: Payer: Self-pay | Admitting: Nurse Practitioner

## 2022-06-25 DIAGNOSIS — I10 Essential (primary) hypertension: Secondary | ICD-10-CM

## 2022-06-25 MED ORDER — HYDROCHLOROTHIAZIDE 12.5 MG PO TABS: 12.5000 mg | ORAL_TABLET | Freq: Every day | ORAL | 3 refills | Status: DC

## 2022-06-25 MED ORDER — AMLODIPINE BESYLATE 5 MG PO TABS: 5.0000 mg | ORAL_TABLET | Freq: Every day | ORAL | 3 refills | Status: DC

## 2022-06-25 NOTE — Telephone Encounter (Signed)
Please review request. I will make an appointment to follow up after. What time frame you would like to see patient in?  HCTZ in the chart is 25 mg take 1/2 tablet daily, need RX for 12.5 mg if appropriate to continue. Thank you

## 2022-06-25 NOTE — Telephone Encounter (Signed)
Need to see him within 7-10 days

## 2022-06-25 NOTE — Telephone Encounter (Signed)
Please schedule patient with Matt in 7 to 10 days to follow up

## 2022-06-25 NOTE — Telephone Encounter (Signed)
Patient has been scheduled

## 2022-06-27 ENCOUNTER — Other Ambulatory Visit: Payer: Self-pay | Admitting: Nurse Practitioner

## 2022-06-27 DIAGNOSIS — I10 Essential (primary) hypertension: Secondary | ICD-10-CM

## 2022-07-07 ENCOUNTER — Encounter: Payer: Self-pay | Admitting: *Deleted

## 2022-07-07 ENCOUNTER — Ambulatory Visit (INDEPENDENT_AMBULATORY_CARE_PROVIDER_SITE_OTHER): Payer: Medicare Other | Admitting: Nurse Practitioner

## 2022-07-07 VITALS — BP 132/64 | HR 76 | Temp 98.6°F | Resp 12 | Ht 67.0 in | Wt 181.0 lb

## 2022-07-07 DIAGNOSIS — R251 Tremor, unspecified: Secondary | ICD-10-CM

## 2022-07-07 DIAGNOSIS — I10 Essential (primary) hypertension: Secondary | ICD-10-CM | POA: Diagnosis not present

## 2022-07-07 DIAGNOSIS — R42 Dizziness and giddiness: Secondary | ICD-10-CM | POA: Diagnosis not present

## 2022-07-07 NOTE — Assessment & Plan Note (Signed)
Ambiguous in nature.  Do not think is related to hypertension or antihypertensives.  Slowly with position changes.  Patient's neurological exam entirely intact.  Ambulatory referral neurology for further workup

## 2022-07-07 NOTE — Assessment & Plan Note (Signed)
Patient's blood pressure within normal limits in office today.  He has been checking blood pressure at home and has been outside of normal limits.  Courage patient to bring in blood pressure cuff at either nurse visit or next office visit for [place] to compare.  Will not change any medication today

## 2022-07-07 NOTE — Progress Notes (Signed)
Established Patient Office Visit  Subjective   Patient ID: James Pearson, male    DOB: 1946/03/15  Age: 76 y.o. MRN: [id number]  Chief Complaint  Patient presents with   Hypertension    Follow up-b/p still elevated when checking at home, above 140s and 79/80 on the bottom      HTN: Patient was seen at the urgent care on 06/23/2022 for elevated blood pressure. An EKG was done, no medication changes were made. Patient is currenlty on enalapril '40mg'$ , amlodpidine '5mg'$ , hctz 12.'5mg'$ . States that he is checking his blood pressure daily at once a day.     Review of Systems  Constitutional:  Negative for chills and fever.  Respiratory:  Negative for shortness of breath.   Cardiovascular:  Negative for chest pain.  Neurological:  Positive for dizziness and headaches.      Objective:     BP 132/64   Pulse 76   Temp 98.6 F (37 C) (Oral)   Resp 12   Ht '5\' 7"'$  (1.702 m)   Wt 181 lb (82.1 kg)   SpO2 97%   BMI 28.35 kg/m  BP Readings from Last 3 Encounters:  07/07/22 132/64  06/23/22 (!) 156/76  05/11/22 124/78   Wt Readings from Last 3 Encounters:  07/07/22 181 lb (82.1 kg)  05/11/22 178 lb (80.7 kg)  04/22/22 176 lb 8 oz (80.1 kg)      Physical Exam Vitals and nursing note reviewed.  Constitutional:      Appearance: Normal appearance.  HENT:     Right Ear: Tympanic membrane, ear canal and external ear normal.     Left Ear: Tympanic membrane, ear canal and external ear normal.  Cardiovascular:     Rate and Rhythm: Normal rate and regular rhythm.     Heart sounds: Normal heart sounds.  Pulmonary:     Effort: Pulmonary effort is normal.     Breath sounds: Normal breath sounds.  Neurological:     General: No focal deficit present.     Mental Status: He is alert.     Deep Tendon Reflexes:     Reflex Scores:      Bicep reflexes are 1+ on the right side and 1+ on the left side.      Patellar reflexes are 1+ on the right side and 1+ on the left side.    Comments:  Bilateral upper and lower extremity strength 5/5      No results found for any visits on 07/07/22.    The ASCVD Risk score (Arnett DK, et al., 2019) failed to calculate for the following reasons:   The patient has a prior MI or stroke diagnosis    Assessment & Plan:   Problem List Items Addressed This Visit       Cardiovascular and Mediastinum   Essential hypertension    Patient's blood pressure within normal limits in office today.  He has been checking blood pressure at home and has been outside of normal limits.  Courage patient to bring in blood pressure cuff at either nurse visit or next office visit for [place] to compare.  Will not change any medication today        Other   Tremor    Patient brings up an intention tremor again.  Has been evaluated by neurology but would like a second opinion.  Patient also experiencing lightheadedness not secondary to hypertension or antihypertensive medications.  Ambulatory referral to neurology placed today  Relevant Orders   Ambulatory referral to Neurology   Lightheadedness - Primary    Ambiguous in nature.  Do not think is related to hypertension or antihypertensives.  Slowly with position changes.  Patient's neurological exam entirely intact.  Ambulatory referral neurology for further workup      Relevant Orders   Ambulatory referral to Neurology    Return for as scheduled.    Romilda Garret, NP

## 2022-07-07 NOTE — Assessment & Plan Note (Signed)
Patient brings up an intention tremor again.  Has been evaluated by neurology but would like a second opinion.  Patient also experiencing lightheadedness not secondary to hypertension or antihypertensive medications.  Ambulatory referral to neurology placed today

## 2022-07-07 NOTE — Patient Instructions (Signed)
Nice to see you today If you want to wait until I see you in December and bring your cuff that is fine If you want to do it before make a nurse visit when you leave for BP check  I have made the referral to a neurologist

## 2022-07-08 ENCOUNTER — Encounter: Payer: Self-pay | Admitting: Orthopaedic Surgery

## 2022-07-08 ENCOUNTER — Ambulatory Visit: Payer: Medicare Other | Admitting: Orthopaedic Surgery

## 2022-07-08 DIAGNOSIS — M12812 Other specific arthropathies, not elsewhere classified, left shoulder: Secondary | ICD-10-CM | POA: Diagnosis not present

## 2022-07-08 DIAGNOSIS — M170 Bilateral primary osteoarthritis of knee: Secondary | ICD-10-CM | POA: Diagnosis not present

## 2022-07-08 DIAGNOSIS — M75102 Unspecified rotator cuff tear or rupture of left shoulder, not specified as traumatic: Secondary | ICD-10-CM

## 2022-07-08 MED ORDER — LIDOCAINE HCL 1 % IJ SOLN
2.0000 mL | INTRAMUSCULAR | Status: AC | PRN
  Administered 2022-07-08: 2 mL

## 2022-07-08 MED ORDER — METHYLPREDNISOLONE ACETATE 40 MG/ML IJ SUSP
80.0000 mg | INTRAMUSCULAR | Status: AC | PRN
  Administered 2022-07-08: 80 mg via INTRA_ARTICULAR

## 2022-07-08 MED ORDER — BUPIVACAINE HCL 0.25 % IJ SOLN
2.0000 mL | INTRAMUSCULAR | Status: AC | PRN
  Administered 2022-07-08: 2 mL via INTRA_ARTICULAR

## 2022-07-08 NOTE — Progress Notes (Signed)
Office Visit Note   Patient: James Pearson           Date of Birth: 02-19-1946           MRN: [id number] Visit Date: 07/08/2022              Requested by: Michela Pitcher, NP [address],  [place] [address] PCP: Michela Pitcher, NP   Assessment & Plan: Visit Diagnoses:  1. Bilateral primary osteoarthritis of knee   2. Rotator cuff tear arthropathy of left shoulder     Plan: James Pearson has been followed for the osteoarthritis predominantly involving the right knee.  He finished a course of Synvisc 1 month ago and relates "it did not help very much".  He wanted to discuss treatment options for his knee.  I really think the best approach would be total knee replacement.  He has had cortisone and viscosupplementation and continues to have some discomfort.  We will refer him to Dr. Marlou Sa as he is having a problem with his left shoulder as well.  X-rays of his right knee recently demonstrated advanced osteoarthritis with narrowing of the medial joint space and about 1 degree of varus.  He also has been experiencing pain in his left shoulder.  He had an MRI scan performed in July demonstrating mild to moderate degenerative changes of the [facility] joint and moderate thinning of the articular cartilage of the glenohumeral joint.  In addition he had high-grade partial-thickness articular sided horizontal tear of the supraspinatus measuring up to 17 mm.  Several months ago I injected the subacromial space with cortisone he relates that helped for at least 2 months.  He has had some recurrence.  I think at some point he may be a candidate for reverse shoulder arthroplasty.  Today I will inject the subacromial space and glenohumeral joint with Depo-Medrol.  We will have him see Dr. Marlou Sa who can assess both the left shoulder and the right knee.  All questions were answered  Follow-Up Instructions: Return refer to Dr Marlou Sa.   Orders:  No orders of the defined types were placed in this  encounter.  No orders of the defined types were placed in this encounter.     Procedures: Large Joint Inj: L glenohumeral on 07/08/2022 3:20 PM Details: 25 G 1.5 in needle, posterior approach  Arthrogram: No  Medications: 2 mL lidocaine 1 %; 80 mg methylPREDNISolone acetate 40 MG/ML; 2 mL bupivacaine 0.25 % Procedure, treatment alternatives, risks and benefits explained, specific risks discussed. Consent was given by the patient. Immediately prior to procedure a time out was called to verify the correct patient, procedure, equipment, support staff and site/side marked as required. Patient was prepped and draped in the usual sterile fashion.       Clinical Data: No additional findings.   Subjective: No chief complaint on file. James Pearson recently completed a course of Synvisc in the right knee and relates he is "not sure it made much of a difference ". He wanted discussed different option.  In addition he is having recurrent pain in his left shoulder.  He had a cortisone injection in the subacromial space about 2 months ago with recurrence.  He had an MRI scan of the same shoulder in July demonstrating moderate degenerative changes of the glenohumeral joint with a high-grade partial tear of the supraspinatus  HPI  Review of Systems   Objective: Vital Signs: There were no vitals taken for this visit.  Physical Exam Constitutional:      Appearance: He is well-developed.  Eyes:     Pupils: Pupils are equal, round, and reactive to light.  Pulmonary:     Effort: Pulmonary effort is normal.  Skin:    General: Skin is warm and dry.  Neurological:     Mental Status: He is alert and oriented to person, place, and time.  Psychiatric:        Behavior: Behavior normal.     Ortho Exam awake alert and oriented x3.  Comfortable sitting.  Minimal effusion right knee with mostly medial joint pain.  Full extension of flex at least 100 degrees without instability.  Having more medial  than lateral joint pain and some patella crepitation.  Slight varus with weightbearing.  No popliteal pain.  No calf pain.  Left shoulder with positive impingement and empty can testing.  Thought he had good strength.  Able to place his arm fully overhead but with a circuitous arc of motion.  Negative speeds sign.  Some pain along the anterior pectoral groove but no crepitation.  Some hypertrophic change at the [facility] joint but minimal pain.  Good grip and release  Specialty Comments:  No specialty comments available.  Imaging: No results found.   PMFS History: Patient Active Problem List   Diagnosis Date Noted   Lightheadedness 07/07/2022   Medication refill 05/11/2022   Pain of left eye 05/11/2022   Red eye 05/11/2022   Lung nodule seen on imaging study 04/26/2022   Lung nodule 04/26/2022   Abnormal chest x-ray 04/26/2022   DOE (dyspnea on exertion) 04/22/2022   Acute non-recurrent maxillary sinusitis 04/08/2022   High risk medication use 03/22/2022   Elevated hemoglobin ([place]) 03/22/2022   Former smoker 01/28/2022   Unexplained weight loss 01/28/2022   Bilateral edema of lower extremity 10/09/2021   Pain, dental 10/09/2021   Preventative health care 10/09/2021   Tremor 09/18/2021   Left hip pain 09/18/2021   Anxiety and depression 08/26/2021   Chronic pain of right knee 08/26/2021   Grief 08/26/2021   Rotator cuff tear arthropathy 06/23/2021   Psychosexual dysfunction with inhibited sexual excitement 06/23/2021   Obesity 06/23/2021   Benign neoplasm of colon 06/23/2021   Carpal tunnel syndrome 06/23/2021   History of adenomatous polyp of colon 06/23/2021   Brachial neuritis 06/23/2021   Parkinson's disease 06/23/2021   Dysuria 03/18/2021   Low testosterone in male 03/18/2021   Hypokalemia 01/06/2021   Pain in left shoulder 11/05/2020   Bilateral primary osteoarthritis of knee 12/19/2019   Unilateral primary osteoarthritis, right knee 10/30/2019   Insomnia 05/06/2015    Essential hypertension 05/06/2015   Chronic back pain 05/06/2015   Type 2 diabetes mellitus without complication ([place]) 02/58/5277   Stroke ([place]) 05/06/2015   HLD (hyperlipidemia) 05/06/2015   BPH (benign prostatic hyperplasia) 05/06/2015   Esophageal reflux 05/06/2015   History of TB (tuberculosis) 05/06/2015   Iron deficiency 04/10/2015   Past Medical History:  Diagnosis Date   Arthritis    GERD (gastroesophageal reflux disease)    Hepatitis    Hypertension    Parkinson's disease    Positive TB test    as a child   Stroke ([place]) 08/10/2011    Family History  Problem Relation Age of Onset   Heart disease Mother    Hypertension Mother    Tremor Father    COPD Sister    Heart disease Sister    Hypertension Sister     Past Surgical  History:  Procedure Laterality Date   CHOLECYSTECTOMY     HAND SURGERY     9 subsequent surgeries   rotator cuff surgery     Social History   Occupational History   Not on file  Tobacco Use   Smoking status: Former    Types: Cigarettes    Quit date: 08/09/1990    Years since quitting: 31.9   Smokeless tobacco: Never  Vaping Use   Vaping Use: Never used  Substance and Sexual Activity   Alcohol use: No    Alcohol/week: 0.0 standard drinks of alcohol   Drug use: No   Sexual activity: Yes     Garald Balding, MD   Note - This record has been created using Editor, commissioning.  Chart creation errors have been sought, but may not always  have been located. Such creation errors do not reflect on  the standard of medical care.

## 2022-07-12 ENCOUNTER — Ambulatory Visit: Payer: Medicare Other | Admitting: Orthopedic Surgery

## 2022-07-12 DIAGNOSIS — G8929 Other chronic pain: Secondary | ICD-10-CM | POA: Diagnosis not present

## 2022-07-12 DIAGNOSIS — M12812 Other specific arthropathies, not elsewhere classified, left shoulder: Secondary | ICD-10-CM

## 2022-07-12 DIAGNOSIS — M25512 Pain in left shoulder: Secondary | ICD-10-CM

## 2022-07-12 DIAGNOSIS — M75102 Unspecified rotator cuff tear or rupture of left shoulder, not specified as traumatic: Secondary | ICD-10-CM

## 2022-07-13 ENCOUNTER — Encounter: Payer: Self-pay | Admitting: Orthopedic Surgery

## 2022-07-13 NOTE — Progress Notes (Unsigned)
Office Visit Note   Patient: James Pearson           Date of Birth: 1945-10-31           MRN: [id number] Visit Date: 07/12/2022 Requested by: Michela Pitcher, NP [address],  [place] [address] PCP: Michela Pitcher, NP  Subjective: Chief Complaint  Patient presents with   Left Shoulder - Pain   Right Knee - Pain    HPI: James Pearson is a 76 y.o. male who presents to the office reporting left shoulder pain.  He is right-hand dominant.  States that his left shoulder is popping.  Describes decreased range of motion.  Had prior right shoulder surgery in 2014.  He also has had a recent injection in the left shoulder which gave him good relief.  Now his pain has recurred.  Nonetheless he is able to go overhead with his left arm whereas prior to the injection he could not.  Symptoms ongoing in the left shoulder greater than a year.  Hard for him to sleep on that left-hand side.  He does live alone.  Watches TV some during the day.  Likes to workout about 2 to 3 hours a day and that includes walking.  Also describes right knee pain.  That has improved over the past week.  Does not wake him from sleep anymore.  Does report some stiffness and swelling in the knee..                ROS: All systems reviewed are negative as they relate to the chief complaint within the history of present illness.  Patient denies fevers or chills.  Assessment & Plan: Visit Diagnoses:  1. Rotator cuff tear arthropathy of left shoulder   2. Chronic left shoulder pain     Plan: Impression is left shoulder pain.  MRI scan of the left shoulder is reviewed and that shows high-grade partial-thickness articular sided tear within the supraspinatus tendon with mild AC joint degenerative changes and moderate thinning of the glenohumeral cartilage.  We talked about surgical intervention for the shoulder which he wants to hold off on at this time particularly since the shoulder injection is helping him as much as  it was.  Plan to return late January for clinical recheck and repeat injection possibly in the knee and/or shoulder.  For now he is managing with his current degenerative changes in the knee and shoulder.  Follow-Up Instructions: No follow-ups on file.   Orders:  No orders of the defined types were placed in this encounter.  No orders of the defined types were placed in this encounter.     Procedures: No procedures performed   Clinical Data: No additional findings.  Objective: Vital Signs: There were no vitals taken for this visit.  Physical Exam:  Constitutional: Patient appears well-developed HEENT:  Head: Normocephalic Eyes:EOM are normal Neck: Normal range of motion Cardiovascular: Normal rate Pulmonary/chest: Effort normal Neurologic: Patient is alert Skin: Skin is warm Psychiatric: Patient has normal mood and affect  Ortho Exam: Ortho exam demonstrates no knee effusion on the right-hand side.  Collateral crucial ligaments are stable.  Has mild medial greater than lateral joint line tenderness.  Left shoulder exam demonstrates range of motion of 40/100/160.  Mild AC joint tenderness.  Mild anterolateral crepitus but reasonable strength infraspinatus supraspinatus and subscap testing.  Does have a little bit of medial sided tenderness of the medial border of the scapula.  No scapulothoracic crepitus.  Specialty Comments:  No specialty comments available.  Imaging: No results found.   PMFS History: Patient Active Problem List   Diagnosis Date Noted   Lightheadedness 07/07/2022   Medication refill 05/11/2022   Pain of left eye 05/11/2022   Red eye 05/11/2022   Lung nodule seen on imaging study 04/26/2022   Lung nodule 04/26/2022   Abnormal chest x-ray 04/26/2022   DOE (dyspnea on exertion) 04/22/2022   Acute non-recurrent maxillary sinusitis 04/08/2022   High risk medication use 03/22/2022   Elevated hemoglobin ([place]) 03/22/2022   Former smoker 01/28/2022    Unexplained weight loss 01/28/2022   Bilateral edema of lower extremity 10/09/2021   Pain, dental 10/09/2021   Preventative health care 10/09/2021   Tremor 09/18/2021   Left hip pain 09/18/2021   Anxiety and depression 08/26/2021   Chronic pain of right knee 08/26/2021   Grief 08/26/2021   Rotator cuff tear arthropathy 06/23/2021   Psychosexual dysfunction with inhibited sexual excitement 06/23/2021   Obesity 06/23/2021   Benign neoplasm of colon 06/23/2021   Carpal tunnel syndrome 06/23/2021   History of adenomatous polyp of colon 06/23/2021   Brachial neuritis 06/23/2021   Parkinson's disease 06/23/2021   Dysuria 03/18/2021   Low testosterone in male 03/18/2021   Hypokalemia 01/06/2021   Pain in left shoulder 11/05/2020   Bilateral primary osteoarthritis of knee 12/19/2019   Unilateral primary osteoarthritis, right knee 10/30/2019   Insomnia 05/06/2015   Essential hypertension 05/06/2015   Chronic back pain 05/06/2015   Type 2 diabetes mellitus without complication ([place]) 54/62/7035   Stroke ([place]) 05/06/2015   HLD (hyperlipidemia) 05/06/2015   BPH (benign prostatic hyperplasia) 05/06/2015   Esophageal reflux 05/06/2015   History of TB (tuberculosis) 05/06/2015   Iron deficiency 04/10/2015   Past Medical History:  Diagnosis Date   Arthritis    GERD (gastroesophageal reflux disease)    Hepatitis    Hypertension    Parkinson's disease    Positive TB test    as a child   Stroke ([place]) 08/10/2011    Family History  Problem Relation Age of Onset   Heart disease Mother    Hypertension Mother    Tremor Father    COPD Sister    Heart disease Sister    Hypertension Sister     Past Surgical History:  Procedure Laterality Date   CHOLECYSTECTOMY     HAND SURGERY     9 subsequent surgeries   rotator cuff surgery     Social History   Occupational History   Not on file  Tobacco Use   Smoking status: Former    Types: Cigarettes    Quit date: 08/09/1990    Years since  quitting: 31.9   Smokeless tobacco: Never  Vaping Use   Vaping Use: Never used  Substance and Sexual Activity   Alcohol use: No    Alcohol/week: 0.0 standard drinks of alcohol   Drug use: No   Sexual activity: Yes

## 2022-07-15 ENCOUNTER — Encounter: Payer: Self-pay | Admitting: Orthopedic Surgery

## 2022-07-21 DIAGNOSIS — R42 Dizziness and giddiness: Secondary | ICD-10-CM | POA: Diagnosis not present

## 2022-07-21 DIAGNOSIS — R2689 Other abnormalities of gait and mobility: Secondary | ICD-10-CM | POA: Diagnosis not present

## 2022-07-21 DIAGNOSIS — G479 Sleep disorder, unspecified: Secondary | ICD-10-CM | POA: Diagnosis not present

## 2022-07-21 DIAGNOSIS — R251 Tremor, unspecified: Secondary | ICD-10-CM | POA: Diagnosis not present

## 2022-07-23 ENCOUNTER — Ambulatory Visit (INDEPENDENT_AMBULATORY_CARE_PROVIDER_SITE_OTHER): Payer: Medicare Other | Admitting: Nurse Practitioner

## 2022-07-23 ENCOUNTER — Encounter: Payer: Self-pay | Admitting: Nurse Practitioner

## 2022-07-23 VITALS — BP 118/64 | HR 56 | Temp 98.1°F | Ht 67.0 in | Wt 181.0 lb

## 2022-07-23 DIAGNOSIS — E119 Type 2 diabetes mellitus without complications: Secondary | ICD-10-CM

## 2022-07-23 DIAGNOSIS — I10 Essential (primary) hypertension: Secondary | ICD-10-CM

## 2022-07-23 DIAGNOSIS — R413 Other amnesia: Secondary | ICD-10-CM

## 2022-07-23 LAB — POCT GLYCOSYLATED HEMOGLOBIN (HGB A1C): Hemoglobin A1C: 7.5 % — AB (ref 4.0–5.6)

## 2022-07-23 NOTE — Assessment & Plan Note (Addendum)
Patient diet controlled with his diabetes.  A1c is 7.5 today.  This is a marked increase since last time.  This is the highest it has been in several years.  Patient is that he has been eating lots of candy as of late and been baking more.  Courage patient to work on lifestyle modifications of A1c is still elevated next visit we will have to put patient on medication.  He acknowledged  Patient recently seen at the [facility] and had labs performed.  We have a release of information on file we will obtain most recent labs from [place]

## 2022-07-23 NOTE — Patient Instructions (Signed)
Nice to see you today I will see if I can get the results of the labs from the [place] Follow up with me in 3 months, sooner if you need me That will be your physcial and we will do labs at that point.

## 2022-07-23 NOTE — Assessment & Plan Note (Signed)
During HPI had a question for isolated memory impairment.  MMSE performed in office patient has no cognitive impairment with a score of 28/30.

## 2022-07-23 NOTE — Progress Notes (Signed)
Established Patient Office Visit  Subjective   Patient ID: Aldrick Derrig, male    DOB: 08-07-1946  Age: 76 y.o. MRN: [id number]  Chief Complaint  Patient presents with   Follow-up    BP and DM    HPI  DM2: States that he has been earing a lot of candy as of late. States that he has been baking more and having sweet potato pies. States that has been maintained Checks glucose at home approx 1-2 twice a month 119 was the last glucose at home.  HTN: states  that he will check it once a week. States that light headedness have not improved. If it has changed it may have increased some  Recently seen by neurology and was placed on propanolol. States that he was experiencing dizziness with head movement sn had improved with propranolol  Memory concerns: During HPI patient mentioned that he has a sensation that sometimes in his house sometimes that he is doing with a cane.  States he is very particular and replace his legs and has a decently good memory states also he will remember what is in the fridge and sometimes relates back is different and he feels like he has not changed her eating the food that point.  Did administer MMSE in office.  Patient scored 28 out of 30 no cognitive impairment.  Patient denies hallucinations.      Review of Systems  Constitutional:  Negative for chills and fever.  Respiratory:  Positive for shortness of breath (DOE).   Cardiovascular:  Negative for chest pain.  Neurological:  Positive for dizziness. Negative for loss of consciousness and headaches.  Psychiatric/Behavioral:  Negative for hallucinations and suicidal ideas.       Objective:     BP 118/64   Pulse (!) 56   Temp 98.1 F (36.7 C) (Temporal)   Ht '5\' 7"'$  (1.702 m)   Wt 181 lb (82.1 kg)   SpO2 98%   BMI 28.35 kg/m  BP Readings from Last 3 Encounters:  07/23/22 118/64  07/07/22 132/64  06/23/22 (!) 156/76   Wt Readings from Last 3 Encounters:  07/23/22 181 lb (82.1 kg)  07/07/22  181 lb (82.1 kg)  05/11/22 178 lb (80.7 kg)      Physical Exam Vitals and nursing note reviewed.  Constitutional:      Appearance: Normal appearance.  Cardiovascular:     Rate and Rhythm: Normal rate and regular rhythm.     Heart sounds: Normal heart sounds.  Pulmonary:     Effort: Pulmonary effort is normal.     Breath sounds: Normal breath sounds.  Abdominal:     General: Bowel sounds are normal.  Musculoskeletal:     Right lower leg: 1+ Pitting Edema present.     Left lower leg: 1+ Pitting Edema present.  Neurological:     Mental Status: He is alert.  Psychiatric:        Mood and Affect: Mood normal.        Behavior: Behavior normal.        Thought Content: Thought content normal.        Judgment: Judgment normal.      No results found for any visits on 07/23/22.    The ASCVD Risk score (Arnett DK, et al., 2019) failed to calculate for the following reasons:   The patient has a prior MI or stroke diagnosis    Assessment & Plan:   Problem List Items Addressed This Visit  Cardiovascular and Mediastinum   Essential hypertension    Patient blood pressure within normal limits.  He is currently maintained on enalapril 40 mg, amlodipine 5 mg, hydrochlorothiazide.  Patient did bring his blood pressure cuff from home to check against our cuff.  Patient still experiencing the lightheaded sensation.  Continue medications as prescribed      Relevant Medications   propranolol (INDERAL) 20 MG tablet     Endocrine   Type 2 diabetes mellitus without complication ([place]) - Primary    Patient diet controlled with his diabetes.  A1c is 7.5 today.  This is a marked increase since last time.  This is the highest it has been in several years.  Patient is that he has been eating lots of candy as of late and been baking more.  Courage patient to work on lifestyle modifications of A1c is still elevated next visit we will have to put patient on medication.  He  acknowledged  Patient recently seen at the [facility] and had labs performed.  We have a release of information on file we will obtain most recent labs from [place]      Relevant Orders   POCT glycosylated hemoglobin (Hb A1C)     Other   Isolated memory impairment    During HPI had a question for isolated memory impairment.  MMSE performed in office patient has no cognitive impairment with a score of 28/30.       Return in about 3 months (around 10/22/2022) for CPE and labs.    Romilda Garret, NP

## 2022-07-23 NOTE — Assessment & Plan Note (Signed)
Patient blood pressure within normal limits.  He is currently maintained on enalapril 40 mg, amlodipine 5 mg, hydrochlorothiazide.  Patient did bring his blood pressure cuff from home to check against our cuff.  Patient still experiencing the lightheaded sensation.  Continue medications as prescribed

## 2022-08-03 ENCOUNTER — Encounter: Payer: Self-pay | Admitting: Nurse Practitioner

## 2022-08-12 ENCOUNTER — Encounter: Payer: Self-pay | Admitting: Nurse Practitioner

## 2022-08-12 ENCOUNTER — Ambulatory Visit (INDEPENDENT_AMBULATORY_CARE_PROVIDER_SITE_OTHER): Payer: Medicare Other | Admitting: Nurse Practitioner

## 2022-08-12 VITALS — BP 128/70 | HR 65 | Ht 67.0 in | Wt 174.0 lb

## 2022-08-12 DIAGNOSIS — E119 Type 2 diabetes mellitus without complications: Secondary | ICD-10-CM | POA: Diagnosis not present

## 2022-08-12 DIAGNOSIS — I10 Essential (primary) hypertension: Secondary | ICD-10-CM | POA: Diagnosis not present

## 2022-08-12 DIAGNOSIS — R634 Abnormal weight loss: Secondary | ICD-10-CM

## 2022-08-12 NOTE — Assessment & Plan Note (Signed)
Patient's blood pressure is well-controlled in office today.  Continue medication as prescribed.  Patient currently on enalapril, amlodipine, HCTZ.

## 2022-08-12 NOTE — Patient Instructions (Signed)
Nice to see you today I have placed a referral for nutrition  If you continue to loss weight let me know Follow up in 2.5 months with me for a recheck

## 2022-08-12 NOTE — Assessment & Plan Note (Signed)
Patient is lost approximately 7 pounds over the past month unintentional per his report.  He has been not eating to avoid sodium.  Did encourage patient to not skip meals due to sodium content.  Will refer him to dietitian/nutritionist for healthier options with lower sodium content.

## 2022-08-12 NOTE — Progress Notes (Signed)
Established Patient Office Visit  Subjective   Patient ID: James Pearson, male    DOB: 08-Jul-1946  Age: 77 y.o. MRN: [id number]  Chief Complaint  Patient presents with   Hypertension      HTN: Patient has an extensive history with me in the [place] and managing his blood pressure.  In the beginning he was maintained on amlodipine product which caused 2-3+ pitting edema in the lower extremities.  We were able to decrease amlodipine from 10 mg to 5 mg with resolution of the swelling.  At that juncture patient blood pressure did become uncontrolled.  And we did increase his enalapril from 20 mg to 40 mg.  Patient is also on hydrochlorothiazide 12.5 mg daily.  He has had fluctuations in blood pressure most the time in office that have been within normal limits he had some outliers when checking them at home.  He has had some headaches in the past has been seen by ophthalmology and was negative secondary to his hypertension.  He is here today for a recheck  States that he is still going to the gym. States that he has looked at the sodium content inside of his meals at home and has noticed that they do contain a lot.  Patient states that sometimes he might eat to avoid having meals with sodium in it.  Patient has not talked to a nutritionist/dietitian in the past.  States that he has a headache today that is a dull aching pain.  This is located on the left side of his head.  States that he has been having headaches less frequently than before.  He was evaluated by ophthalmology and they do not think is coming from his eyes.  He did mention that he does have cataracts and will proceed to have cataract removal surgery.    Review of Systems  Constitutional:  Positive for weight loss. Negative for chills and fever.  Respiratory:  Negative for shortness of breath.   Cardiovascular:  Negative for chest pain.  Neurological:  Positive for headaches.      Objective:     BP 128/70   Pulse 65    Ht '5\' 7"'$  (1.702 m)   Wt 174 lb (78.9 kg)   SpO2 96%   BMI 27.25 kg/m  BP Readings from Last 3 Encounters:  08/12/22 128/70  07/23/22 118/64  07/07/22 132/64   Wt Readings from Last 3 Encounters:  08/12/22 174 lb (78.9 kg)  07/23/22 181 lb (82.1 kg)  07/07/22 181 lb (82.1 kg)      Physical Exam Vitals and nursing note reviewed.  Constitutional:      Appearance: Normal appearance.  Cardiovascular:     Rate and Rhythm: Normal rate and regular rhythm.     Heart sounds: Normal heart sounds.  Pulmonary:     Effort: Pulmonary effort is normal.     Breath sounds: Normal breath sounds.  Musculoskeletal:     Comments: No paraspinal muscle tightness or tenderness.  No shoulder muscles tightness or tenderness  Neurological:     Mental Status: He is alert.      No results found for any visits on 08/12/22.    The ASCVD Risk score (Arnett DK, et al., 2019) failed to calculate for the following reasons:   The patient has a prior MI or stroke diagnosis    Assessment & Plan:   Problem List Items Addressed This Visit       Cardiovascular and Mediastinum   Essential  hypertension    Patient's blood pressure is well-controlled in office today.  Continue medication as prescribed.  Patient currently on enalapril, amlodipine, HCTZ.        Endocrine   Type 2 diabetes mellitus without complication ([place]) - Primary    Not currently on any antidiabetic medications.  Checking blood glucose at home.  Told him he can check it 3 times a week more if he wanted it.  Did discuss when have low blood glucose what to eat.  Will refer him to nutritionist for education on appropriate diet in regards to diabetes and hypertension.      Relevant Orders   Referral to Nutrition and Diabetes Services     Other   Unexplained weight loss    Patient is lost approximately 7 pounds over the past month unintentional per his report.  He has been not eating to avoid sodium.  Did encourage patient to not  skip meals due to sodium content.  Will refer him to dietitian/nutritionist for healthier options with lower sodium content.      Relevant Orders   Referral to Nutrition and Diabetes Services    Return in about 11 weeks (around 10/28/2022) for DM/HTN follow up .    Romilda Garret, NP

## 2022-08-12 NOTE — Assessment & Plan Note (Signed)
Not currently on any antidiabetic medications.  Checking blood glucose at home.  Told him he can check it 3 times a week more if he wanted it.  Did discuss when have low blood glucose what to eat.  Will refer him to nutritionist for education on appropriate diet in regards to diabetes and hypertension.

## 2022-08-24 DIAGNOSIS — H9313 Tinnitus, bilateral: Secondary | ICD-10-CM | POA: Diagnosis not present

## 2022-08-24 DIAGNOSIS — R2689 Other abnormalities of gait and mobility: Secondary | ICD-10-CM | POA: Diagnosis not present

## 2022-08-24 DIAGNOSIS — R251 Tremor, unspecified: Secondary | ICD-10-CM | POA: Diagnosis not present

## 2022-08-24 DIAGNOSIS — G479 Sleep disorder, unspecified: Secondary | ICD-10-CM | POA: Diagnosis not present

## 2022-09-22 DIAGNOSIS — R42 Dizziness and giddiness: Secondary | ICD-10-CM | POA: Diagnosis not present

## 2022-09-22 DIAGNOSIS — H90A21 Sensorineural hearing loss, unilateral, right ear, with restricted hearing on the contralateral side: Secondary | ICD-10-CM | POA: Diagnosis not present

## 2022-09-23 ENCOUNTER — Other Ambulatory Visit: Payer: Self-pay | Admitting: Otolaryngology

## 2022-09-23 DIAGNOSIS — H9319 Tinnitus, unspecified ear: Secondary | ICD-10-CM

## 2022-09-23 DIAGNOSIS — H9041 Sensorineural hearing loss, unilateral, right ear, with unrestricted hearing on the contralateral side: Secondary | ICD-10-CM

## 2022-10-12 DIAGNOSIS — G4733 Obstructive sleep apnea (adult) (pediatric): Secondary | ICD-10-CM | POA: Diagnosis not present

## 2022-10-14 ENCOUNTER — Ambulatory Visit: Payer: Self-pay

## 2022-10-14 ENCOUNTER — Ambulatory Visit: Payer: Medicare Other | Admitting: Orthopedic Surgery

## 2022-10-14 ENCOUNTER — Ambulatory Visit (INDEPENDENT_AMBULATORY_CARE_PROVIDER_SITE_OTHER): Payer: Medicare Other

## 2022-10-14 DIAGNOSIS — M1711 Unilateral primary osteoarthritis, right knee: Secondary | ICD-10-CM

## 2022-10-14 DIAGNOSIS — M75102 Unspecified rotator cuff tear or rupture of left shoulder, not specified as traumatic: Secondary | ICD-10-CM | POA: Diagnosis not present

## 2022-10-14 DIAGNOSIS — M12812 Other specific arthropathies, not elsewhere classified, left shoulder: Secondary | ICD-10-CM | POA: Diagnosis not present

## 2022-10-15 ENCOUNTER — Encounter: Payer: Self-pay | Admitting: Orthopedic Surgery

## 2022-10-16 MED ORDER — BUPIVACAINE HCL 0.5 % IJ SOLN
9.0000 mL | INTRAMUSCULAR | Status: AC | PRN
  Administered 2022-10-14: 9 mL via INTRA_ARTICULAR

## 2022-10-16 MED ORDER — LIDOCAINE HCL 1 % IJ SOLN
5.0000 mL | INTRAMUSCULAR | Status: AC | PRN
  Administered 2022-10-14: 5 mL

## 2022-10-16 MED ORDER — BUPIVACAINE HCL 0.25 % IJ SOLN
4.0000 mL | INTRAMUSCULAR | Status: AC | PRN
  Administered 2022-10-14: 4 mL via INTRA_ARTICULAR

## 2022-10-16 MED ORDER — METHYLPREDNISOLONE ACETATE 40 MG/ML IJ SUSP
40.0000 mg | INTRAMUSCULAR | Status: AC | PRN
  Administered 2022-10-14: 40 mg via INTRA_ARTICULAR

## 2022-10-16 NOTE — Progress Notes (Signed)
Office Visit Note   Patient: James Pearson           Date of Birth: 28-Mar-1946           MRN: [id number] Visit Date: 10/14/2022 Requested by: Michela Pitcher, NP [place],   [id number] PCP: Michela Pitcher, NP  Subjective: Chief Complaint  Patient presents with   Left Shoulder - Pain   Right Knee - Pain    HPI: James Pearson is a 77 y.o. male who presents to the office reporting left shoulder pain and right knee pain.  Patient had gel injection in the right knee in October which gave him minimal relief.  Cortisone injections have been more helpful in the past.  States he ambulates with a limp and has difficulty getting up and down.  Tries to walk 3 to 5 miles a day.  He also reports pain in the left shoulder.  This pain comes and goes in severity.  Prior injections in the shoulder have helped.  Taking over-the-counter medication for his symptoms.  Does not have diabetes.  Would like repeat injections in both joints today.              ROS: All systems reviewed are negative as they relate to the chief complaint within the history of present illness.  Patient denies fevers or chills.  Assessment & Plan: Visit Diagnoses:  1. Rotator cuff tear arthropathy of left shoulder   2. Unilateral primary osteoarthritis, right knee     Plan: Impression is mild right knee medial compartment arthritis.  Trace effusion is present.  Repeat cortisone injection performed today.  Nonweightbearing quad strengthening exercises encouraged.  Patient also has left shoulder partial-thickness rotator cuff tearing along with mild AC joint arthropathy as well as glenohumeral joint arthritis.  Plan is ultrasound-guided injection into the left glenohumeral joint today.  He will follow-up as needed.  Follow-Up Instructions: No follow-ups on file.   Orders:  Orders Placed This Encounter  Procedures   US Guided Needle Placement - No Linked Charges   XR Knee 1-2 Views Right   No orders of  the defined types were placed in this encounter.     Procedures: Large Joint Inj: R knee on 10/14/2022 7:16 AM Indications: diagnostic evaluation, joint swelling and pain Details: 18 G 1.5 in needle, superolateral approach  Arthrogram: No  Medications: 5 mL lidocaine 1 %; 40 mg methylPREDNISolone acetate 40 MG/ML; 4 mL bupivacaine 0.25 % Outcome: tolerated well, no immediate complications Procedure, treatment alternatives, risks and benefits explained, specific risks discussed. Consent was given by the patient. Immediately prior to procedure a time out was called to verify the correct patient, procedure, equipment, support staff and site/side marked as required. Patient was prepped and draped in the usual sterile fashion.    Large Joint Inj: L glenohumeral on 10/14/2022 7:17 AM Indications: diagnostic evaluation and pain Details: 18 G 1.5 in needle, ultrasound-guided posterior approach  Arthrogram: No  Medications: 9 mL bupivacaine 0.5 %; 40 mg methylPREDNISolone acetate 40 MG/ML; 5 mL lidocaine 1 % Outcome: tolerated well, no immediate complications Procedure, treatment alternatives, risks and benefits explained, specific risks discussed. Consent was given by the patient. Immediately prior to procedure a time out was called to verify the correct patient, procedure, equipment, support staff and site/side marked as required. Patient was prepped and draped in the usual sterile fashion.       Clinical Data: No additional findings.  Objective: Vital Signs: There were  no vitals taken for this visit.  Physical Exam:  Constitutional: Patient appears well-developed HEENT:  Head: Normocephalic Eyes:EOM are normal Neck: Normal range of motion Cardiovascular: Normal rate Pulmonary/chest: Effort normal Neurologic: Patient is alert Skin: Skin is warm Psychiatric: Patient has normal mood and affect  Ortho Exam: Ortho exam demonstrates range of motion in the right knee of 3-1 20 with  trace effusion and mild medial joint line tenderness.  Extensor mechanism intact and there is no groin pain with internal and external rotation of the leg.  Range of motion of the left shoulder demonstrates range of motion of 35/85/140.  Rotator cuff strength is intact infraspinatus supraspinatus and subscap muscle testing with equivocal O'Brien's testing and mild AC joint tenderness to direct palpation.  Cervical spine range of motion is full.  Motor or sensory function to the left hand and arm intact.  Specialty Comments:  No specialty comments available.  Imaging: No results found.   PMFS History: Patient Active Problem List   Diagnosis Date Noted   Isolated memory impairment 07/23/2022   Lightheadedness 07/07/2022   Medication refill 05/11/2022   Pain of left eye 05/11/2022   Red eye 05/11/2022   Lung nodule seen on imaging study 04/26/2022   Lung nodule 04/26/2022   Abnormal chest x-ray 04/26/2022   DOE (dyspnea on exertion) 04/22/2022   Acute non-recurrent maxillary sinusitis 04/08/2022   High risk medication use 03/22/2022   Elevated hemoglobin ([place]) 03/22/2022   Former smoker 01/28/2022   Unexplained weight loss 01/28/2022   Bilateral edema of lower extremity 10/09/2021   Pain, dental 10/09/2021   Preventative health care 10/09/2021   Tremor 09/18/2021   Left hip pain 09/18/2021   Anxiety and depression 08/26/2021   Chronic pain of right knee 08/26/2021   Grief 08/26/2021   Rotator cuff tear arthropathy 06/23/2021   Psychosexual dysfunction with inhibited sexual excitement 06/23/2021   Obesity 06/23/2021   Benign neoplasm of colon 06/23/2021   Carpal tunnel syndrome 06/23/2021   History of adenomatous polyp of colon 06/23/2021   Brachial neuritis 06/23/2021   Parkinson's disease 06/23/2021   Dysuria 03/18/2021   Low testosterone in male 03/18/2021   Hypokalemia 01/06/2021   Pain in left shoulder 11/05/2020   Bilateral primary osteoarthritis of knee 12/19/2019    Unilateral primary osteoarthritis, right knee 10/30/2019   Insomnia 05/06/2015   Essential hypertension 05/06/2015   Chronic back pain 05/06/2015   Type 2 diabetes mellitus without complication ([place]) 123456   Stroke ([place]) 05/06/2015   HLD (hyperlipidemia) 05/06/2015   BPH (benign prostatic hyperplasia) 05/06/2015   Esophageal reflux 05/06/2015   History of TB (tuberculosis) 05/06/2015   Iron deficiency 04/10/2015   Past Medical History:  Diagnosis Date   Arthritis    GERD (gastroesophageal reflux disease)    Hepatitis    Hypertension    Parkinson's disease    Positive TB test    as a child   Stroke ([place]) 08/10/2011    Family History  Problem Relation Age of Onset   Heart disease Mother    Hypertension Mother    Tremor Father    COPD Sister    Heart disease Sister    Hypertension Sister     Past Surgical History:  Procedure Laterality Date   CHOLECYSTECTOMY     HAND SURGERY     9 subsequent surgeries   rotator cuff surgery     Social History   Occupational History   Not on file  Tobacco Use   Smoking  status: Former    Types: Cigarettes    Quit date: 08/09/1990    Years since quitting: 32.2   Smokeless tobacco: Never  Vaping Use   Vaping Use: Never used  Substance and Sexual Activity   Alcohol use: No    Alcohol/week: 0.0 standard drinks of alcohol   Drug use: No   Sexual activity: Yes

## 2022-10-26 DIAGNOSIS — G479 Sleep disorder, unspecified: Secondary | ICD-10-CM | POA: Diagnosis not present

## 2022-10-26 DIAGNOSIS — M5442 Lumbago with sciatica, left side: Secondary | ICD-10-CM | POA: Diagnosis not present

## 2022-10-26 DIAGNOSIS — G8929 Other chronic pain: Secondary | ICD-10-CM | POA: Diagnosis not present

## 2022-10-26 DIAGNOSIS — M5441 Lumbago with sciatica, right side: Secondary | ICD-10-CM | POA: Diagnosis not present

## 2022-10-26 DIAGNOSIS — R2689 Other abnormalities of gait and mobility: Secondary | ICD-10-CM | POA: Diagnosis not present

## 2022-10-26 DIAGNOSIS — R251 Tremor, unspecified: Secondary | ICD-10-CM | POA: Diagnosis not present

## 2022-10-27 ENCOUNTER — Other Ambulatory Visit: Payer: Self-pay | Admitting: Student

## 2022-10-27 DIAGNOSIS — G8929 Other chronic pain: Secondary | ICD-10-CM

## 2022-10-28 ENCOUNTER — Encounter: Payer: Self-pay | Admitting: Nurse Practitioner

## 2022-10-28 ENCOUNTER — Ambulatory Visit (INDEPENDENT_AMBULATORY_CARE_PROVIDER_SITE_OTHER): Payer: Medicare Other | Admitting: Nurse Practitioner

## 2022-10-28 VITALS — BP 114/60 | HR 85 | Temp 98.4°F | Resp 16 | Ht 67.0 in | Wt 177.5 lb

## 2022-10-28 DIAGNOSIS — I10 Essential (primary) hypertension: Secondary | ICD-10-CM | POA: Diagnosis not present

## 2022-10-28 DIAGNOSIS — L84 Corns and callosities: Secondary | ICD-10-CM

## 2022-10-28 DIAGNOSIS — E785 Hyperlipidemia, unspecified: Secondary | ICD-10-CM | POA: Diagnosis not present

## 2022-10-28 DIAGNOSIS — E119 Type 2 diabetes mellitus without complications: Secondary | ICD-10-CM | POA: Diagnosis not present

## 2022-10-28 DIAGNOSIS — Z125 Encounter for screening for malignant neoplasm of prostate: Secondary | ICD-10-CM

## 2022-10-28 LAB — COMPREHENSIVE METABOLIC PANEL
ALT: 21 U/L (ref 0–53)
AST: 23 U/L (ref 0–37)
Albumin: 4 g/dL (ref 3.5–5.2)
Alkaline Phosphatase: 73 U/L (ref 39–117)
BUN: 9 mg/dL (ref 6–23)
CO2: 34 mEq/L — ABNORMAL HIGH (ref 19–32)
Calcium: 9.2 mg/dL (ref 8.4–10.5)
Chloride: 101 mEq/L (ref 96–112)
Creatinine, Ser: 1 mg/dL (ref 0.40–1.50)
GFR: 73.67 mL/min (ref >60.00)
Glucose, Bld: 172 mg/dL — ABNORMAL HIGH (ref 70–99)
Potassium: 3.2 mEq/L — ABNORMAL LOW (ref 3.5–5.1)
Sodium: 142 mEq/L (ref 135–145)
Total Bilirubin: 1.4 mg/dL — ABNORMAL HIGH (ref 0.2–1.2)
Total Protein: 6.7 g/dL (ref 6.0–8.3)

## 2022-10-28 LAB — POCT GLYCOSYLATED HEMOGLOBIN (HGB A1C): Hemoglobin A1C: 7.1 % — AB (ref 4.0–5.6)

## 2022-10-28 LAB — LIPID PANEL
Cholesterol: 97 mg/dL (ref 0–200)
HDL: 55.6 mg/dL (ref >39.00)
LDL Cholesterol: 28 mg/dL (ref 0–99)
NonHDL: 41.79
Total CHOL/HDL Ratio: 2
Triglycerides: 69 mg/dL (ref 0.0–149.0)
VLDL: 13.8 mg/dL (ref 0.0–40.0)

## 2022-10-28 LAB — PSA, MEDICARE: PSA: 1.65 ng/ml (ref 0.10–4.00)

## 2022-10-28 LAB — CBC
HCT: 45.7 % (ref 39.0–52.0)
Hemoglobin: 15.2 g/dL (ref 13.0–17.0)
MCHC: 33.3 g/dL (ref 30.0–36.0)
MCV: 89.2 fl (ref 78.0–100.0)
Platelets: 150 10*3/uL (ref 150.0–400.0)
RBC: 5.12 Mil/uL (ref 4.22–5.81)
RDW: 15.9 % — ABNORMAL HIGH (ref 11.5–15.5)
WBC: 8.1 10*3/uL (ref 4.0–10.5)

## 2022-10-28 LAB — MICROALBUMIN / CREATININE URINE RATIO
Creatinine,U: 21.1 mg/dL
Microalb Creat Ratio: 3.3 mg/g (ref 0.0–30.0)
Microalb, Ur: <0.7 mg/dL (ref 0.0–1.9)

## 2022-10-28 NOTE — Patient Instructions (Addendum)
Nice to see you today I will be in touch with the labs once I have results Follow up with me in 4 months for your physical, sooner if you need me

## 2022-10-28 NOTE — Assessment & Plan Note (Signed)
Pending lipid panel today.  Patient on atorvastatin 80 mg daily continue

## 2022-10-28 NOTE — Assessment & Plan Note (Signed)
A1c is 7.1% today.  Goal for patient is under 8 he is maintaining currently on diet and lifestyle modifications slowly.  Continue the same

## 2022-10-28 NOTE — Progress Notes (Signed)
Established Patient Office Visit  Subjective   Patient ID: James Pearson, male    DOB: [birth year]  Age: 77 y.o. MRN: [id number]  Chief Complaint  Patient presents with   Hypertension   Diabetes       Weight loss: Patient was concerned because he was losing weight unintentionally.  States that he was avoiding meals because his meal options were high in sodium.  He is here for recheck. States that he weighted at home and was 174 pounds but that was without clothing.  DM2: Patient currently maintained on diet and lifestyle modifications alone.  [facility] States he does check it once a week and it was 112  HTN: Patient is currently maintained on amlodipine 5 mg, enalapril 40 mg hydrochlorothiazide 12.5 mg. States that he is having some dizziness and lightheaded but it is infrequently.    Review of Systems  Constitutional:  Negative for chills and fever.  Respiratory:  Negative for shortness of breath.   Cardiovascular:  Negative for chest pain.  Neurological:  Positive for dizziness. Negative for headaches.      Objective:     BP 114/60   Pulse 85   Temp 98.4 F (36.9 C)   Resp 16   Ht 5\' 7"  (1.702 m)   Wt 177 lb 8 oz (80.5 kg)   SpO2 96%   BMI 27.80 kg/m  BP Readings from Last 3 Encounters:  10/28/22 114/60  08/12/22 128/70  07/23/22 118/64   Wt Readings from Last 3 Encounters:  10/28/22 177 lb 8 oz (80.5 kg)  08/12/22 174 lb (78.9 kg)  07/23/22 181 lb (82.1 kg)      Physical Exam Vitals and nursing note reviewed.  Constitutional:      Appearance: Normal appearance.  Cardiovascular:     Rate and Rhythm: Normal rate and regular rhythm.     Pulses:          Dorsalis pedis pulses are 2+ on the right side and 2+ on the left side.       Posterior tibial pulses are 2+ on the right side and 2+ on the left side.     Heart sounds: Normal heart sounds.  Pulmonary:     Effort: Pulmonary effort is normal.     Breath  sounds: Normal breath sounds.  Musculoskeletal:     Right lower leg: 1+ Pitting Edema present.     Left lower leg: 1+ Pitting Edema present.       Feet:  Feet:     Right foot:     Skin integrity: Callus and dry skin present.     Toenail Condition: Right toenails are long.     Left foot:     Skin integrity: Dry skin present.     Toenail Condition: Left toenails are long.  Neurological:     Mental Status: He is alert.      Results for orders placed or performed in visit on 10/28/22  POCT glycosylated hemoglobin (Hb A1C)  Result Value Ref Range   Hemoglobin A1C 7.1 (A) 4.0 - 5.6 %   HbA1c POC (<> result, manual entry)     HbA1c, POC (prediabetic range)     HbA1c, POC (controlled diabetic range)        The ASCVD Risk score (Arnett DK, et al., 2019) failed to calculate for the following reasons:   The patient has a prior MI or stroke diagnosis  Assessment & Plan:   Problem List Items Addressed This Visit       Cardiovascular and Mediastinum   Essential hypertension    Patient currently maintained on amlodipine 5 mg daily, enalapril 40 mg daily, hydrochlorothiazide 12.5 mg daily.  Patient's blood pressure well under control.  Does have ability to check it at home.  Last frequent lightheadedness/dizziness.  Continue medication as prescribed      Relevant Orders   CBC   Comprehensive metabolic panel   Microalbumin / creatinine urine ratio     Endocrine   Type 2 diabetes mellitus without complication (HCC) - Primary    A1c is 7.1% today.  Goal for patient is under 8 he is maintaining currently on diet and lifestyle modifications slowly.  Continue the same      Relevant Medications   metFORMIN (GLUCOPHAGE-XR) 500 MG 24 hr tablet   Other Relevant Orders   POCT glycosylated hemoglobin (Hb A1C) (Completed)   CBC   Comprehensive metabolic panel   Microalbumin / creatinine urine ratio     Musculoskeletal and Integument   Callus    Patient has callus on the plantar  surface right foot.  Ambulatory referral made to podiatry patient is also interested in getting his nails clipped while there      Relevant Orders   Ambulatory referral to Podiatry     Other   HLD (hyperlipidemia)    Pending lipid panel today.  Patient on atorvastatin 80 mg daily continue      Relevant Orders   Lipid panel   Other Visit Diagnoses     Screening for prostate cancer       Relevant Orders   PSA, Medicare       Return in about 4 months (around 02/27/2023) for CPE.    Romilda Garret, NP

## 2022-10-28 NOTE — Assessment & Plan Note (Signed)
Patient has callus on the plantar surface right foot.  Ambulatory referral made to podiatry patient is also interested in getting his nails clipped while there

## 2022-10-28 NOTE — Assessment & Plan Note (Signed)
Patient currently maintained on amlodipine 5 mg daily, enalapril 40 mg daily, hydrochlorothiazide 12.5 mg daily.  Patient's blood pressure well under control.  Does have ability to check it at home.  Last frequent lightheadedness/dizziness.  Continue medication as prescribed

## 2022-10-29 ENCOUNTER — Other Ambulatory Visit: Payer: Self-pay | Admitting: Nurse Practitioner

## 2022-10-29 DIAGNOSIS — E876 Hypokalemia: Secondary | ICD-10-CM

## 2022-10-29 MED ORDER — POTASSIUM CHLORIDE CRYS ER 20 MEQ PO TBCR: 40.0000 meq | EXTENDED_RELEASE_TABLET | Freq: Once | ORAL | 0 refills | Status: DC

## 2022-11-10 ENCOUNTER — Encounter: Payer: Self-pay | Admitting: Podiatry

## 2022-11-10 ENCOUNTER — Ambulatory Visit: Payer: Medicare Other | Admitting: Podiatry

## 2022-11-10 DIAGNOSIS — M79675 Pain in left toe(s): Secondary | ICD-10-CM | POA: Diagnosis not present

## 2022-11-10 DIAGNOSIS — M79674 Pain in right toe(s): Secondary | ICD-10-CM | POA: Diagnosis not present

## 2022-11-10 DIAGNOSIS — M21621 Bunionette of right foot: Secondary | ICD-10-CM

## 2022-11-10 DIAGNOSIS — B351 Tinea unguium: Secondary | ICD-10-CM | POA: Diagnosis not present

## 2022-11-10 DIAGNOSIS — L84 Corns and callosities: Secondary | ICD-10-CM

## 2022-11-10 NOTE — Progress Notes (Signed)
Subjective:   Patient ID: James Pearson, male   DOB: 77 y.o.   MRN: [id number]   HPI Patient presents with nails that have elongated to a significant length as he is not able to take care of them due to back problems and lesions plantar bilateral that he wants checked better on the fifth metatarsal heads.  Patient does not smoke likes to be active   Review of Systems  All other systems reviewed and are negative.       Objective:  Physical Exam Vitals and nursing note reviewed.  Constitutional:      Appearance: He is well-developed.  Pulmonary:     Effort: Pulmonary effort is normal.  Musculoskeletal:        General: Normal range of motion.  Skin:    General: Skin is warm.  Neurological:     Mental Status: He is alert.     Neurovascular status found to be intact muscle strength found to be adequate range of motion adequate with elongated nailbeds 1-5 both feet that are thick and incurvated in the corners and patient is noted to have lesions bilateral that are on the metatarsal heads but only minimal discomfort     Assessment:  Mycotic nail infections with pain 1-5 both feet that has not been able to take care of along with tailor's bunion deformity creating pressure on the metatarsal heads leading to lesion formation     Plan:  H&P reviewed and at this point I debrided nailbeds 1-5 both feet educated him on the lesions on the outside and these may need to be taken care of and ultimately possible surgery if they were to get bad over time.  Discussed shoe gear modifications patient will be seen back as needed

## 2022-11-11 ENCOUNTER — Telehealth: Payer: Self-pay | Admitting: Orthopedic Surgery

## 2022-11-11 ENCOUNTER — Other Ambulatory Visit: Payer: Self-pay

## 2022-11-11 ENCOUNTER — Encounter: Payer: Self-pay | Admitting: Nurse Practitioner

## 2022-11-11 NOTE — Telephone Encounter (Signed)
metFORMIN (GLUCOPHAGE-XR) 500 MG 24 hr tablet  Last visit 10/28/2022 Next visit Per Matt come back in 4 months.

## 2022-11-11 NOTE — Telephone Encounter (Signed)
Patient called. Says his knee is still painful, shots didn't help. Would like to know what to do next. His call back number is [phone]

## 2022-11-11 NOTE — Telephone Encounter (Signed)
Rx sent to provider 

## 2022-11-11 NOTE — Telephone Encounter (Signed)
I think I'd recommend repeat eval in office.  He has some hip arthritis which may be contributing to his knee pain.  Could consider diagnostic injection

## 2022-11-12 MED ORDER — METFORMIN HCL ER 500 MG PO TB24: 500.0000 mg | ORAL_TABLET | Freq: Every day | ORAL | 0 refills | Status: DC

## 2022-11-12 NOTE — Telephone Encounter (Signed)
Scheduled OV.

## 2022-11-22 ENCOUNTER — Telehealth: Payer: Self-pay | Admitting: Orthopedic Surgery

## 2022-11-22 NOTE — Telephone Encounter (Signed)
Patient called advised the injections he had did not work and he is still in s lot of pain. Patient said he didn't want to keep the appointment and pay additional monies if he's in constant pain. The number to contact patient is [phone]

## 2022-11-22 NOTE — Telephone Encounter (Signed)
I called.  He states that when he had an injection "from the top" that it helped in the brace.  Reviewed the records and he did have a subacromial and injection last year which helped him for 2 months.  Had divided subacromial and glenohumeral joint injection in November which I do not think helped him as much and both of those were done by Dr. Cleophas Dunker.  I did recently gave him ultrasound-guided intra-articular injection in March which she states did not help him.  I think it would be reasonable to try an [facility] joint injection "from the top" and possibly add a subacromial injection as well but we will see.  At least will do the [facility] joint injection.

## 2022-11-24 ENCOUNTER — Ambulatory Visit: Payer: No Typology Code available for payment source | Admitting: Orthopedic Surgery

## 2022-11-25 NOTE — Telephone Encounter (Signed)
IC patient to schedule, no answer. LMVM for patient to return call to schedule. Please schedule AC jt injectiion w/US for 30 min appt should patient return call.

## 2022-12-03 ENCOUNTER — Encounter: Payer: Self-pay | Admitting: Nurse Practitioner

## 2022-12-07 NOTE — Progress Notes (Signed)
Denaja Verhoeven T. Rachna Schonberger, MD, CAQ Sports Medicine [facility] at [facility] [address] [place], [address]  Phone: [phone]  FAX: [phone]  Andrew Bayuk - 77 y.o. male  MRN [id number]  Date of Birth: 29-Jun-1946  Date: 12/08/2022  PCP: Eden Emms, NP  Referral: Eden Emms, NP  Chief Complaint  Patient presents with   Shoulder Pain    Left-Injection done by Dr. August Saucer in March-Didn't Work   Knee Pain    Right-Injected by Dr. August Saucer back in March but patient states it didn't work   Subjective:   Ashtun Paker is a 77 y.o. very pleasant male patient with Body mass index is 27.25 kg/m. who presents with the following:  Patient is here to discuss his shoulder and knee.  It looks like he is recently seen Dr. Cleophas Dunker as well as Dr. August Saucer for both of these.  He had a ultrasound-guided left-sided shoulder injection 6 weeks ago done by Dr. August Saucer.  He has moderate knee osteoarthritis visualized on Dr. Diamantina Providence most recent knee x-rays. He has a left-sided high-grade partial-thickness rotator cuff tear as well as moderate osteoarthritis of the glenohumeral joint.  Not satisfied with Dr. August Saucer - wants some other opinions.   R knee is hurting and swelling up intermittently.    He says he had significant relief from a prior [facility] joint injection done by Dr. Cleophas Dunker, and he is interested in pursuing this today.  [facility] does not want him to get testosterone - but he is on it right now.  R rotator cuff tear - operation by [facility].   Cannot take NSAIDS.   AC joint inj - L  Review of Systems is noted in the HPI, as appropriate  Objective:   BP 130/62 (BP Location: Left Arm, Patient Position: Sitting, Cuff Size: Normal)   Pulse 76   Temp 98.1 F (36.7 C) (Temporal)   Ht 5\' 7"  (1.702 m)   Wt 174 lb (78.9 kg)   SpO2 98%   BMI 27.25 kg/m   GEN: No acute distress; alert,appropriate. PULM: Breathing comfortably in no respiratory  distress PSYCH: Normally interactive.   Left shoulder: Nontender along the clavicle.  Significant AC joint tenderness as well as tenderness in the bicipital groove. He is able to actively elevate his shoulder to 125 degrees.  Actively flex his shoulder to 140.  He is able to lift this above this with significant momentum and recruitment of the trapezius and parascapular musculature, however full motion is not achieved.  Passive full motion is available.  Positive drop Test Strength is 3/5 in abduction Strength is 3/5 in flexion External range of motion is 4/5 Internal range of motion is 5/5  Crossover positive. All impingement tests are positive and equivocal given loss of motion and strength  Right knee: Mild effusion.  Patient lacks 2 degrees of extension.  Flexion to 115. Stable to varus and valgus stress.  He does have medial and lateral joint line tenderness. Minimal pain with loading the medial lateral patellar facets. Flexion pinch as well as McMurray's test are positive, but no mechanical symptoms. Some pain with bounce home testing, as well  Laboratory and Imaging Data:  Assessment and Plan:     ICD-10-CM   1. Left rotator cuff tear arthropathy  M75.102 Ambulatory referral to Orthopedic Surgery   M12.812     2. Primary osteoarthritis of right knee  M17.11     3. Glenohumeral arthritis, left  M19.012  Ambulatory referral to Orthopedic Surgery    4. Chronic left shoulder pain  M25.512 Ambulatory referral to Orthopedic Surgery   G89.29     5. Arthritis of left acromioclavicular joint  M19.012 triamcinolone acetonide (KENALOG-40) injection 20 mg     Left-sided high-grade partial-thickness rotator cuff tear with also some significant glenohumeral joint osteoarthritis.  He is doing very poorly.  Minimal use of the shoulder in abduction and flexion.  He has talked about surgery with 1 surgeon, however he did not have a good rapport, and he would like to talk with another  surgeon for second opinion.  [facility] did his prior right-sided rotator cuff repair, so I am going to rereferred him to their shoulder service.  He had had good relief of symptoms previously from an [facility] joint injection, so I think that is reasonable.  He just had an intra-articular injection with limited help.  Right knee pain and osteoarthropathy.  Continue with conservative care.  Aspiration/Injection Procedure Note Burgess Windon 1946/03/30 Date of procedure: 12/08/2022  Procedure: Small Joint Aspiration / Injection of AC joint, L Indications: Pain  Procedure Details Verbal consent was obtained from the patient. Risks, benefits, and alternatives have been reviewed. The patient was prepped with Chloraprep. Ethyl Chloride used for anesthesia. Under sterile conditions, 1/2 cc of Lidocaine 1% and Kenalog 20 mg directly injected into at the superior-lateral AC joint. The patient tolerated the procedure well and had decreased symptoms after injection. No complications.  Medication: 1/2 cc of Kenalog 40 mg (equaling Kenalog 20 mg)   Medication Management during today's office visit: Meds ordered this encounter  Medications   triamcinolone acetonide (KENALOG-40) injection 20 mg   There are no discontinued medications.  Orders placed today for conditions managed today: Orders Placed This Encounter  Procedures   Ambulatory referral to Orthopedic Surgery    Disposition: No follow-ups on file.  Dragon [employer] One speech-to-text software was used for transcription in this dictation.  Possible transcriptional errors can occur using Animal nutritionist.   Signed,  Elpidio Galea. Aiana Nordquist, MD   Outpatient Encounter Medications as of 12/08/2022  Medication Sig   amLODipine (NORVASC) 5 MG tablet Take 1 tablet (5 mg total) by mouth daily.   aspirin 81 MG chewable tablet Chew 81 mg by mouth daily.   atorvastatin (LIPITOR) 80 MG tablet TAKE 1 TABLET BY MOUTH DAILY   CALCIUM-VITAMIN D PO  Take by mouth. Calcium 600 mg and vitamin d 400 mg   clobetasol (TEMOVATE) 0.05 % external solution Apply 1 application topically 2 (two) times daily. Apply to affected areas on scalp until itchy rash clears/ PRN   enalapril (VASOTEC) 20 MG tablet TAKE 2 TABLETS BY MOUTH ONCE  DAILY   Fluocinolone Acetonide Body 0.01 % OIL Apply to scalp, face and ears QD to BID (Patient taking differently: Apply 1 application  topically daily as needed (irritation).)   hydrochlorothiazide (HYDRODIURIL) 12.5 MG tablet Take 1 tablet (12.5 mg total) by mouth daily.   Iron, Ferrous Sulfate, 325 (65 Fe) MG TABS Take 325 mg by mouth daily.   ketoconazole (NIZORAL) 2 % shampoo APPLY THREE TIMES WEEKLY, LET SIT SEVERAL MINUTES BEFORE RINSING. (Patient taking differently: Apply 1 application  topically as needed for irritation.)   metFORMIN (GLUCOPHAGE-XR) 500 MG 24 hr tablet Take 1 tablet (500 mg total) by mouth daily with breakfast.   Oxycodone HCl 10 MG TABS Take by mouth every 4 (four) hours as needed for severe pain. Takes 5 to 10 mg at a  time as needed   propranolol (INDERAL) 20 MG tablet Take 1 tablet by mouth 2 (two) times daily.   sildenafil (VIAGRA) 100 MG tablet Take 50-100 mg by mouth daily as needed for erectile dysfunction.   Specialty Vitamins Products (PROSTATE PO) Take 1 capsule by mouth at bedtime.   tamsulosin (FLOMAX) 0.4 MG CAPS capsule TAKE 2 CAPSULES BY MOUTH DAILY   testosterone cypionate (DEPOTESTOSTERONE CYPIONATE) 200 MG/ML injection Inject 1 mL into the muscle every 14 (fourteen) days.   tobramycin-dexamethasone (TOBRADEX) ophthalmic solution every 4 (four) hours while awake.   traZODone (DESYREL) 50 MG tablet Take 1 tablet (50 mg total) by mouth at bedtime as needed. for sleep   TURMERIC PO Take 1 capsule by mouth daily.   potassium chloride SA (KLOR-CON M) 20 MEQ tablet Take 2 tablets (40 mEq total) by mouth once for 1 dose.   [EXPIRED] triamcinolone acetonide (KENALOG-40) injection 20 mg     No facility-administered encounter medications on file as of 12/08/2022.

## 2022-12-08 ENCOUNTER — Ambulatory Visit (INDEPENDENT_AMBULATORY_CARE_PROVIDER_SITE_OTHER): Payer: Medicare Other | Admitting: Family Medicine

## 2022-12-08 ENCOUNTER — Encounter: Payer: Self-pay | Admitting: Family Medicine

## 2022-12-08 VITALS — BP 130/62 | HR 76 | Temp 98.1°F | Ht 67.0 in | Wt 174.0 lb

## 2022-12-08 DIAGNOSIS — M1711 Unilateral primary osteoarthritis, right knee: Secondary | ICD-10-CM

## 2022-12-08 DIAGNOSIS — G8929 Other chronic pain: Secondary | ICD-10-CM

## 2022-12-08 DIAGNOSIS — M19012 Primary osteoarthritis, left shoulder: Secondary | ICD-10-CM | POA: Diagnosis not present

## 2022-12-08 DIAGNOSIS — M25512 Pain in left shoulder: Secondary | ICD-10-CM

## 2022-12-08 DIAGNOSIS — M12812 Other specific arthropathies, not elsewhere classified, left shoulder: Secondary | ICD-10-CM

## 2022-12-08 DIAGNOSIS — M75102 Unspecified rotator cuff tear or rupture of left shoulder, not specified as traumatic: Secondary | ICD-10-CM

## 2022-12-08 MED ORDER — TRIAMCINOLONE ACETONIDE 40 MG/ML IJ SUSP
20.0000 mg | Freq: Once | INTRAMUSCULAR | Status: AC
Start: 2022-12-08 — End: 2022-12-08
  Administered 2022-12-08: 20 mg via INTRA_ARTICULAR

## 2022-12-08 NOTE — Patient Instructions (Signed)
I sent a referral to [facility] who operated on your RIGHT shoulder.

## 2022-12-10 ENCOUNTER — Encounter: Payer: Self-pay | Admitting: *Deleted

## 2022-12-23 ENCOUNTER — Other Ambulatory Visit: Payer: Self-pay | Admitting: Nurse Practitioner

## 2022-12-23 DIAGNOSIS — E7800 Pure hypercholesterolemia, unspecified: Secondary | ICD-10-CM

## 2022-12-23 DIAGNOSIS — M25512 Pain in left shoulder: Secondary | ICD-10-CM | POA: Diagnosis not present

## 2023-01-04 LAB — AMB RESULTS CONSOLE CBG: Glucose: 112

## 2023-01-04 NOTE — Progress Notes (Signed)
Pt has PCP in town and goes to the [place] hospital. Asked pt SDOH questions verbally, no SDOH needs at this time. Pt given lifestyle medicine handout

## 2023-01-11 ENCOUNTER — Encounter (HOSPITAL_COMMUNITY): Payer: Self-pay

## 2023-01-11 NOTE — Progress Notes (Signed)
Please place orders for PAT appointment scheduled 01/12/23. 

## 2023-01-12 ENCOUNTER — Encounter (HOSPITAL_COMMUNITY)
Admission: RE | Admit: 2023-01-12 | Discharge: 2023-01-12 | Disposition: A | Discharge status: Home / Self Care | Payer: No Typology Code available for payment source | Source: Ambulatory Visit | Attending: Orthopedic Surgery | Admitting: Orthopedic Surgery

## 2023-01-12 ENCOUNTER — Other Ambulatory Visit: Payer: Self-pay

## 2023-01-12 ENCOUNTER — Encounter (HOSPITAL_COMMUNITY): Payer: Self-pay

## 2023-01-12 ENCOUNTER — Telehealth: Payer: Self-pay | Admitting: Nurse Practitioner

## 2023-01-12 VITALS — BP 154/71 | HR 65 | Resp 14 | Ht 68.0 in | Wt 169.0 lb

## 2023-01-12 DIAGNOSIS — E119 Type 2 diabetes mellitus without complications: Secondary | ICD-10-CM | POA: Insufficient documentation

## 2023-01-12 DIAGNOSIS — Z01818 Encounter for other preprocedural examination: Secondary | ICD-10-CM | POA: Insufficient documentation

## 2023-01-12 HISTORY — DX: Type 2 diabetes mellitus without complications: E11.9

## 2023-01-12 LAB — BASIC METABOLIC PANEL
Anion gap: 10 (ref 5–15)
BUN: 15 mg/dL (ref 8–23)
CO2: 29 mmol/L (ref 22–32)
Calcium: 9.2 mg/dL (ref 8.9–10.3)
Chloride: 100 mmol/L (ref 98–111)
Creatinine, Ser: 0.99 mg/dL (ref 0.61–1.24)
GFR, Estimated: >60 mL/min (ref >60)
Glucose, Bld: 119 mg/dL — ABNORMAL HIGH (ref 70–99)
Potassium: 3.7 mmol/L (ref 3.5–5.1)
Sodium: 139 mmol/L (ref 135–145)

## 2023-01-12 LAB — CBC
HCT: 48.8 % (ref 39.0–52.0)
Hemoglobin: 16.2 g/dL (ref 13.0–17.0)
MCH: 30.6 pg (ref 26.0–34.0)
MCHC: 33.2 g/dL (ref 30.0–36.0)
MCV: 92.2 fL (ref 80.0–100.0)
Platelets: 135 10*3/uL — ABNORMAL LOW (ref 150–400)
RBC: 5.29 MIL/uL (ref 4.22–5.81)
RDW: 12.8 % (ref 11.5–15.5)
WBC: 6 10*3/uL (ref 4.0–10.5)
nRBC: 0 % (ref 0.0–0.2)

## 2023-01-12 LAB — GLUCOSE, CAPILLARY: Glucose-Capillary: 134 mg/dL — ABNORMAL HIGH (ref 70–99)

## 2023-01-12 LAB — SURGICAL PCR SCREEN
MRSA, PCR: NEGATIVE
Staphylococcus aureus: NEGATIVE

## 2023-01-12 NOTE — Patient Instructions (Addendum)
SURGICAL WAITING ROOM VISITATION  Patients having surgery or a procedure may have no more than 2 support people in the waiting area - these visitors may rotate.    Children under the age of 62 must have an adult with them who is not the patient.  Due to an increase in RSV and influenza rates and associated hospitalizations, children ages 83 and under may not visit patients in [facility] hospitals.  If the patient needs to stay at the hospital during part of their recovery, the visitor guidelines for inpatient rooms apply. Pre-op nurse will coordinate an appropriate time for 1 support person to accompany patient in pre-op.  This support person may not rotate.    Please refer to the [facility] website for the visitor guidelines for Inpatients (after your surgery is over and you are in a regular room).    Your procedure is scheduled on: 01/27/23   Report to [facility] Main Entrance    Report to admitting at 10:30 AM   Call this number if you have problems the morning of surgery [phone]   Do not eat food :After Midnight.   After Midnight you may have the following liquids until 10:00 AM DAY OF SURGERY  Water Non-Citrus Juices (without pulp, NO RED-Apple, White grape, White cranberry) Black Coffee (NO MILK/CREAM OR CREAMERS, sugar ok)  Clear Tea (NO MILK/CREAM OR CREAMERS, sugar ok) regular and decaf                             Plain Jell-O (NO RED)                                           Fruit ices (not with fruit pulp, NO RED)                                     Popsicles (NO RED)                                                               Sports drinks like Gatorade (NO RED)                   The day of surgery:  Drink ONE (1) Pre-Surgery G2 at 10:00 AM the morning of surgery. Drink in one sitting. Do not sip.  This drink was given to you during your hospital  pre-op appointment visit. Nothing else to drink after completing the  Pre-Surgery G2.          If  you have questions, please contact your surgeon's office.   FOLLOW BOWEL PREP AND ANY ADDITIONAL PRE OP INSTRUCTIONS YOU RECEIVED FROM YOUR SURGEON'S OFFICE!!!     Oral Hygiene is also important to reduce your risk of infection.                                    Remember - BRUSH YOUR TEETH THE MORNING OF SURGERY WITH YOUR REGULAR TOOTHPASTE  DENTURES WILL  BE REMOVED PRIOR TO SURGERY PLEASE DO NOT APPLY "Poly grip" OR ADHESIVES!!!   Take these medicines the morning of surgery with A SIP OF WATER: Amlodipine, Oxycodone    DO NOT TAKE ANY ORAL DIABETIC MEDICATIONS DAY OF YOUR SURGERY  How to Manage Your Diabetes Before and After Surgery  Why is it important to control my blood sugar before and after surgery? Improving blood sugar levels before and after surgery helps healing and can limit problems. A way of improving blood sugar control is eating a healthy diet by:  Eating less sugar and carbohydrates  Increasing activity/exercise  Talking with your doctor about reaching your blood sugar goals High blood sugars (greater than 180 mg/dL) can raise your risk of infections and slow your recovery, so you will need to focus on controlling your diabetes during the weeks before surgery. Make sure that the doctor who takes care of your diabetes knows about your planned surgery including the date and location.  How do I manage my blood sugar before surgery? Check your blood sugar at least 4 times a day, starting 2 days before surgery, to make sure that the level is not too high or low. Check your blood sugar the morning of your surgery when you wake up and every 2 hours until you get to the Short Stay unit. If your blood sugar is less than 70 mg/dL, you will need to treat for low blood sugar: Do not take insulin. Treat a low blood sugar (less than 70 mg/dL) with  cup of clear juice (cranberry or apple), 4 glucose tablets, OR glucose gel. Recheck blood sugar in 15 minutes after treatment (to  make sure it is greater than 70 mg/dL). If your blood sugar is not greater than 70 mg/dL on recheck, call [phone] for further instructions. Report your blood sugar to the short stay nurse when you get to Short Stay.  If you are admitted to the hospital after surgery: Your blood sugar will be checked by the staff and you will probably be given insulin after surgery (instead of oral diabetes medicines) to make sure you have good blood sugar levels. The goal for blood sugar control after surgery is 80-180 mg/dL.   WHAT DO I DO ABOUT MY DIABETES MEDICATION?  Do not take oral diabetes medicines (pills) the morning of surgery.  THE DAY BEFORE SURGERY, take Metformin as prescribed.      THE MORNING OF SURGERY, do not take Metformin   Reviewed and Endorsed by [facility] Patient Education Committee, August 2015                              You may not have any metal on your body including jewelry, and body piercing             Do not wear lotions, powders, cologne, or deodorant              Men may shave face and neck.   Do not bring valuables to the hospital. [facility] IS NOT             RESPONSIBLE   FOR VALUABLES.   Contacts, glasses, dentures or bridgework may not be worn into surgery.  DO NOT BRING YOUR HOME MEDICATIONS TO THE HOSPITAL. PHARMACY WILL DISPENSE MEDICATIONS LISTED ON YOUR MEDICATION LIST TO YOU DURING YOUR ADMISSION IN THE HOSPITAL!    Patients discharged on the day of surgery will not be  allowed to drive home.  Someone NEEDS to stay with you for the first 24 hours after anesthesia.              Please read over the following fact sheets you were given: IF YOU HAVE QUESTIONS ABOUT YOUR PRE-OP INSTRUCTIONS PLEASE CALL [phone] Contras     Pre-operative 5 CHG Bath Instructions   You can play a key role in reducing the risk of infection after surgery. Your skin needs to be as free of germs as possible. You can reduce the number of germs on your skin by  washing with CHG (chlorhexidine gluconate) soap before surgery. CHG is an antiseptic soap that kills germs and continues to kill germs even after washing.   DO NOT use if you have an allergy to chlorhexidine/CHG or antibacterial soaps. If your skin becomes reddened or irritated, stop using the CHG and notify one of our RNs at [phone].   Please shower with the CHG soap starting 4 days before surgery using the following schedule:     Please keep in mind the following:  DO NOT shave, including legs and underarms, starting the day of your first shower.   You may shave your face at any point before/day of surgery.  Place clean sheets on your bed the day you start using CHG soap. Use a clean washcloth (not used since being washed) for each shower. DO NOT sleep with pets once you start using the CHG.   CHG Shower Instructions:  If you choose to wash your hair and private area, wash first with your normal shampoo/soap.  After you use shampoo/soap, rinse your hair and body thoroughly to remove shampoo/soap residue.  Turn the water OFF and apply about 3 tablespoons (45 ml) of CHG soap to a CLEAN washcloth.  Apply CHG soap ONLY FROM YOUR NECK DOWN TO YOUR TOES (washing for 3-5 minutes)  DO NOT use CHG soap on face, private areas, open wounds, or sores.  Pay special attention to the area where your surgery is being performed.  If you are having back surgery, having someone wash your back for you may be helpful. Wait 2 minutes after CHG soap is applied, then you may rinse off the CHG soap.  Pat dry with a clean towel  Put on clean clothes/pajamas   If you choose to wear lotion, please use ONLY the CHG-compatible lotions on the back of this paper.     Additional instructions for the day of surgery: DO NOT APPLY any lotions, deodorants, cologne, or perfumes.   Put on clean/comfortable clothes.  Brush your teeth.  Ask your nurse before applying any prescription medications to the  skin.      CHG Compatible Lotions   Aveeno Moisturizing lotion  Cetaphil Moisturizing Cream  Cetaphil Moisturizing Lotion  Clairol Herbal Essence Moisturizing Lotion, Dry Skin  Clairol Herbal Essence Moisturizing Lotion, Extra Dry Skin  Clairol Herbal Essence Moisturizing Lotion, Normal Skin  Curel Age Defying Therapeutic Moisturizing Lotion with Alpha Hydroxy  Curel Extreme Care Body Lotion  Curel Soothing Hands Moisturizing Hand Lotion  Curel Therapeutic Moisturizing Cream, Fragrance-Free  Curel Therapeutic Moisturizing Lotion, Fragrance-Free  Curel Therapeutic Moisturizing Lotion, Original Formula  Eucerin Daily Replenishing Lotion  Eucerin Dry Skin Therapy Plus Alpha Hydroxy Crme  Eucerin Dry Skin Therapy Plus Alpha Hydroxy Lotion  Eucerin Original Crme  Eucerin Original Lotion  Eucerin Plus Crme Eucerin Plus Lotion  Eucerin TriLipid Replenishing Lotion  Keri Anti-Bacterial Hand Lotion  Keri Deep Conditioning Original Lotion  Dry Skin Formula Softly Scented  Keri Deep Conditioning Original Lotion, Fragrance Free Sensitive Skin Formula  Keri Lotion Fast Absorbing Fragrance Free Sensitive Skin Formula  Keri Lotion Fast Absorbing Softly Scented Dry Skin Formula  Keri Original Lotion  Keri Skin Renewal Lotion Keri Silky Smooth Lotion  Keri Silky Smooth Sensitive Skin Lotion  Nivea Body Creamy Conditioning Oil  Nivea Body Extra Enriched Lotion  Nivea Body Original Lotion  Nivea Body Sheer Moisturizing Lotion Nivea Crme  Nivea Skin Firming Lotion  NutraDerm 30 Skin Lotion  NutraDerm Skin Lotion  NutraDerm Therapeutic Skin Cream  NutraDerm Therapeutic Skin Lotion  ProShield Protective Hand Cream  Provon moisturizing lotion   Incentive Spirometer  An incentive spirometer is a tool that can help keep your lungs clear and active. This tool measures how well you are filling your lungs with each breath. Taking long deep breaths may help reverse or decrease the chance of  developing breathing (pulmonary) problems (especially infection) following: A long period of time when you are unable to move or be active. BEFORE THE PROCEDURE  If the spirometer includes an indicator to show your best effort, your nurse or respiratory therapist will set it to a desired goal. If possible, sit up straight or lean slightly forward. Try not to slouch. Hold the incentive spirometer in an upright position. INSTRUCTIONS FOR USE  Sit on the edge of your bed if possible, or sit up as far as you can in bed or on a chair. Hold the incentive spirometer in an upright position. Breathe out normally. Place the mouthpiece in your mouth and seal your lips tightly around it. Breathe in slowly and as deeply as possible, raising the piston or the ball toward the top of the column. Hold your breath for 3-5 seconds or for as long as possible. Allow the piston or ball to fall to the bottom of the column. Remove the mouthpiece from your mouth and breathe out normally. Rest for a few seconds and repeat Steps 1 through 7 at least 10 times every 1-2 hours when you are awake. Take your time and take a few normal breaths between deep breaths. The spirometer may include an indicator to show your best effort. Use the indicator as a goal to work toward during each repetition. After each set of 10 deep breaths, practice coughing to be sure your lungs are clear. If you have an incision (the cut made at the time of surgery), support your incision when coughing by placing a pillow or rolled up towels firmly against it. Once you are able to get out of bed, walk around indoors and cough well. You may stop using the incentive spirometer when instructed by your caregiver.  RISKS AND COMPLICATIONS Take your time so you do not get dizzy or light-headed. If you are in pain, you may need to take or ask for pain medication before doing incentive spirometry. It is harder to take a deep breath if you are having pain. AFTER  USE Rest and breathe slowly and easily. It can be helpful to keep track of a log of your progress. Your caregiver can provide you with a simple table to help with this. If you are using the spirometer at home, follow these instructions: SEEK MEDICAL CARE IF:  You are having difficultly using the spirometer. You have trouble using the spirometer as often as instructed. Your pain medication is not giving enough relief while using the spirometer. You develop fever of 100.5 F (38.1 C) or  higher. SEEK IMMEDIATE MEDICAL CARE IF:  You cough up bloody sputum that had not been present before. You develop fever of 102 F (38.9 C) or greater. You develop worsening pain at or near the incision site. MAKE SURE YOU:  Understand these instructions. Will watch your condition. Will get help right away if you are not doing well or get worse. Document Released: 12/06/2006 Document Revised: 10/18/2011 Document Reviewed: 02/06/2007 ExitCare Patient Information 2014 [place] Downer.   ________________________________________________________________________ [facility]- Preparing for Total Shoulder Arthroplasty    Before surgery, you can play an important role. Because skin is not sterile, your skin needs to be as free of germs as possible. You can reduce the number of germs on your skin by using the following products. Benzoyl Peroxide Gel Reduces the number of germs present on the skin Applied twice a day to shoulder area starting two days before surgery    ==================================================================  Please follow these instructions carefully:  BENZOYL PEROXIDE 5% GEL  Please do not use if you have an allergy to benzoyl peroxide.   If your skin becomes reddened/irritated stop using the benzoyl peroxide.  Starting two days before surgery, apply as follows: Apply benzoyl peroxide in the morning and at night. Apply after taking a shower. If you are not taking a shower clean  entire shoulder front, back, and side along with the armpit with a clean wet washcloth.  Place a quarter-sized dollop on your shoulder and rub in thoroughly, making sure to cover the front, back, and side of your shoulder, along with the armpit.   2 days before ____ AM   ____ PM              1 day before ____ AM   ____ PM                         Do this twice a day for two days.  (Last application is the night before surgery, AFTER using the CHG soap as described below).  Do NOT apply benzoyl peroxide gel on the day of surgery.

## 2023-01-12 NOTE — Telephone Encounter (Signed)
Got notice that he is going to have a left reverse shoulder replacement. He needs an office visit for surgical clearance

## 2023-01-12 NOTE — Progress Notes (Addendum)
COVID Vaccine Completed: yes  Date of COVID positive in last 90 days: no  PCP - Mordecai Maes, NP Cardiologist - n/a  Chest x-ray - 04/22/22 Epic EKG - 06/23/22 Epic Stress Test - more than 10 years ago per pt ECHO - yes per pt years ago Cardiac Cath - n/a Pacemaker/ICD device last checked: n/a Spinal Cord Stimulator: n/a  Bowel Prep - no  Sleep Study - yes, need to be re done CPAP -   Fasting Blood Sugar - 110-127 Checks Blood Sugar  every other day  Last dose of GLP1 agonist-  N/A GLP1 instructions:  N/A   Last dose of SGLT-2 inhibitors-  N/A SGLT-2 instructions: N/A   Blood Thinner Instructions:  Time Aspirin Instructions: ASA 81, hold 7 days prior Last Dose:  Activity level: Can go up a flight of stairs and perform activities of daily living without stopping and without symptoms of chest pain or shortness of breath.   Anesthesia review: HTN, stroke, DM2, parkinson's?, lung nodule  Patient denies shortness of breath, fever, cough and chest pain at PAT appointment  Patient verbalized understanding of instructions that were given to them at the PAT appointment. Patient was also instructed that they will need to review over the PAT instructions again at home before surgery.

## 2023-01-12 NOTE — Telephone Encounter (Signed)
Pt scheduled for 01/19/2023 @ 3:20

## 2023-01-13 ENCOUNTER — Other Ambulatory Visit: Payer: Self-pay | Admitting: Nurse Practitioner

## 2023-01-13 LAB — HEMOGLOBIN A1C
Hgb A1c MFr Bld: 6.2 % — ABNORMAL HIGH (ref 4.8–5.6)
Mean Plasma Glucose: 131 mg/dL

## 2023-01-17 DIAGNOSIS — M1711 Unilateral primary osteoarthritis, right knee: Secondary | ICD-10-CM | POA: Diagnosis not present

## 2023-01-19 ENCOUNTER — Encounter: Payer: Self-pay | Admitting: Nurse Practitioner

## 2023-01-19 ENCOUNTER — Ambulatory Visit: Payer: Medicare Other | Admitting: Nurse Practitioner

## 2023-01-19 VITALS — BP 146/70 | HR 73 | Temp 97.6°F | Resp 16 | Ht 68.0 in | Wt 171.0 lb

## 2023-01-19 DIAGNOSIS — Z7984 Long term (current) use of oral hypoglycemic drugs: Secondary | ICD-10-CM

## 2023-01-19 DIAGNOSIS — Z01818 Encounter for other preprocedural examination: Secondary | ICD-10-CM | POA: Diagnosis not present

## 2023-01-19 DIAGNOSIS — I10 Essential (primary) hypertension: Secondary | ICD-10-CM

## 2023-01-19 DIAGNOSIS — E119 Type 2 diabetes mellitus without complications: Secondary | ICD-10-CM | POA: Diagnosis not present

## 2023-01-19 NOTE — Assessment & Plan Note (Signed)
ACS surgical calculator used an EKG performed in office.  Patient is cleared for surgery will fill out form and fax it to orthopedist office.

## 2023-01-19 NOTE — Patient Instructions (Addendum)
Nice to see you today  I am going to clear you for surgery I want to see you in approx 3 months for a Diabetes recheck and your physical   Good luck on your surgery

## 2023-01-19 NOTE — Progress Notes (Signed)
   Established Patient Office Visit  Subjective   Patient ID: James Pearson, male    DOB: October 01, 1945  Age: 77 y.o. MRN: [id number]  Chief Complaint  Patient presents with   Medical Clearance    Surgery for left shoulder surgery    HPI  Pre-operative clearance: patient is to have a left reverse shoulder replacement with James Hanly, MD at [facility]. They will be using general anaesthesia. Patient has a hx of some on the right shoulder approx 10 years ago    Patient does have a history of HTN, stroke, DM2,and HLD. Patient has had a pre-op appt through the hospital where they checked patients A1C and basic labs   Anesthesia: no trouble with medication and no history of malignant hyperthermia. No trouble with n/v from the anesthesia   Does not have a cardiologist and has not seen one. Patient does go to the gym and has no trouble with shortness of breath or any chest pain    Review of Systems  Constitutional:  Negative for chills and fever.  Respiratory:  Negative for shortness of breath.   Cardiovascular:  Negative for chest pain.  Neurological:  Negative for headaches.      Objective:     BP (!) 146/70   Pulse 73   Temp 97.6 F (36.4 C)   Resp 16   Ht 5\' 8"  (1.727 m)   Wt 171 lb (77.6 kg)   SpO2 99%   BMI 26.00 kg/m    Physical Exam Vitals and nursing note reviewed.  Constitutional:      Appearance: Normal appearance.  Cardiovascular:     Rate and Rhythm: Normal rate and regular rhythm.     Heart sounds: Normal heart sounds.  Pulmonary:     Effort: Pulmonary effort is normal.     Breath sounds: Normal breath sounds.  Neurological:     Mental Status: He is alert.      No results found for any visits on 01/19/23.    The ASCVD Risk score (Arnett DK, et al., 2019) failed to calculate for the following reasons:   The patient has a prior MI or stroke diagnosis    Assessment & Plan:   Problem List Items Addressed This Visit       Cardiovascular  and Mediastinum   Essential hypertension    Patient currently maintained on antihypertensives inclusive of enalapril 40 mg, amlodipine 5 mg, hydrochlorothiazide 12.5 mg.  Blood pressure slightly above goal today.  Continue medication as prescribed      Relevant Orders   EKG 12-Lead (Completed)     Endocrine   Type 2 diabetes mellitus without complication (HCC)    Currently maintained on metformin.  Patient's A1c was checked through preoperative clearance through his surgeon and was 6.2%.  Continue medication as prescribed        Other   Preoperative clearance - Primary    ACS surgical calculator used an EKG performed in office.  Patient is cleared for surgery will fill out form and fax it to orthopedist office.      Relevant Orders   EKG 12-Lead (Completed)    Return in about 3 months (around 04/21/2023) for DM recheck, CPE and Labs.    Audria Nine, NP

## 2023-01-19 NOTE — Assessment & Plan Note (Signed)
Patient currently maintained on antihypertensives inclusive of enalapril 40 mg, amlodipine 5 mg, hydrochlorothiazide 12.5 mg.  Blood pressure slightly above goal today.  Continue medication as prescribed

## 2023-01-19 NOTE — Assessment & Plan Note (Signed)
Currently maintained on metformin.  Patient's A1c was checked through preoperative clearance through his surgeon and was 6.2%.  Continue medication as prescribed

## 2023-01-21 NOTE — Progress Notes (Signed)
Anesthesia Chart Review   Case: [id number] Date/Time: 01/27/23 1245   Procedure: REVERSE SHOULDER ARTHROPLASTY (Left: Shoulder) -   Anesthesia type: General   Pre-op diagnosis: Left shoulder rotator cuff tear arthropathy   Location: [facility] ROOM 06 / [facility]   Surgeons: Francena Hanly, MD       DISCUSSION:77 y.o. former smoker with h/o Parkinson's diease, HTN, stroke, DM II, left shoulder OA scheduled for above procedure 01/27/2023 with Dr. Francena Hanly.   Pt seen by PCP 01/19/2023 for preoperative evaluation. Per OV note pt cleared for surgery.  VS: There were no vitals taken for this visit.  PROVIDERS: Eden Emms, NP is PCP    LABS: Labs reviewed: Acceptable for surgery. 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    amLODipine (NORVASC) 5 MG tablet   aspirin 81 MG chewable tablet   atorvastatin (LIPITOR) 80 MG tablet   BENFOTIAMINE PO   CALCIUM-VITAMIN D PO   clobetasol (TEMOVATE) 0.05 % external solution   docusate sodium (COLACE) 100 MG capsule   enalapril (VASOTEC) 20 MG tablet   Fluocinolone Acetonide Body 0.01 % OIL   hydrochlorothiazide (HYDRODIURIL) 12.5 MG tablet   Iron, Ferrous Sulfate, 325 (65 Fe) MG TABS   ketoconazole (NIZORAL) 2 % shampoo   Multiple Vitamins-Minerals (MULTIVITAMIN WITH MINERALS) tablet   oxyCODONE (OXY IR/ROXICODONE) 5 MG immediate release tablet    sildenafil (VIAGRA) 100 MG tablet   Specialty Vitamins Products (PROSTATE PO)   tamsulosin (FLOMAX) 0.4 MG CAPS capsule   testosterone cypionate (DEPOTESTOSTERONE CYPIONATE) 200 MG/ML injection   tobramycin-dexamethasone (TOBRADEX) ophthalmic solution   traZODone (DESYREL) 50 MG tablet   TURMERIC PO   metFORMIN (GLUCOPHAGE-XR) 500 MG 24 hr tablet   potassium chloride SA (KLOR-CON M) 20 MEQ tablet     [facility] Ward, PA-C [facility] Pre-Surgical Testing [phone]

## 2023-01-24 NOTE — Progress Notes (Signed)
Pt attended screening event on 01/04/2023, where screening results were BP 131/73 and Blood sugar 112. At the event, Pt shared he has PCP in town and goes to [place] [facility]. Pt was asked SDOH questions verbally and no SDOH insecurities was indicated. Pt was give lifestyle medicine handout. Per chart review, Eden Emms, NP is listed as Pt's PCP in [facility] Pt was recently seen by PCP on 01/19/23. Pt has upcoming orthopedics surgery on 01/27/23. No additional health equity support indicated at this time.

## 2023-01-25 ENCOUNTER — Encounter: Payer: Self-pay | Admitting: Nurse Practitioner

## 2023-01-26 MED ORDER — METFORMIN HCL ER 500 MG PO TB24: 500.0000 mg | ORAL_TABLET | Freq: Every day | ORAL | 0 refills | Status: DC

## 2023-01-27 ENCOUNTER — Encounter (HOSPITAL_COMMUNITY)
Admission: RE | Disposition: A | Discharge status: Home / Self Care | Payer: Self-pay | Source: Ambulatory Visit | Attending: Orthopedic Surgery

## 2023-01-27 ENCOUNTER — Ambulatory Visit (HOSPITAL_COMMUNITY)
Admission: RE | Admit: 2023-01-27 | Discharge: 2023-01-27 | Disposition: A | Discharge status: Home / Self Care | Payer: No Typology Code available for payment source | Source: Ambulatory Visit | Attending: Orthopedic Surgery | Admitting: Orthopedic Surgery

## 2023-01-27 ENCOUNTER — Other Ambulatory Visit: Payer: Self-pay

## 2023-01-27 ENCOUNTER — Ambulatory Visit (HOSPITAL_COMMUNITY): Payer: No Typology Code available for payment source | Admitting: Physician Assistant

## 2023-01-27 ENCOUNTER — Encounter (HOSPITAL_COMMUNITY): Payer: Self-pay | Admitting: Orthopedic Surgery

## 2023-01-27 ENCOUNTER — Ambulatory Visit (HOSPITAL_BASED_OUTPATIENT_CLINIC_OR_DEPARTMENT_OTHER): Payer: No Typology Code available for payment source | Admitting: Physician Assistant

## 2023-01-27 DIAGNOSIS — M12812 Other specific arthropathies, not elsewhere classified, left shoulder: Secondary | ICD-10-CM | POA: Diagnosis not present

## 2023-01-27 DIAGNOSIS — I10 Essential (primary) hypertension: Secondary | ICD-10-CM | POA: Diagnosis not present

## 2023-01-27 DIAGNOSIS — E119 Type 2 diabetes mellitus without complications: Secondary | ICD-10-CM | POA: Diagnosis not present

## 2023-01-27 DIAGNOSIS — Z8673 Personal history of transient ischemic attack (TIA), and cerebral infarction without residual deficits: Secondary | ICD-10-CM | POA: Diagnosis not present

## 2023-01-27 DIAGNOSIS — G20A1 Parkinson's disease without dyskinesia, without mention of fluctuations: Secondary | ICD-10-CM | POA: Diagnosis not present

## 2023-01-27 DIAGNOSIS — Z87891 Personal history of nicotine dependence: Secondary | ICD-10-CM

## 2023-01-27 DIAGNOSIS — Z7984 Long term (current) use of oral hypoglycemic drugs: Secondary | ICD-10-CM | POA: Insufficient documentation

## 2023-01-27 DIAGNOSIS — M75102 Unspecified rotator cuff tear or rupture of left shoulder, not specified as traumatic: Secondary | ICD-10-CM | POA: Insufficient documentation

## 2023-01-27 DIAGNOSIS — E785 Hyperlipidemia, unspecified: Secondary | ICD-10-CM | POA: Diagnosis not present

## 2023-01-27 DIAGNOSIS — M19012 Primary osteoarthritis, left shoulder: Secondary | ICD-10-CM | POA: Insufficient documentation

## 2023-01-27 HISTORY — PX: REVERSE SHOULDER ARTHROPLASTY: SHX5054

## 2023-01-27 LAB — GLUCOSE, CAPILLARY
Glucose-Capillary: 129 mg/dL — ABNORMAL HIGH (ref 70–99)
Glucose-Capillary: 123 mg/dL — ABNORMAL HIGH (ref 70–99)

## 2023-01-27 SURGERY — ARTHROPLASTY, SHOULDER, TOTAL, REVERSE
Anesthesia: General | Site: Shoulder | Laterality: Left

## 2023-01-27 MED ORDER — HYDROMORPHONE HCL 2 MG PO TABS: 2.0000 mg | ORAL_TABLET | ORAL | 0 refills | Status: DC | PRN

## 2023-01-27 MED ORDER — FENTANYL CITRATE (PF) 100 MCG/2ML IJ SOLN
INTRAMUSCULAR | Status: AC
  Filled 2023-01-27: qty 2

## 2023-01-27 MED ORDER — LIDOCAINE 2% (20 MG/ML) 5 ML SYRINGE
INTRAMUSCULAR | Status: DC | PRN
  Administered 2023-01-27: 40 mg via INTRAVENOUS

## 2023-01-27 MED ORDER — OXYCODONE HCL 5 MG/5ML PO SOLN: 5.0000 mg | Freq: Once | ORAL | Status: AC | PRN

## 2023-01-27 MED ORDER — VANCOMYCIN HCL 1000 MG IV SOLR
INTRAVENOUS | Status: AC
  Filled 2023-01-27: qty 20

## 2023-01-27 MED ORDER — BUPIVACAINE LIPOSOME 1.3 % IJ SUSP
INTRAMUSCULAR | Status: DC | PRN
  Administered 2023-01-27: 10 mL via PERINEURAL

## 2023-01-27 MED ORDER — TRANEXAMIC ACID-NACL 1000-0.7 MG/100ML-% IV SOLN
1000.0000 mg | INTRAVENOUS | Status: AC
  Administered 2023-01-27: 1000 mg via INTRAVENOUS
  Filled 2023-01-27: qty 100

## 2023-01-27 MED ORDER — OXYCODONE HCL 5 MG PO TABS: 5.0000 mg | ORAL_TABLET | Freq: Once | ORAL | Status: AC | PRN

## 2023-01-27 MED ORDER — LACTATED RINGERS IV SOLN: INTRAVENOUS | Status: DC

## 2023-01-27 MED ORDER — LABETALOL HCL 5 MG/ML IV SOLN
INTRAVENOUS | Status: AC
  Administered 2023-01-27: 5 mg via INTRAVENOUS
  Filled 2023-01-27: qty 4

## 2023-01-27 MED ORDER — FENTANYL CITRATE PF 50 MCG/ML IJ SOSY
25.0000 ug | PREFILLED_SYRINGE | INTRAMUSCULAR | Status: DC | PRN
  Administered 2023-01-27: 25 ug via INTRAVENOUS
  Administered 2023-01-27: 50 ug via INTRAVENOUS

## 2023-01-27 MED ORDER — VANCOMYCIN HCL 1 G IV SOLR
INTRAVENOUS | Status: DC | PRN
  Administered 2023-01-27: 1000 mg

## 2023-01-27 MED ORDER — ROCURONIUM BROMIDE 10 MG/ML (PF) SYRINGE
PREFILLED_SYRINGE | INTRAVENOUS | Status: AC
  Filled 2023-01-27: qty 10

## 2023-01-27 MED ORDER — CEFAZOLIN SODIUM-DEXTROSE 2-4 GM/100ML-% IV SOLN
2.0000 g | INTRAVENOUS | Status: AC
  Administered 2023-01-27: 2 g via INTRAVENOUS
  Filled 2023-01-27: qty 100

## 2023-01-27 MED ORDER — ONDANSETRON HCL 4 MG/2ML IJ SOLN
INTRAMUSCULAR | Status: DC | PRN
  Administered 2023-01-27: 4 mg via INTRAVENOUS

## 2023-01-27 MED ORDER — FENTANYL CITRATE (PF) 100 MCG/2ML IJ SOLN
INTRAMUSCULAR | Status: DC | PRN
  Administered 2023-01-27: 50 ug via INTRAVENOUS

## 2023-01-27 MED ORDER — FENTANYL CITRATE PF 50 MCG/ML IJ SOSY
PREFILLED_SYRINGE | INTRAMUSCULAR | Status: AC
  Administered 2023-01-27: 50 ug via INTRAVENOUS
  Filled 2023-01-27: qty 2

## 2023-01-27 MED ORDER — PROPOFOL 10 MG/ML IV BOLUS
INTRAVENOUS | Status: AC
  Filled 2023-01-27: qty 20

## 2023-01-27 MED ORDER — ONDANSETRON HCL 4 MG/2ML IJ SOLN: 4.0000 mg | Freq: Once | INTRAMUSCULAR | Status: DC | PRN

## 2023-01-27 MED ORDER — PHENYLEPHRINE HCL-NACL 20-0.9 MG/250ML-% IV SOLN
INTRAVENOUS | Status: DC | PRN
  Administered 2023-01-27: 20 ug/min via INTRAVENOUS

## 2023-01-27 MED ORDER — OXYCODONE HCL 5 MG PO TABS
ORAL_TABLET | ORAL | Status: AC
  Administered 2023-01-27: 5 mg via ORAL
  Filled 2023-01-27: qty 1

## 2023-01-27 MED ORDER — SUGAMMADEX SODIUM 200 MG/2ML IV SOLN
INTRAVENOUS | Status: DC | PRN
  Administered 2023-01-27: 40 mg via INTRAVENOUS
  Administered 2023-01-27: 160 mg via INTRAVENOUS

## 2023-01-27 MED ORDER — ONDANSETRON HCL 4 MG PO TABS: 4.0000 mg | ORAL_TABLET | Freq: Three times a day (TID) | ORAL | 0 refills | Status: AC | PRN

## 2023-01-27 MED ORDER — LABETALOL HCL 5 MG/ML IV SOLN: 5.0000 mg | INTRAVENOUS | Status: DC | PRN

## 2023-01-27 MED ORDER — MIDAZOLAM HCL 2 MG/2ML IJ SOLN
0.5000 mg | INTRAMUSCULAR | Status: DC
  Filled 2023-01-27: qty 2

## 2023-01-27 MED ORDER — ORAL CARE MOUTH RINSE: 15.0000 mL | Freq: Once | OROMUCOSAL | Status: AC

## 2023-01-27 MED ORDER — MIDAZOLAM HCL 2 MG/2ML IJ SOLN
INTRAMUSCULAR | Status: AC
  Filled 2023-01-27: qty 2

## 2023-01-27 MED ORDER — ROCURONIUM BROMIDE 10 MG/ML (PF) SYRINGE
PREFILLED_SYRINGE | INTRAVENOUS | Status: DC | PRN
  Administered 2023-01-27: 20 mg via INTRAVENOUS
  Administered 2023-01-27: 70 mg via INTRAVENOUS

## 2023-01-27 MED ORDER — STERILE WATER FOR IRRIGATION IR SOLN
Status: DC | PRN
  Administered 2023-01-27: 2000 mL

## 2023-01-27 MED ORDER — LIDOCAINE HCL (PF) 2 % IJ SOLN
INTRAMUSCULAR | Status: AC
  Filled 2023-01-27: qty 5

## 2023-01-27 MED ORDER — BUPIVACAINE HCL (PF) 0.5 % IJ SOLN
INTRAMUSCULAR | Status: DC | PRN
  Administered 2023-01-27: 20 mL via PERINEURAL

## 2023-01-27 MED ORDER — FENTANYL CITRATE PF 50 MCG/ML IJ SOSY
PREFILLED_SYRINGE | INTRAMUSCULAR | Status: AC
  Administered 2023-01-27: 25 ug via INTRAVENOUS
  Filled 2023-01-27: qty 1

## 2023-01-27 MED ORDER — PROPOFOL 10 MG/ML IV BOLUS
INTRAVENOUS | Status: DC | PRN
  Administered 2023-01-27: 20 mg via INTRAVENOUS
  Administered 2023-01-27: 120 mg via INTRAVENOUS

## 2023-01-27 MED ORDER — CYCLOBENZAPRINE HCL 10 MG PO TABS: 10.0000 mg | ORAL_TABLET | Freq: Three times a day (TID) | ORAL | 1 refills | Status: AC | PRN

## 2023-01-27 MED ORDER — TRANEXAMIC ACID 1000 MG/10ML IV SOLN: 1000.0000 mg | INTRAVENOUS | Status: DC

## 2023-01-27 MED ORDER — CHLORHEXIDINE GLUCONATE 0.12 % MT SOLN
15.0000 mL | Freq: Once | OROMUCOSAL | Status: AC
  Administered 2023-01-27: 15 mL via OROMUCOSAL

## 2023-01-27 MED ORDER — DEXAMETHASONE SODIUM PHOSPHATE 10 MG/ML IJ SOLN
INTRAMUSCULAR | Status: DC | PRN
  Administered 2023-01-27: 4 mg via INTRAVENOUS

## 2023-01-27 MED ORDER — DEXAMETHASONE SODIUM PHOSPHATE 10 MG/ML IJ SOLN
INTRAMUSCULAR | Status: AC
  Filled 2023-01-27: qty 1

## 2023-01-27 MED ORDER — FENTANYL CITRATE PF 50 MCG/ML IJ SOSY
25.0000 ug | PREFILLED_SYRINGE | INTRAMUSCULAR | Status: AC
  Administered 2023-01-27: 50 ug via INTRAVENOUS
  Filled 2023-01-27: qty 2

## 2023-01-27 MED ORDER — ONDANSETRON HCL 4 MG/2ML IJ SOLN
INTRAMUSCULAR | Status: AC
  Filled 2023-01-27: qty 2

## 2023-01-27 MED ORDER — 0.9 % SODIUM CHLORIDE (POUR BTL) OPTIME
TOPICAL | Status: DC | PRN
  Administered 2023-01-27: 1000 mL

## 2023-01-27 SURGICAL SUPPLY — 69 items
ADH SKN CLS APL DERMABOND .7 (GAUZE/BANDAGES/DRESSINGS) ×1
AID PSTN UNV HD RSTRNT DISP (MISCELLANEOUS) ×1
BAG COUNTER SPONGE SURGICOUNT (BAG) IMPLANT
BAG SPEC THK2 15X12 ZIP CLS (MISCELLANEOUS) ×1
BAG SPNG CNTER NS LX DISP (BAG)
BAG ZIPLOCK 12X15 (MISCELLANEOUS) ×1 IMPLANT
BIT DRILL AR 3 (BIT) ×1
BIT DRILL AR 3 NS (BIT) IMPLANT
BLADE SAW SGTL 83.5X18.5 (BLADE) ×1 IMPLANT
BNDG CMPR 5X4 CHSV STRCH STRL (GAUZE/BANDAGES/DRESSINGS) ×1
BNDG COHESIVE 4X5 TAN STRL LF (GAUZE/BANDAGES/DRESSINGS) ×1 IMPLANT
BSPLAT GLND +2X24 MDLR (Joint) ×1 IMPLANT
COOLER ICEMAN CLASSIC (MISCELLANEOUS) ×1 IMPLANT
COVER BACK TABLE 60X90IN (DRAPES) ×1 IMPLANT
COVER SURGICAL LIGHT HANDLE (MISCELLANEOUS) ×1 IMPLANT
CUP SUT UNIV REVERS 39 NEU (Shoulder) IMPLANT
DERMABOND ADVANCED .7 DNX12 (GAUZE/BANDAGES/DRESSINGS) ×1 IMPLANT
DRAPE ORTHO SPLIT 77X108 STRL (DRAPES) ×2
DRAPE SHEET LG 3/4 BI-LAMINATE (DRAPES) ×1 IMPLANT
DRAPE SURG 17X11 SM STRL (DRAPES) ×1 IMPLANT
DRAPE SURG ORHT 6 SPLT 77X108 (DRAPES) ×2 IMPLANT
DRAPE TOP 10253 STERILE (DRAPES) ×1 IMPLANT
DRAPE U-SHAPE 47X51 STRL (DRAPES) ×1 IMPLANT
DRESSING AQUACEL AG SP 3.5X6 (GAUZE/BANDAGES/DRESSINGS) ×1 IMPLANT
DRSG AQUACEL AG ADV 3.5X10 (GAUZE/BANDAGES/DRESSINGS) IMPLANT
DRSG AQUACEL AG SP 3.5X6 (GAUZE/BANDAGES/DRESSINGS) ×1
DURAPREP 26ML APPLICATOR (WOUND CARE) ×1 IMPLANT
ELECT BLADE TIP CTD 4 INCH (ELECTRODE) ×1 IMPLANT
ELECT PENCIL ROCKER SW 15FT (MISCELLANEOUS) ×1 IMPLANT
ELECT REM PT RETURN 15FT ADLT (MISCELLANEOUS) ×1 IMPLANT
FACESHIELD WRAPAROUND (MASK) ×5 IMPLANT
FACESHIELD WRAPAROUND OR TEAM (MASK) ×5 IMPLANT
GLENOID UNI REV MOD 24 +2 LAT (Joint) IMPLANT
GLENOSPHERE 39+4 LAT/24 UNI RV (Joint) IMPLANT
GLOVE BIO SURGEON STRL SZ7.5 (GLOVE) ×1 IMPLANT
GLOVE BIO SURGEON STRL SZ8 (GLOVE) ×1 IMPLANT
GLOVE SS BIOGEL STRL SZ 7 (GLOVE) ×1 IMPLANT
GLOVE SS BIOGEL STRL SZ 7.5 (GLOVE) ×1 IMPLANT
GOWN STRL SURGICAL XL XLNG (GOWN DISPOSABLE) ×2 IMPLANT
INSERT HUMERAL MED 39/ +3 (Shoulder) IMPLANT
KIT BASIN OR (CUSTOM PROCEDURE TRAY) ×1 IMPLANT
KIT TURNOVER KIT A (KITS) IMPLANT
MANIFOLD NEPTUNE II (INSTRUMENTS) ×1 IMPLANT
NDL TAPERED W/ NITINOL LOOP (MISCELLANEOUS) ×1 IMPLANT
NEEDLE TAPERED W/ NITINOL LOOP (MISCELLANEOUS) ×1 IMPLANT
NS IRRIG 1000ML POUR BTL (IV SOLUTION) ×1 IMPLANT
PACK SHOULDER (CUSTOM PROCEDURE TRAY) ×1 IMPLANT
PAD ARMBOARD 7.5X6 YLW CONV (MISCELLANEOUS) ×1 IMPLANT
PAD COLD SHLDR WRAP-ON (PAD) ×1 IMPLANT
PIN NITINOL TARGETER 2.8 (PIN) IMPLANT
PIN SET MODULAR GLENOID SYSTEM (PIN) IMPLANT
RESTRAINT HEAD UNIVERSAL NS (MISCELLANEOUS) ×1 IMPLANT
SCREW CENTRAL MOD 30MM (Screw) IMPLANT
SCREW PERI LOCK 5.5X24 (Screw) IMPLANT
SCREW PERIPHERAL 5.5X20 LOCK (Screw) IMPLANT
SCREW PERIPHERAL 5.5X40 LOCK (Screw) IMPLANT
SLING ARM FOAM STRAP LRG (SOFTGOODS) IMPLANT
SLING ARM FOAM STRAP MED (SOFTGOODS) IMPLANT
STEM HUMERAL UNIVER REV SIZE 7 (Stem) IMPLANT
SUT MNCRL AB 3-0 PS2 18 (SUTURE) ×1 IMPLANT
SUT MON AB 2-0 CT1 36 (SUTURE) ×1 IMPLANT
SUT VIC AB 1 CT1 36 (SUTURE) ×1 IMPLANT
SUTURE TAPE 1.3 40 TPR END (SUTURE) ×2 IMPLANT
SUTURETAPE 1.3 40 TPR END (SUTURE) ×2
TOWEL OR 17X26 10 PK STRL BLUE (TOWEL DISPOSABLE) ×1 IMPLANT
TOWEL OR NON WOVEN STRL DISP B (DISPOSABLE) ×1 IMPLANT
TUBE SUCTION HIGH CAP CLEAR NV (SUCTIONS) ×1 IMPLANT
TUBING CONNECTING 10 (TUBING) ×1 IMPLANT
WATER STERILE IRR 1000ML POUR (IV SOLUTION) ×2 IMPLANT

## 2023-01-27 NOTE — Anesthesia Procedure Notes (Signed)
Anesthesia Regional Block: Interscalene brachial plexus block   Pre-Anesthetic Checklist: , timeout performed,  Correct Patient, Correct Site, Correct Laterality,  Correct Procedure, Correct Position, site marked,  Risks and benefits discussed,  Surgical consent,  Pre-op evaluation,  At surgeon's request and post-op pain management  Laterality: Left  Prep: chloraprep       Needles:  Injection technique: Single-shot  Needle Type: Echogenic Stimulator Needle     Needle Length: 10cm  Needle Gauge: 21   Needle insertion depth: 6 cm   Additional Needles:   Procedures:,,,, ultrasound used (permanent image in chart),,   Motor weakness within 5 minutes.  Narrative:  Start time: 01/27/2023 11:27 AM End time: 01/27/2023 11:32 AM Injection made incrementally with aspirations every 5 mL.  Performed by: Personally  Anesthesiologist: Mal Amabile, MD  Additional Notes: Timeout performed. Patient sedated. Relevant anatomy ID'd using [place]. Incremental 2-32ml injection of LA with frequent aspiration. Patient tolerated procedure well.     Left Interscalene Block

## 2023-01-27 NOTE — Evaluation (Signed)
Occupational Therapy Evaluation Patient Details Name: James Pearson MRN: [id number] DOB: 03/26/1946 Today's Date: 01/27/2023   History of Present Illness Mr. Puett is a 77 yr old male who is s/p L shoulder reverse arthroplasty on 01-27-23, due to L shoulder rotator cuff tear arthropathy.   Clinical Impression   Pt is s/p shoulder replacement without functional use of left non-dominant upper extremity secondary to effects of interscalene block and shoulder precautions. Therapist provided education and instruction to patient and significant other with regards to ROM/exercises, post-op precautions, UE positioning, donning upper extremity clothing, recommendations for bathing while maintaining shoulder precautions, correct use of ice machine, sling wear schedule, use of ice for pain and edema management, and donning/doffing sling. Patient and significant other verbalized understanding and demonstrated as needed. Patient needed assistance to donn shirt, underwear, pants, and shoes, with instruction on compensatory strategies to perform ADLs. Patient to follow up with MD for further therapy needs.        Recommendations for follow up therapy are one component of a multi-disciplinary discharge planning process, led by the attending physician.  Recommendations may be updated based on patient status, additional functional criteria and insurance authorization.   Assistance Recommended at Discharge Intermittent Supervision/Assistance  Patient can return home with the following Assist for transportation;Assistance with cooking/housework;A little help with bathing/dressing/bathroom    Functional Status Assessment  Patient has had a recent decline in their functional status and demonstrates the ability to make significant improvements in function in a reasonable and predictable amount of time.  Equipment Recommendations  None recommended by OT       Precautions / Restrictions Precautions Precautions:  Shoulder Type of Shoulder Precautions: If sitting in controlled environment, ok to come out of sling to give neck a break. Please sleep in it to protect until follow up in office.     OK to use operative arm for feeding, hygiene and ADLs.   Ok to instruct Pendulums and lap slides as exercises. Ok to use operative arm within the following parameters for ADL purposes     New ROM (4/09)   Ok for PROM, AAROM, AROM within pain tolerance and within the following ROM   ER 20   ABD 45   FE 60 Shoulder Interventions: Shoulder sling/immobilizer Precaution Booklet Issued: Yes (comment) Required Braces or Orthoses: Sling Restrictions Weight Bearing Restrictions: Yes LUE Weight Bearing: Non weight bearing Other Position/Activity Restrictions: sling to be worn at all times except ADLs/exercise. Okay to perform LUE elbow, wrist, and hand AROM      Mobility Bed Mobility      General bed mobility comments: pt was received seated in chair    Transfers Overall transfer level: Needs assistance   Transfers: Sit to/from Stand Sit to Stand: Supervision                  Balance Overall balance assessment: No apparent balance deficits (not formally assessed)              ADL either performed or assessed with clinical judgement                   Pertinent Vitals/Pain Pain Assessment Pain Assessment: 0-10 Pain Score: 2  Pain Location: L shoulder Pain Intervention(s): Limited activity within patient's tolerance     Hand Dominance Right      Communication Communication Communication: No difficulties   Cognition Arousal/Alertness: Awake/alert Behavior During Therapy: WFL for tasks assessed/performed Overall Cognitive Status: Within Functional Limits for tasks assessed  Shoulder Instructions Shoulder Instructions Donning/doffing shirt without moving shoulder: Caregiver independent with task Method for sponge bathing under operated UE: Caregiver independent  with task Donning/doffing sling/immobilizer: Minimal assistance Correct positioning of sling/immobilizer: Minimal assistance Pendulum exercises (written home exercise program): Caregiver independent with task ROM for elbow, wrist and digits of operated UE: Caregiver independent with task Sling wearing schedule (on at all times/off for ADL's): Caregiver independent with task Proper positioning of operated UE when showering: Caregiver independent with task Positioning of UE while sleeping: Caregiver independent with task    Home Living Family/patient expects to be discharged to:: Private residence Living Arrangements: Alone   Type of Home: House Home Access: Stairs to enter Secretary/administrator of Steps: 1   Home Layout: One level     Bathroom Shower/Tub: Walk-in shower         Home Equipment: Pharmacist, hospital (2 wheels);Cane - single point          Prior Functioning/Environment Prior Level of Function : Independent/Modified Independent             Mobility Comments: He was independent with ambulation. ADLs Comments: He was independent with ADLs, driving, cooking, and cleaning.        OT Problem List: Impaired UE functional use      OT Treatment/Interventions:   N/A      OT Frequency:  N/A       AM-PAC OT "6 Clicks" Daily Activity     Outcome Measure Help from another person eating meals?: None Help from another person taking care of personal grooming?: None Help from another person toileting, which includes using toliet, bedpan, or urinal?: None Help from another person bathing (including washing, rinsing, drying)?: A Little Help from another person to put on and taking off regular upper body clothing?: A Little Help from another person to put on and taking off regular lower body clothing?: A Little 6 Click Score: 21   End of Session Nurse Communication: Other (comment) (shoulder education completed)  Activity Tolerance: Patient tolerated  treatment well Patient left: in chair;with call bell/phone within reach;with family/visitor present  OT Visit Diagnosis: Muscle weakness (generalized) (M62.81)                Time: 4098-1191 OT Time Calculation (min): 27 min Charges:  OT General Charges $OT Visit: 1 Visit OT Evaluation $OT Eval Low Complexity: 1 Low OT Treatments $Self Care/Home Management : 8-22 mins    Reuben Likes, OTR/L 01/27/2023, 5:12 PM

## 2023-01-27 NOTE — Anesthesia Postprocedure Evaluation (Signed)
Anesthesia Post Note  Patient: James Pearson  Procedure(s) Performed: REVERSE SHOULDER ARTHROPLASTY (Left: Shoulder)     Patient location during evaluation: PACU Anesthesia Type: General Level of consciousness: awake and alert and oriented Pain management: pain level controlled Vital Signs Assessment: post-procedure vital signs reviewed and stable Respiratory status: spontaneous breathing, nonlabored ventilation and respiratory function stable Cardiovascular status: blood pressure returned to baseline and stable Postop Assessment: no apparent nausea or vomiting Anesthetic complications: no   No notable events documented.  Last Vitals:  Vitals:   01/27/23 1354 01/27/23 1400  BP: (!) 187/95 (!) 175/87  Pulse: 82 84  Resp: 14 14  Temp: (!) 36.3 C   SpO2: 100% 99%    Last Pain:  Vitals:   01/27/23 1400  PainSc: 3                  Shakeyla Giebler A.

## 2023-01-27 NOTE — Op Note (Addendum)
01/27/2023  1:30 PM  PATIENT:   James Pearson  77 y.o. male  PRE-OPERATIVE DIAGNOSIS:  Left shoulder rotator cuff tear arthropathy  POST-OPERATIVE DIAGNOSIS: Same  PROCEDURE: Left shoulder reverse arthroplasty utilizing a press-fit size 7 Arthrex stem with a neutral metaphysis, +3 polyethylene insert, 39/+4 glenosphere and a small/+2 baseplate  SURGEON:  Chasitty Hehl, Vania Rea M.D.  ASSISTANTS: Ralene Bathe, PA-C  Ralene Bathe, PA-C was utilized as an Geophysicist/field seismologist throughout this case, essential for help with positioning the patient, positioning extremity, tissue manipulation, implantation of the prosthesis, suture management, wound closure, and intraoperative decision-making.   ANESTHESIA:   General endotracheal and interscalene block with Exparel  EBL: 50 cc  SPECIMEN: None  Drains: None   PATIENT DISPOSITION:  PACU - hemodynamically stable.    PLAN OF CARE: Discharge to home after PACU  Brief history:  Patient is a 77 year old gentleman with chronic and progressive increasing left shoulder pain related to advanced glenohumeral arthritis and diffuse rotator cuff tendinopathy and degenerative tearing.  His symptoms have been refractory to prolonged attempts at conservative management and due to his increasing functional imitations he is brought to the operating room at this time for planned left shoulder reverse arthroplasty.  Preoperatively, I counseled the patient regarding treatment options and risks versus benefits thereof.  Possible surgical complications were all reviewed including potential for bleeding, infection, neurovascular injury, persistent pain, loss of motion, anesthetic complication, failure of the implant, and possible need for additional surgery. They understand and accept and agrees with our planned procedure.   Procedure detail:  After undergoing routine preop evaluation the patient received prophylactic antibiotics and interscalene block with Exparel was  established in the holding area by the anesthesia department.  Subsequently placed supine on the operating table and underwent the smooth induction of the general endotracheal anesthesia.  Placed into the beachchair position and appropriately padded and protected.  The left shoulder girdle region was sterilely prepped and draped in standard fashion.  Timeout was called.  A deltopectoral approach to the left shoulder is made an approximately 8 cm incision.  Skin flaps were elevated dissection carried deeply and the deltopectoral interval was then developed from proximal to this with the vein taken laterally.  The long head biceps tendon was then tenodesed at the upper border the pectoralis major tendon with proximal segment unroofed and excised.  The superior rotator cuff was split from the apex of the bicipital groove to the base of the coracoid and the subscapularis was then separated from the lesser tuberosity using electrocautery and tagged with a pair of grasping suture tape sutures.  Capsular attachments were then divided from the anterior and infra margins of the humeral neck and the humeral head was then delivered through the wound.  An extra medullary guide was then used to outline the proposed humeral head resection which we performed with an oscillating saw at approximate 20 degrees of retroversion.  A metal cap was then placed over the cut proximal humeral surface we then exposed the glenoid and performed a circumferential labral resection.  Guidepin was then directed into the center of the glenoid and the glenoid was then reamed with the central followed by the peripheral reamer to stable [place] bony bed.  Preparation completed with a drill and tap for 30 mm lag screw.  The joint was then copiously irrigated free of all debris.  Baseplate was assembled and inserted with vancomycin powder applied to the threads of the lag screw and excellent purchase and fixation was achieved.  All of the peripheral  locking screws were then placed using standard technique with excellent fixation.  A 39/+4 glenosphere was then impacted onto the baseplate and a central locking screw was placed.  We returned attention back to the proximal humerus where the canal was opened and broached up initially to a size 9 but were unable to achieve appropriate soft tissue balance for reduction and then downsized to 7 which allow the implant to see further we used a neutral metaphyseal reaming guide to prepare the metaphysis and a trial reduction showed good motion stability and soft tissue balance.  This point the trial was then removed.  A final implant was assembled.  Canal was irrigated cleaned and dried and vancomycin powder sprayed into the humeral canal and her implant was then seated with excellent fixation.  Trial reduction showed excellent motion stability and soft tissue balance with a +3 poly gave is the best soft tissue balance.  Trial was then removed.  A final +3 poly was then impacted onto the implant after was cleaned and dried.  A final reduction showed excellent motion stability and soft tissue balance all much more satisfaction.  The wound was then copiously irrigated.  Final hemostasis was obtained.  The balance of the vancomycin powder was sprayed liberally throughout the deep soft tissue planes.  The subscapularis was repaired back to the eyelets on the collar of the implant using the previously placed suture tape sutures after had been mobilized and demonstrated good elasticity.  Deltopectoral interval was reapproximated the series of figure-of-eight number Vicryl sutures.  2-0 Monocryl used to close the subcu layer and intracuticular 3-0 Monocryl used to close the skin followed by Dermabond and an Aquacel dressing.  The left arm was then placed into a sling and the patient was awakened, extubated, and taken to the recovery room in stable condition.  James Lange MD   Contact # [phone]

## 2023-01-27 NOTE — H&P (Signed)
James Pearson    Chief Complaint: Left shoulder rotator cuff tear arthropathy HPI: The patient is a 77 y.o. male with chronic and progressively increasing left shoulder pain related to severe degenerative chondrosis of the glenohumeral joint as well as associated rotator cuff dysfunction and degeneration.  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 His smoking use included cigarettes. He has never used smokeless tobacco. He reports that he does not drink alcohol and does not use drugs.  BMI: Estimated body mass index is 26 kg/Pearson as calculated from the following:   Height as of 01/19/23: 5\' 8"  (1.727 Pearson).   Weight as of 01/19/23: 77.6 kg.  Lab Results  Component Value Date   ALBUMIN 4.0 10/28/2022   Diabetes:   Patient has a diagnosis of diabetes,  Lab Results  Component Value Date   HGBA1C 6.2 (H) 01/12/2023   Smoking Status:       No medications prior to admission.     Physical Exam: Left shoulder demonstrates painful and guarded motion as noted at recent office visits.  Strength is globally decreased.  He is otherwise neurovascular intact in the left  upper extremity.  Radiographs  Imaging studies confirmed significant joint space narrowing.  MRI scan confirms severe degeneration and attenuation of the rotator cuff  Vitals     Assessment/Plan  Impression: Left shoulder rotator cuff tear arthropathy  Plan of Action: Procedure(s): REVERSE SHOULDER ARTHROPLASTY  James Pearson James Pearson 01/27/2023, 6:16 AM Contact # [phone]

## 2023-01-27 NOTE — Anesthesia Procedure Notes (Signed)
Procedure Name: Intubation Date/Time: 01/27/2023 12:22 PM  Performed by: Sindy Guadeloupe, CRNAPre-anesthesia Checklist: Patient identified, Emergency Drugs available, Suction available, Patient being monitored and Timeout performed Patient Re-evaluated:Patient Re-evaluated prior to induction Oxygen Delivery Method: Circle system utilized Preoxygenation: Pre-oxygenation with 100% oxygen Induction Type: IV induction Ventilation: Mask ventilation without difficulty Laryngoscope Size: Mac and 4 Grade View: Grade I Tube type: Oral Tube size: 7.5 mm Number of attempts: 1 Airway Equipment and Method: Stylet Placement Confirmation: ETT inserted through vocal cords under direct vision, positive ETCO2 and breath sounds checked- equal and bilateral Secured at: 21 cm Tube secured with: Tape Dental Injury: Teeth and Oropharynx as per pre-operative assessment

## 2023-01-27 NOTE — Discharge Instructions (Signed)

## 2023-01-27 NOTE — Transfer of Care (Signed)
Immediate Anesthesia Transfer of Care Note  Patient: Rashidi Nakayama  Procedure(s) Performed: REVERSE SHOULDER ARTHROPLASTY (Left: Shoulder)  Patient Location: PACU  Anesthesia Type:General  Level of Consciousness: awake, drowsy, and patient cooperative  Airway & Oxygen Therapy: Patient Spontanous Breathing and Patient connected to face mask oxygen  Post-op Assessment: Report given to RN and Post -op Vital signs reviewed and stable  Post vital signs: Reviewed and stable  Last Vitals:  Vitals Value Taken Time  BP 187/95 01/27/23 1352  Temp    Pulse 82 01/27/23 1355  Resp 14 01/27/23 1355  SpO2 100 % 01/27/23 1355  Vitals shown include unvalidated device data.  Last Pain:  Vitals:   01/27/23 1131  PainSc: 0-No pain         Complications: No notable events documented.

## 2023-01-27 NOTE — Anesthesia Preprocedure Evaluation (Addendum)
Anesthesia Evaluation  Patient identified by MRN, date of birth, ID band  Reviewed: Allergy & Precautions, NPO status , Patient's Chart, lab work & pertinent test results, reviewed documented beta blocker date and time   Airway Mallampati: II  TM Distance: >3 FB Neck ROM: Full    Dental no notable dental hx. (+) Teeth Intact, Dental Advisory Given, Caps   Pulmonary former smoker   Pulmonary exam normal breath sounds clear to auscultation       Cardiovascular hypertension, Pt. on medications + DOE  Normal cardiovascular exam Rhythm:Regular Rate:Normal  EKG 01/19/23 NSR, anteroseptal MI   Neuro/Psych  PSYCHIATRIC DISORDERS Anxiety Depression     Neuromuscular disease CVA, Residual Symptoms    GI/Hepatic ,GERD  Medicated,,(+) Hepatitis -  Endo/Other  diabetes, Well Controlled, Type 2, Oral Hypoglycemic Agents  Hyperlipidemia  Renal/GU negative Renal ROS  negative genitourinary   Musculoskeletal  (+) Arthritis , Osteoarthritis,  Left shoulder rotator cuff arthropathy   Abdominal Normal abdominal exam  (+)   Peds  Hematology   Anesthesia Other Findings   Reproductive/Obstetrics                             Anesthesia Physical Anesthesia Plan  ASA: 2  Anesthesia Plan: General   Post-op Pain Management: Regional block* and Minimal or no pain anticipated   Induction: Intravenous  PONV Risk Score and Plan: 3 and Treatment may vary due to age or medical condition and Ondansetron  Airway Management Planned: Oral ETT  Additional Equipment: None  Intra-op Plan:   Post-operative Plan: Extubation in OR  Informed Consent: I have reviewed the patients History and Physical, chart, labs and discussed the procedure including the risks, benefits and alternatives for the proposed anesthesia with the patient or authorized representative who has indicated his/her understanding and acceptance.     Dental  advisory given  Plan Discussed with: Anesthesiologist and CRNA  Anesthesia Plan Comments:         Anesthesia Quick Evaluation

## 2023-01-28 ENCOUNTER — Encounter (HOSPITAL_COMMUNITY): Payer: Self-pay | Admitting: Orthopedic Surgery

## 2023-02-21 LAB — HEMOGLOBIN A1C: Hemoglobin A1C: 6.8

## 2023-02-27 ENCOUNTER — Other Ambulatory Visit: Payer: Self-pay | Admitting: Nurse Practitioner

## 2023-02-27 DIAGNOSIS — Z760 Encounter for issue of repeat prescription: Secondary | ICD-10-CM

## 2023-02-27 DIAGNOSIS — I10 Essential (primary) hypertension: Secondary | ICD-10-CM

## 2023-02-27 DIAGNOSIS — N400 Enlarged prostate without lower urinary tract symptoms: Secondary | ICD-10-CM

## 2023-03-01 IMAGING — DX DG CHEST 2V
2 series · 2 of 2 positions shown · non-contrast
Comparison: Chest radiograph January 01, 2021

CLINICAL DATA: Weight loss.

EXAM:
CHEST - 2 VIEW

[chest pa]
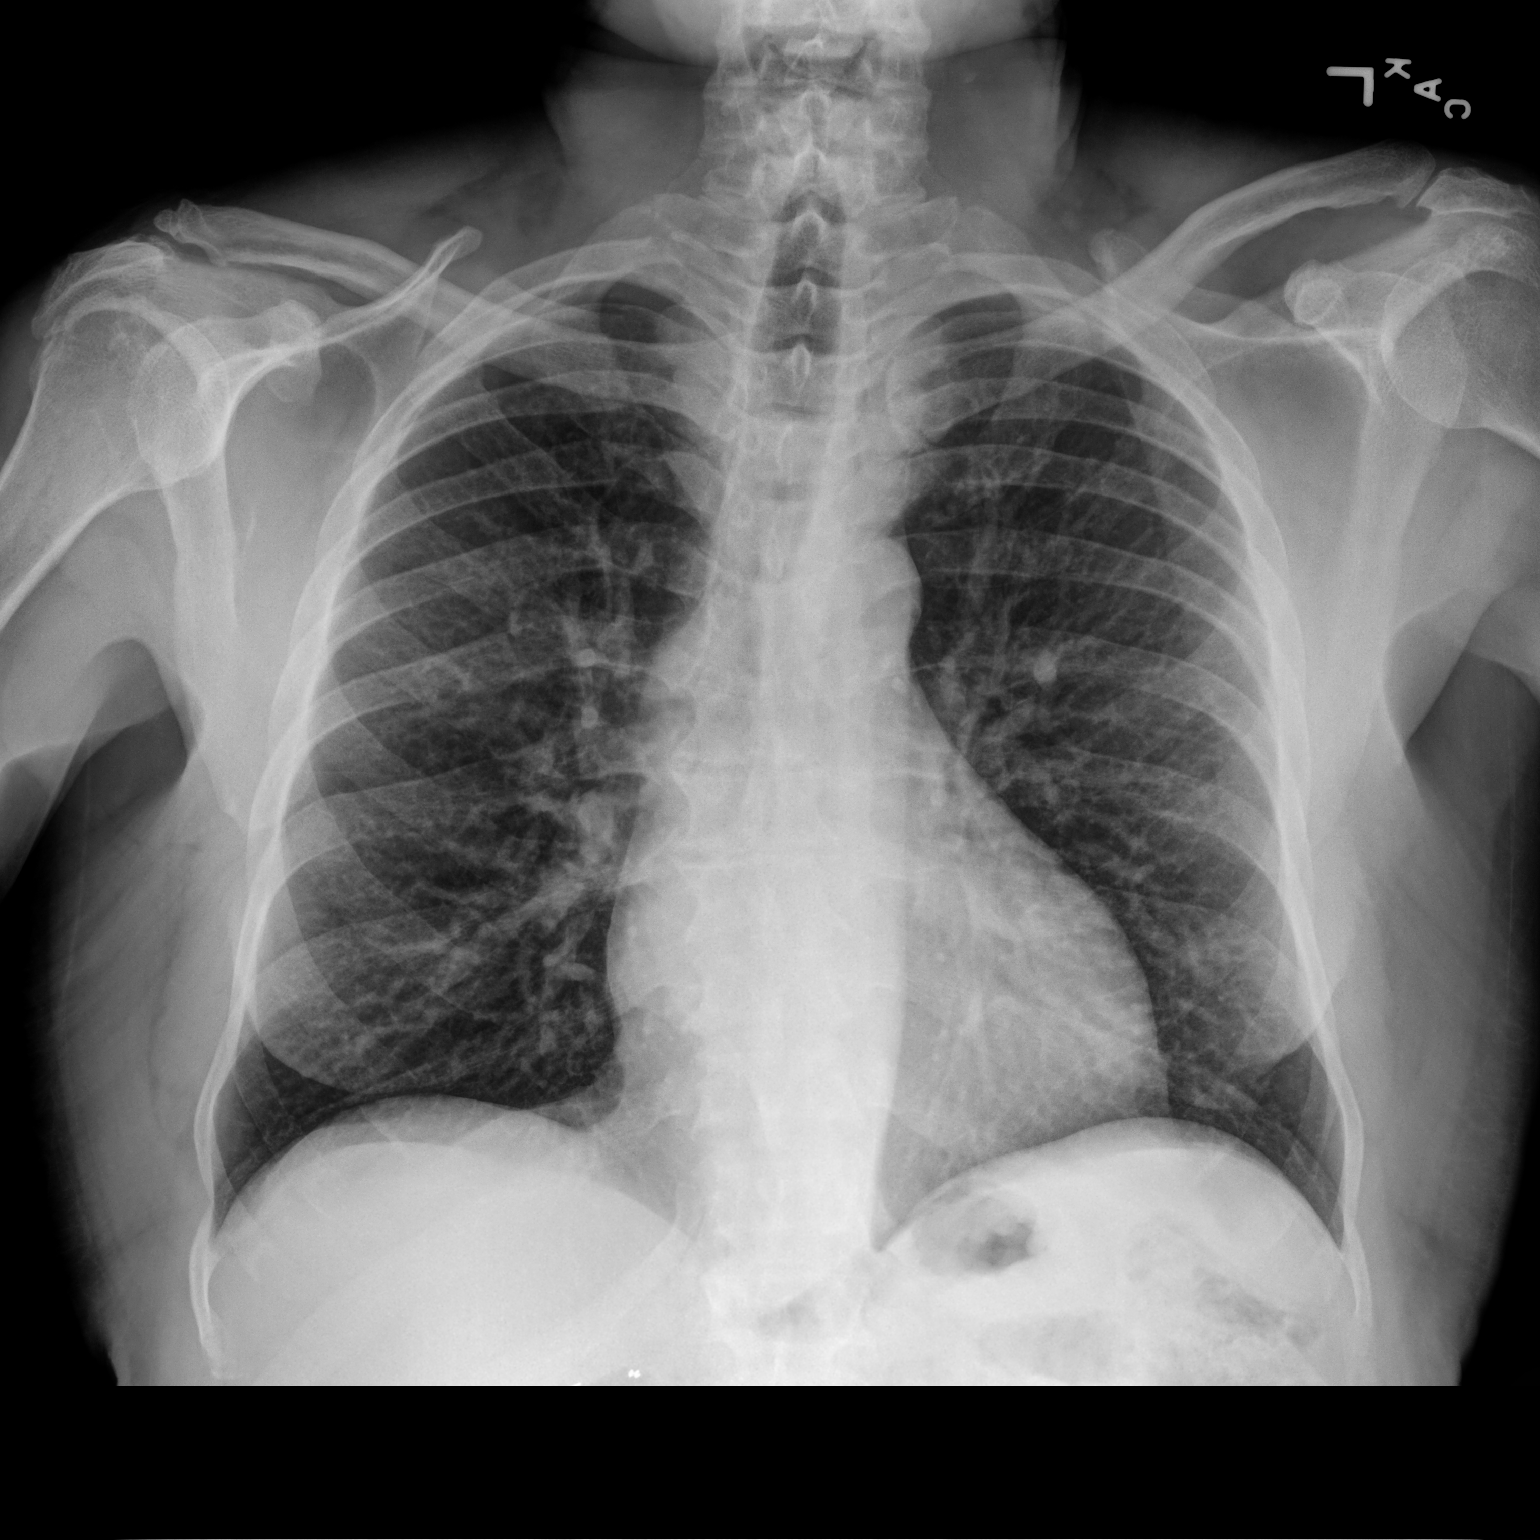

[chest lat]
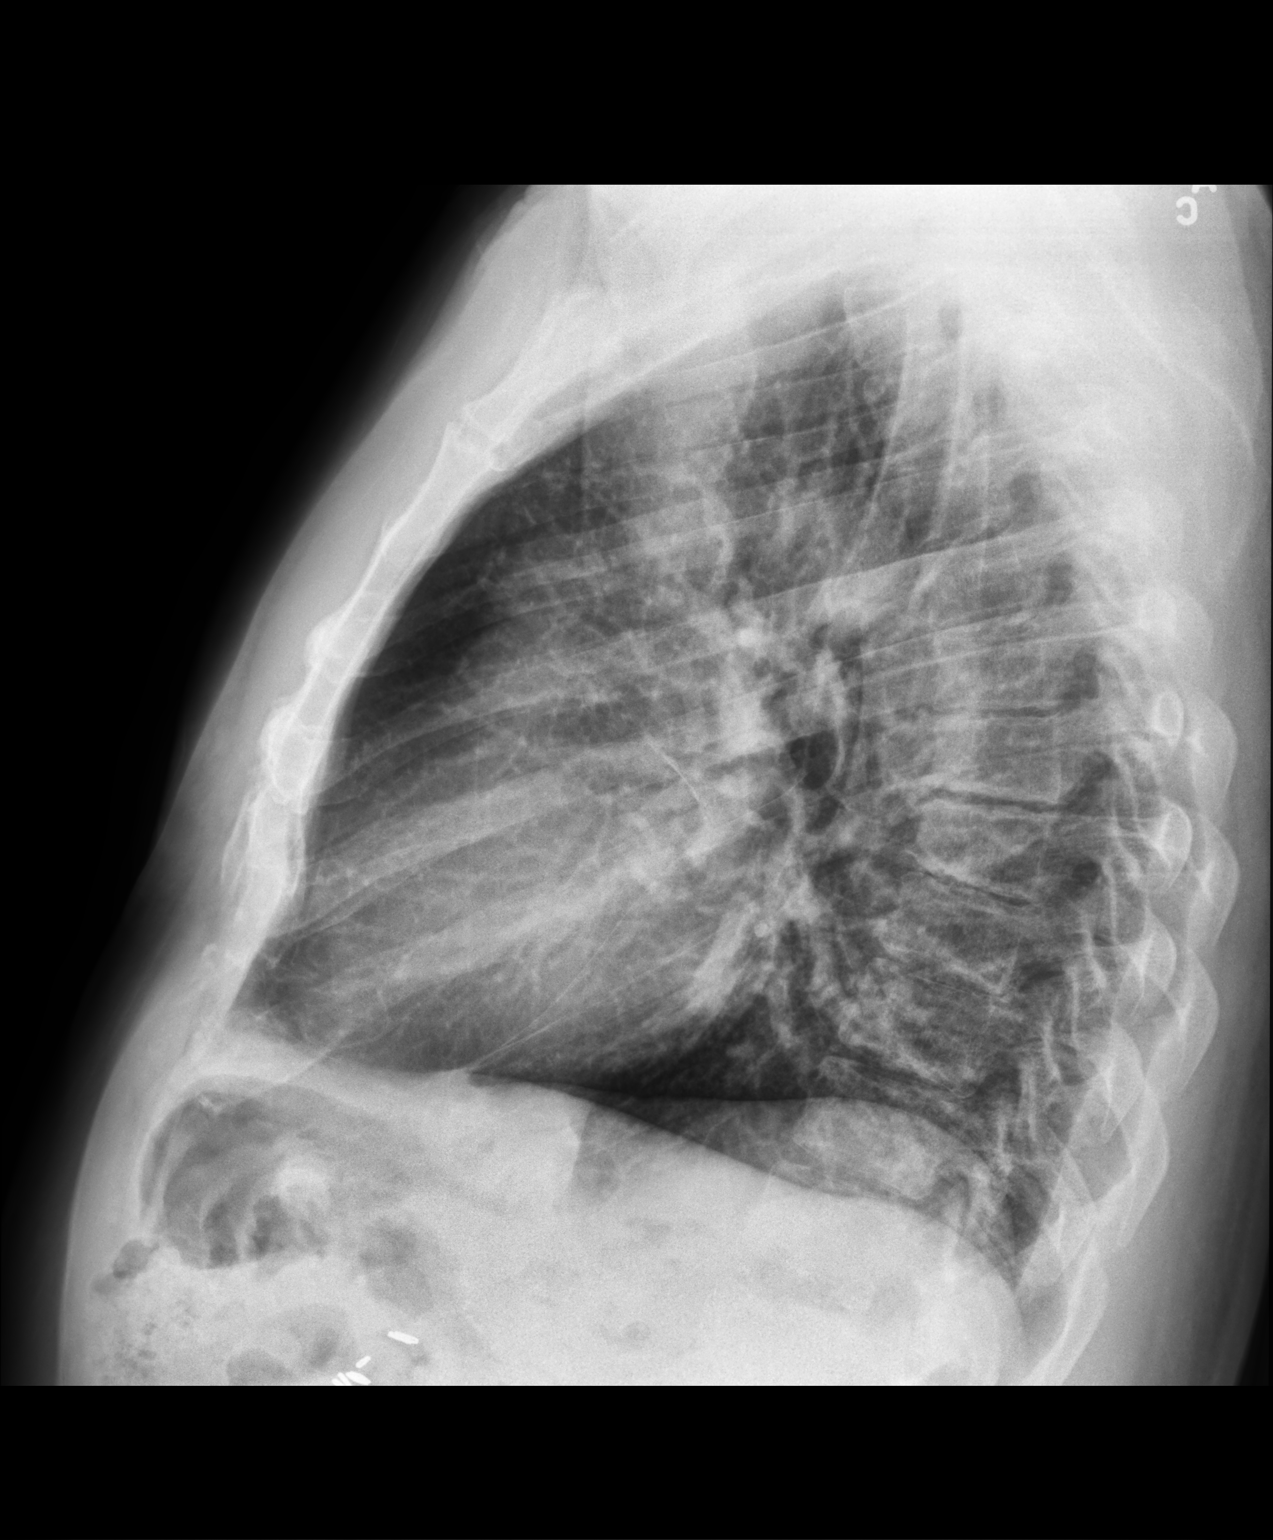

[2 of 2 positions shown; findings below may reference images not displayed]

FINDINGS: The heart size and mediastinal contours are within normal limits.
Both lungs are clear. The visualized skeletal structures are
unremarkable.
IMPRESSION: No active cardiopulmonary disease.

## 2023-03-09 ENCOUNTER — Encounter (INDEPENDENT_AMBULATORY_CARE_PROVIDER_SITE_OTHER): Payer: Self-pay

## 2023-03-10 ENCOUNTER — Telehealth: Payer: Self-pay | Admitting: Nurse Practitioner

## 2023-03-10 NOTE — Telephone Encounter (Signed)
Patient dropped off document Handicap Placard, to be filled out by provider. Patient requested to send it back via Call Patient to pick up within 5-days. Document is located in providers tray at front office.Please advise at [facility] [phone]   Patient also dropped off a copy of his labwork from the [place].

## 2023-03-10 NOTE — Telephone Encounter (Signed)
Placed in your box for review.  °

## 2023-03-14 NOTE — Telephone Encounter (Signed)
Left detailed message on voicemail to let know ready for pick up

## 2023-03-14 NOTE — Telephone Encounter (Signed)
Form is completed and place in outgoing CMA box. Please retain a copy for the office

## 2023-03-21 ENCOUNTER — Encounter: Payer: Self-pay | Admitting: Nurse Practitioner

## 2023-03-24 ENCOUNTER — Ambulatory Visit (INDEPENDENT_AMBULATORY_CARE_PROVIDER_SITE_OTHER): Payer: Medicare Other

## 2023-03-24 VITALS — Ht 68.0 in | Wt 170.0 lb

## 2023-03-24 DIAGNOSIS — Z0000 Encounter for general adult medical examination without abnormal findings: Secondary | ICD-10-CM | POA: Diagnosis not present

## 2023-03-24 NOTE — Progress Notes (Signed)
Subjective:   James Pearson is a 77 y.o. male who presents for Medicare Annual/Subsequent preventive examination.  Visit Complete: Virtual  I connected with  [name] on 03/24/23 by a audio enabled telemedicine application and verified that I am speaking with the correct person using two identifiers.  Patient Location: Home  Provider Location: Home Office  I discussed the limitations of evaluation and management by telemedicine. The patient expressed understanding and agreed to proceed.  Vital Signs: Because this visit was a virtual/telehealth visit, some criteria may be missing or patient reported. Any vitals not documented were not able to be obtained and vitals that have been documented are patient reported.    Review of Systems      Cardiac Risk Factors include: advanced age (>92men, >42 women);hypertension;dyslipidemia;male gender;sedentary lifestyle     Objective:    Today's Vitals   03/24/23 1345  Weight: 170 lb (77.1 kg)  Height: 5\' 8"  (1.727 m)  PainSc: 6    Body mass index is 25.85 kg/m.     03/24/2023    1:55 PM 01/27/2023   10:37 AM 01/12/2023    2:39 PM 03/12/2022    1:14 PM 12/14/2021    2:03 PM 08/12/2014    3:41 AM  Advanced Directives  Does Patient Have a Medical Advance Directive? Yes Yes Yes Yes No No  Type of Estate agent of [place];Living will Living will Healthcare Power of [place];Living will Living will;Healthcare Power of Attorney    Does patient want to make changes to medical advance directive?  No - Patient declined  No - Patient declined    Copy of Healthcare Power of Attorney in Chart? No - copy requested No - copy requested  No - copy requested    Would patient like information on creating a medical advance directive? No - Patient declined    No - Patient declined No - patient declined information    Current Medications (verified) Outpatient Encounter Medications as of 03/24/2023  Medication Sig   amLODipine  (NORVASC) 5 MG tablet TAKE 1 TABLET BY MOUTH DAILY   aspirin 81 MG chewable tablet Chew 81 mg by mouth in the morning.   atorvastatin (LIPITOR) 80 MG tablet TAKE 1 TABLET BY MOUTH ONCE  DAILY (Patient taking differently: Take 80 mg by mouth at bedtime.)   BENFOTIAMINE PO Take 300 mg by mouth at bedtime.   CALCIUM-VITAMIN D PO Take by mouth. Calcium 600 mg and vitamin d 400 mg   clobetasol (TEMOVATE) 0.05 % external solution Apply 1 application topically 2 (two) times daily. Apply to affected areas on scalp until itchy rash clears/ PRN   cyclobenzaprine (FLEXERIL) 10 MG tablet Take 1 tablet (10 mg total) by mouth 3 (three) times daily as needed for muscle spasms.   docusate sodium (COLACE) 100 MG capsule Take 100 mg by mouth daily.   enalapril (VASOTEC) 20 MG tablet TAKE 2 TABLETS BY MOUTH ONCE  DAILY   Fluocinolone Acetonide Body 0.01 % OIL Apply to scalp, face and ears QD to BID (Patient taking differently: Apply 1 application  topically daily as needed (irritation).)   hydrochlorothiazide (HYDRODIURIL) 12.5 MG tablet TAKE 1 TABLET BY MOUTH DAILY   HYDROmorphone (DILAUDID) 2 MG tablet Take 1 tablet (2 mg total) by mouth every 4 (four) hours as needed for severe pain (prn severe post op pain not controlled by oxycodone).   Iron, Ferrous Sulfate, 325 (65 Fe) MG TABS Take 325 mg by mouth daily.   ketoconazole (NIZORAL)  2 % shampoo APPLY THREE TIMES WEEKLY, LET SIT SEVERAL MINUTES BEFORE RINSING. (Patient taking differently: Apply 1 application  topically as needed for irritation.)   metFORMIN (GLUCOPHAGE-XR) 500 MG 24 hr tablet Take 1 tablet (500 mg total) by mouth daily with breakfast.   Multiple Vitamins-Minerals (MULTIVITAMIN WITH MINERALS) tablet Take 1 tablet by mouth daily. Centrum silver 50+   ondansetron (ZOFRAN) 4 MG tablet Take 1 tablet (4 mg total) by mouth every 8 (eight) hours as needed for nausea or vomiting.   oxyCODONE (OXY IR/ROXICODONE) 5 MG immediate release tablet Take 5-40 mg by  mouth every 6 (six) hours as needed for severe pain.   sildenafil (VIAGRA) 100 MG tablet Take 50-100 mg by mouth daily as needed for erectile dysfunction.   Specialty Vitamins Products (PROSTATE PO) Take 250 mg by mouth at bedtime. supra beta prostate   tamsulosin (FLOMAX) 0.4 MG CAPS capsule TAKE 2 CAPSULES BY MOUTH DAILY   testosterone cypionate (DEPOTESTOSTERONE CYPIONATE) 200 MG/ML injection Inject 1 mL into the muscle every 28 (twenty-eight) days.   tobramycin-dexamethasone (TOBRADEX) ophthalmic solution Place 1 drop into the left eye daily as needed (eye infection).   traZODone (DESYREL) 50 MG tablet TAKE 1 TABLET BY MOUTH AT  BEDTIME AS NEEDED FOR SLEEP   TURMERIC PO Take 1 capsule by mouth daily as needed (Knee pain).   potassium chloride SA (KLOR-CON M) 20 MEQ tablet Take 2 tablets (40 mEq total) by mouth once for 1 dose. (Patient not taking: Reported on 01/12/2023)   No facility-administered encounter medications on file as of 03/24/2023.    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   Stroke (HCC) 08/10/2011   Past Surgical History:  Procedure Laterality Date   CHOLECYSTECTOMY     HAND SURGERY     9 subsequent surgeries   REVERSE SHOULDER ARTHROPLASTY Left 01/27/2023   Procedure: REVERSE SHOULDER ARTHROPLASTY;  Surgeon: Francena Hanly, MD;  Location: [facility];  Service: Orthopedics;  Laterality: Left;    rotator cuff surgery Right    Family History  Problem Relation Age of Onset   Heart disease Mother    Hypertension Mother    Tremor Father    COPD Sister    Heart disease Sister    Hypertension Sister    Social History    Socioeconomic History   Marital status: Widowed    Spouse name: Not on file   Number of children: Not on file   Years of education: Not on file   Highest education level: Not on file  Occupational History   Not on file  Tobacco Use   Smoking status: Former    Current packs/day: 0.00    Types: Cigarettes    Quit date: 08/09/1990    Years since quitting: 32.6   Smokeless tobacco: Never  Vaping Use   Vaping status: Never Used  Substance and Sexual Activity   Alcohol use: No    Alcohol/week: 0.0 standard drinks of alcohol   Drug use: No   Sexual activity: Yes  Other Topics Concern   Not on file  Social History Narrative   Caffeine rare.  So lost wife recently back  in 05/20/2021 (cancer).  Veteran.  Working : retired.     Social Determinants of Health   Financial Resource Strain: Low Risk  (03/24/2023)   Overall Financial Resource Strain (CARDIA)    Difficulty of Paying Living Expenses: Not hard at all  Food Insecurity: No Food Insecurity (03/24/2023)   Hunger Vital Sign    Worried About Running Out of Food in the Last Year: Never true    Ran Out of Food in the Last Year: Never true  Transportation Needs: No Transportation Needs (03/24/2023)   PRAPARE - Administrator, Civil Service (Medical): No    Lack of Transportation (Non-Medical): No  Physical Activity: Inactive (03/24/2023)   Exercise Vital Sign    Days of Exercise per Week: 0 days    Minutes of Exercise per Session: 0 min  Stress: No Stress Concern Present (03/24/2023)   [employer] of Occupational Health - Occupational Stress Questionnaire    Feeling of Stress : Not at all  Social Connections: Socially Integrated (03/24/2023)   Social Connection and Isolation Panel [NHANES]    Frequency of Communication with Friends and Family: More than three times a week    Frequency of Social Gatherings with Friends and Family: More than three times a week    Attends Religious Services: More than 4 times per year     Active Member of [employer] or Organizations: Yes    Attends Engineer, structural: More than 4 times per year    Marital Status: Living with partner    Tobacco Counseling Counseling given: Not Answered   Clinical Intake:  Pre-visit preparation completed: Yes  Pain : 0-10 Pain Score: 6  Pain Type: Acute pain (s/p shoulder surgery on 01/27/23) Pain Location: Shoulder Pain Orientation: Left Pain Descriptors / Indicators: Aching, Sharp Pain Onset: More than a month ago     BMI - recorded: 25.85 Nutritional Status: BMI 25 -29 Overweight Nutritional Risks: None Diabetes: No (borderline per pt)  How often do you need to have someone help you when you read instructions, pamphlets, or other written materials from your doctor or pharmacy?: 1 - Never  Interpreter Needed?: No  Information entered by :: C. LPN   Activities of Daily Living    03/24/2023    1:55 PM 01/12/2023    2:43 PM  In your present state of health, do you have any difficulty performing the following activities:  Hearing? 0   Vision? 0   Difficulty concentrating or making decisions? 1   Comment sometimes   Walking or climbing stairs? 0   Dressing or bathing? 1   Comment s/p shoulder surgery   Doing errands, shopping? 1 0  Comment friend assists   Quarry manager and eating ? Y   Using the Toilet? N   In the past six months, have you accidently leaked urine? Y   Comment occasionally   Do you have problems with loss of bowel control? N   Managing your Medications? N   Managing your Finances? N   Housekeeping or managing your Housekeeping? Y     Patient Care Team: Eden Emms, NP as PCP - General  Indicate any recent Medical Services you may have received from other than [facility] providers in the past year (date may be approximate).     Assessment:   This is a routine wellness examination for Pranith.  Hearing/Vision screen Hearing Screening - Comments:: Denies hearing difficulties    Vision Screening - Comments:: Contacts- UTD on  eye exams - Unknown provider  Dietary issues and exercise activities discussed:     Goals Addressed             This Visit's Progress    Patient Stated       Get shoulder healed and get off prediabetic list.       Depression Screen    03/24/2023    1:50 PM 10/28/2022   12:20 PM 12/14/2021    1:57 PM 10/09/2021    4:11 PM 08/26/2021    3:46 PM 11/03/2020   10:01 AM 01/22/2019   12:42 PM  PHQ 2/9 Scores  PHQ - 2 Score 0 0 0 6 4 0 0  PHQ- 9 Score  4  21 14  1     Fall Risk    03/24/2023    1:53 PM 12/08/2022   11:02 AM 12/14/2021    2:02 PM 03/18/2021    2:15 PM 01/22/2019   12:42 PM  Fall Risk   Falls in the past year? 0 0 0 0 0  Number falls in past yr: 0 0 0 0   Injury with Fall? 0 0 0 0   Risk for fall due to : No Fall Risks No Fall Risks No Fall Risks    Follow up Falls prevention discussed;Falls evaluation completed Falls evaluation completed       MEDICARE RISK AT HOME:  Medicare Risk at Home - 03/24/23 1357     Any stairs in or around the home? No    If so, are there any without handrails? No    Home free of loose throw rugs in walkways, pet beds, electrical cords, etc? Yes    Adequate lighting in your home to reduce risk of falls? Yes    Life alert? No    Use of a cane, walker or w/c? No    Grab bars in the bathroom? Yes    Shower chair or bench in shower? Yes    Elevated toilet seat or a handicapped toilet? Yes             TIMED UP AND GO:  Was the test performed?  No    Cognitive Function:    07/23/2022    3:36 PM  MMSE - Mini Mental State Exam  Orientation to time 5  Orientation to Place 5  Registration 3  Attention/ Calculation 4  Recall 3  Language- name 2 objects 2  Language- repeat 1  Language- follow 3 step command 3  Language- read & follow direction 1  Write a sentence 0  Copy design 1  Total score 28        03/24/2023    1:57 PM 12/14/2021    2:03 PM  6CIT Screen  What Year?  0 points 0 points  What month? 0 points 0 points  What time? 0 points 0 points  Count back from 20 0 points 0 points  Months in reverse 0 points 0 points  Repeat phrase 0 points 0 points  Total Score 0 points 0 points    Immunizations Immunization History  Administered Date(s) Administered   COVID-19, mRNA, vaccine(Comirnaty)12 years and older 06/02/2022, 11/17/2022   Fluad Quad(high Dose 65+) 04/29/2020, 05/14/2021, 05/11/2022   Hep A / Hep B 11/28/2009, 04/30/2010   Influenza, High Dose Seasonal PF 09/03/2022   Influenza, Seasonal, Injecte, Preservative Fre 05/03/2016   Influenza-Unspecified 05/13/2015   PFIZER Comirnaty(Gray Top)Covid-19 Tri-Sucrose Vaccine 11/21/2020   PFIZER(Purple Top)SARS-COV-2 Vaccination 09/26/2019, 10/18/2019, 05/08/2020  Research officer, trade union 52yrs & up 05/14/2021, 02/18/2022   Pneumococcal Conjugate-13 07/27/2013   Pneumococcal Polysaccharide-23 09/19/2001, 10/31/2014   Rsv, Bivalent, Protein Subunit Rsvpref,pf Verdis Frederickson) 11/17/2022   Td 03/26/2016   Td (Adult), 2 Lf Tetanus Toxid, Preservative Free 03/26/2016   Zoster Recombinant(Shingrix) 08/25/2018, 05/15/2019   Zoster, Live 06/13/2014    TDAP status: Up to date  Flu Vaccine status: Due, Education has been provided regarding the importance of this vaccine. Advised may receive this vaccine at local pharmacy or Health Dept. Aware to provide a copy of the vaccination record if obtained from local pharmacy or Health Dept. Verbalized acceptance and understanding.  Pneumococcal vaccine status: Up to date  Covid-19 vaccine status: Information provided on how to obtain vaccines.   Qualifies for Shingles Vaccine? Yes   Zostavax completed Yes   Shingrix Completed?: Yes  Screening Tests Health Maintenance  Topic Date Due   Hepatitis C Screening  Never done   FOOT EXAM  10/10/2022   INFLUENZA VACCINE  03/10/2023   OPHTHALMOLOGY EXAM  05/12/2023   HEMOGLOBIN A1C  08/24/2023    Diabetic kidney evaluation - Urine ACR  10/28/2023   Diabetic kidney evaluation - eGFR measurement  01/12/2024   Medicare Annual Wellness (AWV)  03/23/2024   DTaP/Tdap/Td (3 - Tdap) 03/26/2026   Colonoscopy  12/04/2031   Pneumonia Vaccine 78+ Years old  Completed   Zoster Vaccines- Shingrix  Completed   HPV VACCINES  Aged Out   COVID-19 Vaccine  Discontinued    Health Maintenance  Health Maintenance Due  Topic Date Due   Hepatitis C Screening  Never done   FOOT EXAM  10/10/2022   INFLUENZA VACCINE  03/10/2023    Colorectal cancer screening: No longer required.   Lung Cancer Screening: (Low Dose CT Chest recommended if Age 28-80 years, 20 pack-year currently smoking OR have quit w/in 15years.) does not qualify.   Lung Cancer Screening Referral:    Additional Screening:  Hepatitis C Screening: does qualify; Completed DUE  Vision Screening: Recommended annual ophthalmology exams for early detection of glaucoma and other disorders of the eye. Is the patient up to date with their annual eye exam?  Yes  Who is the provider or what is the name of the office in which the patient attends annual eye exams? Unknown Provider If pt is not established with a provider, would they like to be referred to a provider to establish care? Yes .   Dental Screening: Recommended annual dental exams for proper oral hygiene   Community Resource Referral / Chronic Care Management: CRR required this visit?  No   CCM required this visit?  No     Plan:     I have personally reviewed and noted the following in the patient's chart:   Medical and social history Use of alcohol, tobacco or illicit drugs  Current medications and supplements including opioid prescriptions. Patient is currently taking opioid prescriptions. Information provided to patient regarding non-opioid alternatives. Patient advised to discuss non-opioid treatment plan with their provider. Functional ability and  status Nutritional status Physical activity Advanced directives List of other physicians Hospitalizations, surgeries, and ER visits in previous 12 months Vitals Screenings to include cognitive, depression, and falls Referrals and appointments  In addition, I have reviewed and discussed with patient certain preventive protocols, quality metrics, and best practice recommendations. A written personalized care plan for preventive services as well as general preventive health recommendations were provided to patient.      Linus Orn,  LPN   [id number]   After Visit Summary: (MyChart) Due to this being a telephonic visit, the after visit summary with patients personalized plan was offered to patient via MyChart   Nurse Notes: none

## 2023-03-24 NOTE — Patient Instructions (Addendum)
James Pearson , Thank you for taking time to come for your Medicare Wellness Visit. I appreciate your ongoing commitment to your health goals. Please review the following plan we discussed and let me know if I can assist you in the future.   Referrals/Orders/Follow-Ups/Clinician Recommendations: Aim for 30 minutes of exercise or brisk walking, 6-8 glasses of water, and 5 servings of fruits and vegetables each day.   Managing Pain Without Opioids Opioids are strong medicines used to treat moderate to severe pain. For some people, especially those who have long-term (chronic) pain, opioids may not be the best choice for pain management due to: Side effects like nausea, constipation, and sleepiness. The risk of addiction (opioid use disorder). The longer you take opioids, the greater your risk of addiction. Pain that lasts for more than 3 months is called chronic pain. Managing chronic pain usually requires more than one approach and is often provided by a team of health care providers working together (multidisciplinary approach). Pain management may be done at a pain management center or pain clinic. How to manage pain without the use of opioids Use non-opioid medicines Non-opioid medicines for pain may include: Over-the-counter or prescription non-steroidal anti-inflammatory drugs (NSAIDs). These may be the first medicines used for pain. They work well for muscle and bone pain, and they reduce swelling. Acetaminophen. This over-the-counter medicine may work well for milder pain but not swelling. Antidepressants. These may be used to treat chronic pain. A certain type of antidepressant (tricyclics) is often used. These medicines are given in lower doses for pain than when used for depression. Anticonvulsants. These are usually used to treat seizures but may also reduce nerve (neuropathic) pain. Muscle relaxants. These relieve pain caused by sudden muscle tightening (spasms). You may also use a pain  medicine that is applied to the skin as a patch, cream, or gel (topical analgesic), such as a numbing medicine. These may cause fewer side effects than medicines taken by mouth. Do certain therapies as directed Some therapies can help with pain management. They include: Physical therapy. You will do exercises to gain strength and flexibility. A physical therapist may teach you exercises to move and stretch parts of your body that are weak, stiff, or painful. You can learn these exercises at physical therapy visits and practice them at home. Physical therapy may also involve: Massage. Heat wraps or applying heat or cold to affected areas. Electrical signals that interrupt pain signals (transcutaneous electrical nerve stimulation, TENS). Weak lasers that reduce pain and swelling (low-level laser therapy). Signals from your body that help you learn to regulate pain (biofeedback). Occupational therapy. This helps you to learn ways to function at home and work with less pain. Recreational therapy. This involves trying new activities or hobbies, such as a physical activity or drawing. Mental health therapy, including: Cognitive behavioral therapy (CBT). This helps you learn coping skills for dealing with pain. Acceptance and commitment therapy (ACT) to change the way you think and react to pain. Relaxation therapies, including muscle relaxation exercises and mindfulness-based stress reduction. Pain management counseling. This may be individual, family, or group counseling.  Receive medical treatments Medical treatments for pain management include: Nerve block injections. These may include a pain blocker and anti-inflammatory medicines. You may have injections: Near the spine to relieve chronic back or neck pain. Into joints to relieve back or joint pain. Into nerve areas that supply a painful area to relieve body pain. Into muscles (trigger point injections) to relieve some painful muscle  conditions. A medical device placed near your spine to help block pain signals and relieve nerve pain or chronic back pain (spinal cord stimulation device). Acupuncture. Follow these instructions at home Medicines Take over-the-counter and prescription medicines only as told by your health care provider. If you are taking pain medicine, ask your health care providers about possible side effects to watch out for. Do not drive or use heavy machinery while taking prescription opioid pain medicine. Lifestyle  Do not use drugs or alcohol to reduce pain. If you drink alcohol, limit how much you have to: 0-1 drink a day for women who are not pregnant. 0-2 drinks a day for men. Know how much alcohol is in a drink. In the [place] one drink equals one 12 oz bottle of beer (355 mL), one 5 oz glass of wine (148 mL), or one 1 oz glass of hard liquor (44 mL). Do not use any products that contain nicotine or tobacco. These products include cigarettes, chewing tobacco, and vaping devices, such as e-cigarettes. If you need help quitting, ask your health care provider. Eat a healthy diet and maintain a healthy weight. Poor diet and excess weight may make pain worse. Eat foods that are high in fiber. These include fresh fruits and vegetables, whole grains, and beans. Limit foods that are high in fat and processed sugars, such as fried and sweet foods. Exercise regularly. Exercise lowers stress and may help relieve pain. Ask your health care provider what activities and exercises are safe for you. If your health care provider approves, join an exercise class that combines movement and stress reduction. Examples include yoga and tai chi. Get enough sleep. Lack of sleep may make pain worse. Lower stress as much as possible. Practice stress reduction techniques as told by your therapist. General instructions Work with all your pain management providers to find the treatments that work best for you. You are an  important member of your pain management team. There are many things you can do to reduce pain on your own. Consider joining an online or in-person support group for people who have chronic pain. Keep all follow-up visits. This is important. Where to find more information You can find more information about managing pain without opioids from: American Academy of Pain Medicine: painmed.org Institute for Chronic Pain: instituteforchronicpain.org American Chronic Pain Association: theacpa.org Contact a health care provider if: You have side effects from pain medicine. Your pain gets worse or does not get better with treatments or home therapy. You are struggling with anxiety or depression. Summary Many types of pain can be managed without opioids. Chronic pain may respond better to pain management without opioids. Pain is best managed when you and a team of health care providers work together. Pain management without opioids may include non-opioid medicines, medical treatments, physical therapy, mental health therapy, and lifestyle changes. Tell your health care providers if your pain gets worse or is not being managed well enough. This information is not intended to replace advice given to you by your health care provider. Make sure you discuss any questions you have with your health care provider. Document Revised: 11/05/2020 Document Reviewed: 11/05/2020 Elsevier Patient Education  2024 [employer]   This is a list of the screening recommended for you and due dates:  Health Maintenance  Topic Date Due   Hepatitis C Screening  Never done   Complete foot exam   10/10/2022   Medicare Annual Wellness Visit  12/15/2022   Flu Shot  03/10/2023  Eye exam for diabetics  05/12/2023   Hemoglobin A1C  08/24/2023   Yearly kidney health urinalysis for diabetes  10/28/2023   Yearly kidney function blood test for diabetes  01/12/2024   DTaP/Tdap/Td vaccine (3 - Tdap) 03/26/2026   Colon Cancer  Screening  12/04/2031   Pneumonia Vaccine  Completed   Zoster (Shingles) Vaccine  Completed   HPV Vaccine  Aged Out   COVID-19 Vaccine  Discontinued    Advanced directives: (Copy Requested) Please bring a copy of your health care power of attorney and living will to the office to be added to your chart at your convenience.  Next Medicare Annual Wellness Visit scheduled for next year: Yes  Preventive Care 29 Years and Older, Male  Preventive care refers to lifestyle choices and visits with your health care provider that can promote health and wellness. What does preventive care include? A yearly physical exam. This is also called an annual well check. Dental exams once or twice a year. Routine eye exams. Ask your health care provider how often you should have your eyes checked. Personal lifestyle choices, including: Daily care of your teeth and gums. Regular physical activity. Eating a healthy diet. Avoiding tobacco and drug use. Limiting alcohol use. Practicing safe sex. Taking low doses of aspirin every day. Taking vitamin and mineral supplements as recommended by your health care provider. What happens during an annual well check? The services and screenings done by your health care provider during your annual well check will depend on your age, overall health, lifestyle risk factors, and family history of disease. Counseling  Your health care provider may ask you questions about your: Alcohol use. Tobacco use. Drug use. Emotional well-being. Home and relationship well-being. Sexual activity. Eating habits. History of falls. Memory and ability to understand (cognition). Work and work Astronomer. Screening  You may have the following tests or measurements: Height, weight, and BMI. Blood pressure. Lipid and cholesterol levels. These may be checked every 5 years, or more frequently if you are over 39 years old. Skin check. Lung cancer screening. You may have this  screening every year starting at age 18 if you have a 30-pack-year history of smoking and currently smoke or have quit within the past 15 years. Fecal occult blood test (FOBT) of the stool. You may have this test every year starting at age 36. Flexible sigmoidoscopy or colonoscopy. You may have a sigmoidoscopy every 5 years or a colonoscopy every 10 years starting at age 24. Prostate cancer screening. Recommendations will vary depending on your family history and other risks. Hepatitis C blood test. Hepatitis B blood test. Sexually transmitted disease (STD) testing. Diabetes screening. This is done by checking your blood sugar (glucose) after you have not eaten for a while (fasting). You may have this done every 1-3 years. Abdominal aortic aneurysm (AAA) screening. You may need this if you are a current or former smoker. Osteoporosis. You may be screened starting at age 20 if you are at high risk. Talk with your health care provider about your test results, treatment options, and if necessary, the need for more tests. Vaccines  Your health care provider may recommend certain vaccines, such as: Influenza vaccine. This is recommended every year. Tetanus, diphtheria, and acellular pertussis (Tdap, Td) vaccine. You may need a Td booster every 10 years. Zoster vaccine. You may need this after age 34. Pneumococcal 13-valent conjugate (PCV13) vaccine. One dose is recommended after age 70. Pneumococcal polysaccharide (PPSV23) vaccine. One dose is recommended after  age 26. Talk to your health care provider about which screenings and vaccines you need and how often you need them. This information is not intended to replace advice given to you by your health care provider. Make sure you discuss any questions you have with your health care provider. Document Released: 08/22/2015 Document Revised: 04/14/2016 Document Reviewed: 05/27/2015 Elsevier Interactive Patient Education  2017 [employer].  Fall  Prevention in the Home Falls can cause injuries. They can happen to people of all ages. There are many things you can do to make your home safe and to help prevent falls. What can I do on the outside of my home? Regularly fix the edges of walkways and driveways and fix any cracks. Remove anything that might make you trip as you walk through a door, such as a raised step or threshold. Trim any bushes or trees on the path to your home. Use bright outdoor lighting. Clear any walking paths of anything that might make someone trip, such as rocks or tools. Regularly check to see if handrails are loose or broken. Make sure that both sides of any steps have handrails. Any raised decks and porches should have guardrails on the edges. Have any leaves, snow, or ice cleared regularly. Use sand or salt on walking paths during winter. Clean up any spills in your garage right away. This includes oil or grease spills. What can I do in the bathroom? Use night lights. Install grab bars by the toilet and in the tub and shower. Do not use towel bars as grab bars. Use non-skid mats or decals in the tub or shower. If you need to sit down in the shower, use a plastic, non-slip stool. Keep the floor dry. Clean up any water that spills on the floor as soon as it happens. Remove soap buildup in the tub or shower regularly. Attach bath mats securely with double-sided non-slip rug tape. Do not have throw rugs and other things on the floor that can make you trip. What can I do in the bedroom? Use night lights. Make sure that you have a light by your bed that is easy to reach. Do not use any sheets or blankets that are too big for your bed. They should not hang down onto the floor. Have a firm chair that has side arms. You can use this for support while you get dressed. Do not have throw rugs and other things on the floor that can make you trip. What can I do in the kitchen? Clean up any spills right away. Avoid  walking on wet floors. Keep items that you use a lot in easy-to-reach places. If you need to reach something above you, use a strong step stool that has a grab bar. Keep electrical cords out of the way. Do not use floor polish or wax that makes floors slippery. If you must use wax, use non-skid floor wax. Do not have throw rugs and other things on the floor that can make you trip. What can I do with my stairs? Do not leave any items on the stairs. Make sure that there are handrails on both sides of the stairs and use them. Fix handrails that are broken or loose. Make sure that handrails are as long as the stairways. Check any carpeting to make sure that it is firmly attached to the stairs. Fix any carpet that is loose or worn. Avoid having throw rugs at the top or bottom of the stairs. If you do  have throw rugs, attach them to the floor with carpet tape. Make sure that you have a light switch at the top of the stairs and the bottom of the stairs. If you do not have them, ask someone to add them for you. What else can I do to help prevent falls? Wear shoes that: Do not have high heels. Have rubber bottoms. Are comfortable and fit you well. Are closed at the toe. Do not wear sandals. If you use a stepladder: Make sure that it is fully opened. Do not climb a closed stepladder. Make sure that both sides of the stepladder are locked into place. Ask someone to hold it for you, if possible. Clearly mark and make sure that you can see: Any grab bars or handrails. First and last steps. Where the edge of each step is. Use tools that help you move around (mobility aids) if they are needed. These include: Canes. Walkers. Scooters. Crutches. Turn on the lights when you go into a dark area. Replace any light bulbs as soon as they burn out. Set up your furniture so you have a clear path. Avoid moving your furniture around. If any of your floors are uneven, fix them. If there are any pets around  you, be aware of where they are. Review your medicines with your doctor. Some medicines can make you feel dizzy. This can increase your chance of falling. Ask your doctor what other things that you can do to help prevent falls. This information is not intended to replace advice given to you by your health care provider. Make sure you discuss any questions you have with your health care provider. Document Released: 05/22/2009 Document Revised: 01/01/2016 Document Reviewed: 08/30/2014 Elsevier Interactive Patient Education  2017 [employer].

## 2023-05-02 ENCOUNTER — Other Ambulatory Visit (HOSPITAL_COMMUNITY): Payer: Self-pay | Admitting: Orthopedic Surgery

## 2023-05-02 DIAGNOSIS — Z96612 Presence of left artificial shoulder joint: Secondary | ICD-10-CM

## 2023-05-02 DIAGNOSIS — Z96611 Presence of right artificial shoulder joint: Secondary | ICD-10-CM

## 2023-05-09 ENCOUNTER — Ambulatory Visit (HOSPITAL_COMMUNITY)
Admission: RE | Admit: 2023-05-09 | Discharge: 2023-05-09 | Disposition: A | Discharge status: Home / Self Care | Payer: No Typology Code available for payment source | Source: Ambulatory Visit | Attending: Orthopedic Surgery | Admitting: Orthopedic Surgery

## 2023-05-09 DIAGNOSIS — Z96612 Presence of left artificial shoulder joint: Secondary | ICD-10-CM | POA: Insufficient documentation

## 2023-05-09 DIAGNOSIS — Z96611 Presence of right artificial shoulder joint: Secondary | ICD-10-CM | POA: Insufficient documentation

## 2023-05-09 MED ORDER — TECHNETIUM TC 99M MEDRONATE IV KIT
20.0000 | PACK | Freq: Once | INTRAVENOUS | Status: AC | PRN
  Administered 2023-05-09: 20 via INTRAVENOUS

## 2023-05-23 ENCOUNTER — Ambulatory Visit (INDEPENDENT_AMBULATORY_CARE_PROVIDER_SITE_OTHER): Payer: Medicare Other | Admitting: Internal Medicine

## 2023-05-23 VITALS — BP 132/80 | HR 83 | Resp 14 | Ht 68.0 in | Wt 174.0 lb

## 2023-05-23 DIAGNOSIS — G4700 Insomnia, unspecified: Secondary | ICD-10-CM | POA: Diagnosis not present

## 2023-05-23 DIAGNOSIS — R0683 Snoring: Secondary | ICD-10-CM | POA: Insufficient documentation

## 2023-05-23 DIAGNOSIS — M25561 Pain in right knee: Secondary | ICD-10-CM | POA: Diagnosis not present

## 2023-05-23 NOTE — Progress Notes (Unsigned)
Sleep Medicine   Office Visit  Patient Name: James Pearson DOB: 1946/05/14 MRN [id number]    Chief Complaint: snoring  Brief History:  James Pearson presents for an initial sleep evaluation with a 3 year history of snoring.   Sleep quality is good. This is noted most nights. The patient's bed partner reports witnessed apnea and snoring at night. The patient relates the following symptoms: excessive daytime sleepiness, snoring, and gasping are also present. The patient goes to sleep at 1 am and wakes up at 6-7 am.   Sleep quality is worse when outside home environment.  Patient has noted no restlessness of his legs at night.  The patient  relates no unusual behavior during the night.  The patient denies a history of psychiatric problems. The Epworth Sleepiness Score is 4 out of 24 .  The patient relates  Cardiovascular risk factors include: hypertension, cva.  The patient reports his wife told him he would stop breathing during the night, but this was a few years ago. He had a normal home sleep study.     ROS  General: (-) fever, (-) chills, (-) night sweat Nose and Sinuses: (-) nasal stuffiness or itchiness, (-) postnasal drip, (-) nosebleeds, (-) sinus trouble. Mouth and Throat: (-) sore throat, (-) hoarseness. Neck: (-) swollen glands, (-) enlarged thyroid, (-) neck pain. Respiratory: - cough, - shortness of breath, - wheezing. Neurologic: - numbness, - tingling. Psychiatric: - anxiety, - depression Sleep behavior: -sleep paralysis -hypnogogic hallucinations -dream enactment      -vivid dreams -cataplexy -night terrors -sleep walking   Current Medication: Outpatient Encounter Medications as of 05/23/2023  Medication Sig   amLODipine (NORVASC) 5 MG tablet TAKE 1 TABLET BY MOUTH DAILY   aspirin 81 MG chewable tablet Chew 81 mg by mouth in the morning.   atorvastatin (LIPITOR) 80 MG tablet TAKE 1 TABLET BY MOUTH ONCE  DAILY (Patient taking differently: Take 80 mg by mouth at bedtime.)    BENFOTIAMINE PO Take 300 mg by mouth at bedtime.   CALCIUM-VITAMIN D PO Take by mouth. Calcium 600 mg and vitamin d 400 mg   clobetasol (TEMOVATE) 0.05 % external solution Apply 1 application topically 2 (two) times daily. Apply to affected areas on scalp until itchy rash clears/ PRN   cyclobenzaprine (FLEXERIL) 10 MG tablet Take 1 tablet (10 mg total) by mouth 3 (three) times daily as needed for muscle spasms.   docusate sodium (COLACE) 100 MG capsule Take 100 mg by mouth daily.   enalapril (VASOTEC) 20 MG tablet TAKE 2 TABLETS BY MOUTH ONCE  DAILY   Fluocinolone Acetonide Body 0.01 % OIL Apply to scalp, face and ears QD to BID (Patient taking differently: Apply 1 application  topically daily as needed (irritation).)   hydrochlorothiazide (HYDRODIURIL) 12.5 MG tablet TAKE 1 TABLET BY MOUTH DAILY   Iron, Ferrous Sulfate, 325 (65 Fe) MG TABS Take 325 mg by mouth daily.   ketoconazole (NIZORAL) 2 % shampoo APPLY THREE TIMES WEEKLY, LET SIT SEVERAL MINUTES BEFORE RINSING. (Patient taking differently: Apply 1 application  topically as needed for irritation.)   metFORMIN (GLUCOPHAGE-XR) 500 MG 24 hr tablet Take 1 tablet (500 mg total) by mouth daily with breakfast.   Multiple Vitamins-Minerals (MULTIVITAMIN WITH MINERALS) tablet Take 1 tablet by mouth daily. Centrum silver 50+   ondansetron (ZOFRAN) 4 MG tablet Take 1 tablet (4 mg total) by mouth every 8 (eight) hours as needed for nausea or vomiting.   oxycodone (OXY-IR) 5 MG capsule Take 5  mg by mouth every 4 (four) hours as needed.   potassium chloride SA (KLOR-CON M) 20 MEQ tablet Take 2 tablets (40 mEq total) by mouth once for 1 dose.   sildenafil (VIAGRA) 100 MG tablet Take 50-100 mg by mouth daily as needed for erectile dysfunction.   Specialty Vitamins Products (PROSTATE PO) Take 250 mg by mouth at bedtime. supra beta prostate   tamsulosin (FLOMAX) 0.4 MG CAPS capsule TAKE 2 CAPSULES BY MOUTH DAILY   testosterone cypionate (DEPOTESTOSTERONE  CYPIONATE) 200 MG/ML injection Inject 1 mL into the muscle every 28 (twenty-eight) days.   tobramycin-dexamethasone (TOBRADEX) ophthalmic solution Place 1 drop into the left eye daily as needed (eye infection).   traZODone (DESYREL) 50 MG tablet TAKE 1 TABLET BY MOUTH AT  BEDTIME AS NEEDED FOR SLEEP   TURMERIC PO Take 1 capsule by mouth daily as needed (Knee pain).   [DISCONTINUED] HYDROmorphone (DILAUDID) 2 MG tablet Take 1 tablet (2 mg total) by mouth every 4 (four) hours as needed for severe pain (prn severe post op pain not controlled by oxycodone).   [DISCONTINUED] oxyCODONE (OXY IR/ROXICODONE) 5 MG immediate release tablet Take 5-40 mg by mouth every 6 (six) hours as needed for severe pain.   No facility-administered encounter medications on file as of 05/23/2023.    Surgical History: Past Surgical History:  Procedure Laterality Date   CHOLECYSTECTOMY     HAND SURGERY     9 subsequent surgeries   REVERSE SHOULDER ARTHROPLASTY Left 01/27/2023   Procedure: REVERSE SHOULDER ARTHROPLASTY;  Surgeon: Francena Hanly, MD;  Location: [facility];  Service: Orthopedics;  Laterality: Left;    rotator cuff surgery Right     Medical History: Past Medical History:  Diagnosis Date   Arthritis    Diabetes mellitus without complication (HCC)    GERD (gastroesophageal reflux disease)    Hepatitis    Hypertension    Parkinson's disease    Positive TB test    as a child   Stroke (HCC) 08/10/2011    Family History: Non contributory to the present illness  Social History: Social History   Socioeconomic History   Marital status: Widowed    Spouse name: Not on file   Number of children: Not on file   Years of education: Not on file   Highest education level: Not on file  Occupational History   Not on file  Tobacco Use   Smoking status: Former    Current packs/day: 0.00    Types: Cigarettes    Quit date: 08/09/1990    Years since quitting: 32.8   Smokeless tobacco: Never  Vaping  Use   Vaping status: Never Used  Substance and Sexual Activity   Alcohol use: No    Alcohol/week: 0.0 standard drinks of alcohol   Drug use: No   Sexual activity: Yes  Other Topics Concern   Not on file  Social History Narrative   Caffeine rare.  So lost wife recently back in 05/20/2021 (cancer).  Veteran.  Working : retired.     Social Determinants of Health   Financial Resource Strain: Low Risk  (03/24/2023)   Overall Financial Resource Strain (CARDIA)    Difficulty of Paying Living Expenses: Not hard at all  Food Insecurity: No Food Insecurity (03/24/2023)   Hunger Vital Sign    Worried About Running Out of Food in the Last Year: Never true    Ran Out of Food in the Last Year: Never true  Transportation Needs: No Transportation Needs (  03/24/2023)   PRAPARE - Administrator, Civil Service (Medical): No    Lack of Transportation (Non-Medical): No  Physical Activity: Inactive (03/24/2023)   Exercise Vital Sign    Days of Exercise per Week: 0 days    Minutes of Exercise per Session: 0 min  Stress: No Stress Concern Present (03/24/2023)   [employer] of Occupational Health - Occupational Stress Questionnaire    Feeling of Stress : Not at all  Social Connections: Socially Integrated (03/24/2023)   Social Connection and Isolation Panel [NHANES]    Frequency of Communication with Friends and Family: More than three times a week    Frequency of Social Gatherings with Friends and Family: More than three times a week    Attends Religious Services: More than 4 times per year    Active Member of [employer] or Organizations: Yes    Attends Banker Meetings: More than 4 times per year    Marital Status: Living with partner  Intimate Partner Violence: Not At Risk (03/24/2023)   Humiliation, Afraid, Rape, and Kick questionnaire    Fear of Current or Ex-Partner: No    Emotionally Abused: No    Physically Abused: No    Sexually Abused: No    Vital Signs: Blood  pressure 132/80, pulse 83, resp. rate 14, height 5\' 8"  (1.727 m), weight 174 lb (78.9 kg), SpO2 95%. Body mass index is 26.46 kg/m.   Examination: General Appearance: The patient is well-developed, well-nourished, and in no distress. Neck Circumference: 38 cm Skin: Gross inspection of skin unremarkable. Head: normocephalic, no gross deformities. Eyes: no gross deformities noted. ENT: ears appear grossly normal Neurologic: Alert and oriented. No involuntary movements.    STOP BANG RISK ASSESSMENT S (snore) Have you been told that you snore?     YES   T (tired) Are you often tired, fatigued, or sleepy during the day?   YES  O (obstruction) Do you stop breathing, choke, or gasp during sleep? YES   P (pressure) Do you have or are you being treated for high blood pressure? YES   B (BMI) Is your body index greater than 35 kg/m? NO   A (age) Are you 1 years old or older? YES   N (neck) Do you have a neck circumference greater than 16 inches?   NO   G (gender) Are you a male? YES   TOTAL STOP/BANG "YES" ANSWERS 6                                                               A STOP-Bang score of 2 or less is considered low risk, and a score of 5 or more is high risk for having either moderate or severe OSA. For people who score 3 or 4, doctors may need to perform further assessment to determine how likely they are to have OSA.         EPWORTH SLEEPINESS SCALE:  Scale:  (0)= no chance of dozing; (1)= slight chance of dozing; (2)= moderate chance of dozing; (3)= high chance of dozing  Chance  Situtation    Sitting and reading: 2    Watching TV: 0    Sitting Inactive in public: 0    As a passenger in car: 0  Lying down to rest: 2    Sitting and talking: 0    Sitting quielty after lunch: 0    In a car, stopped in traffic: 0   TOTAL SCORE:   4 out of 24    SLEEP STUDIES:  10/31/2022- HST- AHI 1,  RDI 4 Negative for OSA   LABS: No results found for this  or any previous visit (from the past 2160 hour(s)).  Radiology: No results found.  No results found.  No results found.    Assessment and Plan: Patient Active Problem List   Diagnosis Date Noted   Snoring 05/23/2023   Preoperative clearance 01/19/2023   Callus 10/28/2022   Isolated memory impairment 07/23/2022   Lightheadedness 07/07/2022   Medication refill 05/11/2022   Pain of left eye 05/11/2022   Red eye 05/11/2022   Lung nodule seen on imaging study 04/26/2022   Lung nodule 04/26/2022   Abnormal chest x-ray 04/26/2022   DOE (dyspnea on exertion) 04/22/2022   Acute non-recurrent maxillary sinusitis 04/08/2022   High risk medication use 03/22/2022   Elevated hemoglobin (HCC) 03/22/2022   Former smoker 01/28/2022   Unexplained weight loss 01/28/2022   Bilateral edema of lower extremity 10/09/2021   Pain, dental 10/09/2021   Preventative health care 10/09/2021   Tremor 09/18/2021   Left hip pain 09/18/2021   Anxiety and depression 08/26/2021   Chronic pain of right knee 08/26/2021   Grief 08/26/2021   Rotator cuff tear arthropathy 06/23/2021   Psychosexual dysfunction with inhibited sexual excitement 06/23/2021   Obesity 06/23/2021   Benign neoplasm of colon 06/23/2021   Carpal tunnel syndrome 06/23/2021   History of adenomatous polyp of colon 06/23/2021   Brachial neuritis 06/23/2021   Parkinson's disease (HCC) 06/23/2021   Dysuria 03/18/2021   Low testosterone in male 03/18/2021   Hypokalemia 01/06/2021   Pain in left shoulder 11/05/2020   Bilateral primary osteoarthritis of knee 12/19/2019   Unilateral primary osteoarthritis, right knee 10/30/2019   Insomnia 05/06/2015   Essential hypertension 05/06/2015   Chronic back pain 05/06/2015   Type 2 diabetes mellitus without complication (HCC) 05/06/2015   Stroke (HCC) 05/06/2015   HLD (hyperlipidemia) 05/06/2015   BPH (benign prostatic hyperplasia) 05/06/2015   Esophageal reflux 05/06/2015   History of TB  (tuberculosis) 05/06/2015   Iron deficiency 04/10/2015    1. Snoring Patient evaluation suggests high risk of sleep disordered breathing due to witnessed apnea and snoring.  Patient has comorbid cardiovascular risk factors including: hypertension and stroke  which could be exacerbated by pathologic sleep-disordered breathing.  Suggest: PSG  to assess/treat the patient's sleep disordered breathing. The patient was also counselled on weight loss to optimize sleep health.  2. Insomnia, unspecified type Doing better of late. Continue with good sleep hygiene. His Epworth score is normal and although he has some drowsiness in the daytime he attributes this to boredom such as after his shoulder surgery when he couldn't do anything other than watch tv.  Methods to improve your sleep habits -Keep a consistent wind down period before your bedtime to help you relax. Do not do work-related activities around bedtime.  -Go to bed only when you are sleepy.  -Get out of bed at the same time every morning, even on holidays or weekends.  -If you haven't fallen asleep after lying in bed in 20 minutes, do something boring and relaxing with minimal light expo sure outside the bedroom. Return to bed when you are sleepy.  -While in bed, turn the  clock away from you. It'll only remind you of something you probably don't want to know.  -No napping during the day, especially after 3 pm. If a nap is unavoidable, keep it under an hour.  -Your bedroom should be quiet and dark.  -Take a warm, relaxing bath or shower! A warm bath or shower prior to bedtime increasesyour body's temperature so the subsequent cooling off period can help induce sleep.  -Exercise regularly, but not tough exercise within 6 hours or bedtime.  -Avoid eating large or fatty meals near bedtime.  -Avoid caffeine after lunch.    1 - Portions taken from http://yoursleep.HipsReplacement.fr.aspx     General Counseling: I have discussed the  findings of the evaluation and examination with Dorinda Hill.  I have also discussed any further diagnostic evaluation thatmay be needed or ordered today. Codylee verbalizes understanding of the findings of todays visit. We also reviewed his medications today and discussed drug interactions and side effects including but not limited excessive drowsiness and altered mental states. We also discussed that there is always a risk not just to him but also people around him. he has been encouraged to call the office with any questions or concerns that should arise related to todays visit.  No orders of the defined types were placed in this encounter.       I have personally obtained a history, evaluated the patient, evaluated pertinent data, formulated the assessment and plan and placed orders.  This patient was seen today by Emmaline Kluver, PA-C in collaboration with Dr. Freda Munro.    Yevonne Pax, MD [facility] Diplomate ABMS Pulmonary and Critical Care Medicine Sleep medicine

## 2023-05-28 ENCOUNTER — Other Ambulatory Visit: Payer: Self-pay | Admitting: Nurse Practitioner

## 2023-05-29 ENCOUNTER — Telehealth: Payer: Medicare Other | Admitting: Physician Assistant

## 2023-05-29 DIAGNOSIS — M5440 Lumbago with sciatica, unspecified side: Secondary | ICD-10-CM

## 2023-05-29 NOTE — Progress Notes (Signed)
E-Visit for Back Pain   We are sorry that you are not feeling well.  Here is how we plan to help!  Based on what you have shared with me it looks like you mostly have acute back pain.  Acute back pain is defined as musculoskeletal pain that can resolve in 1-3 weeks with conservative treatment.  Please contact your prescriber for refills of oxycodone.   Some patients experience stomach irritation or in increased heartburn with anti-inflammatory drugs.  Please keep in mind that muscle relaxer's can cause fatigue and should not be taken while at work or driving.  Back pain is very common.  The pain often gets better over time.  The cause of back pain is usually not dangerous.  Most people can learn to manage their back pain on their own.  Home Care Stay active.  Start with short walks on flat ground if you can.  Try to walk farther each day. Do not sit, drive or stand in one place for more than 30 minutes.  Do not stay in bed. Do not avoid exercise or work.  Activity can help your back heal faster. Be careful when you bend or lift an object.  Bend at your knees, keep the object close to you, and do not twist. Sleep on a firm mattress.  Lie on your side, and bend your knees.  If you lie on your back, put a pillow under your knees. Only take medicines as told by your doctor. Put ice on the injured area. Put ice in a plastic bag Place a towel between your skin and the bag Leave the ice on for 15-20 minutes, 3-4 times a day for the first 2-3 days. 210 After that, you can switch between ice and heat packs. Ask your doctor about back exercises or massage. Avoid feeling anxious or stressed.  Find good ways to deal with stress, such as exercise.  Get Help Right Way If: Your pain does not go away with rest or medicine. Your pain does not go away in 1 week. You have new problems. You do not feel well. The pain spreads into your legs. You cannot control when you poop (bowel movement) or pee  (urinate) You feel sick to your stomach (nauseous) or throw up (vomit) You have belly (abdominal) pain. You feel like you may pass out (faint). If you develop a fever.  Make Sure you: Understand these instructions. Will watch your condition Will get help right away if you are not doing well or get worse.  Your e-visit answers were reviewed by a board certified advanced clinical practitioner to complete your personal care plan.  Depending on the condition, your plan could have included both over the counter or prescription medications.  If there is a problem please reply  once you have received a response from your provider.  Your safety is important to [place].  If you have drug allergies check your prescription carefully.    You can use MyChart to ask questions about today's visit, request a non-urgent call back, or ask for a work or school excuse for 24 hours related to this e-Visit. If it has been greater than 24 hours you will need to follow up with your provider, or enter a new e-Visit to address those concerns.  You will get an e-mail in the next two days asking about your experience.  I hope that your e-visit has been valuable and will speed your recovery. Thank you for using e-visits.  I  have spent 5 minutes in review of e-visit questionnaire, review and updating patient chart, medical decision making and response to patient.   Tylene Fantasia Ward, PA-C

## 2023-05-30 MED ORDER — OXYCODONE HCL 5 MG PO TABS: 5.0000 mg | ORAL_TABLET | Freq: Four times a day (QID) | ORAL | 0 refills | Status: AC | PRN

## 2023-05-30 NOTE — Telephone Encounter (Signed)
Sent in 3 days worth for him to take 1 tablet every 6 hours if he needs it

## 2023-05-30 NOTE — Telephone Encounter (Signed)
Contacted pt . Pt states that he is unable to get this prescribed. Pt states that if [facility] prescribes the [facility]. Pt states he has been on medication for 20 years and has not abused in the past.

## 2023-05-30 NOTE — Telephone Encounter (Signed)
Patient called in to follow up on this. He stated that right now the medication is lost  somewhere with the mail. He is completely out of the medication and needing it. He is wanting to get enough to last until the shipment come in. He can be reached at [phone]. Thank you!

## 2023-05-30 NOTE — Telephone Encounter (Signed)
He has called office this morning and left message for them but has not heard anything from them yet. He has been out of medication for 3 days. Patient would like to know if our office can send in small amount to last until he can get in mail.

## 2023-05-30 NOTE — Telephone Encounter (Signed)
Can we call the patient and see if the prescriber that writes the pain medicine can do a short script of the oxycodone for him

## 2023-06-09 DIAGNOSIS — G4733 Obstructive sleep apnea (adult) (pediatric): Secondary | ICD-10-CM | POA: Diagnosis not present

## 2023-07-18 ENCOUNTER — Encounter: Payer: Self-pay | Admitting: Nurse Practitioner

## 2023-07-18 ENCOUNTER — Ambulatory Visit (INDEPENDENT_AMBULATORY_CARE_PROVIDER_SITE_OTHER): Payer: Medicare Other | Admitting: Nurse Practitioner

## 2023-07-18 VITALS — Temp 98.2°F | Ht 68.0 in | Wt 178.2 lb

## 2023-07-18 DIAGNOSIS — I10 Essential (primary) hypertension: Secondary | ICD-10-CM

## 2023-07-18 DIAGNOSIS — R42 Dizziness and giddiness: Secondary | ICD-10-CM

## 2023-07-18 DIAGNOSIS — R5383 Other fatigue: Secondary | ICD-10-CM | POA: Diagnosis not present

## 2023-07-18 LAB — CBC
HCT: 48.7 % (ref 39.0–52.0)
Hemoglobin: 15.8 g/dL (ref 13.0–17.0)
MCHC: 32.4 g/dL (ref 30.0–36.0)
MCV: 88.5 fL (ref 78.0–100.0)
Platelets: 161 10*3/uL (ref 150.0–400.0)
RBC: 5.5 Mil/uL (ref 4.22–5.81)
RDW: 15.6 % — ABNORMAL HIGH (ref 11.5–15.5)
WBC: 6.9 10*3/uL (ref 4.0–10.5)

## 2023-07-18 LAB — COMPREHENSIVE METABOLIC PANEL
ALT: 23 U/L (ref 0–53)
AST: 21 U/L (ref 0–37)
Albumin: 4 g/dL (ref 3.5–5.2)
Alkaline Phosphatase: 97 U/L (ref 39–117)
BUN: 8 mg/dL (ref 6–23)
CO2: 34 meq/L — ABNORMAL HIGH (ref 19–32)
Calcium: 9.2 mg/dL (ref 8.4–10.5)
Chloride: 100 meq/L (ref 96–112)
Creatinine, Ser: 0.95 mg/dL (ref 0.40–1.50)
GFR: 77.95 mL/min (ref >60.00)
Glucose, Bld: 140 mg/dL — ABNORMAL HIGH (ref 70–99)
Potassium: 3.8 meq/L (ref 3.5–5.1)
Sodium: 142 meq/L (ref 135–145)
Total Bilirubin: 0.9 mg/dL (ref 0.2–1.2)
Total Protein: 6.7 g/dL (ref 6.0–8.3)

## 2023-07-18 LAB — VITAMIN D 25 HYDROXY (VIT D DEFICIENCY, FRACTURES): VITD: 58.04 ng/mL (ref 30.00–100.00)

## 2023-07-18 LAB — VITAMIN B12: Vitamin B-12: 340 pg/mL (ref 211–911)

## 2023-07-18 LAB — TSH: TSH: 1.56 u[IU]/mL (ref 0.35–5.50)

## 2023-07-18 NOTE — Assessment & Plan Note (Signed)
Will check labs inclusive of CBC, CMP, TSH, B12, vitamin D.  Patient did do PHQ-9 and GAD-7 in office scores have increased since last office visit.  If metabolically okay consider antidepressant therapy

## 2023-07-18 NOTE — Patient Instructions (Addendum)
Nice to see you today I will be in touch with the labs after I have reviewed them. Follow up with me as needed  Drink plenty of fluid

## 2023-07-18 NOTE — Progress Notes (Signed)
Established Patient Office Visit  Subjective   Patient ID: James Pearson, male    DOB: Aug 08, 1946  Age: 77 y.o. MRN: [id number]  Chief Complaint  Patient presents with   hunger pains    Pt complains of stomach growling even when he has eaten food. Pt says this started a month ago. Pt also complains of getting lightheaded and dizzy. States he also has no energy.     HPI  Patient with history of hypertension, diabetes, CVA, HLD, hypogonadism, lightheadedness in the past. Patient is followed by neurology.  Last office it was 10/26/2022 Patient does take several antihypertensives including amlodipine, enalapril, hydrochlorothiazide.  Lightheadedness: states that the room is not spinning nut does not eel like he is going ot pass out either. States that it is a floating sensation. States that he will get it when he sits still and when he first gets up then it will abate. States sometimes worse with head movements.  States that it will happen on some days and on the days that it happens it can be worse than others. States that the last neruology visit was march.   Stomach noises state that it started approx 1 month ago. Statse that at first he did not pay it any attention. States that he will experience growling regardless of if he has been eating. States that the past two days it has not been happening. States that he has had some more gas as of late but not all the time (flatus). Bm daily      07/18/2023   12:28 PM 03/24/2023    1:50 PM 10/28/2022   12:20 PM  PHQ9 SCORE ONLY  PHQ-9 Total Score 16 0 4       07/18/2023   12:28 PM 10/28/2022   12:21 PM 08/26/2021    3:47 PM  GAD 7 : Generalized Anxiety Score  Nervous, Anxious, on Edge 1 1 2   Control/stop worrying 1 1 1   Worry too much - different things 2 1 3   Trouble relaxing 2 1 2   Restless 1 0 0  Easily annoyed or irritable 1 0 2  Afraid - awful might happen 1 0 3  Total GAD 7 Score 9 4 13   Anxiety Difficulty Somewhat difficult  Somewhat difficult        Review of Systems  Constitutional:  Positive for malaise/fatigue. Negative for chills, fever and weight loss.  Respiratory:  Negative for shortness of breath.   Cardiovascular:  Negative for chest pain.  Neurological:  Positive for dizziness. Negative for weakness and headaches.  Psychiatric/Behavioral:  Negative for hallucinations and suicidal ideas. The patient does not have insomnia.       Objective:     Temp 98.2 F (36.8 C) (Oral)   Ht 5\' 8"  (1.727 m)   Wt 178 lb 3.2 oz (80.8 kg)   SpO2 96%   BMI 27.10 kg/m  BP Readings from Last 3 Encounters:  05/23/23 132/80  01/27/23 (!) 133/100  01/19/23 (!) 146/70   Wt Readings from Last 3 Encounters:  07/18/23 178 lb 3.2 oz (80.8 kg)  05/23/23 174 lb (78.9 kg)  03/24/23 170 lb (77.1 kg)   SpO2 Readings from Last 3 Encounters:  07/18/23 96%  05/23/23 95%  01/27/23 95%      Physical Exam Vitals and nursing note reviewed.  Constitutional:      Appearance: Normal appearance.  HENT:     Right Ear: Tympanic membrane, ear canal and external ear normal.  Left Ear: Tympanic membrane, ear canal and external ear normal.     Mouth/Throat:     Mouth: Mucous membranes are moist.     Pharynx: Oropharynx is clear.  Eyes:     Extraocular Movements: Extraocular movements intact.     Pupils: Pupils are equal, round, and reactive to light.  Cardiovascular:     Rate and Rhythm: Normal rate and regular rhythm.     Heart sounds: Normal heart sounds.  Pulmonary:     Effort: Pulmonary effort is normal.     Breath sounds: Normal breath sounds.  Neurological:     General: No focal deficit present.     Mental Status: He is alert.     Deep Tendon Reflexes:     Reflex Scores:      Bicep reflexes are 1+ on the right side and 1+ on the left side.      Patellar reflexes are 1+ on the right side and 1+ on the left side.    Comments: Bilateral upper and lower extremity strength 5/5      No results found for  any visits on 07/18/23.    The ASCVD Risk score (Arnett DK, et al., 2019) failed to calculate for the following reasons:   The patient has a prior MI or stroke diagnosis    Assessment & Plan:   Problem List Items Addressed This Visit       Cardiovascular and Mediastinum   Essential hypertension - Primary    History of same.  Patient is on several antihypertensives including enalapril, hydrochlorothiazide, amlodipine.  Blood pressure decently controlled today in office continue medication as prescribed.  Encourage patient to do slow position changes and stay hydrated      Relevant Orders   CBC   Comprehensive metabolic panel   TSH     Other   Lightheadedness    Ambiguous in nature.  Borderline orthostatic in office.  Encouraged adequate hydration and slow position changes.  Will check basic blood work to make sure hemoglobin is within normal limits.  Patient admits to not drinking as much as of late      Relevant Orders   CBC   Comprehensive metabolic panel   Orthostatic vital signs   Fatigue    Will check labs inclusive of CBC, CMP, TSH, B12, vitamin D.  Patient did do PHQ-9 and GAD-7 in office scores have increased since last office visit.  If metabolically okay consider antidepressant therapy      Relevant Orders   CBC   Comprehensive metabolic panel   Vitamin B12   VITAMIN D 25 Hydroxy (Vit-D Deficiency, Fractures)   TSH    Return if symptoms worsen or fail to improve.    Audria Nine, NP

## 2023-07-18 NOTE — Assessment & Plan Note (Signed)
History of same.  Patient is on several antihypertensives including enalapril, hydrochlorothiazide, amlodipine.  Blood pressure decently controlled today in office continue medication as prescribed.  Encourage patient to do slow position changes and stay hydrated

## 2023-07-18 NOTE — Assessment & Plan Note (Signed)
Ambiguous in nature.  Borderline orthostatic in office.  Encouraged adequate hydration and slow position changes.  Will check basic blood work to make sure hemoglobin is within normal limits.  Patient admits to not drinking as much as of late

## 2023-07-22 ENCOUNTER — Telehealth: Payer: Self-pay | Admitting: Nurse Practitioner

## 2023-07-22 DIAGNOSIS — F320 Major depressive disorder, single episode, mild: Secondary | ICD-10-CM

## 2023-07-22 NOTE — Telephone Encounter (Signed)
-----   Message from [facility] T sent at 07/21/2023  4:28 PM EST ----- Called patient reviewed all information and repeated back to me.  Pt has declined taking any antidepressants but states that he is interested in taking something else for fatigue.

## 2023-07-22 NOTE — Telephone Encounter (Signed)
Think the fatigue is coming from the depression. Patient is already maintained on testosterone.  He is B12 and vitamin D were normal.  No other medical recommendations for pharmacological therapy for his fatigue

## 2023-07-25 MED ORDER — SERTRALINE HCL 50 MG PO TABS: ORAL_TABLET | ORAL | 0 refills | Status: DC

## 2023-07-25 NOTE — Addendum Note (Signed)
Addended by: Eden Emms on: 07/25/2023 01:46 PM   Modules accepted: Orders

## 2023-07-25 NOTE — Telephone Encounter (Signed)
We will start patient on Zoloft 25 mg nightly for 2 weeks then titrate up to 50 mg nightly thereafter.  Will have patient follow-up with me in 2 months prescription sent to pharmacy on file.

## 2023-07-25 NOTE — Telephone Encounter (Signed)
Called patient reviewed information. He is willing try anti depression but would like to know what one you would like to start him on.

## 2023-07-26 NOTE — Telephone Encounter (Signed)
Spoke to pt, told pt [facility]'s response regarding meds. Pt had no questions/concerns. Scheduled f/u for 09/28/23. Call back # [phone].

## 2023-07-26 NOTE — Telephone Encounter (Signed)
 Left detailed voicemail for patient to call the office back.

## 2023-08-06 ENCOUNTER — Other Ambulatory Visit: Payer: Self-pay | Admitting: Nurse Practitioner

## 2023-08-06 DIAGNOSIS — E7800 Pure hypercholesterolemia, unspecified: Secondary | ICD-10-CM

## 2023-08-08 ENCOUNTER — Other Ambulatory Visit: Payer: Self-pay | Admitting: Nurse Practitioner

## 2023-08-17 ENCOUNTER — Other Ambulatory Visit: Payer: Self-pay | Admitting: Nurse Practitioner

## 2023-08-17 DIAGNOSIS — F320 Major depressive disorder, single episode, mild: Secondary | ICD-10-CM

## 2023-09-14 ENCOUNTER — Ambulatory Visit: Payer: Self-pay | Admitting: Nurse Practitioner
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
## 2023-09-14 NOTE — Progress Notes (Signed)
 James Pearson T. Hinda Lindor, MD, CAQ Sports Medicine [facility] at [facility] [address] [place], [address]  Phone: [phone]  FAX: [phone]  James Pearson - 78 y.o. male  MRN [id number]  Date of Birth: Jun 14, 1946  Date: 09/15/2023  PCP: Wendee Lynwood HERO, NP  Referral: Wendee Lynwood HERO, NP  Chief Complaint  Patient presents with   Knee Pain    Right x 2 weeks   Subjective:   James Pearson is a 77 y.o. very pleasant male patient with Body mass index is 25.56 kg/m. who presents with the following:  He is a pleasant patient who I saw once previously with some rotator cuff arthropathy.  On chart review, he looks to have had a reverse shoulder arthroplasty done by Dr. Melita earlier in 2024 (01/27/2023).  Presents today with some ongoing knee pain.  He does have some underlying right-sided moderate knee osteoarthritis.  I pulled up the patient's x-rays again myself, and he does have some arthritic changes in the right knee.  Right now he is having difficulty standing up, and going up and down stairs and moving around quite a bit, predominantly from the right knee pain.  Also with complaints of L heel pain -he also has some mild nonfocal posterior heel pain on the left  Review of Systems is noted in the HPI, as appropriate  Objective:   BP 130/74 (BP Location: Right Arm, Patient Position: Sitting, Cuff Size: Normal)   Pulse 75   Temp 98.9 F (37.2 C) (Temporal)   Ht 5' 8 (1.727 m)   Wt 168 lb 2 oz (76.3 kg)   SpO2 97%   BMI 25.56 kg/m   GEN: No acute distress; alert,appropriate. PULM: Breathing comfortably in no respiratory distress PSYCH: Normally interactive.   Knee:  R Gait: Normal heel toe pattern, antalgic ROM: 0-112 Effusion: Mild to minimal Echymosis or edema: none Patellar tendon NT Painful PLICA: neg Patellar grind: Positive Medial and lateral patellar facet loading: Painful medial and lateral joint lines: Painful Mcmurray's  painful, no mechanical symptoms Flexion-pinch painful Varus and valgus stress: stable Lachman: neg Ant and Post drawer: neg Hip abduction, IR, ER: WNL Hip flexion str: 5/5 Hip abd: 5/5 Quad: 5/5 VMO atrophy:No Hamstring concentric and eccentric: 5/5   He still has marked restriction of motion on the left shoulder  Laboratory and Imaging Data:  Assessment and Plan:     ICD-10-CM   1. Primary osteoarthritis of right knee  M17.11 triamcinolone  acetonide (KENALOG -40) injection 40 mg    2. Chronic left shoulder pain  M25.512    G89.29      Acute on chronic right-sided knee osteoarthritis with exacerbation.  I am going to do a steroid injection to try to calm him down today.  Tried to counsel him about his chronic left shoulder pain and weakness.  He has had a bad surgical outcome after reverse shoulder arthroplasty.  Orthopedic notes are reviewed.  I do not think that there is a good or simple solution.  Aspiration/Injection Procedure Note James Pearson Feb 24, 1946 Date of procedure: 09/15/2023  Procedure: Large Joint Aspiration / Injection of Knee, R Indications: Pain  Procedure Details Patient verbally consented to procedure. Risks, benefits, and alternatives explained. Sterilely prepped with Chloraprep. Ethyl cholride used for anesthesia. 9 cc Lidocaine  1% mixed with 1 mL of Kenalog  40 mg injected using the anteromedial approach without difficulty. No complications with procedure and tolerated well. Patient had decreased pain post-injection. Medication: 1 mL of Kenalog  40  mg   Medication Management during today's office visit: Meds ordered this encounter  Medications   triamcinolone  acetonide (KENALOG -40) injection 40 mg   Medications Discontinued During This Encounter  Medication Reason   gabapentin (NEURONTIN) 100 MG capsule Side effect (s)    Orders placed today for conditions managed today: No orders of the defined types were placed in this  encounter.   Disposition: No follow-ups on file.  [employer] One speech-to-text software was used for transcription in this dictation.  Possible transcriptional errors can occur using Animal nutritionist.   Signed,  Jacques DASEN. Aerial Dilley, MD   Outpatient Encounter Medications as of 09/15/2023  Medication Sig   amLODipine  (NORVASC ) 5 MG tablet TAKE 1 TABLET BY MOUTH DAILY   aspirin  81 MG chewable tablet Chew 81 mg by mouth in the morning.   atorvastatin  (LIPITOR) 80 MG tablet TAKE 1 TABLET BY MOUTH ONCE  DAILY   BENFOTIAMINE PO Take 300 mg by mouth at bedtime.   CALCIUM -VITAMIN D  PO Take by mouth. Calcium  600 mg and vitamin d  400 mg   clobetasol  (TEMOVATE ) 0.05 % external solution Apply 1 application topically 2 (two) times daily. Apply to affected areas on scalp until itchy rash clears/ PRN   cyclobenzaprine  (FLEXERIL ) 10 MG tablet Take 1 tablet (10 mg total) by mouth 3 (three) times daily as needed for muscle spasms.   docusate sodium (COLACE) 100 MG capsule Take 100 mg by mouth daily.   enalapril  (VASOTEC ) 20 MG tablet TAKE 2 TABLETS BY MOUTH ONCE  DAILY   Fluocinolone  Acetonide Body 0.01 % OIL Apply to scalp, face and ears QD to BID   hydrochlorothiazide  (HYDRODIURIL ) 12.5 MG tablet TAKE 1 TABLET BY MOUTH DAILY   Iron , Ferrous Sulfate , 325 (65 Fe) MG TABS Take 325 mg by mouth daily.   ketoconazole  (NIZORAL ) 2 % shampoo APPLY THREE TIMES WEEKLY, LET SIT SEVERAL MINUTES BEFORE RINSING.   metFORMIN  (GLUCOPHAGE -XR) 500 MG 24 hr tablet Take 1 tablet (500 mg total) by mouth daily with breakfast.   Multiple Vitamins-Minerals (MULTIVITAMIN WITH MINERALS) tablet Take 1 tablet by mouth daily. Centrum silver 50+   ondansetron  (ZOFRAN ) 4 MG tablet Take 1 tablet (4 mg total) by mouth every 8 (eight) hours as needed for nausea or vomiting.   oxyCODONE  (OXY IR/ROXICODONE ) 5 MG immediate release tablet Take 5-10 mg by mouth every 6 (six) hours as needed.   sertraline  (ZOLOFT ) 50 MG tablet Take 1 tablet  (50 mg total) by mouth daily.   sildenafil (VIAGRA) 100 MG tablet Take 50-100 mg by mouth daily as needed for erectile dysfunction.   Specialty Vitamins Products (PROSTATE PO) Take 250 mg by mouth at bedtime. supra beta prostate   tamsulosin  (FLOMAX ) 0.4 MG CAPS capsule TAKE 2 CAPSULES BY MOUTH DAILY   testosterone cypionate (DEPOTESTOSTERONE CYPIONATE) 200 MG/ML injection Inject 1 mL into the muscle every 28 (twenty-eight) days.   tobramycin-dexamethasone  (TOBRADEX) ophthalmic solution Place 1 drop into the left eye daily as needed (eye infection).   traZODone  (DESYREL ) 50 MG tablet TAKE 1 TABLET BY MOUTH AT  BEDTIME AS NEEDED FOR SLEEP   TURMERIC PO Take 1 capsule by mouth daily as needed (Knee pain).   potassium chloride  SA (KLOR-CON  M) 20 MEQ tablet Take 2 tablets (40 mEq total) by mouth once for 1 dose.   [DISCONTINUED] gabapentin (NEURONTIN) 100 MG capsule Take by mouth. (Patient not taking: Reported on 07/18/2023)   [EXPIRED] triamcinolone  acetonide (KENALOG -40) injection 40 mg    No facility-administered encounter medications  on file as of 09/15/2023.

## 2023-09-15 ENCOUNTER — Encounter: Payer: Self-pay | Admitting: Family Medicine

## 2023-09-15 ENCOUNTER — Ambulatory Visit: Payer: Medicare Other | Admitting: Family Medicine

## 2023-09-15 VITALS — BP 130/74 | HR 75 | Temp 98.9°F | Ht 68.0 in | Wt 168.1 lb

## 2023-09-15 DIAGNOSIS — M25512 Pain in left shoulder: Secondary | ICD-10-CM

## 2023-09-15 DIAGNOSIS — G8929 Other chronic pain: Secondary | ICD-10-CM | POA: Diagnosis not present

## 2023-09-15 DIAGNOSIS — M1711 Unilateral primary osteoarthritis, right knee: Secondary | ICD-10-CM | POA: Diagnosis not present

## 2023-09-15 MED ORDER — TRIAMCINOLONE ACETONIDE 40 MG/ML IJ SUSP
40.0000 mg | Freq: Once | INTRAMUSCULAR | Status: AC
  Administered 2023-09-15: 40 mg via INTRA_ARTICULAR

## 2023-09-16 ENCOUNTER — Encounter: Payer: Self-pay | Admitting: Family Medicine

## 2023-09-28 ENCOUNTER — Ambulatory Visit: Payer: No Typology Code available for payment source | Admitting: Nurse Practitioner

## 2023-10-17 ENCOUNTER — Other Ambulatory Visit: Payer: Self-pay | Admitting: Nurse Practitioner

## 2023-10-26 ENCOUNTER — Ambulatory Visit (INDEPENDENT_AMBULATORY_CARE_PROVIDER_SITE_OTHER): Admitting: Physical Therapy

## 2023-10-26 ENCOUNTER — Encounter: Payer: Self-pay | Admitting: Physical Therapy

## 2023-10-26 DIAGNOSIS — M25612 Stiffness of left shoulder, not elsewhere classified: Secondary | ICD-10-CM | POA: Diagnosis not present

## 2023-10-26 DIAGNOSIS — M25512 Pain in left shoulder: Secondary | ICD-10-CM

## 2023-10-26 DIAGNOSIS — G8929 Other chronic pain: Secondary | ICD-10-CM

## 2023-10-26 DIAGNOSIS — M6281 Muscle weakness (generalized): Secondary | ICD-10-CM

## 2023-10-26 NOTE — Therapy (Cosign Needed)
 OUTPATIENT PHYSICAL THERAPY UPPER EXTREMITY EVALUATION   Patient Name: James Pearson MRN: [id number] DOB:17-Dec-1945, 78 y.o., male Today's Date: 10/26/2023  END OF SESSION:  PT End of Session - 10/26/23 1336     Visit Number 1    Number of Visits 15    Authorization Type [facility]    Authorization Time Period 09/21/2023-01/19/2024    Authorization - Visit Number 1    Authorization - Number of Visits 15    Progress Note Due on Visit 10    PT Start Time 1345    PT Stop Time 1428    PT Time Calculation (min) 43 min    Activity Tolerance Patient limited by pain    Behavior During Therapy WFL for tasks assessed/performed             Past Medical History:  Diagnosis Date   Arthritis    Diabetes mellitus without complication (HCC)    GERD (gastroesophageal reflux disease)    Hepatitis    Hypertension    Parkinson's disease (HCC)    Positive TB test    as a child   Stroke (HCC) 08/10/2011   Past Surgical History:  Procedure Laterality Date   CHOLECYSTECTOMY     HAND SURGERY     9 subsequent surgeries   REVERSE SHOULDER ARTHROPLASTY Left 01/27/2023   Procedure: REVERSE SHOULDER ARTHROPLASTY;  Surgeon: Francena Hanly, MD;  Location: [facility];  Service: Orthopedics;  Laterality: Left;    rotator cuff surgery Right    Patient Active Problem List   Diagnosis Date Noted   Fatigue 07/18/2023   Snoring 05/23/2023   Preoperative clearance 01/19/2023   Callus 10/28/2022   Isolated memory impairment 07/23/2022   Lightheadedness 07/07/2022   Medication refill 05/11/2022   Pain of left eye 05/11/2022   Red eye 05/11/2022   Lung nodule seen on imaging study 04/26/2022   Lung nodule 04/26/2022   Abnormal chest x-ray 04/26/2022   DOE (dyspnea on exertion) 04/22/2022   Acute non-recurrent maxillary sinusitis 04/08/2022   High risk medication use 03/22/2022   Elevated hemoglobin (HCC) 03/22/2022   Former smoker 01/28/2022   Unexplained weight loss  01/28/2022   Bilateral edema of lower extremity 10/09/2021   Pain, dental 10/09/2021   Preventative health care 10/09/2021   Tremor 09/18/2021   Left hip pain 09/18/2021   Anxiety and depression 08/26/2021   Chronic pain of right knee 08/26/2021   Grief 08/26/2021   Rotator cuff tear arthropathy 06/23/2021   Psychosexual dysfunction with inhibited sexual excitement 06/23/2021   Obesity 06/23/2021   Benign neoplasm of colon 06/23/2021   Carpal tunnel syndrome 06/23/2021   History of adenomatous polyp of colon 06/23/2021   Brachial neuritis 06/23/2021   Parkinson's disease (HCC) 06/23/2021   Dysuria 03/18/2021   Low testosterone in male 03/18/2021   Hypokalemia 01/06/2021   Pain in left shoulder 11/05/2020   Bilateral primary osteoarthritis of knee 12/19/2019   Unilateral primary osteoarthritis, right knee 10/30/2019   Insomnia 05/06/2015   Essential hypertension 05/06/2015   Chronic back pain 05/06/2015   Type 2 diabetes mellitus without complication (HCC) 05/06/2015   Stroke (HCC) 05/06/2015   HLD (hyperlipidemia) 05/06/2015   BPH (benign prostatic hyperplasia) 05/06/2015   Esophageal reflux 05/06/2015   History of TB (tuberculosis) 05/06/2015   Iron deficiency 04/10/2015    PCP: Eden Emms, NP   REFERRING PROVIDER: Wendi Maya, MD   REFERRING DIAG:  [phone] (ICD-10-CM) - Presence of right artificial shoulder  joint  L SHOULDER PAIN S/P L RTSA   THERAPY DIAG:  Chronic left shoulder pain  Stiffness of left shoulder, not elsewhere classified  Muscle weakness (generalized)  Rationale for Evaluation and Treatment: Rehabilitation  ONSET DATE: January 27, 2023  SUBJECTIVE:                                                                                                                                                                                      SUBJECTIVE STATEMENT: Since shoulder surgery patient as had pain constantly. Did go to PT for 4 visits after  his surgery however was delayed for an unknown reason with patient stating that his caregivers told him not to go. States that he was doing some exercises at home but was not sure of what to do so his shoulder became harder to move. Currently he has to use assistive devices when bathing and reaching overhead. States that he takes oxycodone for back pain however thinks that it might be making his shoulder pain worse. Previously, was walking 5-56mi a day but has not been doing that for a while. When walking uses a ball for support under the arm.  PERTINENT HISTORY: Chronic LBP, HTN, HLD, CVA (date unknown), DM2, Pagets disease of bone, action tremor  PAIN:  NPRS scale: 4/10 currently, 10/10 at worst Pain location: L shoulder Pain description: "tooth ache pain" Aggravating factors: reaching Relieving factors: ice, rest  PRECAUTIONS: None  WEIGHT BEARING RESTRICTIONS: No  FALLS:  Has patient fallen in last 6 months? No  LIVING ENVIRONMENT: Lives with: lives alone Lives in: House/apartment Stairs:  NA Has following equipment at home:  NA  OCCUPATION: Retired- [employer]  PLOF: Independent  PATIENT GOALS: play pickle ball again  Next MD visit: none scheduled  OBJECTIVE:   DIAGNOSTIC FINDINGS:  None on file from post surgery  PATIENT SURVEYS:  Patient-Specific Activity Scoring Scheme  "0" represents "unable to perform." "10" represents "able to perform at prior level. 0 1 2 3 4 5 6 7 8 9  10 (Date and Score)   Activity Eval     1. Lifting above head 2    2. Reaching arm in front of him  0    3. Close doors 4   4.    5.    Score 2/10    Total score = sum of the activity scores/number of activities Minimum detectable change (90%CI) for average score = 2 points Minimum detectable change (90%CI) for single activity score = 3 points  COGNITION: Overall cognitive status: WFL     SENSATION: Light touch: Impaired C3-C7 on Lt side with patient reporting 70%  sensation  compared to Rt side.    POSTURE: L shoulder rounded forward more than R, however both are rounded.   UPPER EXTREMITY ROM:   ROM Right eval Left eval  Shoulder flexion 120 P: 80 A: 10  Shoulder extension [facility]  Shoulder abduction 160 65  Shoulder adduction    Shoulder internal rotation Touches stomach Touches stomach  Shoulder external rotation 40 P: 4 A: 2  Elbow flexion [facility] WFL  Elbow extension [facility] WFL  Wrist flexion    Wrist extension    Wrist ulnar deviation    Wrist radial deviation    Wrist pronation    Wrist supination    (Blank rows = not tested)  UPPER EXTREMITY MMT:  MMT Right eval Left eval  Shoulder flexion 5 2  Shoulder extension  3  Shoulder abduction 4 3  Shoulder adduction    Shoulder internal rotation 5 3  Shoulder external rotation 5 3  Middle trapezius    Lower trapezius    Elbow flexion 4 4-  Elbow extension 4 4-  Wrist flexion    Wrist extension    Wrist ulnar deviation  3+  Wrist radial deviation  3+  Wrist pronation  4  Wrist supination  4  Grip strength (lbs)  Did not test with dynamometer;MMT 3+  (Blank rows = not tested)  SHOULDER SPECIAL TESTS: Not tested  JOINT MOBILITY TESTING:  Inferior glide- very stiff A-P and P-A: normal   PALPATION:  Non-tender throughout                                                                                                                                                                                                  TODAY'S TREATMENT:                                                                                                       DATE: 10/26/2023 Therex:    HEP instruction/performance c cues for techniques, handout provided.  Trial set performed of each for comprehension and symptom assessment.  See below for exercise list   PATIENT EDUCATION: Education details: HEP, POC Person educated: Patient Education method: Explanation, Demonstration, Verbal cues,  and  Handouts Education comprehension: verbalized understanding, returned demonstration, and verbal cues required  HOME EXERCISE PROGRAM:   ASSESSMENT:  CLINICAL IMPRESSION: Patient is a 78 y.o. who comes to clinic with complaints of Lt shoulder pain with mobility, strength and movement coordination deficits that impair their ability to perform usual daily and recreational functional activities without increase difficulty/symptoms at this time.  Patient to benefit from skilled PT services to address impairments and limitations to improve to previous level of function without restriction secondary to condition. Patient fearful of movement and will require activities and exercises that distract from movement of extremity.    OBJECTIVE IMPAIRMENTS: decreased activity tolerance, decreased knowledge of condition, decreased mobility, decreased ROM, decreased strength, hypomobility, increased edema, increased fascial restrictions, impaired perceived functional ability, increased muscle spasms, impaired sensation, impaired UE functional use, and pain.   ACTIVITY LIMITATIONS: carrying, lifting, bending, sitting, sleeping, bathing, dressing, reach over head, and hygiene/grooming  PARTICIPATION LIMITATIONS: meal prep, cleaning, laundry, driving, shopping, community activity, and yard work  PERSONAL FACTORS: Age, Past/current experiences, Time since onset of injury/illness/exacerbation, and 3+ comorbidities: see above  are also affecting patient's functional outcome.   REHAB POTENTIAL: Fair Shoulder has been stiff since June 2024 with patient also fearful of movement due to pain.  CLINICAL DECISION MAKING: Evolving/moderate complexity  EVALUATION COMPLEXITY: Moderate   GOALS: Goals reviewed with patient? Yes  SHORT TERM GOALS: (target date for Short term goals are 3 weeks 11/16/2023)  1.Patient will demonstrate independent use of home exercise program to maintain progress from in clinic treatments. Goal  status: New  LONG TERM GOALS: (target dates for all long term goals are 8 weeks  12/21/2023 )   1. Patient will demonstrate/report pain at worst less than or equal to 2/10 to facilitate minimal limitation in daily activity secondary to pain symptoms. Goal status: New   2. Patient will demonstrate independent use of home exercise program to facilitate ability to maintain/progress functional gains from skilled physical therapy services. Goal status: New   3. Patient will demonstrate Patient specific functional scale avg > or = 5 to indicate reduced disability due to condition.  Goal status: New   4.  Patient will demonstrate LUE MMT 5/5 throughout to facilitate lifting, reaching, carrying at [facility] in daily activity.   Goal status: New   5.  Patient will demonstrate [facility] joint AROM WFL s symptoms to facilitate usual overhead reaching, self care, dressing at PLOF.    Goal status: New   6.  Patient will demonstrate LUE grip strength </= 10lb different than RUE  Goal status: New    PLAN:  PT FREQUENCY: 1-2x/week  PT DURATION: 8 weeks  PLANNED INTERVENTIONS: Can include 82956- PT Re-evaluation, 97110-Therapeutic exercises, 97530- Therapeutic activity, 97112- Neuromuscular re-education, 97535- Self Care, 97140- Manual therapy, [phone]- Gait training, [phone]- Orthotic Fit/training, [phone]- Canalith repositioning, [id number]- Aquatic Therapy, [phone]- Electrical stimulation (unattended), [phone]- Electrical stimulation (manual), [id number] Physical performance testing, 97016- Vasopneumatic device, [id number]- Ultrasound, [id number]- Traction (mechanical), [id number]- Ionotophoresis 4mg /ml Dexamethasone, Patient/Family education, Balance training, Stair training, Taping, Dry Needling, Joint mobilization, Joint manipulation, Spinal manipulation, Spinal mobilization, Scar mobilization, Vestibular training, Visual/preceptual remediation/compensation, DME instructions, Cryotherapy, and Moist heat.  All performed as medically  necessary.  All included unless contraindicated  PLAN FOR NEXT SESSION: Review HEP knowledge/results. PROM geared towards ER. Incorporate tennis swinging and movements of LUE to decrease patient awareness of moving LUE as fear of movement is high.    Ebenezer Mccaskey, Hester Joslin, Student-PT 10/26/2023, 1:39 PM

## 2023-10-31 ENCOUNTER — Ambulatory Visit (INDEPENDENT_AMBULATORY_CARE_PROVIDER_SITE_OTHER): Payer: No Typology Code available for payment source | Admitting: Nurse Practitioner

## 2023-10-31 VITALS — BP 148/72 | HR 80 | Temp 98.2°F | Ht 68.0 in | Wt 174.8 lb

## 2023-10-31 DIAGNOSIS — Z7984 Long term (current) use of oral hypoglycemic drugs: Secondary | ICD-10-CM

## 2023-10-31 DIAGNOSIS — F419 Anxiety disorder, unspecified: Secondary | ICD-10-CM | POA: Diagnosis not present

## 2023-10-31 DIAGNOSIS — F32A Depression, unspecified: Secondary | ICD-10-CM

## 2023-10-31 DIAGNOSIS — E119 Type 2 diabetes mellitus without complications: Secondary | ICD-10-CM

## 2023-10-31 DIAGNOSIS — M25562 Pain in left knee: Secondary | ICD-10-CM

## 2023-10-31 LAB — POCT GLYCOSYLATED HEMOGLOBIN (HGB A1C): Hemoglobin A1C: 6.8 % — AB (ref 4.0–5.6)

## 2023-10-31 NOTE — Patient Instructions (Signed)
 Nice to see you today Use warm compresses to the back of the leg and Tylenol as needed Follow up with me in 6 months, sooner if you need me

## 2023-10-31 NOTE — Progress Notes (Signed)
 Established Patient Office Visit  Subjective   Patient ID: James Pearson, male    DOB: [birth year]  Age: 78 y.o. MRN: [id number]  Chief Complaint  Patient presents with   Medication Management    Pt states that he has started drinking mushroom coffee to help with sleep. Complains it is getting better.  Pt started taking two tablets of Setraline last Sunday. Pt complains medication makes him dizzy.     HPI  MDD/Lightheadedness: states that eh was seen by me and started to take zoloft starting 25mg  daily then upping to 50mg  daily. He had a period that he did not take the medication for approx 2.5 weeks. States that he has been back on the zoloft for approx 1 week. He is still having some lightheadedness. States that he has been using the mushroom coffee for the past several months and feel like it is helping  States that he is sleeping better. But he is having trouble getting to sleep. But he has fear of going to sleep because ehe does not want to wkae up ealry  States that he is not drinking as much as he use to.  Knot on leg: states that he has noticed it for the past 3 days. It is the left upper outer posterior area.  Patient has had varicose vein pair.  States that he normally puts his leg up on the footboard of the bed they will put socks on.  He is unsure if he injured it at that juncture.  They met many times and they are having trouble.  DM2: Patient currently maintained on metformin 500 mg daily.  A1c is 6.8% today.  Patient checks blood glucose infrequently with getting in the low 100s to the 1 teens.  Patient has any hypoglycemia.     10/31/2023   12:22 PM 07/18/2023   12:28 PM 03/24/2023    1:50 PM  PHQ9 SCORE ONLY  PHQ-9 Total Score 9 16 0        10/31/2023   12:24 PM 07/18/2023   12:28 PM 10/28/2022   12:21 PM 08/26/2021    3:47 PM  GAD 7 : Generalized Anxiety Score  Nervous, Anxious, on Edge 2 1 1 2   Control/stop worrying 1 1 1 1   Worry too much - different things 1  2 1 3   Trouble relaxing 1 2 1 2   Restless 0 1 0 0  Easily annoyed or irritable 1 1 0 2  Afraid - awful might happen 0 1 0 3  Total GAD 7 Score 6 9 4 13   Anxiety Difficulty  Somewhat difficult Somewhat difficult         Review of Systems  Constitutional:  Negative for chills and fever.  Respiratory:  Negative for shortness of breath.   Cardiovascular:  Negative for chest pain.  Neurological:  Positive for dizziness. Negative for headaches.  Psychiatric/Behavioral:  Negative for hallucinations and suicidal ideas.       Objective:     BP (!) 148/72   Pulse 80   Temp 98.2 F (36.8 C) (Oral)   Ht 5\' 8"  (1.727 m)   Wt 174 lb 12.8 oz (79.3 kg)   SpO2 98%   BMI 26.58 kg/m  BP Readings from Last 3 Encounters:  10/31/23 (!) 148/72  09/15/23 130/74  05/23/23 132/80   Wt Readings from Last 3 Encounters:  10/31/23 174 lb 12.8 oz (79.3 kg)  09/15/23 168 lb 2 oz (76.3 kg)  07/18/23 178 lb 3.2 oz (  80.8 kg)   SpO2 Readings from Last 3 Encounters:  10/31/23 98%  09/15/23 97%  07/18/23 96%      Physical Exam Vitals and nursing note reviewed.  Constitutional:      Appearance: Normal appearance.  Neck:     Vascular: No carotid bruit.  Cardiovascular:     Rate and Rhythm: Normal rate and regular rhythm.     Heart sounds: Normal heart sounds.  Pulmonary:     Effort: Pulmonary effort is normal.     Breath sounds: Normal breath sounds.  Neurological:     Mental Status: He is alert.      Results for orders placed or performed in visit on 10/31/23  POCT glycosylated hemoglobin (Hb A1C)  Result Value Ref Range   Hemoglobin A1C 6.8 (A) 4.0 - 5.6 %   HbA1c POC (<> result, manual entry)     HbA1c, POC (prediabetic range)     HbA1c, POC (controlled diabetic range)        The ASCVD Risk score (Arnett DK, et al., 2019) failed to calculate for the following reasons:   Risk score cannot be calculated because patient has a medical history suggesting prior/existing ASCVD     Assessment & Plan:   Problem List Items Addressed This Visit       Endocrine   Type 2 diabetes mellitus without complication (HCC)   Patient currently maintained on metformin 500 mg daily.  A1c 6.8%.  This is within goal.  Continue medications prescribed      Relevant Orders   POCT glycosylated hemoglobin (Hb A1C) (Completed)     Other   Anxiety and depression - Primary   Patient was started on sertraline 25 mg daily for can titrate up to 50 mg thereafter.  Patient had some adherence issues during her travel.  Has been back on medication for approximately 1 week.  PHQ-9 and GAD-7 administered in office.  Patient had improvement in both.  Patient denies any adverse drug events.  Patient denies HI/SI/AVH.  Continue medication as prescribed      Acute pain of left knee   Area of concern: Phlebitis.  No redness just area of induration.  Patient to do Tylenol and warm compresses.  If no improvement consider doing ultrasound as patient is on testosterone.  Do not think this is DVT if anything is SVT       Return in about 6 months (around 05/02/2024).    Audria Nine, NP

## 2023-10-31 NOTE — Assessment & Plan Note (Signed)
 Area of concern: Phlebitis.  No redness just area of induration.  Patient to do Tylenol and warm compresses.  If no improvement consider doing ultrasound as patient is on testosterone.  Do not think this is DVT if anything is SVT

## 2023-10-31 NOTE — Assessment & Plan Note (Signed)
 Patient was started on sertraline 25 mg daily for can titrate up to 50 mg thereafter.  Patient had some adherence issues during her travel.  Has been back on medication for approximately 1 week.  PHQ-9 and GAD-7 administered in office.  Patient had improvement in both.  Patient denies any adverse drug events.  Patient denies HI/SI/AVH.  Continue medication as prescribed

## 2023-10-31 NOTE — Assessment & Plan Note (Signed)
 Patient currently maintained on metformin 500 mg daily.  A1c 6.8%.  This is within goal.  Continue medications prescribed

## 2023-11-01 ENCOUNTER — Ambulatory Visit (INDEPENDENT_AMBULATORY_CARE_PROVIDER_SITE_OTHER): Payer: Self-pay | Admitting: Physical Therapy

## 2023-11-01 ENCOUNTER — Encounter: Payer: Self-pay | Admitting: Physical Therapy

## 2023-11-01 DIAGNOSIS — M25612 Stiffness of left shoulder, not elsewhere classified: Secondary | ICD-10-CM | POA: Diagnosis not present

## 2023-11-01 DIAGNOSIS — G8929 Other chronic pain: Secondary | ICD-10-CM

## 2023-11-01 DIAGNOSIS — M25512 Pain in left shoulder: Secondary | ICD-10-CM | POA: Diagnosis not present

## 2023-11-01 DIAGNOSIS — M6281 Muscle weakness (generalized): Secondary | ICD-10-CM

## 2023-11-01 NOTE — Therapy (Signed)
 OUTPATIENT PHYSICAL THERAPY UPPER EXTREMITYTREATMENT   Patient Name: James Pearson MRN: [id number] DOB:1946-01-04, 78 y.o., male Today's Date: 11/01/2023  END OF SESSION:  PT End of Session - 11/01/23 1517     Visit Number 2    Number of Visits 15    Authorization Type [facility]    Authorization Time Period 09/21/2023-01/19/2024    Authorization - Visit Number 2    Authorization - Number of Visits 15    Progress Note Due on Visit 10    PT Start Time 1515    PT Stop Time 1555    PT Time Calculation (min) 40 min    Activity Tolerance Patient limited by pain    Behavior During Therapy [facility] for tasks assessed/performed             Past Medical History:  Diagnosis Date   Arthritis    Diabetes mellitus without complication (HCC)    GERD (gastroesophageal reflux disease)    Hepatitis    Hypertension    Parkinson's disease (HCC)    Positive TB test    as a child   Stroke (HCC) 08/10/2011   Past Surgical History:  Procedure Laterality Date   CHOLECYSTECTOMY     HAND SURGERY     9 subsequent surgeries   REVERSE SHOULDER ARTHROPLASTY Left 01/27/2023   Procedure: REVERSE SHOULDER ARTHROPLASTY;  Surgeon: Francena Hanly, MD;  Location: [facility];  Service: Orthopedics;  Laterality: Left;    rotator cuff surgery Right    Patient Active Problem List   Diagnosis Date Noted   Fatigue 07/18/2023   Snoring 05/23/2023   Preoperative clearance 01/19/2023   Callus 10/28/2022   Isolated memory impairment 07/23/2022   Lightheadedness 07/07/2022   Medication refill 05/11/2022   Pain of left eye 05/11/2022   Red eye 05/11/2022   Lung nodule seen on imaging study 04/26/2022   Lung nodule 04/26/2022   Abnormal chest x-ray 04/26/2022   DOE (dyspnea on exertion) 04/22/2022   Acute non-recurrent maxillary sinusitis 04/08/2022   High risk medication use 03/22/2022   Elevated hemoglobin (HCC) 03/22/2022   Former smoker 01/28/2022   Unexplained weight loss  01/28/2022   Bilateral edema of lower extremity 10/09/2021   Pain, dental 10/09/2021   Preventative health care 10/09/2021   Tremor 09/18/2021   Left hip pain 09/18/2021   Anxiety and depression 08/26/2021   Acute pain of left knee 08/26/2021   Grief 08/26/2021   Rotator cuff tear arthropathy 06/23/2021   Psychosexual dysfunction with inhibited sexual excitement 06/23/2021   Obesity 06/23/2021   Benign neoplasm of colon 06/23/2021   Carpal tunnel syndrome 06/23/2021   History of adenomatous polyp of colon 06/23/2021   Brachial neuritis 06/23/2021   Parkinson's disease (HCC) 06/23/2021   Dysuria 03/18/2021   Low testosterone in male 03/18/2021   Hypokalemia 01/06/2021   Pain in left shoulder 11/05/2020   Bilateral primary osteoarthritis of knee 12/19/2019   Unilateral primary osteoarthritis, right knee 10/30/2019   Insomnia 05/06/2015   Essential hypertension 05/06/2015   Chronic back pain 05/06/2015   Type 2 diabetes mellitus without complication (HCC) 05/06/2015   Stroke (HCC) 05/06/2015   HLD (hyperlipidemia) 05/06/2015   BPH (benign prostatic hyperplasia) 05/06/2015   Esophageal reflux 05/06/2015   History of TB (tuberculosis) 05/06/2015   Iron deficiency 04/10/2015    PCP: Eden Emms, NP   REFERRING PROVIDER: Wendi Maya, MD   REFERRING DIAG:  [phone] (ICD-10-CM) - Presence of right artificial shoulder joint  L SHOULDER PAIN S/P L RTSA   THERAPY DIAG:  Chronic left shoulder pain  Stiffness of left shoulder, not elsewhere classified  Muscle weakness (generalized)  Left shoulder pain, unspecified chronicity  Rationale for Evaluation and Treatment: Rehabilitation  ONSET DATE: January 27, 2023 S/P rTSA  SUBJECTIVE:                                                                                                                                                                                      SUBJECTIVE STATEMENT: Relays is bringing the pain with  his shoulder upon arrival today  PERTINENT HISTORY: S/P rTSA,Chronic LBP, HTN, HLD, CVA (date unknown), DM2, Pagets disease of bone, action tremor  PAIN:  NPRS scale: 5/10 currently Pain location: L shoulder Pain description: "tooth ache pain" Aggravating factors: reaching Relieving factors: ice, rest  PRECAUTIONS: None  WEIGHT BEARING RESTRICTIONS: No  FALLS:  Has patient fallen in last 6 months? No  LIVING ENVIRONMENT: Lives with: lives alone Lives in: House/apartment Stairs:  NA Has following equipment at home:  NA  OCCUPATION: Retired- [employer]  PLOF: Independent  PATIENT GOALS: play pickle ball again  Next MD visit: none scheduled  OBJECTIVE:   DIAGNOSTIC FINDINGS:  None on file from post surgery  PATIENT SURVEYS:  Patient-Specific Activity Scoring Scheme  "0" represents "unable to perform." "10" represents "able to perform at prior level. 0 1 2 3 4 5 6 7 8 9  10 (Date and Score)   Activity Eval     1. Lifting above head 2    2. Reaching arm in front of him  0    3. Close doors 4   4.    5.    Score 6/30    Total score = sum of the activity scores/number of activities Minimum detectable change (90%CI) for average score = 2 points Minimum detectable change (90%CI) for single activity score = 3 points  COGNITION: Overall cognitive status: WFL     SENSATION: Light touch: Impaired C3-C7 on Lt side with patient reporting 70% sensation compared to Rt side.    POSTURE: L shoulder rounded forward more than R, however both are rounded.   UPPER EXTREMITY ROM:   ROM Right eval Left eval  Shoulder flexion 120 P: 90 A: 10  Shoulder extension [facility]  Shoulder abduction 160 65  Shoulder adduction    Shoulder internal rotation Touches stomach Touches stomach  Shoulder external rotation 40 P: 4 A: 2  Elbow flexion [facility] WFL  Elbow extension [facility] WFL  Wrist flexion    Wrist extension    Wrist ulnar deviation    Wrist radial  deviation  Wrist pronation    Wrist supination    (Blank rows = not tested)  UPPER EXTREMITY MMT:  MMT Right eval Left eval  Shoulder flexion 5 2  Shoulder extension  3  Shoulder abduction 4 2  Shoulder adduction    Shoulder internal rotation 5 3  Shoulder external rotation 5 3  Middle trapezius    Lower trapezius    Elbow flexion 4 4-  Elbow extension 4 4-  Wrist flexion    Wrist extension    Wrist ulnar deviation  3+  Wrist radial deviation  3+  Wrist pronation  4  Wrist supination  4  Grip strength (lbs)  Did not test with dynamometer;MMT 3+  (Blank rows = not tested)  SHOULDER SPECIAL TESTS: Not tested  JOINT MOBILITY TESTING:  Inferior glide- very stiff and end ROM A-P and P-A: normal at mid ROM  PALPATION:  Non-tender throughout                                                                                                                                                                                                  TODAY'S TREATMENT:                                                                                                        DATE: 11/02/2023 Theractivity :(ROM and or strength for reaching) Pendulums X 20 CW,CCW, front/back, sideways Tennis racket swings to simulate pickleball, 20 swings fronthand and 20 swings backhand Pulleys 2 min flexion Pulleys 2 min abduction P ball roll up wall X 5, this was difficult for him to modified to rolling across high table X 10 flexion, X 10 abduction holding 5 sec Seated table ER stretch 5 sec X 10 Supine wand flexion 2# bar 5 sec hold X 10 Supine wand chest press 2# bar 2X10  Manual: Left shoulder PROM with overpressure within pain tolerance flexion, abd, ER planes, GH mobs grade 1-3 for inferior glides, and distraction glides.   DATE: 10/26/2023 Therex:    HEP instruction/performance c cues for techniques, handout provided.  Trial set performed of each for comprehension and symptom assessment.  See below  for exercise  list  SelfCare: education on importance of consistent stretching and exercise program for his shoulder 3-5 times per day to improve ROM and function of shoulder. Education to push to the edge of pain but not push through the pain so that he can stretch his shoulder but not irritate it.   PATIENT EDUCATION: Education details: HEP, POC, pain and how hard to push with his exercises Person educated: Patient Education method: Explanation, Demonstration, Verbal cues, and Handouts Education comprehension: verbalized understanding, returned demonstration, and verbal cues required  HOME EXERCISE PROGRAM: Access Code: [email or web address] URL: [email or web address] Date: 10/27/2023 Prepared by: Ivery Quale  Exercises - Supine Shoulder Flexion Extension AAROM with Dowel  - 3-5 x daily - 7 x weekly - 1 sets - 10 reps - 5 sec hold - Supine Shoulder External Rotation in 45 Degrees Abduction AAROM with Dowel  - 3-5 x daily - 7 x weekly - 1 sets - 10 reps - 5 sec hold - Circular Shoulder Pendulum with Table Support  - 3-5 x daily - 7 x weekly - 1 sets - 15 reps - Seated Shoulder Abduction Towel Slide at Table Top  - 3-5 x daily - 6 x weekly - 1 sets - 10 reps - 5 sec hold - Seated Shoulder External Rotation PROM on Table  - 3-5 x daily - 6 x weekly - 1 sets - 10 reps - 5 hold  ASSESSMENT:  CLINICAL IMPRESSION: He has been doing some of the exercises and does report he is already seeing some progress. He was observed to have more ROM noted today however is still very limited with this and will continue to benefit from PT to improve this.   OBJECTIVE IMPAIRMENTS: decreased activity tolerance, decreased knowledge of condition, decreased mobility, decreased ROM, decreased strength, hypomobility, increased edema, increased fascial restrictions, impaired perceived functional ability, increased muscle spasms, impaired sensation, impaired UE functional use, and pain.   ACTIVITY LIMITATIONS:  carrying, lifting, bending, sitting, sleeping, bathing, dressing, reach over head, and hygiene/grooming  PARTICIPATION LIMITATIONS: meal prep, cleaning, laundry, driving, shopping, community activity, and yard work  PERSONAL FACTORS: Age, Past/current experiences, Time since onset of injury/illness/exacerbation, and 3+ comorbidities: see above  are also affecting patient's functional outcome.   REHAB POTENTIAL: Fair Shoulder has been stiff since June 2024 with patient also fearful of movement due to pain.  CLINICAL DECISION MAKING: Evolving/moderate complexity  EVALUATION COMPLEXITY: Moderate   GOALS: Goals reviewed with patient? Yes  SHORT TERM GOALS: (target date for Short term goals are 3 weeks 11/16/2023)  1.Patient will demonstrate independent use of home exercise program to maintain progress from in clinic treatments. Goal status: New  LONG TERM GOALS: (target dates for all long term goals are 8 weeks  12/21/2023 )   1. Patient will demonstrate/report pain at worst less than or equal to 2/10  at rest and less than 5/10 with reaching  Goal status: New   2. Patient will demonstrate independent use of home exercise program to facilitate ability to maintain/progress functional gains from skilled physical therapy services. Goal status: New   3. Patient will demonstrate Patient specific functional scale avg > or = 15/30 to indicate reduced disability due to condition.  Goal status: New   4.  Patient will demonstrate LUE MMT 4/5 throughout to facilitate lifting, reaching, carrying at [facility] in daily activity.   Goal status: New   5.  Patient will demonstrate Left GH joint AROM WFL s symptoms to facilitate usual overhead reaching, self care, dressing at  PLOF.    Goal status: New   6.  Patient will demonstrate LUE grip strength </= 10lb different than RUE  Goal status: New    PLAN:  PT FREQUENCY: 1-3x/week  PT DURATION: 8 weeks  PLANNED INTERVENTIONS: Can include 91478- PT  Re-evaluation, 97110-Therapeutic exercises, 97530- Therapeutic activity, 97112- Neuromuscular re-education, 97535- Self Care, 97140- Manual therapy, [phone]- Gait training, [phone]- Orthotic Fit/training, [phone]- Canalith repositioning, [id number]- Aquatic Therapy, [phone]- Electrical stimulation (unattended), [phone]- Electrical stimulation (manual), [id number] Physical performance testing, 97016- Vasopneumatic device, [id number]- Ultrasound, [id number]- Traction (mechanical), [id number]- Ionotophoresis 4mg /ml Dexamethasone, Patient/Family education, Balance training, Stair training, Taping, Dry Needling, Joint mobilization, Joint manipulation, Spinal manipulation, Spinal mobilization, Scar mobilization, Vestibular training, Visual/preceptual remediation/compensation, DME instructions, Cryotherapy, and Moist heat.  All performed as medically necessary.  All included unless contraindicated  PLAN FOR NEXT SESSION: PROM geared towards ER, flexion, abd. Incorporate tennis swinging and movements of LUE to decrease patient awareness of moving LUE as fear of movement is high.   Ivery Quale, PT, DPT 11/01/23 3:18 PM

## 2023-11-03 ENCOUNTER — Encounter: Payer: Self-pay | Admitting: Physical Therapy

## 2023-11-07 ENCOUNTER — Telehealth: Payer: Self-pay | Admitting: Nurse Practitioner

## 2023-11-07 NOTE — Telephone Encounter (Signed)
-----   Message from [facility] sent at 10/31/2023  1:12 PM EDT ----- Regarding: Left knee See if patient is likely has improved with warm compresses and Tylenol.  If not really getting worse consider ultrasound to rule out SVT.

## 2023-11-07 NOTE — Telephone Encounter (Signed)
 Can we call the patient and see how his left knee is doing. Did he do the warm compresses and tylenol.  Has it improved?

## 2023-11-07 NOTE — Telephone Encounter (Signed)
 Contacted pt.  Pt complains that his left knee is getting better and he has been using warm compresses but no tylenol. States that he was told not to use that medication and says he will only take it if he really needs to.   Pt also complains of forgetting to mention the ongoing heel and foot pain that is now progressed to both feet.  Pt is wondering if symptoms are due to neuropathy. Advised pt that I can set up an appointment to evaluate further,  Pt declined and stated well matt knows about the complaint.

## 2023-11-08 ENCOUNTER — Ambulatory Visit (INDEPENDENT_AMBULATORY_CARE_PROVIDER_SITE_OTHER): Admitting: Physical Therapy

## 2023-11-08 ENCOUNTER — Encounter: Payer: Self-pay | Admitting: Physical Therapy

## 2023-11-08 DIAGNOSIS — M25512 Pain in left shoulder: Secondary | ICD-10-CM | POA: Diagnosis not present

## 2023-11-08 DIAGNOSIS — M25612 Stiffness of left shoulder, not elsewhere classified: Secondary | ICD-10-CM

## 2023-11-08 DIAGNOSIS — M6281 Muscle weakness (generalized): Secondary | ICD-10-CM

## 2023-11-08 DIAGNOSIS — G8929 Other chronic pain: Secondary | ICD-10-CM | POA: Diagnosis not present

## 2023-11-08 NOTE — Therapy (Signed)
 OUTPATIENT PHYSICAL THERAPY UPPER EXTREMITYTREATMENT   Patient Name: James Pearson MRN: [id number] DOB:1946-03-10, 78 y.o., male Today's Date: 11/08/2023  END OF SESSION:  PT End of Session - 11/08/23 1259     Visit Number 3    Number of Visits 15    Authorization Type [facility]    Authorization Time Period 09/21/2023-01/19/2024    Authorization - Visit Number 3    Authorization - Number of Visits 15    Progress Note Due on Visit 10    PT Start Time 1259    PT Stop Time 1344    PT Time Calculation (min) 45 min    Activity Tolerance Patient tolerated treatment well    Behavior During Therapy WFL for tasks assessed/performed              Past Medical History:  Diagnosis Date   Arthritis    Diabetes mellitus without complication (HCC)    GERD (gastroesophageal reflux disease)    Hepatitis    Hypertension    Parkinson's disease (HCC)    Positive TB test    as a child   Stroke (HCC) 08/10/2011   Past Surgical History:  Procedure Laterality Date   CHOLECYSTECTOMY     HAND SURGERY     9 subsequent surgeries   REVERSE SHOULDER ARTHROPLASTY Left 01/27/2023   Procedure: REVERSE SHOULDER ARTHROPLASTY;  Surgeon: Francena Hanly, MD;  Location: [facility];  Service: Orthopedics;  Laterality: Left;    rotator cuff surgery Right    Patient Active Problem List   Diagnosis Date Noted   Fatigue 07/18/2023   Snoring 05/23/2023   Preoperative clearance 01/19/2023   Callus 10/28/2022   Isolated memory impairment 07/23/2022   Lightheadedness 07/07/2022   Medication refill 05/11/2022   Pain of left eye 05/11/2022   Red eye 05/11/2022   Lung nodule seen on imaging study 04/26/2022   Lung nodule 04/26/2022   Abnormal chest x-ray 04/26/2022   DOE (dyspnea on exertion) 04/22/2022   Acute non-recurrent maxillary sinusitis 04/08/2022   High risk medication use 03/22/2022   Elevated hemoglobin (HCC) 03/22/2022   Former smoker 01/28/2022   Unexplained weight loss  01/28/2022   Bilateral edema of lower extremity 10/09/2021   Pain, dental 10/09/2021   Preventative health care 10/09/2021   Tremor 09/18/2021   Left hip pain 09/18/2021   Anxiety and depression 08/26/2021   Acute pain of left knee 08/26/2021   Grief 08/26/2021   Rotator cuff tear arthropathy 06/23/2021   Psychosexual dysfunction with inhibited sexual excitement 06/23/2021   Obesity 06/23/2021   Benign neoplasm of colon 06/23/2021   Carpal tunnel syndrome 06/23/2021   History of adenomatous polyp of colon 06/23/2021   Brachial neuritis 06/23/2021   Parkinson's disease (HCC) 06/23/2021   Dysuria 03/18/2021   Low testosterone in male 03/18/2021   Hypokalemia 01/06/2021   Pain in left shoulder 11/05/2020   Bilateral primary osteoarthritis of knee 12/19/2019   Unilateral primary osteoarthritis, right knee 10/30/2019   Insomnia 05/06/2015   Essential hypertension 05/06/2015   Chronic back pain 05/06/2015   Type 2 diabetes mellitus without complication (HCC) 05/06/2015   Stroke (HCC) 05/06/2015   HLD (hyperlipidemia) 05/06/2015   BPH (benign prostatic hyperplasia) 05/06/2015   Esophageal reflux 05/06/2015   History of TB (tuberculosis) 05/06/2015   Iron deficiency 04/10/2015    PCP: Eden Emms, NP  REFERRING PROVIDER: Eden Emms, NP   REFERRING DIAG:  [id number] (ICD-10-CM) - Presence of right artificial shoulder joint  L SHOULDER PAIN S/P L RTSA   THERAPY DIAG:  Chronic left shoulder pain  Stiffness of left shoulder, not elsewhere classified  Muscle weakness (generalized)  Left shoulder pain, unspecified chronicity  Rationale for Evaluation and Treatment: Rehabilitation  ONSET DATE: January 27, 2023 S/P rTSA  SUBJECTIVE:                                                                                                                                                                                      SUBJECTIVE STATEMENT: He does his exercises.  It looses up but  then tightens back up.  Overall the shoulder is not as tight as prior to restarting PT.   PERTINENT HISTORY: S/P rTSA 01/27/23, Chronic LBP, HTN, HLD, CVA (date unknown), DM2, Pagets disease of bone, action tremor  PAIN:  NPRS scale:  4/10 currently and since last PT 2/10 lowest & 8/10  Pain location: L shoulder Pain description: "tooth ache pain" Aggravating factors: reaching Relieving factors: ice, rest  PRECAUTIONS: None  WEIGHT BEARING RESTRICTIONS: No  FALLS:  Has patient fallen in last 6 months? No  LIVING ENVIRONMENT: Lives with: lives alone Lives in: House/apartment Stairs:  NA Has following equipment at home:  NA  OCCUPATION: Retired- [employer]  PLOF: Independent  PATIENT GOALS: play pickle ball again  Next MD visit: none scheduled  OBJECTIVE:   DIAGNOSTIC FINDINGS:  None on file from post surgery  PATIENT SURVEYS:  Patient-Specific Activity Scoring Scheme  "0" represents "unable to perform." "10" represents "able to perform at prior level. 0 1 2 3 4 5 6 7 8 9  10 (Date and Score)   Activity Eval     1. Lifting above head 2    2. Reaching arm in front of him  0    3. Close doors 4   4.    5.    Score 6/30    Total score = sum of the activity scores/number of activities Minimum detectable change (90%CI) for average score = 2 points Minimum detectable change (90%CI) for single activity score = 3 points  COGNITION: Overall cognitive status: WFL     SENSATION: Light touch: Impaired C3-C7 on Lt side with patient reporting 70% sensation compared to Rt side.    POSTURE: L shoulder rounded forward more than R, however both are rounded.   UPPER EXTREMITY ROM:   ROM Right eval Left eval  Shoulder flexion 120 P: 90 A: 10  Shoulder extension [facility]  Shoulder abduction 160 65  Shoulder adduction    Shoulder internal rotation Touches stomach Touches stomach  Shoulder external rotation 40 P: 4 A: 2  Elbow flexion  WFL WFL  Elbow  extension [facility] WFL  Wrist flexion    Wrist extension    Wrist ulnar deviation    Wrist radial deviation    Wrist pronation    Wrist supination    (Blank rows = not tested)  UPPER EXTREMITY MMT:  MMT Right eval Left eval  Shoulder flexion 5 2  Shoulder extension  3  Shoulder abduction 4 2  Shoulder adduction    Shoulder internal rotation 5 3  Shoulder external rotation 5 3  Middle trapezius    Lower trapezius    Elbow flexion 4 4-  Elbow extension 4 4-  Wrist flexion    Wrist extension    Wrist ulnar deviation  3+  Wrist radial deviation  3+  Wrist pronation  4  Wrist supination  4  Grip strength (lbs)  Did not test with dynamometer;MMT 3+  (Blank rows = not tested)  SHOULDER SPECIAL TESTS: Not tested  JOINT MOBILITY TESTING:  Inferior glide- very stiff and end ROM A-P and P-A: normal at mid ROM  PALPATION:  Non-tender throughout                                                                                                                                                                                                  TODAY'S TREATMENT:                                                                                                        DATE:  11/08/2023 Therapeutic Exercise: Pendulums X 20 CW, CCW, flexion/ext & abd/add.  PT cued on position to increase range and start / end small progressing to max motion.  Pt return demo understanding.   Pulleys 2 min flexion.  PT cueing on hand position and following left hand with eyes to facilitate proper coordinated trunk motions.  Pulleys 2 min scaption / abduction PT cueing on hand position and following left hand with eyes to facilitate proper coordinated trunk motions.  Supine wand flexion 1# bar 5 sec hold X 10 reps 2 sets.  Switched to elbow bent 90* for shorter lever arm.   Supine wand chest press 1# bar  2X10 Supine bilateral scapular retraction 5-second hold 10 reps Supine scapular depression reaching left upper  extremity downward 5-second hold 10 reps.  PT tactile cues at shoulders to prevent anterior rolling.   Therapeutic activities: Patient use tennis racquet choked up to simulate pickleball racquet. He was able to toss badminton birdies with left upper extremity to hit with the racquet in his dominant right upper extremity.  PT cued that he was using more elbow flexion than shoulder motion to toss the badminton Birdie. Worked on tossing the badminton birdie using shoulder motion.  Progressed to tossing the birdie over chair back to a high target on the wall to facilitate shoulder flexion motion.  Patient improved with PT education.  Manual therapy: Contract relax for range for left shoulder rotation internal & external, shoulder adduction, abduction and flexion. Soft tissue mobilizations to axilla and pectoralis.     TREATMENT:                                                                                                        DATE: 11/02/2023 Theractivity :(ROM and or strength for reaching) Pendulums X 20 CW,CCW, front/back, sideways Tennis racket swings to simulate pickleball, 20 swings fronthand and 20 swings backhand Pulleys 2 min flexion Pulleys 2 min abduction P ball roll up wall X 5, this was difficult for him to modified to rolling across high table X 10 flexion, X 10 abduction holding 5 sec Seated table ER stretch 5 sec X 10 Supine wand flexion 2# bar 5 sec hold X 10 Supine wand chest press 2# bar 2X10  Manual: Left shoulder PROM with overpressure within pain tolerance flexion, abd, ER planes, GH mobs grade 1-3 for inferior glides, and distraction glides.    DATE:  10/26/2023 Therex:    HEP instruction/performance c cues for techniques, handout provided.  Trial set performed of each for comprehension and symptom assessment.  See below for exercise list  SelfCare: education on importance of consistent stretching and exercise program for his shoulder 3-5 times per day to  improve ROM and function of shoulder. Education to push to the edge of pain but not push through the pain so that he can stretch his shoulder but not irritate it.   PATIENT EDUCATION: Education details: HEP, POC, pain and how hard to push with his exercises Person educated: Patient Education method: Explanation, Demonstration, Verbal cues, and Handouts Education comprehension: verbalized understanding, returned demonstration, and verbal cues required  HOME EXERCISE PROGRAM: Access Code: [email or web address] URL: [email or web address] Date: 10/27/2023 Prepared by: Ivery Quale  Exercises - Supine Shoulder Flexion Extension AAROM with Dowel  - 3-5 x daily - 7 x weekly - 1 sets - 10 reps - 5 sec hold - Supine Shoulder External Rotation in 45 Degrees Abduction AAROM with Dowel  - 3-5 x daily - 7 x weekly - 1 sets - 10 reps - 5 sec hold - Circular Shoulder Pendulum with Table Support  - 3-5 x daily - 7 x weekly - 1 sets - 15 reps - Seated Shoulder Abduction Towel Slide at Table Top  -  3-5 x daily - 6 x weekly - 1 sets - 10 reps - 5 sec hold - Seated Shoulder External Rotation PROM on Table  - 3-5 x daily - 6 x weekly - 1 sets - 10 reps - 5 hold  ASSESSMENT:  CLINICAL IMPRESSION: Patient improved shoulder range and motion of control with PT activities.  Patient improved left shoulder motion for pickleball type activities that is his goal with PT instruction.  Patient reports he can tell an improvement in his range with the activities he is already been performing with PT.  He will continue to benefit from PT to improve this.   OBJECTIVE IMPAIRMENTS: decreased activity tolerance, decreased knowledge of condition, decreased mobility, decreased ROM, decreased strength, hypomobility, increased edema, increased fascial restrictions, impaired perceived functional ability, increased muscle spasms, impaired sensation, impaired UE functional use, and pain.   ACTIVITY LIMITATIONS: carrying, lifting,  bending, sitting, sleeping, bathing, dressing, reach over head, and hygiene/grooming  PARTICIPATION LIMITATIONS: meal prep, cleaning, laundry, driving, shopping, community activity, and yard work  PERSONAL FACTORS: Age, Past/current experiences, Time since onset of injury/illness/exacerbation, and 3+ comorbidities: see above  are also affecting patient's functional outcome.   REHAB POTENTIAL: Fair Shoulder has been stiff since June 2024 with patient also fearful of movement due to pain.  CLINICAL DECISION MAKING: Evolving/moderate complexity  EVALUATION COMPLEXITY: Moderate   GOALS: Goals reviewed with patient? Yes  SHORT TERM GOALS: (target date for Short term goals are 3 weeks 11/16/2023)  1.Patient will demonstrate independent use of home exercise program to maintain progress from in clinic treatments. Goal status: Ongoing  11/08/2023  LONG TERM GOALS: (target dates for all long term goals are 8 weeks  12/21/2023 )   1. Patient will demonstrate/report pain at worst less than or equal to 2/10  at rest and less than 5/10 with reaching  Goal status: Ongoing  11/08/2023   2. Patient will demonstrate independent use of home exercise program to facilitate ability to maintain/progress functional gains from skilled physical therapy services. Goal status: Ongoing  11/08/2023   3. Patient will demonstrate Patient specific functional scale avg > or = 15/30 to indicate reduced disability due to condition.  Goal status: Ongoing  11/08/2023   4.  Patient will demonstrate LUE MMT 4/5 throughout to facilitate lifting, reaching, carrying at [facility] in daily activity.   Goal status: Ongoing  11/08/2023   5.  Patient will demonstrate Left GH joint AROM WFL s symptoms to facilitate usual overhead reaching, self care, dressing at PLOF.    Goal status: Ongoing  11/08/2023   6.  Patient will demonstrate LUE grip strength </= 10lb different than RUE  Goal status: Ongoing  11/08/2023    PLAN:  PT FREQUENCY:  1-3x/week  PT DURATION: 8 weeks  PLANNED INTERVENTIONS: Can include 16109- PT Re-evaluation, 97110-Therapeutic exercises, 97530- Therapeutic activity, 97112- Neuromuscular re-education, 97535- Self Care, 97140- Manual therapy, [phone]- Gait training, [phone]- Orthotic Fit/training, [phone]- Canalith repositioning, [id number]- Aquatic Therapy, [phone]- Electrical stimulation (unattended), [phone]- Electrical stimulation (manual), [id number] Physical performance testing, 97016- Vasopneumatic device, [id number]- Ultrasound, [id number]- Traction (mechanical), [id number]- Ionotophoresis 4mg /ml Dexamethasone, Patient/Family education, Balance training, Stair training, Taping, Dry Needling, Joint mobilization, Joint manipulation, Spinal manipulation, Spinal mobilization, Scar mobilization, Vestibular training, Visual/preceptual remediation/compensation, DME instructions, Cryotherapy, and Moist heat.  All performed as medically necessary.  All included unless contraindicated  PLAN FOR NEXT SESSION: Check STG's next week.  Continue to update HEP as patient progresses.   PROM geared towards ER, flexion, abd. Incorporate tennis swinging  and movements of LUE to decrease patient awareness of moving LUE as fear of movement is high.      Vladimir Faster, PT, DPT 11/08/2023, 4:51 PM

## 2023-11-13 ENCOUNTER — Other Ambulatory Visit: Payer: Self-pay | Admitting: Nurse Practitioner

## 2023-11-13 DIAGNOSIS — F320 Major depressive disorder, single episode, mild: Secondary | ICD-10-CM

## 2023-11-15 ENCOUNTER — Other Ambulatory Visit: Payer: Self-pay | Admitting: Nurse Practitioner

## 2023-11-15 ENCOUNTER — Ambulatory Visit (INDEPENDENT_AMBULATORY_CARE_PROVIDER_SITE_OTHER): Admitting: Physical Therapy

## 2023-11-15 ENCOUNTER — Encounter: Payer: Self-pay | Admitting: Physical Therapy

## 2023-11-15 DIAGNOSIS — N400 Enlarged prostate without lower urinary tract symptoms: Secondary | ICD-10-CM

## 2023-11-15 DIAGNOSIS — G8929 Other chronic pain: Secondary | ICD-10-CM | POA: Diagnosis not present

## 2023-11-15 DIAGNOSIS — I10 Essential (primary) hypertension: Secondary | ICD-10-CM

## 2023-11-15 DIAGNOSIS — M25512 Pain in left shoulder: Secondary | ICD-10-CM | POA: Diagnosis not present

## 2023-11-15 DIAGNOSIS — M6281 Muscle weakness (generalized): Secondary | ICD-10-CM | POA: Diagnosis not present

## 2023-11-15 DIAGNOSIS — M25612 Stiffness of left shoulder, not elsewhere classified: Secondary | ICD-10-CM | POA: Diagnosis not present

## 2023-11-15 NOTE — Therapy (Signed)
 OUTPATIENT PHYSICAL THERAPY UPPER EXTREMITYTREATMENT   Patient Name: James Pearson MRN: [id number] DOB:[birth year], 78 y.o., male Today's Date: 11/15/2023  END OF SESSION:  PT End of Session - 11/15/23 1419     Visit Number 4    Number of Visits 15    Authorization Type [facility]    Authorization Time Period 09/21/2023-01/19/2024    Authorization - Visit Number 4    Authorization - Number of Visits 15    Progress Note Due on Visit 10    PT Start Time 1345    PT Stop Time 1430    PT Time Calculation (min) 45 min    Activity Tolerance Patient tolerated treatment well    Behavior During Therapy WFL for tasks assessed/performed               Past Medical History:  Diagnosis Date   Arthritis    Diabetes mellitus without complication (HCC)    GERD (gastroesophageal reflux disease)    Hepatitis    Hypertension    Parkinson's disease (HCC)    Positive TB test    as a child   Stroke (HCC) 08/10/2011   Past Surgical History:  Procedure Laterality Date   CHOLECYSTECTOMY     HAND SURGERY     9 subsequent surgeries   REVERSE SHOULDER ARTHROPLASTY Left 01/27/2023   Procedure: REVERSE SHOULDER ARTHROPLASTY;  Surgeon: Francena Hanly, MD;  Location: [facility];  Service: Orthopedics;  Laterality: Left;    rotator cuff surgery Right    Patient Active Problem List   Diagnosis Date Noted   Fatigue 07/18/2023   Snoring 05/23/2023   Preoperative clearance 01/19/2023   Callus 10/28/2022   Isolated memory impairment 07/23/2022   Lightheadedness 07/07/2022   Medication refill 05/11/2022   Pain of left eye 05/11/2022   Red eye 05/11/2022   Lung nodule seen on imaging study 04/26/2022   Lung nodule 04/26/2022   Abnormal chest x-ray 04/26/2022   DOE (dyspnea on exertion) 04/22/2022   Acute non-recurrent maxillary sinusitis 04/08/2022   High risk medication use 03/22/2022   Elevated hemoglobin (HCC) 03/22/2022   Former smoker 01/28/2022   Unexplained weight  loss 01/28/2022   Bilateral edema of lower extremity 10/09/2021   Pain, dental 10/09/2021   Preventative health care 10/09/2021   Tremor 09/18/2021   Left hip pain 09/18/2021   Anxiety and depression 08/26/2021   Acute pain of left knee 08/26/2021   Grief 08/26/2021   Rotator cuff tear arthropathy 06/23/2021   Psychosexual dysfunction with inhibited sexual excitement 06/23/2021   Obesity 06/23/2021   Benign neoplasm of colon 06/23/2021   Carpal tunnel syndrome 06/23/2021   History of adenomatous polyp of colon 06/23/2021   Brachial neuritis 06/23/2021   Parkinson's disease (HCC) 06/23/2021   Dysuria 03/18/2021   Low testosterone in male 03/18/2021   Hypokalemia 01/06/2021   Pain in left shoulder 11/05/2020   Bilateral primary osteoarthritis of knee 12/19/2019   Unilateral primary osteoarthritis, right knee 10/30/2019   Insomnia 05/06/2015   Essential hypertension 05/06/2015   Chronic back pain 05/06/2015   Type 2 diabetes mellitus without complication (HCC) 05/06/2015   Stroke (HCC) 05/06/2015   HLD (hyperlipidemia) 05/06/2015   BPH (benign prostatic hyperplasia) 05/06/2015   Esophageal reflux 05/06/2015   History of TB (tuberculosis) 05/06/2015   Iron deficiency 04/10/2015    PCP: Eden Emms, NP  REFERRING PROVIDER: Wendi Maya, MD   REFERRING DIAG:  [phone] (ICD-10-CM) - Presence of right artificial shoulder  joint  L SHOULDER PAIN S/P L RTSA   THERAPY DIAG:  Chronic left shoulder pain  Stiffness of left shoulder, not elsewhere classified  Muscle weakness (generalized)  Left shoulder pain, unspecified chronicity  Rationale for Evaluation and Treatment: Rehabilitation  ONSET DATE: January 27, 2023 S/P rTSA  SUBJECTIVE:                                                                                                                                                                                      SUBJECTIVE STATEMENT: He relays 3-4 pain today upon  arrival. He can tell he is making some progress.   PERTINENT HISTORY: S/P rTSA 01/27/23, Chronic LBP, HTN, HLD, CVA (date unknown), DM2, Pagets disease of bone, action tremor  PAIN:  NPRS scale:  3-4/10 Pain location: L shoulder Pain description: "tooth ache pain" Aggravating factors: reaching Relieving factors: ice, rest  PRECAUTIONS: None  WEIGHT BEARING RESTRICTIONS: No  FALLS:  Has patient fallen in last 6 months? No  LIVING ENVIRONMENT: Lives with: lives alone Lives in: House/apartment Stairs:  NA Has following equipment at home:  NA  OCCUPATION: Retired- [employer]  PLOF: Independent  PATIENT GOALS: play pickle ball again  Next MD visit: none scheduled  OBJECTIVE:   DIAGNOSTIC FINDINGS:  None on file from post surgery  PATIENT SURVEYS:  Patient-Specific Activity Scoring Scheme  "0" represents "unable to perform." "10" represents "able to perform at prior level. 0 1 2 3 4 5 6 7 8 9  10 (Date and Score)   Activity Eval     1. Lifting above head 2    2. Reaching arm in front of him  0    3. Close doors 4   4.    5.    Score 6/30    Total score = sum of the activity scores/number of activities Minimum detectable change (90%CI) for average score = 2 points Minimum detectable change (90%CI) for single activity score = 3 points  COGNITION: Overall cognitive status: WFL     SENSATION: Light touch: Impaired C3-C7 on Lt side with patient reporting 70% sensation compared to Rt side.    POSTURE: L shoulder rounded forward more than R, however both are rounded.   UPPER EXTREMITY ROM:   ROM Right eval Left eval Left 11/15/23  Shoulder flexion 120 P: 90 A: 10 P:110  Shoulder extension [facility] WFL   Shoulder abduction 160 65 P:90  Shoulder adduction     Shoulder internal rotation Touches stomach Touches stomach   Shoulder external rotation 40 P: 4 A: 2 P:40  Elbow flexion [facility] WFL   Elbow extension [facility]   Wrist  flexion     Wrist  extension     Wrist ulnar deviation     Wrist radial deviation     Wrist pronation     Wrist supination     (Blank rows = not tested)  UPPER EXTREMITY MMT:  MMT Right eval Left eval  Shoulder flexion 5 2  Shoulder extension  3  Shoulder abduction 4 2  Shoulder adduction    Shoulder internal rotation 5 3  Shoulder external rotation 5 3  Middle trapezius    Lower trapezius    Elbow flexion 4 4-  Elbow extension 4 4-  Wrist flexion    Wrist extension    Wrist ulnar deviation  3+  Wrist radial deviation  3+  Wrist pronation  4  Wrist supination  4  Grip strength (lbs)  Did not test with dynamometer;MMT 3+  (Blank rows = not tested)  SHOULDER SPECIAL TESTS: Not tested  JOINT MOBILITY TESTING:  Inferior glide- very stiff and end ROM A-P and P-A: normal at mid ROM  PALPATION:  Non-tender throughout                                                                                                                                                                                                  TODAY'S TREATMENT:                                                                                                        DATE:  TREATMENT:                                                                                                        DATE: 11/15/2023 Theractivity :(ROM and or strength for reaching) UBE L1 3  min forward, 3 min backward seat #8 Pendulums X 20 CW,CCW, front/back, sideways UE ranger X 10 flexion, circles X 15 CW and CCW Wall ladder X 10 flexion to #20 at best Supine wand flexion AAROM 2# bar 5 sec hold X 15 Supine wand chest press 2# bar X15 Supine wand abduction AAROM 2# X 15 Supine wand ER 2# bar 2X15 Supine shoulder flexion AROM with 1# X 10  Manual: Left shoulder PROM with overpressure within pain tolerance flexion, abd, ER planes, GH mobs grade 1-3 for inferior glides, and distraction glides.   11/08/2023 Therapeutic Exercise: Pendulums X 20 CW, CCW,  flexion/ext & abd/add.  PT cued on position to increase range and start / end small progressing to max motion.  Pt return demo understanding.   Pulleys 2 min flexion.  PT cueing on hand position and following left hand with eyes to facilitate proper coordinated trunk motions.  Pulleys 2 min scaption / abduction PT cueing on hand position and following left hand with eyes to facilitate proper coordinated trunk motions.  Supine wand flexion 1# bar 5 sec hold X 10 reps 2 sets.  Switched to elbow bent 90* for shorter lever arm.   Supine wand chest press 1# bar 2X10 Supine bilateral scapular retraction 5-second hold 10 reps Supine scapular depression reaching left upper extremity downward 5-second hold 10 reps.  PT tactile cues at shoulders to prevent anterior rolling.   Therapeutic activities: Patient use tennis racquet choked up to simulate pickleball racquet. He was able to toss badminton birdies with left upper extremity to hit with the racquet in his dominant right upper extremity.  PT cued that he was using more elbow flexion than shoulder motion to toss the badminton Birdie. Worked on tossing the badminton birdie using shoulder motion.  Progressed to tossing the birdie over chair back to a high target on the wall to facilitate shoulder flexion motion.  Patient improved with PT education.  Manual therapy: Contract relax for range for left shoulder rotation internal & external, shoulder adduction, abduction and flexion. Soft tissue mobilizations to axilla and pectoralis.        DATE:  10/26/2023 Therex:    HEP instruction/performance c cues for techniques, handout provided.  Trial set performed of each for comprehension and symptom assessment.  See below for exercise list  SelfCare: education on importance of consistent stretching and exercise program for his shoulder 3-5 times per day to improve ROM and function of shoulder. Education to push to the edge of pain but not push through the  pain so that he can stretch his shoulder but not irritate it.   PATIENT EDUCATION: Education details: HEP, POC, pain and how hard to push with his exercises Person educated: Patient Education method: Explanation, Demonstration, Verbal cues, and Handouts Education comprehension: verbalized understanding, returned demonstration, and verbal cues required  HOME EXERCISE PROGRAM: Access Code: [email or web address] URL: [email or web address] Date: 10/27/2023 Prepared by: Ivery Quale  Exercises - Supine Shoulder Flexion Extension AAROM with Dowel  - 3-5 x daily - 7 x weekly - 1 sets - 10 reps - 5 sec hold - Supine Shoulder External Rotation in 45 Degrees Abduction AAROM with Dowel  - 3-5 x daily - 7 x weekly - 1 sets - 10 reps - 5 sec hold - Circular Shoulder Pendulum with Table Support  - 3-5 x daily - 7 x weekly - 1 sets - 15 reps - Seated Shoulder Abduction Towel Slide at Table Top  - 3-5 x daily - 6  x weekly - 1 sets - 10 reps - 5 sec hold - Seated Shoulder External Rotation PROM on Table  - 3-5 x daily - 6 x weekly - 1 sets - 10 reps - 5 hold  ASSESSMENT:  CLINICAL IMPRESSION: He is making some progress overall with pain and ROM however still functional limitations in strength and ROM that we will continue to work to improve with PT.  OBJECTIVE IMPAIRMENTS: decreased activity tolerance, decreased knowledge of condition, decreased mobility, decreased ROM, decreased strength, hypomobility, increased edema, increased fascial restrictions, impaired perceived functional ability, increased muscle spasms, impaired sensation, impaired UE functional use, and pain.   ACTIVITY LIMITATIONS: carrying, lifting, bending, sitting, sleeping, bathing, dressing, reach over head, and hygiene/grooming  PARTICIPATION LIMITATIONS: meal prep, cleaning, laundry, driving, shopping, community activity, and yard work  PERSONAL FACTORS: Age, Past/current experiences, Time since onset of  injury/illness/exacerbation, and 3+ comorbidities: see above  are also affecting patient's functional outcome.   REHAB POTENTIAL: Fair Shoulder has been stiff since June 2024 with patient also fearful of movement due to pain.  CLINICAL DECISION MAKING: Evolving/moderate complexity  EVALUATION COMPLEXITY: Moderate   GOALS: Goals reviewed with patient? Yes  SHORT TERM GOALS: (target date for Short term goals are 3 weeks 11/16/2023)  1.Patient will demonstrate independent use of home exercise program to maintain progress from in clinic treatments. Goal status: MET 11/15/23  LONG TERM GOALS: (target dates for all long term goals are 8 weeks  12/21/2023 )   1. Patient will demonstrate/report pain at worst less than or equal to 2/10  at rest and less than 5/10 with reaching  Goal status: Ongoing  11/08/2023   2. Patient will demonstrate independent use of home exercise program to facilitate ability to maintain/progress functional gains from skilled physical therapy services. Goal status: Ongoing  11/08/2023   3. Patient will demonstrate Patient specific functional scale avg > or = 15/30 to indicate reduced disability due to condition.  Goal status: Ongoing  11/08/2023   4.  Patient will demonstrate LUE MMT 4/5 throughout to facilitate lifting, reaching, carrying at [facility] in daily activity.   Goal status: Ongoing  11/08/2023   5.  Patient will demonstrate Left GH joint AROM WFL s symptoms to facilitate usual overhead reaching, self care, dressing at PLOF.    Goal status: Ongoing  11/08/2023   6.  Patient will demonstrate LUE grip strength </= 10lb different than RUE  Goal status: Ongoing  11/08/2023    PLAN:  PT FREQUENCY: 1-3x/week  PT DURATION: 8 weeks  PLANNED INTERVENTIONS: Can include 14782- PT Re-evaluation, 97110-Therapeutic exercises, 97530- Therapeutic activity, 97112- Neuromuscular re-education, 97535- Self Care, 97140- Manual therapy, [phone]- Gait training, [phone]- Orthotic  Fit/training, [phone]- Canalith repositioning, [id number]- Aquatic Therapy, [phone]- Electrical stimulation (unattended), [phone]- Electrical stimulation (manual), [id number] Physical performance testing, 97016- Vasopneumatic device, [id number]- Ultrasound, [id number]- Traction (mechanical), [id number]- Ionotophoresis 4mg /ml Dexamethasone, Patient/Family education, Balance training, Stair training, Taping, Dry Needling, Joint mobilization, Joint manipulation, Spinal manipulation, Spinal mobilization, Scar mobilization, Vestibular training, Visual/preceptual remediation/compensation, DME instructions, Cryotherapy, and Moist heat.  All performed as medically necessary.  All included unless contraindicated  PLAN FOR NEXT SESSION:   PROM geared towards ER, flexion, abd.     April Manson, PT, DPT 11/15/2023, 2:36 PM

## 2023-11-17 ENCOUNTER — Encounter: Payer: Self-pay | Admitting: Physical Therapy

## 2023-11-17 ENCOUNTER — Other Ambulatory Visit: Payer: Self-pay | Admitting: Nurse Practitioner

## 2023-11-17 ENCOUNTER — Ambulatory Visit (INDEPENDENT_AMBULATORY_CARE_PROVIDER_SITE_OTHER): Admitting: Physical Therapy

## 2023-11-17 DIAGNOSIS — G8929 Other chronic pain: Secondary | ICD-10-CM | POA: Diagnosis not present

## 2023-11-17 DIAGNOSIS — M25612 Stiffness of left shoulder, not elsewhere classified: Secondary | ICD-10-CM | POA: Diagnosis not present

## 2023-11-17 DIAGNOSIS — M6281 Muscle weakness (generalized): Secondary | ICD-10-CM

## 2023-11-17 DIAGNOSIS — M25512 Pain in left shoulder: Secondary | ICD-10-CM | POA: Diagnosis not present

## 2023-11-17 DIAGNOSIS — F320 Major depressive disorder, single episode, mild: Secondary | ICD-10-CM

## 2023-11-17 NOTE — Telephone Encounter (Signed)
 Copied from CRM [id number]. Topic: Clinical - Medication Refill >> Nov 17, 2023  9:51 AM Alcus Dad wrote: Most Recent Primary Care Visit:  Provider: Eden Emms  Department: [facility]  Visit Type: OFFICE VISIT  Date: 10/31/2023  Medication: sertraline (ZOLOFT) 50 MG tablet   Has the patient contacted their pharmacy? Yes (Agent: If no, request that the patient contact the pharmacy for the refill. If patient does not wish to contact the pharmacy document the reason why and proceed with request.) (Agent: If yes, when and what did the pharmacy advise?)  Is this the correct pharmacy for this prescription? Yes If no, delete pharmacy and type the correct one.  This is the patient's preferred pharmacy:   [facility] - [place], [place] - [address] [facility] [address] [facility] [address] [place] [address] [place] [address] Phone: [phone] Fax: [phone]    Has the prescription been filled recently? Yes  Is the patient out of the medication? No  Has the patient been seen for an appointment in the last year OR does the patient have an upcoming appointment? Yes  Can we respond through MyChart? Yes  Agent: Please be advised that Rx refills may take up to 3 business days. We ask that you follow-up with your pharmacy.

## 2023-11-17 NOTE — Therapy (Signed)
 OUTPATIENT PHYSICAL THERAPY UPPER EXTREMITYTREATMENT   Patient Name: James Pearson MRN: [id number] DOB:[birth year], 78 y.o., male Today's Date: 11/17/2023  END OF SESSION:  PT End of Session - 11/17/23 1355     Visit Number 5    Number of Visits 15    Authorization Type [facility]    Authorization Time Period 09/21/2023-01/19/2024    Authorization - Visit Number 5    Authorization - Number of Visits 15    Progress Note Due on Visit 10    PT Start Time 1347    PT Stop Time 1430    PT Time Calculation (min) 43 min    Activity Tolerance Patient tolerated treatment well    Behavior During Therapy WFL for tasks assessed/performed               Past Medical History:  Diagnosis Date   Arthritis    Diabetes mellitus without complication (HCC)    GERD (gastroesophageal reflux disease)    Hepatitis    Hypertension    Parkinson's disease (HCC)    Positive TB test    as a child   Stroke (HCC) 08/10/2011   Past Surgical History:  Procedure Laterality Date   CHOLECYSTECTOMY     HAND SURGERY     9 subsequent surgeries   REVERSE SHOULDER ARTHROPLASTY Left 01/27/2023   Procedure: REVERSE SHOULDER ARTHROPLASTY;  Surgeon: Francena Hanly, MD;  Location: [facility];  Service: Orthopedics;  Laterality: Left;    rotator cuff surgery Right    Patient Active Problem List   Diagnosis Date Noted   Fatigue 07/18/2023   Snoring 05/23/2023   Preoperative clearance 01/19/2023   Callus 10/28/2022   Isolated memory impairment 07/23/2022   Lightheadedness 07/07/2022   Medication refill 05/11/2022   Pain of left eye 05/11/2022   Red eye 05/11/2022   Lung nodule seen on imaging study 04/26/2022   Lung nodule 04/26/2022   Abnormal chest x-ray 04/26/2022   DOE (dyspnea on exertion) 04/22/2022   Acute non-recurrent maxillary sinusitis 04/08/2022   High risk medication use 03/22/2022   Elevated hemoglobin (HCC) 03/22/2022   Former smoker 01/28/2022   Unexplained weight  loss 01/28/2022   Bilateral edema of lower extremity 10/09/2021   Pain, dental 10/09/2021   Preventative health care 10/09/2021   Tremor 09/18/2021   Left hip pain 09/18/2021   Anxiety and depression 08/26/2021   Acute pain of left knee 08/26/2021   Grief 08/26/2021   Rotator cuff tear arthropathy 06/23/2021   Psychosexual dysfunction with inhibited sexual excitement 06/23/2021   Obesity 06/23/2021   Benign neoplasm of colon 06/23/2021   Carpal tunnel syndrome 06/23/2021   History of adenomatous polyp of colon 06/23/2021   Brachial neuritis 06/23/2021   Parkinson's disease (HCC) 06/23/2021   Dysuria 03/18/2021   Low testosterone in male 03/18/2021   Hypokalemia 01/06/2021   Pain in left shoulder 11/05/2020   Bilateral primary osteoarthritis of knee 12/19/2019   Unilateral primary osteoarthritis, right knee 10/30/2019   Insomnia 05/06/2015   Essential hypertension 05/06/2015   Chronic back pain 05/06/2015   Type 2 diabetes mellitus without complication (HCC) 05/06/2015   Stroke (HCC) 05/06/2015   HLD (hyperlipidemia) 05/06/2015   BPH (benign prostatic hyperplasia) 05/06/2015   Esophageal reflux 05/06/2015   History of TB (tuberculosis) 05/06/2015   Iron deficiency 04/10/2015    PCP: Eden Emms, NP  REFERRING PROVIDER: Wendi Maya, MD   REFERRING DIAG:  [phone] (ICD-10-CM) - Presence of right artificial shoulder  joint  L SHOULDER PAIN S/P L RTSA   THERAPY DIAG:  Chronic left shoulder pain  Stiffness of left shoulder, not elsewhere classified  Muscle weakness (generalized)  Left shoulder pain, unspecified chronicity  Rationale for Evaluation and Treatment: Rehabilitation  ONSET DATE: January 27, 2023 S/P rTSA  SUBJECTIVE:                                                                                                                                                                                      SUBJECTIVE STATEMENT: He relays some soreness today,  he has been working his shoulder hard at home  PERTINENT HISTORY: S/P rTSA 01/27/23, Chronic LBP, HTN, HLD, CVA (date unknown), DM2, Pagets disease of bone, action tremor  PAIN:  NPRS scale:  6/10 Pain location: L shoulder Pain description: "tooth ache pain" Aggravating factors: reaching Relieving factors: ice, rest  PRECAUTIONS: None  WEIGHT BEARING RESTRICTIONS: No  FALLS:  Has patient fallen in last 6 months? No  LIVING ENVIRONMENT: Lives with: lives alone Lives in: House/apartment Stairs:  NA Has following equipment at home:  NA  OCCUPATION: Retired- [employer]  PLOF: Independent  PATIENT GOALS: play pickle ball again  Next MD visit: none scheduled  OBJECTIVE:   DIAGNOSTIC FINDINGS:  None on file from post surgery  PATIENT SURVEYS:  Patient-Specific Activity Scoring Scheme  "0" represents "unable to perform." "10" represents "able to perform at prior level. 0 1 2 3 4 5 6 7 8 9  10 (Date and Score)   Activity Eval     1. Lifting above head 2    2. Reaching arm in front of him  0    3. Close doors 4   4.    5.    Score 6/30    Total score = sum of the activity scores/number of activities Minimum detectable change (90%CI) for average score = 2 points Minimum detectable change (90%CI) for single activity score = 3 points  COGNITION: Overall cognitive status: WFL     SENSATION: Light touch: Impaired C3-C7 on Lt side with patient reporting 70% sensation compared to Rt side.    POSTURE: L shoulder rounded forward more than R, however both are rounded.   UPPER EXTREMITY ROM:   ROM Right eval Left eval Left 11/15/23  Shoulder flexion 120 P: 90 A: 10 P:110  Shoulder extension [facility]   Shoulder abduction 160 65 P:90  Shoulder adduction     Shoulder internal rotation Touches stomach Touches stomach   Shoulder external rotation 40 P: 4 A: 2 P:40  Elbow flexion [facility] WFL   Elbow extension [facility] WFL   Wrist flexion  Wrist extension      Wrist ulnar deviation     Wrist radial deviation     Wrist pronation     Wrist supination     (Blank rows = not tested)  UPPER EXTREMITY MMT:  MMT Right eval Left eval  Shoulder flexion 5 2  Shoulder extension  3  Shoulder abduction 4 2  Shoulder adduction    Shoulder internal rotation 5 3  Shoulder external rotation 5 3  Middle trapezius    Lower trapezius    Elbow flexion 4 4-  Elbow extension 4 4-  Wrist flexion    Wrist extension    Wrist ulnar deviation  3+  Wrist radial deviation  3+  Wrist pronation  4  Wrist supination  4  Grip strength (lbs)  Did not test with dynamometer;MMT 3+  (Blank rows = not tested)  SHOULDER SPECIAL TESTS: Not tested  JOINT MOBILITY TESTING:  Inferior glide- very stiff and end ROM A-P and P-A: normal at mid ROM  PALPATION:  Non-tender throughout                                                                                                                                                                                                 TREATMENT:                                                                                                        DATE: 11/17/2023 Theractivity :(ROM and or strength for reaching) UBE L2 3 min forward, 3 min backward seat #8 Pulleys 2 min flexion, 2 min abduction UE ranger X 10 flexion, circles X 15 CW and CCW Lifting left arm into first shelf of top cabinet, 1# 2X10 Wall walks rolling pball up wall.  Wall ladder X 10 flexion to #20 at best Supine wand chest press into  shoulder flexion AAROM 2# bar 5 sec hold X 15 Supine wand abduction AAROM 2# X 15 Supine wand ER 2# bar 2X15 Supine shoulder flexion AROM with 1# X 10  Manual: Left shoulder PROM with overpressure within pain tolerance flexion, abd, ER planes, GH mobs grade 1-3 for inferior glides, and distraction glides.  TREATMENT:                                                                                                        DATE:  11/15/2023 Theractivity :(ROM and or strength for reaching) UBE L1 3 min forward, 3 min backward seat #8 Pendulums X 20 CW,CCW, front/back, sideways UE ranger X 10 flexion, circles X 15 CW and CCW Wall ladder X 10 flexion to #20 at best Supine wand flexion AAROM 2# bar 5 sec hold X 15 Supine wand chest press 2# bar X15 Supine wand abduction AAROM 2# X 15 Supine wand ER 2# bar 2X15 Supine shoulder flexion AROM with 1# X 10  Manual: Left shoulder PROM with overpressure within pain tolerance flexion, abd, ER planes, GH mobs grade 1-3 for inferior glides, and distraction glides.     PATIENT EDUCATION: Education details: HEP, POC, pain and how hard to push with his exercises Person educated: Patient Education method: Explanation, Demonstration, Verbal cues, and Handouts Education comprehension: verbalized understanding, returned demonstration, and verbal cues required  HOME EXERCISE PROGRAM: Access Code: [email or web address] URL: [email or web address] Date: 10/27/2023 Prepared by: Ivery Quale  Exercises - Supine Shoulder Flexion Extension AAROM with Dowel  - 3-5 x daily - 7 x weekly - 1 sets - 10 reps - 5 sec hold - Supine Shoulder External Rotation in 45 Degrees Abduction AAROM with Dowel  - 3-5 x daily - 7 x weekly - 1 sets - 10 reps - 5 sec hold - Circular Shoulder Pendulum with Table Support  - 3-5 x daily - 7 x weekly - 1 sets - 15 reps - Seated Shoulder Abduction Towel Slide at Table Top  - 3-5 x daily - 6 x weekly - 1 sets - 10 reps - 5 sec hold - Seated Shoulder External Rotation PROM on Table  - 3-5 x daily - 6 x weekly - 1 sets - 10 reps - 5 hold  ASSESSMENT:  CLINICAL IMPRESSION: He is still limited by shoulder stiffness with adhesive capsulitis. Pain and function have slowly improved since starting PT so continued PT recommended.   OBJECTIVE IMPAIRMENTS: decreased activity tolerance, decreased knowledge of condition, decreased mobility, decreased ROM, decreased  strength, hypomobility, increased edema, increased fascial restrictions, impaired perceived functional ability, increased muscle spasms, impaired sensation, impaired UE functional use, and pain.   ACTIVITY LIMITATIONS: carrying, lifting, bending, sitting, sleeping, bathing, dressing, reach over head, and hygiene/grooming  PARTICIPATION LIMITATIONS: meal prep, cleaning, laundry, driving, shopping, community activity, and yard work  PERSONAL FACTORS: Age, Past/current experiences, Time since onset of injury/illness/exacerbation, and 3+ comorbidities: see above  are also affecting patient's functional outcome.   REHAB POTENTIAL: Fair Shoulder has been stiff since June 2024 with patient also fearful of movement due to pain.  CLINICAL DECISION MAKING: Evolving/moderate complexity  EVALUATION COMPLEXITY: Moderate   GOALS: Goals reviewed with patient? Yes  SHORT TERM GOALS: (target date for Short term goals are 3 weeks 11/16/2023)  1.Patient will demonstrate independent use of home exercise program to maintain progress from in clinic treatments. Goal status: MET 11/15/23  LONG  TERM GOALS: (target dates for all long term goals are 8 weeks  12/21/2023 )   1. Patient will demonstrate/report pain at worst less than or equal to 2/10  at rest and less than 5/10 with reaching  Goal status: Ongoing  11/08/2023   2. Patient will demonstrate independent use of home exercise program to facilitate ability to maintain/progress functional gains from skilled physical therapy services. Goal status: Ongoing  11/08/2023   3. Patient will demonstrate Patient specific functional scale avg > or = 15/30 to indicate reduced disability due to condition.  Goal status: Ongoing  11/08/2023   4.  Patient will demonstrate LUE MMT 4/5 throughout to facilitate lifting, reaching, carrying at [facility] in daily activity.   Goal status: Ongoing  11/08/2023   5.  Patient will demonstrate Left GH joint AROM WFL s symptoms to facilitate  usual overhead reaching, self care, dressing at PLOF.    Goal status: Ongoing  11/08/2023   6.  Patient will demonstrate LUE grip strength </= 10lb different than RUE  Goal status: Ongoing  11/08/2023    PLAN:  PT FREQUENCY: 1-3x/week  PT DURATION: 8 weeks  PLANNED INTERVENTIONS: Can include 16109- PT Re-evaluation, 97110-Therapeutic exercises, 97530- Therapeutic activity, 97112- Neuromuscular re-education, 97535- Self Care, 97140- Manual therapy, [phone]- Gait training, [phone]- Orthotic Fit/training, [phone]- Canalith repositioning, [id number]- Aquatic Therapy, [phone]- Electrical stimulation (unattended), [phone]- Electrical stimulation (manual), [id number] Physical performance testing, 97016- Vasopneumatic device, [id number]- Ultrasound, [id number]- Traction (mechanical), [id number]- Ionotophoresis 4mg /ml Dexamethasone, Patient/Family education, Balance training, Stair training, Taping, Dry Needling, Joint mobilization, Joint manipulation, Spinal manipulation, Spinal mobilization, Scar mobilization, Vestibular training, Visual/preceptual remediation/compensation, DME instructions, Cryotherapy, and Moist heat.  All performed as medically necessary.  All included unless contraindicated  PLAN FOR NEXT SESSION:   strength and ROM geared towards ER, flexion, abd.     April Manson, PT, DPT 11/17/2023, 1:56 PM

## 2023-11-22 ENCOUNTER — Ambulatory Visit (INDEPENDENT_AMBULATORY_CARE_PROVIDER_SITE_OTHER): Admitting: Physical Therapy

## 2023-11-22 ENCOUNTER — Encounter: Payer: Self-pay | Admitting: Physical Therapy

## 2023-11-22 DIAGNOSIS — M6281 Muscle weakness (generalized): Secondary | ICD-10-CM

## 2023-11-22 DIAGNOSIS — M25512 Pain in left shoulder: Secondary | ICD-10-CM | POA: Diagnosis not present

## 2023-11-22 DIAGNOSIS — M25612 Stiffness of left shoulder, not elsewhere classified: Secondary | ICD-10-CM

## 2023-11-22 DIAGNOSIS — G8929 Other chronic pain: Secondary | ICD-10-CM

## 2023-11-22 NOTE — Therapy (Signed)
 OUTPATIENT PHYSICAL THERAPY UPPER EXTREMITYTREATMENT   Patient Name: James Pearson MRN: [id number] DOB:[birth year], 78 y.o., male Today's Date: 11/22/2023  END OF SESSION:      Past Medical History:  Diagnosis Date   Arthritis    Diabetes mellitus without complication (HCC)    GERD (gastroesophageal reflux disease)    Hepatitis    Hypertension    Parkinson's disease (HCC)    Positive TB test    as a child   Stroke (HCC) 08/10/2011   Past Surgical History:  Procedure Laterality Date   CHOLECYSTECTOMY     HAND SURGERY     9 subsequent surgeries   REVERSE SHOULDER ARTHROPLASTY Left 01/27/2023   Procedure: REVERSE SHOULDER ARTHROPLASTY;  Surgeon: Ellard Gunning, MD;  Location: [facility];  Service: Orthopedics;  Laterality: Left;    rotator cuff surgery Right    Patient Active Problem List   Diagnosis Date Noted   Fatigue 07/18/2023   Snoring 05/23/2023   Preoperative clearance 01/19/2023   Callus 10/28/2022   Isolated memory impairment 07/23/2022   Lightheadedness 07/07/2022   Medication refill 05/11/2022   Pain of left eye 05/11/2022   Red eye 05/11/2022   Lung nodule seen on imaging study 04/26/2022   Lung nodule 04/26/2022   Abnormal chest x-ray 04/26/2022   DOE (dyspnea on exertion) 04/22/2022   Acute non-recurrent maxillary sinusitis 04/08/2022   High risk medication use 03/22/2022   Elevated hemoglobin (HCC) 03/22/2022   Former smoker 01/28/2022   Unexplained weight loss 01/28/2022   Bilateral edema of lower extremity 10/09/2021   Pain, dental 10/09/2021   Preventative health care 10/09/2021   Tremor 09/18/2021   Left hip pain 09/18/2021   Anxiety and depression 08/26/2021   Acute pain of left knee 08/26/2021   Grief 08/26/2021   Rotator cuff tear arthropathy 06/23/2021   Psychosexual dysfunction with inhibited sexual excitement 06/23/2021   Obesity 06/23/2021   Benign neoplasm of colon 06/23/2021   Carpal tunnel syndrome 06/23/2021   History of  adenomatous polyp of colon 06/23/2021   Brachial neuritis 06/23/2021   Parkinson's disease (HCC) 06/23/2021   Dysuria 03/18/2021   Low testosterone in male 03/18/2021   Hypokalemia 01/06/2021   Pain in left shoulder 11/05/2020   Bilateral primary osteoarthritis of knee 12/19/2019   Unilateral primary osteoarthritis, right knee 10/30/2019   Insomnia 05/06/2015   Essential hypertension 05/06/2015   Chronic back pain 05/06/2015   Type 2 diabetes mellitus without complication (HCC) 05/06/2015   Stroke (HCC) 05/06/2015   HLD (hyperlipidemia) 05/06/2015   BPH (benign prostatic hyperplasia) 05/06/2015   Esophageal reflux 05/06/2015   History of TB (tuberculosis) 05/06/2015   Iron deficiency 04/10/2015    PCP: Dorothe Gaster, NP  REFERRING PROVIDER: Achille Ache, MD   REFERRING DIAG:  [phone] (ICD-10-CM) - Presence of right artificial shoulder joint  L SHOULDER PAIN S/P L RTSA   THERAPY DIAG:  No diagnosis found.  Rationale for Evaluation and Treatment: Rehabilitation  ONSET DATE: January 27, 2023 S/P rTSA  SUBJECTIVE:  SUBJECTIVE STATEMENT: He relays shoulder is feeling about the same.  PERTINENT HISTORY: S/P rTSA 01/27/23, Chronic LBP, HTN, HLD, CVA (date unknown), DM2, Pagets disease of bone, action tremor  PAIN:  NPRS scale:  4/10 Pain location: L shoulder Pain description: "tooth ache pain" Aggravating factors: reaching Relieving factors: ice, rest  PRECAUTIONS: None  WEIGHT BEARING RESTRICTIONS: No  FALLS:  Has patient fallen in last 6 months? No  LIVING ENVIRONMENT: Lives with: lives alone Lives in: House/apartment Stairs:  NA Has following equipment at home:  NA  OCCUPATION: Retired- [employer]  PLOF: Independent  PATIENT GOALS: play pickle ball  again  Next MD visit: none scheduled  OBJECTIVE:   DIAGNOSTIC FINDINGS:  None on file from post surgery  PATIENT SURVEYS:  Patient-Specific Activity Scoring Scheme  "0" represents "unable to perform." "10" represents "able to perform at prior level. 0 1 2 3 4 5 6 7 8 9  10 (Date and Score)   Activity Eval     1. Lifting above head 2    2. Reaching arm in front of him  0    3. Close doors 4   4.    5.    Score 6/30    Total score = sum of the activity scores/number of activities Minimum detectable change (90%CI) for average score = 2 points Minimum detectable change (90%CI) for single activity score = 3 points  COGNITION: Overall cognitive status: WFL     SENSATION: Light touch: Impaired C3-C7 on Lt side with patient reporting 70% sensation compared to Rt side.    POSTURE: L shoulder rounded forward more than R, however both are rounded.   UPPER EXTREMITY ROM:   ROM Right eval Left eval Left 11/15/23  Shoulder flexion 120 P: 90 A: 10 P:110  Shoulder extension [facility] WFL   Shoulder abduction 160 65 P:90  Shoulder adduction     Shoulder internal rotation Touches stomach Touches stomach   Shoulder external rotation 40 P: 4 A: 2 P:40  Elbow flexion [facility] WFL   Elbow extension [facility] WFL   Wrist flexion     Wrist extension     Wrist ulnar deviation     Wrist radial deviation     Wrist pronation     Wrist supination     (Blank rows = not tested)  UPPER EXTREMITY MMT:  MMT Right eval Left eval  Shoulder flexion 5 2  Shoulder extension  3  Shoulder abduction 4 2  Shoulder adduction    Shoulder internal rotation 5 3  Shoulder external rotation 5 3  Middle trapezius    Lower trapezius    Elbow flexion 4 4-  Elbow extension 4 4-  Wrist flexion    Wrist extension    Wrist ulnar deviation  3+  Wrist radial deviation  3+  Wrist pronation  4  Wrist supination  4  Grip strength (lbs)  Did not test with dynamometer;MMT 3+  (Blank rows = not tested)  SHOULDER  SPECIAL TESTS: Not tested  JOINT MOBILITY TESTING:  Inferior glide- very stiff and end ROM A-P and P-A: normal at mid ROM  PALPATION:  Non-tender throughout  TREATMENT:                                                                                                        DATE: 11/22/2023 Theractivity :(ROM and or strength for reaching) UBE L2 3 min forward, 3 min backward seat #8 Pulleys 2 min flexion, 2 min abduction UE ranger X 10 flexion, circles X 15 CW and CCW Lifting left arm into first shelf of top cabinet, 2# 2X10 Wall walks rolling pball up wall X 10.  Wall ladder X 10 flexion to #20 at best Therex Supine  shoulder flexion 1# X 10 AROM Supine wand abduction AAROM 1#  bar 5 sec X 10 Supine wand ER 1# bar 5 sec hold X 10 Supine shoulder flexion AROM with 1# X 10   DATE: 11/17/2023 Theractivity :(ROM and or strength for reaching) UBE L2 3 min forward, 3 min backward seat #8 Pulleys 2 min flexion, 2 min abduction UE ranger X 10 flexion, circles X 15 CW and CCW Lifting left arm into first shelf of top cabinet, 1# 2X10 Wall walks rolling pball up wall.  Wall ladder X 10 flexion to #20 at best Supine wand chest press into  shoulder flexion AAROM 2# bar 5 sec hold X 15 Supine wand abduction AAROM 2# X 15 Supine wand ER 2# bar 2X15 Supine shoulder flexion AROM with 1# X 10  Manual: Left shoulder PROM with overpressure within pain tolerance flexion, abd, ER planes, GH mobs grade 1-3 for inferior glides, and distraction glides.   TREATMENT:                                                                                                        DATE: 11/15/2023 Theractivity :(ROM and or strength for reaching) UBE L1 3 min forward, 3 min backward seat #8 Pendulums X 20 CW,CCW, front/back,  sideways UE ranger X 10 flexion, circles X 15 CW and CCW Wall ladder X 10 flexion to #20 at best Supine wand flexion AAROM 2# bar 5 sec hold X 15 Supine wand chest press 2# bar X15 Supine wand abduction AAROM 2# X 15 Supine wand ER 2# bar 2X15 Supine shoulder flexion AROM with 1# X 10  Manual: Left shoulder PROM with overpressure within pain tolerance flexion, abd, ER planes, GH mobs grade 1-3 for inferior glides, and distraction glides.     PATIENT EDUCATION: Education details: HEP, POC, pain and how hard to push with his exercises Person educated: Patient Education method: Explanation, Demonstration, Verbal cues, and Handouts Education comprehension: verbalized understanding, returned demonstration, and verbal cues required  HOME EXERCISE PROGRAM: Access Code: [email or web address] URL: [email or web address] Date:  10/27/2023 Prepared by: Ivery Quale  Exercises - Supine Shoulder Flexion Extension AAROM with Dowel  - 3-5 x daily - 7 x weekly - 1 sets - 10 reps - 5 sec hold - Supine Shoulder External Rotation in 45 Degrees Abduction AAROM with Dowel  - 3-5 x daily - 7 x weekly - 1 sets - 10 reps - 5 sec hold - Circular Shoulder Pendulum with Table Support  - 3-5 x daily - 7 x weekly - 1 sets - 15 reps - Seated Shoulder Abduction Towel Slide at Table Top  - 3-5 x daily - 6 x weekly - 1 sets - 10 reps - 5 sec hold - Seated Shoulder External Rotation PROM on Table  - 3-5 x daily - 6 x weekly - 1 sets - 10 reps - 5 hold  ASSESSMENT:  CLINICAL IMPRESSION: His shoulder was about the same as last.time. PT will continue to work to improve ROM and function of his frozen shoulder and he may eventually need MUA if his progress plateaus.    OBJECTIVE IMPAIRMENTS: decreased activity tolerance, decreased knowledge of condition, decreased mobility, decreased ROM, decreased strength, hypomobility, increased edema, increased fascial restrictions, impaired perceived functional ability, increased  muscle spasms, impaired sensation, impaired UE functional use, and pain.   ACTIVITY LIMITATIONS: carrying, lifting, bending, sitting, sleeping, bathing, dressing, reach over head, and hygiene/grooming  PARTICIPATION LIMITATIONS: meal prep, cleaning, laundry, driving, shopping, community activity, and yard work  PERSONAL FACTORS: Age, Past/current experiences, Time since onset of injury/illness/exacerbation, and 3+ comorbidities: see above  are also affecting patient's functional outcome.   REHAB POTENTIAL: Fair Shoulder has been stiff since June 2024 with patient also fearful of movement due to pain.  CLINICAL DECISION MAKING: Evolving/moderate complexity  EVALUATION COMPLEXITY: Moderate   GOALS: Goals reviewed with patient? Yes  SHORT TERM GOALS: (target date for Short term goals are 3 weeks 11/16/2023)  1.Patient will demonstrate independent use of home exercise program to maintain progress from in clinic treatments. Goal status: MET 11/15/23  LONG TERM GOALS: (target dates for all long term goals are 8 weeks  12/21/2023 )   1. Patient will demonstrate/report pain at worst less than or equal to 2/10  at rest and less than 5/10 with reaching  Goal status: Ongoing  11/08/2023   2. Patient will demonstrate independent use of home exercise program to facilitate ability to maintain/progress functional gains from skilled physical therapy services. Goal status: Ongoing  11/08/2023   3. Patient will demonstrate Patient specific functional scale avg > or = 15/30 to indicate reduced disability due to condition.  Goal status: Ongoing  11/08/2023   4.  Patient will demonstrate LUE MMT 4/5 throughout to facilitate lifting, reaching, carrying at [facility] in daily activity.   Goal status: Ongoing  11/08/2023   5.  Patient will demonstrate Left GH joint AROM WFL s symptoms to facilitate usual overhead reaching, self care, dressing at PLOF.    Goal status: Ongoing  11/08/2023   6.  Patient will demonstrate  LUE grip strength </= 10lb different than RUE  Goal status: Ongoing  11/08/2023    PLAN:  PT FREQUENCY: 1-3x/week  PT DURATION: 8 weeks  PLANNED INTERVENTIONS: Can include 16109- PT Re-evaluation, 97110-Therapeutic exercises, 97530- Therapeutic activity, 97112- Neuromuscular re-education, 97535- Self Care, 97140- Manual therapy, [id number]- Gait training, [phone]- Orthotic Fit/training, [phone]- Canalith repositioning, [id number]- Aquatic Therapy, [id number]- Electrical stimulation (unattended), [id number]- Electrical stimulation (manual), [id number] Physical performance testing, 97016- Vasopneumatic device, [id number]- Ultrasound, [id number]- Traction (mechanical), [id number]- Ionotophoresis  4mg /ml Dexamethasone, Patient/Family education, Balance training, Stair training, Taping, Dry Needling, Joint mobilization, Joint manipulation, Spinal manipulation, Spinal mobilization, Scar mobilization, Vestibular training, Visual/preceptual remediation/compensation, DME instructions, Cryotherapy, and Moist heat.  All performed as medically necessary.  All included unless contraindicated  PLAN FOR NEXT SESSION:   strength and ROM geared towards ER, flexion, abd.     Mick Alamin, PT, DPT 11/22/2023, 1:59 PM

## 2023-11-24 ENCOUNTER — Encounter: Payer: Self-pay | Admitting: Physical Therapy

## 2023-11-24 ENCOUNTER — Ambulatory Visit: Admitting: Physical Therapy

## 2023-11-24 DIAGNOSIS — G8929 Other chronic pain: Secondary | ICD-10-CM | POA: Diagnosis not present

## 2023-11-24 DIAGNOSIS — M25512 Pain in left shoulder: Secondary | ICD-10-CM

## 2023-11-24 DIAGNOSIS — M25612 Stiffness of left shoulder, not elsewhere classified: Secondary | ICD-10-CM

## 2023-11-24 DIAGNOSIS — M6281 Muscle weakness (generalized): Secondary | ICD-10-CM

## 2023-11-24 NOTE — Patient Instructions (Signed)

## 2023-11-24 NOTE — Therapy (Signed)
 OUTPATIENT PHYSICAL THERAPY UPPER EXTREMITYTREATMENT   Patient Name: James Pearson MRN: [id number] DOB:1946-04-01, 78 y.o., male Today's Date: 11/24/2023  END OF SESSION:  PT End of Session - 11/24/23 1354     Visit Number 7    Number of Visits 15    Authorization Type [facility]    Authorization Time Period 09/21/2023-01/19/2024    Authorization - Visit Number 7    Authorization - Number of Visits 15    Progress Note Due on Visit 10    PT Start Time 1349    PT Stop Time 1427    PT Time Calculation (min) 38 min    Activity Tolerance Patient tolerated treatment well    Behavior During Therapy WFL for tasks assessed/performed                Past Medical History:  Diagnosis Date   Arthritis    Diabetes mellitus without complication (HCC)    GERD (gastroesophageal reflux disease)    Hepatitis    Hypertension    Parkinson's disease (HCC)    Positive TB test    as a child   Stroke (HCC) 08/10/2011   Past Surgical History:  Procedure Laterality Date   CHOLECYSTECTOMY     HAND SURGERY     9 subsequent surgeries   REVERSE SHOULDER ARTHROPLASTY Left 01/27/2023   Procedure: REVERSE SHOULDER ARTHROPLASTY;  Surgeon: Ellard Gunning, MD;  Location: [facility];  Service: Orthopedics;  Laterality: Left;    rotator cuff surgery Right    Patient Active Problem List   Diagnosis Date Noted   Fatigue 07/18/2023   Snoring 05/23/2023   Preoperative clearance 01/19/2023   Callus 10/28/2022   Isolated memory impairment 07/23/2022   Lightheadedness 07/07/2022   Medication refill 05/11/2022   Pain of left eye 05/11/2022   Red eye 05/11/2022   Lung nodule seen on imaging study 04/26/2022   Lung nodule 04/26/2022   Abnormal chest x-ray 04/26/2022   DOE (dyspnea on exertion) 04/22/2022   Acute non-recurrent maxillary sinusitis 04/08/2022   High risk medication use 03/22/2022   Elevated hemoglobin (HCC) 03/22/2022   Former smoker 01/28/2022   Unexplained weight  loss 01/28/2022   Bilateral edema of lower extremity 10/09/2021   Pain, dental 10/09/2021   Preventative health care 10/09/2021   Tremor 09/18/2021   Left hip pain 09/18/2021   Anxiety and depression 08/26/2021   Acute pain of left knee 08/26/2021   Grief 08/26/2021   Rotator cuff tear arthropathy 06/23/2021   Psychosexual dysfunction with inhibited sexual excitement 06/23/2021   Obesity 06/23/2021   Benign neoplasm of colon 06/23/2021   Carpal tunnel syndrome 06/23/2021   History of adenomatous polyp of colon 06/23/2021   Brachial neuritis 06/23/2021   Parkinson's disease (HCC) 06/23/2021   Dysuria 03/18/2021   Low testosterone in male 03/18/2021   Hypokalemia 01/06/2021   Pain in left shoulder 11/05/2020   Bilateral primary osteoarthritis of knee 12/19/2019   Unilateral primary osteoarthritis, right knee 10/30/2019   Insomnia 05/06/2015   Essential hypertension 05/06/2015   Chronic back pain 05/06/2015   Type 2 diabetes mellitus without complication (HCC) 05/06/2015   Stroke (HCC) 05/06/2015   HLD (hyperlipidemia) 05/06/2015   BPH (benign prostatic hyperplasia) 05/06/2015   Esophageal reflux 05/06/2015   History of TB (tuberculosis) 05/06/2015   Iron deficiency 04/10/2015    PCP: Dorothe Gaster, NP  REFERRING PROVIDER: Achille Ache, MD   REFERRING DIAG:  [phone] (ICD-10-CM) - Presence of right artificial  shoulder joint  L SHOULDER PAIN S/P L RTSA   THERAPY DIAG:  Chronic left shoulder pain  Stiffness of left shoulder, not elsewhere classified  Muscle weakness (generalized)  Rationale for Evaluation and Treatment: Rehabilitation  ONSET DATE: January 27, 2023 S/P rTSA  SUBJECTIVE:                                                                                                                                                                                      SUBJECTIVE STATEMENT: He relays shoulder is feeling about the same. Feels like it is stuck where it  is.   PERTINENT HISTORY: S/P rTSA 01/27/23, Chronic LBP, HTN, HLD, CVA (date unknown), DM2, Pagets disease of bone, action tremor  PAIN:  NPRS scale:  4/10 Pain location: L shoulder Pain description: "tooth ache pain" Aggravating factors: reaching Relieving factors: ice, rest  PRECAUTIONS: None  WEIGHT BEARING RESTRICTIONS: No  FALLS:  Has patient fallen in last 6 months? No  LIVING ENVIRONMENT: Lives with: lives alone Lives in: House/apartment Stairs:  NA Has following equipment at home:  NA  OCCUPATION: Retired- [employer]  PLOF: Independent  PATIENT GOALS: play pickle ball again  Next MD visit: none scheduled  OBJECTIVE:   DIAGNOSTIC FINDINGS:  None on file from post surgery  PATIENT SURVEYS:  Patient-Specific Activity Scoring Scheme  "0" represents "unable to perform." "10" represents "able to perform at prior level. 0 1 2 3 4 5 6 7 8 9  10 (Date and Score)   Activity Eval     1. Lifting above head 2    2. Reaching arm in front of him  0    3. Close doors 4   4.    5.    Score 6/30    Total score = sum of the activity scores/number of activities Minimum detectable change (90%CI) for average score = 2 points Minimum detectable change (90%CI) for single activity score = 3 points  COGNITION: Overall cognitive status: WFL     SENSATION: Light touch: Impaired C3-C7 on Lt side with patient reporting 70% sensation compared to Rt side.    POSTURE: L shoulder rounded forward more than R, however both are rounded.   UPPER EXTREMITY ROM:   ROM Right eval Left eval Left 11/15/23 Left 11/24/23  Shoulder flexion 120 P: 90 A: 10 P:110 P:112  Shoulder extension [facility] WFL    Shoulder abduction 160 65 P:90 P:90  Shoulder adduction      Shoulder internal rotation Touches stomach Touches stomach    Shoulder external rotation 40 P: 4 A: 2 P:40 P:45  Elbow flexion [facility] WFL    Elbow extension  [facility] WFL    Wrist flexion      Wrist extension       Wrist ulnar deviation      Wrist radial deviation      Wrist pronation      Wrist supination      (Blank rows = not tested)  UPPER EXTREMITY MMT:  MMT Right eval Left eval  Shoulder flexion 5 2  Shoulder extension  3  Shoulder abduction 4 2  Shoulder adduction    Shoulder internal rotation 5 3  Shoulder external rotation 5 3  Middle trapezius    Lower trapezius    Elbow flexion 4 4-  Elbow extension 4 4-  Wrist flexion    Wrist extension    Wrist ulnar deviation  3+  Wrist radial deviation  3+  Wrist pronation  4  Wrist supination  4  Grip strength (lbs)  Did not test with dynamometer;MMT 3+  (Blank rows = not tested)  SHOULDER SPECIAL TESTS: Not tested  JOINT MOBILITY TESTING:  Inferior glide- very stiff and end ROM A-P and P-A: normal at mid ROM  PALPATION:  Non-tender throughout                                                                                                                                                                                                 TREATMENT:                                                                                                        DATE: 11/24/2023 Theractivity :(ROM and or strength for reaching) UBE L2 3 min forward, 3 min backward seat #8 Pulleys 2 min flexion, 2 min abduction UE ranger X 10 flexion, circles X 15 CW and CCW Lifting left arm into first shelf of top cabinet, 2# 2X10 Wall walks rolling pball up wall X 10.  Wall ladder X 10 flexion to #20 at best Therex Supine wand abduction AAROM 1#  bar 5 sec X 10 Supine wand ER 1# bar 5 sec hold X 10 Supine shoulder flexion AROM with 1# X 10  Manual therapy for skilled palpation and active compression with Trigger Point  Dry Needling  Initial Treatment: Pt instructed on Dry Needling rational, procedures, and possible side effects. Pt instructed to expect mild to moderate muscle soreness later in the day and/or into the next day.  Pt instructed to continue  prescribed HEP. Patient was educated on signs and symptoms of infection and other risk factors and advised to seek medical attention should they occur.  Patient verbalized understanding of these instructions and education.  Patient Verbal Consent Given: Yes Education Handout Provided: Yes Muscles Treated: left deltoids, Infraspinatus and supraspinatus Electrical Stimulation Performed: No  Treatment Response/Outcome:  good overall tolerance,twitch response noted        DATE: 11/22/2023 Theractivity :(ROM and or strength for reaching) UBE L2 3 min forward, 3 min backward seat #8 Pulleys 2 min flexion, 2 min abduction UE ranger X 10 flexion, circles X 15 CW and CCW Lifting left arm into first shelf of top cabinet, 2# 2X10 Wall walks rolling pball up wall X 10.  Wall ladder X 10 flexion to #20 at best Therex Supine  shoulder flexion 1# X 10 AROM Supine wand abduction AAROM 1#  bar 5 sec X 10 Supine wand ER 1# bar 5 sec hold X 10 Supine shoulder flexion AROM with 1# X 10   DATE: 11/17/2023 Theractivity :(ROM and or strength for reaching) UBE L2 3 min forward, 3 min backward seat #8 Pulleys 2 min flexion, 2 min abduction UE ranger X 10 flexion, circles X 15 CW and CCW Lifting left arm into first shelf of top cabinet, 1# 2X10 Wall walks rolling pball up wall.  Wall ladder X 10 flexion to #20 at best Supine wand chest press into  shoulder flexion AAROM 2# bar 5 sec hold X 15 Supine wand abduction AAROM 2# X 15 Supine wand ER 2# bar 2X15 Supine shoulder flexion AROM with 1# X 10  Manual: Left shoulder PROM with overpressure within pain tolerance flexion, abd, ER planes, GH mobs grade 1-3 for inferior glides, and distraction glides.       PATIENT EDUCATION: Education details: HEP, POC, pain and how hard to push with his exercises Person educated: Patient Education method: Explanation, Demonstration, Verbal cues, and Handouts Education comprehension: verbalized understanding,  returned demonstration, and verbal cues required  HOME EXERCISE PROGRAM: Access Code: [email or web address] URL: [email or web address] Date: 10/27/2023 Prepared by: Ivery Quale  Exercises - Supine Shoulder Flexion Extension AAROM with Dowel  - 3-5 x daily - 7 x weekly - 1 sets - 10 reps - 5 sec hold - Supine Shoulder External Rotation in 45 Degrees Abduction AAROM with Dowel  - 3-5 x daily - 7 x weekly - 1 sets - 10 reps - 5 sec hold - Circular Shoulder Pendulum with Table Support  - 3-5 x daily - 7 x weekly - 1 sets - 15 reps - Seated Shoulder Abduction Towel Slide at Table Top  - 3-5 x daily - 6 x weekly - 1 sets - 10 reps - 5 sec hold - Seated Shoulder External Rotation PROM on Table  - 3-5 x daily - 6 x weekly - 1 sets - 10 reps - 5 hold  ASSESSMENT:  CLINICAL IMPRESSION: Progress has slowed over last 2 weeks. Updated measurements do show minimal improvements in ROM but his pain has not improved lately. I did try DN today to see if this will help with any of the pain and tightness in his shoulder.   OBJECTIVE IMPAIRMENTS: decreased activity tolerance, decreased knowledge of condition, decreased mobility, decreased ROM, decreased strength,  hypomobility, increased edema, increased fascial restrictions, impaired perceived functional ability, increased muscle spasms, impaired sensation, impaired UE functional use, and pain.   ACTIVITY LIMITATIONS: carrying, lifting, bending, sitting, sleeping, bathing, dressing, reach over head, and hygiene/grooming  PARTICIPATION LIMITATIONS: meal prep, cleaning, laundry, driving, shopping, community activity, and yard work  PERSONAL FACTORS: Age, Past/current experiences, Time since onset of injury/illness/exacerbation, and 3+ comorbidities: see above  are also affecting patient's functional outcome.   REHAB POTENTIAL: Fair Shoulder has been stiff since June 2024 with patient also fearful of movement due to pain.  CLINICAL DECISION MAKING:  Evolving/moderate complexity  EVALUATION COMPLEXITY: Moderate   GOALS: Goals reviewed with patient? Yes  SHORT TERM GOALS: (target date for Short term goals are 3 weeks 11/16/2023)  1.Patient will demonstrate independent use of home exercise program to maintain progress from in clinic treatments. Goal status: MET 11/15/23  LONG TERM GOALS: (target dates for all long term goals are 8 weeks  12/21/2023 )   1. Patient will demonstrate/report pain at worst less than or equal to 2/10  at rest and less than 5/10 with reaching  Goal status: Ongoing  11/08/2023   2. Patient will demonstrate independent use of home exercise program to facilitate ability to maintain/progress functional gains from skilled physical therapy services. Goal status: Ongoing  11/08/2023   3. Patient will demonstrate Patient specific functional scale avg > or = 15/30 to indicate reduced disability due to condition.  Goal status: Ongoing  11/08/2023   4.  Patient will demonstrate LUE MMT 4/5 throughout to facilitate lifting, reaching, carrying at [facility] in daily activity.   Goal status: Ongoing  11/08/2023   5.  Patient will demonstrate Left GH joint AROM WFL s symptoms to facilitate usual overhead reaching, self care, dressing at PLOF.    Goal status: Ongoing  11/08/2023   6.  Patient will demonstrate LUE grip strength </= 10lb different than RUE  Goal status: Ongoing  11/08/2023    PLAN:  PT FREQUENCY: 1-3x/week  PT DURATION: 8 weeks  PLANNED INTERVENTIONS: Can include 09811- PT Re-evaluation, 97110-Therapeutic exercises, 97530- Therapeutic activity, 97112- Neuromuscular re-education, 97535- Self Care, 97140- Manual therapy, [phone]- Gait training, [phone]- Orthotic Fit/training, [phone]- Canalith repositioning, [id number]- Aquatic Therapy, [phone]- Electrical stimulation (unattended), [phone]- Electrical stimulation (manual), [id number] Physical performance testing, 97016- Vasopneumatic device, [id number]- Ultrasound, [id number]- Traction (mechanical),  [id number]- Ionotophoresis 4mg /ml Dexamethasone, Patient/Family education, Balance training, Stair training, Taping, Dry Needling, Joint mobilization, Joint manipulation, Spinal manipulation, Spinal mobilization, Scar mobilization, Vestibular training, Visual/preceptual remediation/compensation, DME instructions, Cryotherapy, and Moist heat.  All performed as medically necessary.  All included unless contraindicated  PLAN FOR NEXT SESSION:    How was DN strength and ROM geared towards ER, flexion, abd.     Mick Alamin, PT, DPT 11/24/2023, 1:56 PM

## 2023-11-29 ENCOUNTER — Ambulatory Visit (INDEPENDENT_AMBULATORY_CARE_PROVIDER_SITE_OTHER): Admitting: Physical Therapy

## 2023-11-29 ENCOUNTER — Encounter: Payer: Self-pay | Admitting: Physical Therapy

## 2023-11-29 DIAGNOSIS — M25612 Stiffness of left shoulder, not elsewhere classified: Secondary | ICD-10-CM | POA: Diagnosis not present

## 2023-11-29 DIAGNOSIS — M6281 Muscle weakness (generalized): Secondary | ICD-10-CM | POA: Diagnosis not present

## 2023-11-29 DIAGNOSIS — M25512 Pain in left shoulder: Secondary | ICD-10-CM

## 2023-11-29 DIAGNOSIS — G8929 Other chronic pain: Secondary | ICD-10-CM

## 2023-11-29 NOTE — Therapy (Signed)
 OUTPATIENT PHYSICAL THERAPY UPPER EXTREMITYTREATMENT   Patient Name: James Pearson MRN: [id number] DOB:September 20, 1945, 78 y.o., male Today's Date: 11/29/2023  END OF SESSION:  PT End of Session - 11/29/23 1357     Visit Number 8    Number of Visits 15    Authorization Type [facility]    Authorization Time Period 09/21/2023-01/19/2024    Authorization - Visit Number 8    Authorization - Number of Visits 15    Progress Note Due on Visit 10    PT Start Time 1345    PT Stop Time 1430    PT Time Calculation (min) 45 min    Activity Tolerance Patient tolerated treatment well    Behavior During Therapy WFL for tasks assessed/performed                Past Medical History:  Diagnosis Date   Arthritis    Diabetes mellitus without complication (HCC)    GERD (gastroesophageal reflux disease)    Hepatitis    Hypertension    Parkinson's disease (HCC)    Positive TB test    as a child   Stroke (HCC) 08/10/2011   Past Surgical History:  Procedure Laterality Date   CHOLECYSTECTOMY     HAND SURGERY     9 subsequent surgeries   REVERSE SHOULDER ARTHROPLASTY Left 01/27/2023   Procedure: REVERSE SHOULDER ARTHROPLASTY;  Surgeon: Ellard Gunning, MD;  Location: [facility];  Service: Orthopedics;  Laterality: Left;    rotator cuff surgery Right    Patient Active Problem List   Diagnosis Date Noted   Fatigue 07/18/2023   Snoring 05/23/2023   Preoperative clearance 01/19/2023   Callus 10/28/2022   Isolated memory impairment 07/23/2022   Lightheadedness 07/07/2022   Medication refill 05/11/2022   Pain of left eye 05/11/2022   Red eye 05/11/2022   Lung nodule seen on imaging study 04/26/2022   Lung nodule 04/26/2022   Abnormal chest x-ray 04/26/2022   DOE (dyspnea on exertion) 04/22/2022   Acute non-recurrent maxillary sinusitis 04/08/2022   High risk medication use 03/22/2022   Elevated hemoglobin (HCC) 03/22/2022   Former smoker 01/28/2022   Unexplained weight  loss 01/28/2022   Bilateral edema of lower extremity 10/09/2021   Pain, dental 10/09/2021   Preventative health care 10/09/2021   Tremor 09/18/2021   Left hip pain 09/18/2021   Anxiety and depression 08/26/2021   Acute pain of left knee 08/26/2021   Grief 08/26/2021   Rotator cuff tear arthropathy 06/23/2021   Psychosexual dysfunction with inhibited sexual excitement 06/23/2021   Obesity 06/23/2021   Benign neoplasm of colon 06/23/2021   Carpal tunnel syndrome 06/23/2021   History of adenomatous polyp of colon 06/23/2021   Brachial neuritis 06/23/2021   Parkinson's disease (HCC) 06/23/2021   Dysuria 03/18/2021   Low testosterone in male 03/18/2021   Hypokalemia 01/06/2021   Pain in left shoulder 11/05/2020   Bilateral primary osteoarthritis of knee 12/19/2019   Unilateral primary osteoarthritis, right knee 10/30/2019   Insomnia 05/06/2015   Essential hypertension 05/06/2015   Chronic back pain 05/06/2015   Type 2 diabetes mellitus without complication (HCC) 05/06/2015   Stroke (HCC) 05/06/2015   HLD (hyperlipidemia) 05/06/2015   BPH (benign prostatic hyperplasia) 05/06/2015   Esophageal reflux 05/06/2015   History of TB (tuberculosis) 05/06/2015   Iron  deficiency 04/10/2015    PCP: Dorothe Gaster, NP  REFERRING PROVIDER: Achille Ache, MD   REFERRING DIAG:  [phone] (ICD-10-CM) - Presence of right artificial  shoulder joint  L SHOULDER PAIN S/P L RTSA   THERAPY DIAG:  Chronic left shoulder pain  Stiffness of left shoulder, not elsewhere classified  Muscle weakness (generalized)  Rationale for Evaluation and Treatment: Rehabilitation  ONSET DATE: January 27, 2023 S/P rTSA  SUBJECTIVE:                                                                                                                                                                                      SUBJECTIVE STATEMENT: He relays shoulder is not much better after DN, he was sore all weekend but it  has wore off now.   PERTINENT HISTORY: S/P rTSA 01/27/23, Chronic LBP, HTN, HLD, CVA (date unknown), DM2, Pagets disease of bone, action tremor  PAIN:  NPRS scale:  4/10 Pain location: L shoulder Pain description: "tooth ache pain" Aggravating factors: reaching Relieving factors: ice, rest  PRECAUTIONS: None  WEIGHT BEARING RESTRICTIONS: No  FALLS:  Has patient fallen in last 6 months? No  LIVING ENVIRONMENT: Lives with: lives alone Lives in: House/apartment Stairs:  NA Has following equipment at home:  NA  OCCUPATION: Retired- [employer]  PLOF: Independent  PATIENT GOALS: play pickle ball again  Next MD visit: none scheduled  OBJECTIVE:   DIAGNOSTIC FINDINGS:  None on file from post surgery  PATIENT SURVEYS:  Patient-Specific Activity Scoring Scheme  "0" represents "unable to perform." "10" represents "able to perform at prior level. 0 1 2 3 4 5 6 7 8 9  10 (Date and Score)   Activity Eval     1. Lifting above head 2    2. Reaching arm in front of him  0    3. Close doors 4   4.    5.    Score 6/30    Total score = sum of the activity scores/number of activities Minimum detectable change (90%CI) for average score = 2 points Minimum detectable change (90%CI) for single activity score = 3 points  COGNITION: Overall cognitive status: WFL     SENSATION: Light touch: Impaired C3-C7 on Lt side with patient reporting 70% sensation compared to Rt side.    POSTURE: L shoulder rounded forward more than R, however both are rounded.   UPPER EXTREMITY ROM:   ROM Right eval Left eval Left 11/15/23 Left 11/24/23  Shoulder flexion 120 P: 90 A: 10 P:110 P:112  Shoulder extension [facility] WFL    Shoulder abduction 160 65 P:90 P:90  Shoulder adduction      Shoulder internal rotation Touches stomach Touches stomach    Shoulder external rotation 40 P: 4 A: 2 P:40 P:45  Elbow flexion [facility]  Elbow extension [facility]    Wrist flexion      Wrist  extension      Wrist ulnar deviation      Wrist radial deviation      Wrist pronation      Wrist supination      (Blank rows = not tested)  UPPER EXTREMITY MMT:  MMT Right eval Left eval  Shoulder flexion 5 2  Shoulder extension  3  Shoulder abduction 4 2  Shoulder adduction    Shoulder internal rotation 5 3  Shoulder external rotation 5 3  Middle trapezius    Lower trapezius    Elbow flexion 4 4-  Elbow extension 4 4-  Wrist flexion    Wrist extension    Wrist ulnar deviation  3+  Wrist radial deviation  3+  Wrist pronation  4  Wrist supination  4  Grip strength (lbs)  Did not test with dynamometer;MMT 3+  (Blank rows = not tested)  SHOULDER SPECIAL TESTS: Not tested  JOINT MOBILITY TESTING:  Inferior glide- very stiff and end ROM A-P and P-A: normal at mid ROM  PALPATION:  Non-tender throughout                                                                                                                                                                                                 TREATMENT:                                                                                                        DATE: 11/29/2023 Theractivity :(ROM and or strength for reaching) UBE L2 3 min forward, 3 min backward seat #8 UE ranger X 10 flexion, circles X 15 CW and CCW Lifting left arm into first shelf of top cabinet, 2# 2X10 Standing rows green 2X10 Standing shoulder extensions red 2X10 Seated table stretch ER 5 sec 2X10 Seated table stretch flexion 5 sec 2X10  IFC electrical stimulation X 10 min to left shoulder lateral-posterior, with moist heat   DATE: 11/24/2023 Theractivity :(ROM and or strength for reaching) UBE L2 3 min forward, 3 min backward seat #8 Pulleys 2 min flexion, 2 min abduction  UE ranger X 10 flexion, circles X 15 CW and CCW Lifting left arm into first shelf of top cabinet, 2# 2X10 Wall walks rolling pball up wall X 10.  Wall ladder X 10 flexion to #20  at best Therex Supine wand abduction AAROM 1#  bar 5 sec X 10 Supine wand ER 1# bar 5 sec hold X 10 Supine shoulder flexion AROM with 1# X 10  Manual therapy for skilled palpation and active compression with Trigger Point Dry Needling  Initial Treatment: Pt instructed on Dry Needling rational, procedures, and possible side effects. Pt instructed to expect mild to moderate muscle soreness later in the day and/or into the next day.  Pt instructed to continue prescribed HEP. Patient was educated on signs and symptoms of infection and other risk factors and advised to seek medical attention should they occur.  Patient verbalized understanding of these instructions and education.  Patient Verbal Consent Given: Yes Education Handout Provided: Yes Muscles Treated: left deltoids, Infraspinatus and supraspinatus Electrical Stimulation Performed: No  Treatment Response/Outcome:  good overall tolerance,twitch response noted        DATE: 11/22/2023 Theractivity :(ROM and or strength for reaching) UBE L2 3 min forward, 3 min backward seat #8 Pulleys 2 min flexion, 2 min abduction UE ranger X 10 flexion, circles X 15 CW and CCW Lifting left arm into first shelf of top cabinet, 2# 2X10 Wall walks rolling pball up wall X 10.  Wall ladder X 10 flexion to #20 at best Therex Supine  shoulder flexion 1# X 10 AROM Supine wand abduction AAROM 1#  bar 5 sec X 10 Supine wand ER 1# bar 5 sec hold X 10 Supine shoulder flexion AROM with 1# X 10   DATE: 11/17/2023 Theractivity :(ROM and or strength for reaching) UBE L2 3 min forward, 3 min backward seat #8 Pulleys 2 min flexion, 2 min abduction UE ranger X 10 flexion, circles X 15 CW and CCW Lifting left arm into first shelf of top cabinet, 1# 2X10 Wall walks rolling pball up wall.  Wall ladder X 10 flexion to #20 at best Supine wand chest press into  shoulder flexion AAROM 2# bar 5 sec hold X 15 Supine wand abduction AAROM 2# X 15 Supine wand ER  2# bar 2X15 Supine shoulder flexion AROM with 1# X 10  Manual: Left shoulder PROM with overpressure within pain tolerance flexion, abd, ER planes, GH mobs grade 1-3 for inferior glides, and distraction glides.       PATIENT EDUCATION: Education details: HEP, POC, pain and how hard to push with his exercises Person educated: Patient Education method: Explanation, Demonstration, Verbal cues, and Handouts Education comprehension: verbalized understanding, returned demonstration, and verbal cues required  HOME EXERCISE PROGRAM: Access Code: [email or web address] URL: [email or web address] Date: 11/29/2023 Prepared by: Jamee Mazzoni  Exercises - Supine Shoulder Flexion Extension AAROM with Dowel  - 3-5 x daily - 7 x weekly - 1 sets - 10 reps - 5 sec hold - Supine Shoulder External Rotation in 45 Degrees Abduction AAROM with Dowel  - 3-5 x daily - 7 x weekly - 1 sets - 10 reps - 5 sec hold - Circular Shoulder Pendulum with Table Support  - 3-5 x daily - 7 x weekly - 1 sets - 15 reps - Seated Shoulder Abduction Towel Slide at Table Top  - 3-5 x daily - 6 x weekly - 1 sets - 10 reps - 5 sec hold - Seated Shoulder External Rotation PROM on Table  -  3-5 x daily - 6 x weekly - 1 sets - 10 reps - 5 hold - Standing Shoulder Row with Anchored Resistance  - 2 x daily - 6 x weekly - 2 sets - 10 reps - Shoulder extension with resistance - Neutral  - 2 x daily - 6 x weekly - 2 sets - 10 reps  ASSESSMENT:  CLINICAL IMPRESSION: I did add in shoulder rows and extensions to work on strength and printed these out to also add to his HEP. He could not tell much benefit from DN so he elected to try electrical stimulation today instead to see if this helps with the pain any better.  OBJECTIVE IMPAIRMENTS: decreased activity tolerance, decreased knowledge of condition, decreased mobility, decreased ROM, decreased strength, hypomobility, increased edema, increased fascial restrictions, impaired perceived  functional ability, increased muscle spasms, impaired sensation, impaired UE functional use, and pain.   ACTIVITY LIMITATIONS: carrying, lifting, bending, sitting, sleeping, bathing, dressing, reach over head, and hygiene/grooming  PARTICIPATION LIMITATIONS: meal prep, cleaning, laundry, driving, shopping, community activity, and yard work  PERSONAL FACTORS: Age, Past/current experiences, Time since onset of injury/illness/exacerbation, and 3+ comorbidities: see above  are also affecting patient's functional outcome.   REHAB POTENTIAL: Fair Shoulder has been stiff since June 2024 with patient also fearful of movement due to pain.  CLINICAL DECISION MAKING: Evolving/moderate complexity  EVALUATION COMPLEXITY: Moderate   GOALS: Goals reviewed with patient? Yes  SHORT TERM GOALS: (target date for Short term goals are 3 weeks 11/16/2023)  1.Patient will demonstrate independent use of home exercise program to maintain progress from in clinic treatments. Goal status: MET 11/15/23  LONG TERM GOALS: (target dates for all long term goals are 8 weeks  12/21/2023 )   1. Patient will demonstrate/report pain at worst less than or equal to 2/10  at rest and less than 5/10 with reaching  Goal status: Ongoing  11/08/2023   2. Patient will demonstrate independent use of home exercise program to facilitate ability to maintain/progress functional gains from skilled physical therapy services. Goal status: Ongoing  11/08/2023   3. Patient will demonstrate Patient specific functional scale avg > or = 15/30 to indicate reduced disability due to condition.  Goal status: Ongoing  11/08/2023   4.  Patient will demonstrate LUE MMT 4/5 throughout to facilitate lifting, reaching, carrying at [facility] in daily activity.   Goal status: Ongoing  11/08/2023   5.  Patient will demonstrate Left GH joint AROM WFL s symptoms to facilitate usual overhead reaching, self care, dressing at PLOF.    Goal status: Ongoing  11/08/2023    6.  Patient will demonstrate LUE grip strength </= 10lb different than RUE  Goal status: Ongoing  11/08/2023    PLAN:  PT FREQUENCY: 1-3x/week  PT DURATION: 8 weeks  PLANNED INTERVENTIONS: Can include 45409- PT Re-evaluation, 97110-Therapeutic exercises, 97530- Therapeutic activity, 97112- Neuromuscular re-education, 97535- Self Care, 97140- Manual therapy, [phone]- Gait training, [phone]- Orthotic Fit/training, [phone]- Canalith repositioning, [id number]- Aquatic Therapy, [phone]- Electrical stimulation (unattended), [phone]- Electrical stimulation (manual), [id number] Physical performance testing, 97016- Vasopneumatic device, [id number]- Ultrasound, [id number]- Traction (mechanical), [id number]- Ionotophoresis 4mg /ml Dexamethasone , Patient/Family education, Balance training, Stair training, Taping, Dry Needling, Joint mobilization, Joint manipulation, Spinal manipulation, Spinal mobilization, Scar mobilization, Vestibular training, Visual/preceptual remediation/compensation, DME instructions, Cryotherapy, and Moist heat.  All performed as medically necessary.  All included unless contraindicated  PLAN FOR NEXT SESSION:    How was E stim with heat? strength and ROM geared towards ER, flexion, abd.  Mick Alamin, PT, DPT 11/29/2023, 2:23 PM

## 2023-12-01 ENCOUNTER — Ambulatory Visit (INDEPENDENT_AMBULATORY_CARE_PROVIDER_SITE_OTHER): Admitting: Physical Therapy

## 2023-12-01 ENCOUNTER — Encounter: Payer: Self-pay | Admitting: Physical Therapy

## 2023-12-01 DIAGNOSIS — G8929 Other chronic pain: Secondary | ICD-10-CM | POA: Diagnosis not present

## 2023-12-01 DIAGNOSIS — M25612 Stiffness of left shoulder, not elsewhere classified: Secondary | ICD-10-CM

## 2023-12-01 DIAGNOSIS — M25512 Pain in left shoulder: Secondary | ICD-10-CM

## 2023-12-01 DIAGNOSIS — M6281 Muscle weakness (generalized): Secondary | ICD-10-CM

## 2023-12-01 NOTE — Therapy (Signed)
 OUTPATIENT PHYSICAL THERAPY UPPER EXTREMITYTREATMENT   Patient Name: James Pearson MRN: [id number] DOB:02/04/1946, 78 y.o., male Today's Date: 12/01/2023  END OF SESSION:  PT End of Session - 12/01/23 1410     Visit Number 9    Number of Visits 15    Authorization Type [facility]    Authorization Time Period 09/21/2023-01/19/2024    Authorization - Visit Number 9    Authorization - Number of Visits 15    Progress Note Due on Visit 10    PT Start Time 1345    PT Stop Time 1430    PT Time Calculation (min) 45 min    Activity Tolerance Patient tolerated treatment well    Behavior During Therapy WFL for tasks assessed/performed                Past Medical History:  Diagnosis Date   Arthritis    Diabetes mellitus without complication (HCC)    GERD (gastroesophageal reflux disease)    Hepatitis    Hypertension    Parkinson's disease (HCC)    Positive TB test    as a child   Stroke (HCC) 08/10/2011   Past Surgical History:  Procedure Laterality Date   CHOLECYSTECTOMY     HAND SURGERY     9 subsequent surgeries   REVERSE SHOULDER ARTHROPLASTY Left 01/27/2023   Procedure: REVERSE SHOULDER ARTHROPLASTY;  Surgeon: Ellard Gunning, MD;  Location: [facility];  Service: Orthopedics;  Laterality: Left;    rotator cuff surgery Right    Patient Active Problem List   Diagnosis Date Noted   Fatigue 07/18/2023   Snoring 05/23/2023   Preoperative clearance 01/19/2023   Callus 10/28/2022   Isolated memory impairment 07/23/2022   Lightheadedness 07/07/2022   Medication refill 05/11/2022   Pain of left eye 05/11/2022   Red eye 05/11/2022   Lung nodule seen on imaging study 04/26/2022   Lung nodule 04/26/2022   Abnormal chest x-ray 04/26/2022   DOE (dyspnea on exertion) 04/22/2022   Acute non-recurrent maxillary sinusitis 04/08/2022   High risk medication use 03/22/2022   Elevated hemoglobin (HCC) 03/22/2022   Former smoker 01/28/2022   Unexplained weight  loss 01/28/2022   Bilateral edema of lower extremity 10/09/2021   Pain, dental 10/09/2021   Preventative health care 10/09/2021   Tremor 09/18/2021   Left hip pain 09/18/2021   Anxiety and depression 08/26/2021   Acute pain of left knee 08/26/2021   Grief 08/26/2021   Rotator cuff tear arthropathy 06/23/2021   Psychosexual dysfunction with inhibited sexual excitement 06/23/2021   Obesity 06/23/2021   Benign neoplasm of colon 06/23/2021   Carpal tunnel syndrome 06/23/2021   History of adenomatous polyp of colon 06/23/2021   Brachial neuritis 06/23/2021   Parkinson's disease (HCC) 06/23/2021   Dysuria 03/18/2021   Low testosterone in male 03/18/2021   Hypokalemia 01/06/2021   Pain in left shoulder 11/05/2020   Bilateral primary osteoarthritis of knee 12/19/2019   Unilateral primary osteoarthritis, right knee 10/30/2019   Insomnia 05/06/2015   Essential hypertension 05/06/2015   Chronic back pain 05/06/2015   Type 2 diabetes mellitus without complication (HCC) 05/06/2015   Stroke (HCC) 05/06/2015   HLD (hyperlipidemia) 05/06/2015   BPH (benign prostatic hyperplasia) 05/06/2015   Esophageal reflux 05/06/2015   History of TB (tuberculosis) 05/06/2015   Iron  deficiency 04/10/2015    PCP: Dorothe Gaster, NP  REFERRING PROVIDER: Achille Ache, MD   REFERRING DIAG:  [phone] (ICD-10-CM) - Presence of right artificial  shoulder joint  L SHOULDER PAIN S/P L RTSA   THERAPY DIAG:  Chronic left shoulder pain  Stiffness of left shoulder, not elsewhere classified  Muscle weakness (generalized)  Rationale for Evaluation and Treatment: Rehabilitation  ONSET DATE: January 27, 2023 S/P rTSA  SUBJECTIVE:                                                                                                                                                                                      SUBJECTIVE STATEMENT: He relays 7/10 pain in shoulder today  PERTINENT HISTORY: S/P rTSA 01/27/23,  Chronic LBP, HTN, HLD, CVA (date unknown), DM2, Pagets disease of bone, action tremor  PAIN:  NPRS scale:  7/10 Pain location: L shoulder Pain description: "tooth ache pain" Aggravating factors: reaching Relieving factors: ice, rest  PRECAUTIONS: None  WEIGHT BEARING RESTRICTIONS: No  FALLS:  Has patient fallen in last 6 months? No  LIVING ENVIRONMENT: Lives with: lives alone Lives in: House/apartment Stairs:  NA Has following equipment at home:  NA  OCCUPATION: Retired- [employer]  PLOF: Independent  PATIENT GOALS: play pickle ball again  Next MD visit: none scheduled  OBJECTIVE:   DIAGNOSTIC FINDINGS:  None on file from post surgery  PATIENT SURVEYS:  Patient-Specific Activity Scoring Scheme  "0" represents "unable to perform." "10" represents "able to perform at prior level. 0 1 2 3 4 5 6 7 8 9  10 (Date and Score)   Activity Eval     1. Lifting above head 2    2. Reaching arm in front of him  0    3. Close doors 4   4.    5.    Score 6/30    Total score = sum of the activity scores/number of activities Minimum detectable change (90%CI) for average score = 2 points Minimum detectable change (90%CI) for single activity score = 3 points  COGNITION: Overall cognitive status: WFL     SENSATION: Light touch: Impaired C3-C7 on Lt side with patient reporting 70% sensation compared to Rt side.    POSTURE: L shoulder rounded forward more than R, however both are rounded.   UPPER EXTREMITY ROM:   ROM Right eval Left eval Left 11/15/23 Left 11/24/23  Shoulder flexion 120 P: 90 A: 10 P:110 P:112  Shoulder extension [facility] WFL    Shoulder abduction 160 65 P:90 P:90  Shoulder adduction      Shoulder internal rotation Touches stomach Touches stomach    Shoulder external rotation 40 P: 4 A: 2 P:40 P:45  Elbow flexion [facility] WFL    Elbow extension [facility] WFL    Wrist flexion  Wrist extension      Wrist ulnar deviation      Wrist radial  deviation      Wrist pronation      Wrist supination      (Blank rows = not tested)  UPPER EXTREMITY MMT:  MMT Right eval Left eval  Shoulder flexion 5 2  Shoulder extension  3  Shoulder abduction 4 2  Shoulder adduction    Shoulder internal rotation 5 3  Shoulder external rotation 5 3  Middle trapezius    Lower trapezius    Elbow flexion 4 4-  Elbow extension 4 4-  Wrist flexion    Wrist extension    Wrist ulnar deviation  3+  Wrist radial deviation  3+  Wrist pronation  4  Wrist supination  4  Grip strength (lbs)  Did not test with dynamometer;MMT 3+  (Blank rows = not tested)  SHOULDER SPECIAL TESTS: Not tested  JOINT MOBILITY TESTING:  Inferior glide- very stiff and end ROM A-P and P-A: normal at mid ROM  PALPATION:  Non-tender throughout                                                                                                                                                                                                 TREATMENT:                                                                                                        DATE: 11/29/2023 Theractivity :(ROM and or strength for reaching) UBE L2 3 min forward, 3 min backward seat #8 UE ranger X 10 flexion, circles X 15 CW and CCW Lifting left arm into first shelf of top cabinet, 2# 2X10 Standing pball roll up wall for flexion AAROM 5 sec X 10 Supine wand AAROM stretch ER 5 sec 2X10 Supine wand flexion AAROM stretch flexion 5 sec 2X10  Manual therapy: Lt shoulder PROM to tolerance with overpressure all planes and GH mobs grade 3-4 inferior glides, A-P glides, X 5 min total  Trigger Point Dry Needling (3 min not included in billable time) Subsequent Treatment: Instructions provided previously at initial dry needling treatment.  Patient Verbal  Consent Given: Yes Education Handout Provided: Previously Provided Muscles Treated: Left shoulder deltoids Electrical Stimulation Performed:  No Treatment Response/Outcome:  good overall tolerance,twitch response noted    Moist heat X 5 min to left shoulder after DN, not included in billable time     DATE: 11/24/2023 Theractivity :(ROM and or strength for reaching) UBE L2 3 min forward, 3 min backward seat #8 Pulleys 2 min flexion, 2 min abduction UE ranger X 10 flexion, circles X 15 CW and CCW Lifting left arm into first shelf of top cabinet, 2# 2X10 Wall walks rolling pball up wall X 10.  Wall ladder X 10 flexion to #20 at best Therex Supine wand abduction AAROM 1#  bar 5 sec X 10 Supine wand ER 1# bar 5 sec hold X 10 Supine shoulder flexion AROM with 1# X 10  Manual therapy for skilled palpation and active compression with Trigger Point Dry Needling  Initial Treatment: Pt instructed on Dry Needling rational, procedures, and possible side effects. Pt instructed to expect mild to moderate muscle soreness later in the day and/or into the next day.  Pt instructed to continue prescribed HEP. Patient was educated on signs and symptoms of infection and other risk factors and advised to seek medical attention should they occur.  Patient verbalized understanding of these instructions and education.  Patient Verbal Consent Given: Yes Education Handout Provided: Yes Muscles Treated: left deltoids, Infraspinatus and supraspinatus Electrical Stimulation Performed: No  Treatment Response/Outcome:  good overall tolerance,twitch response noted        DATE: 11/22/2023 Theractivity :(ROM and or strength for reaching) UBE L2 3 min forward, 3 min backward seat #8 Pulleys 2 min flexion, 2 min abduction UE ranger X 10 flexion, circles X 15 CW and CCW Lifting left arm into first shelf of top cabinet, 2# 2X10 Wall walks rolling pball up wall X 10.  Wall ladder X 10 flexion to #20 at best Therex Supine  shoulder flexion 1# X 10 AROM Supine wand abduction AAROM 1#  bar 5 sec X 10 Supine wand ER 1# bar 5 sec hold X 10 Supine  shoulder flexion AROM with 1# X 10   DATE: 11/17/2023 Theractivity :(ROM and or strength for reaching) UBE L2 3 min forward, 3 min backward seat #8 Pulleys 2 min flexion, 2 min abduction UE ranger X 10 flexion, circles X 15 CW and CCW Lifting left arm into first shelf of top cabinet, 1# 2X10 Wall walks rolling pball up wall.  Wall ladder X 10 flexion to #20 at best Supine wand chest press into  shoulder flexion AAROM 2# bar 5 sec hold X 15 Supine wand abduction AAROM 2# X 15 Supine wand ER 2# bar 2X15 Supine shoulder flexion AROM with 1# X 10  Manual: Left shoulder PROM with overpressure within pain tolerance flexion, abd, ER planes, GH mobs grade 1-3 for inferior glides, and distraction glides.       PATIENT EDUCATION: Education details: HEP, POC, pain and how hard to push with his exercises Person educated: Patient Education method: Explanation, Demonstration, Verbal cues, and Handouts Education comprehension: verbalized understanding, returned demonstration, and verbal cues required  HOME EXERCISE PROGRAM: Access Code: [email or web address] URL: [email or web address] Date: 11/29/2023 Prepared by: Jamee Mazzoni  Exercises - Supine Shoulder Flexion Extension AAROM with Dowel  - 3-5 x daily - 7 x weekly - 1 sets - 10 reps - 5 sec hold - Supine Shoulder External Rotation in 45 Degrees Abduction AAROM with Dowel  - 3-5 x daily -  7 x weekly - 1 sets - 10 reps - 5 sec hold - Circular Shoulder Pendulum with Table Support  - 3-5 x daily - 7 x weekly - 1 sets - 15 reps - Seated Shoulder Abduction Towel Slide at Table Top  - 3-5 x daily - 6 x weekly - 1 sets - 10 reps - 5 sec hold - Seated Shoulder External Rotation PROM on Table  - 3-5 x daily - 6 x weekly - 1 sets - 10 reps - 5 hold - Standing Shoulder Row with Anchored Resistance  - 2 x daily - 6 x weekly - 2 sets - 10 reps - Shoulder extension with resistance - Neutral  - 2 x daily - 6 x weekly - 2 sets - 10  reps  ASSESSMENT:  CLINICAL IMPRESSION:He continues to have shoulder pain and stiffness. I continue to encourage he [facility] to see if MUA is indicated. I did try DN again with him today.   OBJECTIVE IMPAIRMENTS: decreased activity tolerance, decreased knowledge of condition, decreased mobility, decreased ROM, decreased strength, hypomobility, increased edema, increased fascial restrictions, impaired perceived functional ability, increased muscle spasms, impaired sensation, impaired UE functional use, and pain.   ACTIVITY LIMITATIONS: carrying, lifting, bending, sitting, sleeping, bathing, dressing, reach over head, and hygiene/grooming  PARTICIPATION LIMITATIONS: meal prep, cleaning, laundry, driving, shopping, community activity, and yard work  PERSONAL FACTORS: Age, Past/current experiences, Time since onset of injury/illness/exacerbation, and 3+ comorbidities: see above  are also affecting patient's functional outcome.   REHAB POTENTIAL: Fair Shoulder has been stiff since June 2024 with patient also fearful of movement due to pain.  CLINICAL DECISION MAKING: Evolving/moderate complexity  EVALUATION COMPLEXITY: Moderate   GOALS: Goals reviewed with patient? Yes  SHORT TERM GOALS: (target date for Short term goals are 3 weeks 11/16/2023)  1.Patient will demonstrate independent use of home exercise program to maintain progress from in clinic treatments. Goal status: MET 11/15/23  LONG TERM GOALS: (target dates for all long term goals are 8 weeks  12/21/2023 )   1. Patient will demonstrate/report pain at worst less than or equal to 2/10  at rest and less than 5/10 with reaching  Goal status: Ongoing  11/08/2023   2. Patient will demonstrate independent use of home exercise program to facilitate ability to maintain/progress functional gains from skilled physical therapy services. Goal status: Ongoing  11/08/2023   3. Patient will demonstrate Patient specific  functional scale avg > or = 15/30 to indicate reduced disability due to condition.  Goal status: Ongoing  11/08/2023   4.  Patient will demonstrate LUE MMT 4/5 throughout to facilitate lifting, reaching, carrying at [facility] in daily activity.   Goal status: Ongoing  11/08/2023   5.  Patient will demonstrate Left GH joint AROM WFL s symptoms to facilitate usual overhead reaching, self care, dressing at PLOF.    Goal status: Ongoing  11/08/2023   6.  Patient will demonstrate LUE grip strength </= 10lb different than RUE  Goal status: Ongoing  11/08/2023    PLAN:  PT FREQUENCY: 1-3x/week  PT DURATION: 8 weeks  PLANNED INTERVENTIONS: Can include 96295- PT Re-evaluation, 97110-Therapeutic exercises, 97530- Therapeutic activity, 97112- Neuromuscular re-education, 97535- Self Care, 97140- Manual therapy, [phone]- Gait training, [phone]- Orthotic Fit/training, [phone]- Canalith repositioning, [id number]- Aquatic Therapy, [phone]- Electrical stimulation (unattended), [id number]- Electrical stimulation (manual), [id number] Physical performance testing, 97016- Vasopneumatic device, [id number]- Ultrasound, [id number]- Traction (mechanical), [id number]- Ionotophoresis 4mg /ml Dexamethasone , Patient/Family education, Balance training, Stair  training, Taping, Dry Needling, Joint mobilization, Joint manipulation, Spinal manipulation, Spinal mobilization, Scar mobilization, Vestibular training, Visual/preceptual remediation/compensation, DME instructions, Cryotherapy, and Moist heat.  All performed as medically necessary.  All included unless contraindicated  PLAN FOR NEXT SESSION:   how was DN?  strength and ROM geared towards ER, flexion, abd.     Mick Alamin, PT, DPT 12/01/2023, 2:10 PM

## 2023-12-06 ENCOUNTER — Ambulatory Visit (INDEPENDENT_AMBULATORY_CARE_PROVIDER_SITE_OTHER): Admitting: Physical Therapy

## 2023-12-06 ENCOUNTER — Encounter: Payer: Self-pay | Admitting: Physical Therapy

## 2023-12-06 ENCOUNTER — Other Ambulatory Visit: Payer: Self-pay | Admitting: Nurse Practitioner

## 2023-12-06 DIAGNOSIS — M25612 Stiffness of left shoulder, not elsewhere classified: Secondary | ICD-10-CM | POA: Diagnosis not present

## 2023-12-06 DIAGNOSIS — M25512 Pain in left shoulder: Secondary | ICD-10-CM | POA: Diagnosis not present

## 2023-12-06 DIAGNOSIS — G8929 Other chronic pain: Secondary | ICD-10-CM | POA: Diagnosis not present

## 2023-12-06 DIAGNOSIS — F320 Major depressive disorder, single episode, mild: Secondary | ICD-10-CM

## 2023-12-06 DIAGNOSIS — M6281 Muscle weakness (generalized): Secondary | ICD-10-CM | POA: Diagnosis not present

## 2023-12-06 NOTE — Telephone Encounter (Signed)
 Prescription Request  12/06/2023  LOV: 10/31/2023  What is the name of the medication or equipment? sertraline  (ZOLOFT ) 50 MG tablet   Have you contacted your pharmacy to request a refill? Yes   Which pharmacy would you like this sent to? [facility] [place], [place] - [address] [place] [address] [place] [address] Phone: [phone] Fax: [phone]   Patient notified that their request is being sent to the clinical staff for review and that they should receive a response within 2 business days.   Please advise at Mobile [phone] (mobile)

## 2023-12-06 NOTE — Progress Notes (Signed)
 [name] T. [name], MD, CAQ [facility] [address] [place], [address]  Phone: [phone]  FAX: [phone]  James Pearson - 78 y.o. male  MRN [id number]  Date of Birth: [birth year]  Date: 12/07/2023  PCP: Dorothe Gaster, NP  Referral: Dorothe Gaster, NP  Chief Complaint  Patient presents with   Knee Pain    Right   Shoulder Pain    Left   Subjective:   James Pearson is a 78 y.o. very pleasant male patient with Body mass index is 26.17 kg/m. who presents with the following:  Patient presents with follow-up right-sided knee osteoarthritis.  He also has chronic shoulder pain with a poor outcome from reverse shoulder arthroplasty on the left done by Dr. Alfredo Pearson.  I saw him about 3 months ago with some significant right-sided knee pain, osteoarthritis, and I did do an intra-articular injection which provided him very significant relief of symptoms.  He has had a resurgence of the symptoms, again.  Chronic left shoulder pain, status post reverse shoulder arthroplasty on the left.  Review of Systems is noted in the HPI, as appropriate  Objective:   BP 110/62 (BP Location: Right Arm, Patient Position: Sitting, Cuff Size: Normal)   Pulse 71   Temp 99.2 F (37.3 C) (Temporal)   Ht 5\' 8"  (1.727 m)   Wt 172 lb 2 oz (78.1 kg)   SpO2 96%   BMI 26.17 kg/m   GEN: No acute distress; alert,appropriate. PULM: Breathing comfortably in no respiratory distress PSYCH: Normally interactive.   Right knee: Mild effusion, pain with loading the medial lateral patellar facets Medial lateral joint line tenderness He lacks 3 degrees of extension and flexions at 97 degrees Stable to varus and valgus stress, and his ACL and PCL are intact No tenderness at the tibial plateau, fibular head, patellar tendon, or quad tendon.  Patient is unable to abduct his shoulder to 90 degrees or flex at 90 degrees. Strength is 3/5 Tenderness  at the [facility] joint, bicipital groove  Laboratory and Imaging Data:  Assessment and Plan:     ICD-10-CM   1. Primary osteoarthritis of right knee  M17.11     2. Chronic left shoulder pain  M25.512    G89.29     3. History of arthroplasty of left shoulder  [phone]      Think it is reasonable to inject the patient's right knee.  I think he is going to have a problem with his left shoulder off and on indefinitely.  He essentially has had a failed reverse shoulder arthroplasty done by Dr. Alfredo Pearson.  He asked me to inject his shoulder today, but the patient has had a joint replacement, and I do not feel comfortable doing this.  He is still doing physical therapy.  Aspiration/Injection Procedure Note James Pearson 03-11-1946 Date of procedure: 12/07/2023  Procedure: Large Joint Aspiration / Injection of Knee, R Indications: Pain  Procedure Details Patient verbally consented to procedure. Risks, benefits, and alternatives explained. Sterilely prepped with Chloraprep. Ethyl cholride used for anesthesia. 9 cc Lidocaine  1% mixed with 1 mL of Kenalog  40 mg injected using the anteromedial approach without difficulty. No complications with procedure and tolerated well. Patient had decreased pain post-injection. Medication: 1 mL of Kenalog  40 mg   Medication Management during today's office visit: No orders of the defined types were placed in this encounter.  Medications Discontinued During This Encounter  Medication Reason  BENFOTIAMINE PO Completed Course   Iron , Ferrous Sulfate , 325 (65 Fe) MG TABS Completed Course   ketoconazole  (NIZORAL ) 2 % shampoo Completed Course   clobetasol  (TEMOVATE ) 0.05 % external solution Completed Course    Orders placed today for conditions managed today: No orders of the defined types were placed in this encounter.   Disposition: No follow-ups on file.  [employer] One speech-to-text software was used for transcription in this dictation.  Possible  transcriptional errors can occur using Animal nutritionist.   Signed,  Ranny Bye. Chaitanya Amedee, MD   Outpatient Encounter Medications as of 12/07/2023  Medication Sig   amLODipine  (NORVASC ) 5 MG tablet TAKE 1 TABLET BY MOUTH DAILY   aspirin 81 MG chewable tablet Chew 81 mg by mouth in the morning.   atorvastatin  (LIPITOR) 80 MG tablet TAKE 1 TABLET BY MOUTH ONCE  DAILY   Berberine Chloride (BERBERINE HCI PO) Take 1,200 mg by mouth in the morning and at bedtime.   CALCIUM -VITAMIN D  PO Take by mouth. Calcium  600 mg and vitamin d  400 mg   cyclobenzaprine  (FLEXERIL ) 10 MG tablet Take 1 tablet (10 mg total) by mouth 3 (three) times daily as needed for muscle spasms.   docusate sodium (COLACE) 100 MG capsule Take 100 mg by mouth daily.   enalapril  (VASOTEC ) 20 MG tablet TAKE 2 TABLETS BY MOUTH ONCE  DAILY   Fluocinolone  Acetonide Body 0.01 % OIL Apply to scalp, face and ears QD to BID   hydrochlorothiazide  (HYDRODIURIL ) 12.5 MG tablet TAKE 1 TABLET BY MOUTH DAILY   Meclizine HCl 25 MG CHEW Chew 25 mg by mouth daily as needed.   metFORMIN  (GLUCOPHAGE -XR) 500 MG 24 hr tablet TAKE 1 TABLET BY MOUTH DAILY  WITH BREAKFAST   Multiple Vitamins-Minerals (MULTIVITAMIN WITH MINERALS) tablet Take 1 tablet by mouth daily. Centrum silver 50+   naloxone (NARCAN) nasal spray 4 mg/0.1 mL Place 1 spray into the nose once.   ondansetron  (ZOFRAN ) 4 MG tablet Take 1 tablet (4 mg total) by mouth every 8 (eight) hours as needed for nausea or vomiting.   oxyCODONE  (OXY IR/ROXICODONE ) 5 MG immediate release tablet Take 5-10 mg by mouth every 6 (six) hours as needed.   sertraline  (ZOLOFT ) 50 MG tablet TAKE 1 TABLET BY MOUTH EVERY DAY   sildenafil (VIAGRA) 100 MG tablet Take 50-100 mg by mouth daily as needed for erectile dysfunction.   Specialty Vitamins Products (PROSTATE PO) Take 250 mg by mouth at bedtime. supra beta prostate   tamsulosin  (FLOMAX ) 0.4 MG CAPS capsule TAKE 2 CAPSULES BY MOUTH DAILY   testosterone cypionate  (DEPOTESTOSTERONE CYPIONATE) 200 MG/ML injection Inject 1 mL into the muscle every 28 (twenty-eight) days.   tobramycin-dexamethasone  (TOBRADEX) ophthalmic solution Place 1 drop into the left eye daily as needed (eye infection).   traZODone  (DESYREL ) 50 MG tablet TAKE 1 TABLET BY MOUTH AT  BEDTIME AS NEEDED FOR SLEEP   TURMERIC PO Take 1 capsule by mouth daily as needed (Knee pain).   potassium chloride  SA (KLOR-CON  M) 20 MEQ tablet Take 2 tablets (40 mEq total) by mouth once for 1 dose.   [DISCONTINUED] BENFOTIAMINE PO Take 300 mg by mouth at bedtime. (Patient not taking: Reported on 10/31/2023)   [DISCONTINUED] clobetasol  (TEMOVATE ) 0.05 % external solution Apply 1 application topically 2 (two) times daily. Apply to affected areas on scalp until itchy rash clears/ PRN (Patient not taking: Reported on 10/31/2023)   [DISCONTINUED] Iron , Ferrous Sulfate , 325 (65 Fe) MG TABS Take 325 mg by  mouth daily. (Patient not taking: Reported on 10/31/2023)   [DISCONTINUED] ketoconazole  (NIZORAL ) 2 % shampoo APPLY THREE TIMES WEEKLY, LET SIT SEVERAL MINUTES BEFORE RINSING. (Patient not taking: Reported on 10/31/2023)   No facility-administered encounter medications on file as of 12/07/2023.

## 2023-12-06 NOTE — Therapy (Signed)
 OUTPATIENT PHYSICAL THERAPY UPPER EXTREMITYTREATMENT Progress Note reporting period 10/26/23 to 12/06/23  See below for objective and subjective measurements relating to patients progress with PT.   Patient Name: James Pearson MRN: [id number] DOB:May 21, 1946, 78 y.o., male Today's Date: 12/06/2023  END OF SESSION:  PT End of Session - 12/06/23 1353     Visit Number 10    Number of Visits 15    Authorization Type [facility]    Authorization Time Period 09/21/2023-01/19/2024    Authorization - Visit Number 10    Authorization - Number of Visits 15    Progress Note Due on Visit 20    PT Start Time 1347    PT Stop Time 1425    PT Time Calculation (min) 38 min    Activity Tolerance Patient tolerated treatment well    Behavior During Therapy WFL for tasks assessed/performed                Past Medical History:  Diagnosis Date   Arthritis    Diabetes mellitus without complication (HCC)    GERD (gastroesophageal reflux disease)    Hepatitis    Hypertension    Parkinson's disease (HCC)    Positive TB test    as a child   Stroke (HCC) 08/10/2011   Past Surgical History:  Procedure Laterality Date   CHOLECYSTECTOMY     HAND SURGERY     9 subsequent surgeries   REVERSE SHOULDER ARTHROPLASTY Left 01/27/2023   Procedure: REVERSE SHOULDER ARTHROPLASTY;  Surgeon: Ellard Gunning, MD;  Location: [facility];  Service: Orthopedics;  Laterality: Left;    rotator cuff surgery Right    Patient Active Problem List   Diagnosis Date Noted   Fatigue 07/18/2023   Snoring 05/23/2023   Preoperative clearance 01/19/2023   Callus 10/28/2022   Isolated memory impairment 07/23/2022   Lightheadedness 07/07/2022   Medication refill 05/11/2022   Pain of left eye 05/11/2022   Red eye 05/11/2022   Lung nodule seen on imaging study 04/26/2022   Lung nodule 04/26/2022   Abnormal chest x-ray 04/26/2022   DOE (dyspnea on exertion) 04/22/2022   Acute non-recurrent maxillary  sinusitis 04/08/2022   High risk medication use 03/22/2022   Elevated hemoglobin (HCC) 03/22/2022   Former smoker 01/28/2022   Unexplained weight loss 01/28/2022   Bilateral edema of lower extremity 10/09/2021   Pain, dental 10/09/2021   Preventative health care 10/09/2021   Tremor 09/18/2021   Left hip pain 09/18/2021   Anxiety and depression 08/26/2021   Acute pain of left knee 08/26/2021   Grief 08/26/2021   Rotator cuff tear arthropathy 06/23/2021   Psychosexual dysfunction with inhibited sexual excitement 06/23/2021   Obesity 06/23/2021   Benign neoplasm of colon 06/23/2021   Carpal tunnel syndrome 06/23/2021   History of adenomatous polyp of colon 06/23/2021   Brachial neuritis 06/23/2021   Parkinson's disease (HCC) 06/23/2021   Dysuria 03/18/2021   Low testosterone in male 03/18/2021   Hypokalemia 01/06/2021   Pain in left shoulder 11/05/2020   Bilateral primary osteoarthritis of knee 12/19/2019   Unilateral primary osteoarthritis, right knee 10/30/2019   Insomnia 05/06/2015   Essential hypertension 05/06/2015   Chronic back pain 05/06/2015   Type 2 diabetes mellitus without complication (HCC) 05/06/2015   Stroke (HCC) 05/06/2015   HLD (hyperlipidemia) 05/06/2015   BPH (benign prostatic hyperplasia) 05/06/2015   Esophageal reflux 05/06/2015   History of TB (tuberculosis) 05/06/2015   Iron  deficiency 04/10/2015    PCP: Winthrop Hawks  M, NP  REFERRING PROVIDER: Achille Ache, MD   REFERRING DIAG:  [phone] (ICD-10-CM) - Presence of right artificial shoulder joint  L SHOULDER PAIN S/P L RTSA   THERAPY DIAG:  Chronic left shoulder pain  Stiffness of left shoulder, not elsewhere classified  Muscle weakness (generalized)  Rationale for Evaluation and Treatment: Rehabilitation  ONSET DATE: January 27, 2023 S/P rTSA  SUBJECTIVE:                                                                                                                                                                                       SUBJECTIVE STATEMENT: He relays he has been using his TENS unit a lot at home and has had periods of no pain but does have pain when he uses his arm still  PERTINENT HISTORY: S/P rTSA 01/27/23, Chronic LBP, HTN, HLD, CVA (date unknown), DM2, Pagets disease of bone, action tremor  PAIN:  NPRS scale:  5/10 Pain location: L shoulder Pain description: "tooth ache pain" Aggravating factors: reaching Relieving factors: ice, rest  PRECAUTIONS: None  WEIGHT BEARING RESTRICTIONS: No  FALLS:  Has patient fallen in last 6 months? No  LIVING ENVIRONMENT: Lives with: lives alone Lives in: House/apartment Stairs:  NA Has following equipment at home:  NA  OCCUPATION: Retired- [employer]  PLOF: Independent  PATIENT GOALS: play pickle ball again  Next MD visit: none scheduled  OBJECTIVE:   DIAGNOSTIC FINDINGS:  None on file from post surgery  PATIENT SURVEYS:  Patient-Specific Activity Scoring Scheme  "0" represents "unable to perform." "10" represents "able to perform at prior level. 0 1 2 3 4 5 6 7 8 9  10 (Date and Score)   Activity Eval  12/06/23   1. Lifting above head 2 4   2. Reaching arm in front of him  0  4  3. Close doors 4 5  4.    5.    Score 6/30 9/30   Total score = sum of the activity scores/number of activities Minimum detectable change (90%CI) for average score = 2 points Minimum detectable change (90%CI) for single activity score = 3 points  COGNITION: Overall cognitive status: WFL     SENSATION: Light touch: Impaired C3-C7 on Lt side with patient reporting 70% sensation compared to Rt side.    POSTURE: L shoulder rounded forward more than R, however both are rounded.   UPPER EXTREMITY ROM:   ROM Right eval Left eval Left 11/15/23 Left 11/24/23   Shoulder flexion 120 P: 90 A: 10 P:110 P:112   Shoulder extension [facility] WFL     Shoulder abduction 160 65 P:90 P:90  Shoulder adduction        Shoulder internal rotation Touches stomach Touches stomach     Shoulder external rotation 40 P: 4 A: 2 P:40 P:45   Elbow flexion [facility] WFL     Elbow extension [facility]     Wrist flexion       Wrist extension       Wrist ulnar deviation       Wrist radial deviation       Wrist pronation       Wrist supination       (Blank rows = not tested)  UPPER EXTREMITY MMT:  MMT Right eval Left eval Left 12/06/23  Shoulder flexion 5 2 3+  Shoulder extension  3   Shoulder abduction 4 2 3+  Shoulder adduction     Shoulder internal rotation 5 3 3+  Shoulder external rotation 5 3 3+  Middle trapezius     Lower trapezius     Elbow flexion 4 4- 44  Elbow extension 4 4-   Wrist flexion     Wrist extension     Wrist ulnar deviation  3+   Wrist radial deviation  3+   Wrist pronation  4   Wrist supination  4   Grip strength (lbs)  Did not test with dynamometer;MMT 3+   (Blank rows = not tested)  SHOULDER SPECIAL TESTS: Not tested  JOINT MOBILITY TESTING:  Inferior glide- very stiff and end ROM A-P and P-A: normal at mid ROM  PALPATION:  Non-tender throughout                                                                                                                                                                                                 TREATMENT:                                                                                                        DATE: 12/06/2023 Theractivity :(ROM and or strength for reaching) UBE L2 3 min forward, 3 min backward seat #8 UE ranger X 10 flexion, circles X 15 CW and CCW Standing rows green 2X10 Standing shoulder extension green 2X10 Standing shoulder  IR green 2X10 Standing shoulder ER green 2X10 Lifting left arm into first shelf of top cabinet, 2# 2X10 Standing pball roll up wall for flexion AAROM 5 sec X 10 Supine wand AAROM stretch ER 5 sec 2X10 Supine wand flexion AAROM stretch flexion 5 sec 2X10   DATE: 11/24/2023 Theractivity  :(ROM and or strength for reaching) UBE L2 3 min forward, 3 min backward seat #8 Pulleys 2 min flexion, 2 min abduction UE ranger X 10 flexion, circles X 15 CW and CCW Lifting left arm into first shelf of top cabinet, 2# 2X10 Wall walks rolling pball up wall X 10.  Wall ladder X 10 flexion to #20 at best Therex Supine wand abduction AAROM 1#  bar 5 sec X 10 Supine wand ER 1# bar 5 sec hold X 10 Supine shoulder flexion AROM with 1# X 10  Manual therapy for skilled palpation and active compression with Trigger Point Dry Needling  Initial Treatment: Pt instructed on Dry Needling rational, procedures, and possible side effects. Pt instructed to expect mild to moderate muscle soreness later in the day and/or into the next day.  Pt instructed to continue prescribed HEP. Patient was educated on signs and symptoms of infection and other risk factors and advised to seek medical attention should they occur.  Patient verbalized understanding of these instructions and education.  Patient Verbal Consent Given: Yes Education Handout Provided: Yes Muscles Treated: left deltoids, Infraspinatus and supraspinatus Electrical Stimulation Performed: No  Treatment Response/Outcome:  good overall tolerance,twitch response noted        DATE: 11/22/2023 Theractivity :(ROM and or strength for reaching) UBE L2 3 min forward, 3 min backward seat #8 Pulleys 2 min flexion, 2 min abduction UE ranger X 10 flexion, circles X 15 CW and CCW Lifting left arm into first shelf of top cabinet, 2# 2X10 Wall walks rolling pball up wall X 10.  Wall ladder X 10 flexion to #20 at best Therex Supine  shoulder flexion 1# X 10 AROM Supine wand abduction AAROM 1#  bar 5 sec X 10 Supine wand ER 1# bar 5 sec hold X 10 Supine shoulder flexion AROM with 1# X 10   PATIENT EDUCATION: Education details: HEP, POC, pain and how hard to push with his exercises Person educated: Patient Education method: Explanation,  Demonstration, Verbal cues, and Handouts Education comprehension: verbalized understanding, returned demonstration, and verbal cues required  HOME EXERCISE PROGRAM: Access Code: [email or web address] URL: [email or web address] Date: 11/29/2023 Prepared by: Jamee Mazzoni  Exercises - Supine Shoulder Flexion Extension AAROM with Dowel  - 3-5 x daily - 7 x weekly - 1 sets - 10 reps - 5 sec hold - Supine Shoulder External Rotation in 45 Degrees Abduction AAROM with Dowel  - 3-5 x daily - 7 x weekly - 1 sets - 10 reps - 5 sec hold - Circular Shoulder Pendulum with Table Support  - 3-5 x daily - 7 x weekly - 1 sets - 15 reps - Seated Shoulder Abduction Towel Slide at Table Top  - 3-5 x daily - 6 x weekly - 1 sets - 10 reps - 5 sec hold - Seated Shoulder External Rotation PROM on Table  - 3-5 x daily - 6 x weekly - 1 sets - 10 reps - 5 hold - Standing Shoulder Row with Anchored Resistance  - 2 x daily - 6 x weekly - 2 sets - 10 reps - Shoulder extension with resistance - Neutral  - 2 x daily - 6 x weekly -  2 sets - 10 reps  ASSESSMENT:  CLINICAL IMPRESSION:10th visit progress note shows he is making some small slow progress but is still limited by frozen shoulder, ROM limitations, and strength limitations. We are working hard to improve this with PT. He relays he has been using home TENS unit which is actually helping with the pain some, he will get Cortizone injection tomorrow in his left shoulder and right knee so we will see if this helps as well.   OBJECTIVE IMPAIRMENTS: decreased activity tolerance, decreased knowledge of condition, decreased mobility, decreased ROM, decreased strength, hypomobility, increased edema, increased fascial restrictions, impaired perceived functional ability, increased muscle spasms, impaired sensation, impaired UE functional use, and pain.   ACTIVITY LIMITATIONS: carrying, lifting, bending, sitting, sleeping, bathing, dressing, reach over head, and  hygiene/grooming  PARTICIPATION LIMITATIONS: meal prep, cleaning, laundry, driving, shopping, community activity, and yard work  PERSONAL FACTORS: Age, Past/current experiences, Time since onset of injury/illness/exacerbation, and 3+ comorbidities: see above  are also affecting patient's functional outcome.   REHAB POTENTIAL: Fair Shoulder has been stiff since June 2024 with patient also fearful of movement due to pain.  CLINICAL DECISION MAKING: Evolving/moderate complexity  EVALUATION COMPLEXITY: Moderate   GOALS: Goals reviewed with patient? Yes  SHORT TERM GOALS: (target date for Short term goals are 3 weeks 11/16/2023)  1.Patient will demonstrate independent use of home exercise program to maintain progress from in clinic treatments. Goal status: MET 11/15/23  LONG TERM GOALS: (target dates for all long term goals are 8 weeks  12/21/2023 )   1. Patient will demonstrate/report pain at worst less than or equal to 2/10  at rest and less than 5/10 with reaching  Goal status: Ongoing  12/06/2023   2. Patient will demonstrate independent use of home exercise program to facilitate ability to maintain/progress functional gains from skilled physical therapy services. Goal status: Ongoing  12/06/2023   3. Patient will demonstrate Patient specific functional scale avg > or = 15/30 to indicate reduced disability due to condition.  Goal status: Ongoing  12/06/2023   4.  Patient will demonstrate LUE MMT 4/5 throughout to facilitate lifting, reaching, carrying at [facility] in daily activity.   Goal status: Ongoing  12/06/2023   5.  Patient will demonstrate Left GH joint AROM WFL s symptoms to facilitate usual overhead reaching, self care, dressing at PLOF.    Goal status: Ongoing  12/06/2023   6.  Patient will demonstrate LUE grip strength </= 10lb different than RUE  Goal status: Ongoing  12/06/2023    PLAN:  PT FREQUENCY: 1-3x/week  PT DURATION: 8 weeks  PLANNED INTERVENTIONS: Can include  40981- PT Re-evaluation, 97110-Therapeutic exercises, 97530- Therapeutic activity, 97112- Neuromuscular re-education, 97535- Self Care, 97140- Manual therapy, [phone]- Gait training, [phone]- Orthotic Fit/training, [phone]- Canalith repositioning, [id number]- Aquatic Therapy, [phone]- Electrical stimulation (unattended), [phone]- Electrical stimulation (manual), [id number] Physical performance testing, 97016- Vasopneumatic device, [id number]- Ultrasound, [id number]- Traction (mechanical), [id number]- Ionotophoresis 4mg /ml Dexamethasone , Patient/Family education, Balance training, Stair training, Taping, Dry Needling, Joint mobilization, Joint manipulation, Spinal manipulation, Spinal mobilization, Scar mobilization, Vestibular training, Visual/preceptual remediation/compensation, DME instructions, Cryotherapy, and Moist heat.  All performed as medically necessary.  All included unless contraindicated  PLAN FOR NEXT SESSION:   how is he doing after injection from MD?  strength and ROM geared towards ER, flexion, abd.     Mick Alamin, PT, DPT 12/06/2023, 2:28 PM

## 2023-12-06 NOTE — Telephone Encounter (Signed)
 LAST APPOINTMENT DATE: 10/31/23   NEXT APPOINTMENT DATE: 12/07/2023 with Dr. Geralyn Knee , 05/07/24 with [facility].    Zoloft  50MG   LAST REFILL: 11/14/23  QTY: #90 1RF

## 2023-12-07 ENCOUNTER — Ambulatory Visit (INDEPENDENT_AMBULATORY_CARE_PROVIDER_SITE_OTHER): Admitting: Family Medicine

## 2023-12-07 ENCOUNTER — Encounter: Payer: Self-pay | Admitting: Family Medicine

## 2023-12-07 VITALS — BP 110/62 | HR 71 | Temp 99.2°F | Ht 68.0 in | Wt 172.1 lb

## 2023-12-07 DIAGNOSIS — G8929 Other chronic pain: Secondary | ICD-10-CM

## 2023-12-07 DIAGNOSIS — M25512 Pain in left shoulder: Secondary | ICD-10-CM

## 2023-12-07 DIAGNOSIS — M1711 Unilateral primary osteoarthritis, right knee: Secondary | ICD-10-CM | POA: Diagnosis not present

## 2023-12-07 DIAGNOSIS — Z96612 Presence of left artificial shoulder joint: Secondary | ICD-10-CM | POA: Diagnosis not present

## 2023-12-07 MED ORDER — TRIAMCINOLONE ACETONIDE 40 MG/ML IJ SUSP
40.0000 mg | Freq: Once | INTRAMUSCULAR | Status: AC
  Administered 2023-12-07: 40 mg via INTRA_ARTICULAR

## 2023-12-07 NOTE — Addendum Note (Signed)
 Addended by: Franne Ivory on: 12/07/2023 02:59 PM   Modules accepted: Orders

## 2023-12-07 NOTE — Telephone Encounter (Signed)
 Patient should have a refill at the [facility] I refilled it on 11/2023

## 2023-12-07 NOTE — Telephone Encounter (Signed)
 Spoke to pt. He did not get the Rx at [facility] because he can get it through [facility] for free. Needs rx sent to [facility].

## 2023-12-08 ENCOUNTER — Encounter: Payer: Self-pay | Admitting: Physical Therapy

## 2023-12-08 ENCOUNTER — Ambulatory Visit: Admitting: Orthopaedic Surgery

## 2023-12-08 ENCOUNTER — Encounter: Payer: Self-pay | Admitting: Family Medicine

## 2023-12-08 ENCOUNTER — Ambulatory Visit: Admitting: Physical Therapy

## 2023-12-08 DIAGNOSIS — M6281 Muscle weakness (generalized): Secondary | ICD-10-CM | POA: Diagnosis not present

## 2023-12-08 DIAGNOSIS — M25512 Pain in left shoulder: Secondary | ICD-10-CM | POA: Diagnosis not present

## 2023-12-08 DIAGNOSIS — G8929 Other chronic pain: Secondary | ICD-10-CM | POA: Diagnosis not present

## 2023-12-08 DIAGNOSIS — M25612 Stiffness of left shoulder, not elsewhere classified: Secondary | ICD-10-CM | POA: Diagnosis not present

## 2023-12-08 MED ORDER — SERTRALINE HCL 50 MG PO TABS: 50.0000 mg | ORAL_TABLET | Freq: Every day | ORAL | 1 refills | Status: DC

## 2023-12-08 NOTE — Therapy (Signed)
 OUTPATIENT PHYSICAL THERAPY UPPER EXTREMITYTREATMENT  Patient Name: Shaqwan Hoak MRN: [id number] DOB:January 19, 1946, 78 y.o., male Today's Date: 12/08/2023  END OF SESSION:  PT End of Session - 12/08/23 1404     Visit Number 11    Number of Visits 15    Authorization Type [facility]    Authorization Time Period 09/21/2023-01/19/2024    Authorization - Visit Number 11    Authorization - Number of Visits 15    Progress Note Due on Visit 20    PT Start Time 1345    PT Stop Time 1423    PT Time Calculation (min) 38 min    Activity Tolerance Patient tolerated treatment well    Behavior During Therapy WFL for tasks assessed/performed                 Past Medical History:  Diagnosis Date   Arthritis    Diabetes mellitus without complication (HCC)    GERD (gastroesophageal reflux disease)    Hepatitis    Hypertension    Parkinson's disease (HCC)    Positive TB test    as a child   Stroke (HCC) 08/10/2011   Past Surgical History:  Procedure Laterality Date   CHOLECYSTECTOMY     HAND SURGERY     9 subsequent surgeries   REVERSE SHOULDER ARTHROPLASTY Left 01/27/2023   Procedure: REVERSE SHOULDER ARTHROPLASTY;  Surgeon: Ellard Gunning, MD;  Location: [facility];  Service: Orthopedics;  Laterality: Left;    rotator cuff surgery Right    Patient Active Problem List   Diagnosis Date Noted   Fatigue 07/18/2023   Snoring 05/23/2023   Preoperative clearance 01/19/2023   Callus 10/28/2022   Isolated memory impairment 07/23/2022   Lightheadedness 07/07/2022   Medication refill 05/11/2022   Pain of left eye 05/11/2022   Red eye 05/11/2022   Lung nodule seen on imaging study 04/26/2022   Lung nodule 04/26/2022   Abnormal chest x-ray 04/26/2022   DOE (dyspnea on exertion) 04/22/2022   Acute non-recurrent maxillary sinusitis 04/08/2022   High risk medication use 03/22/2022   Elevated hemoglobin (HCC) 03/22/2022   Former smoker 01/28/2022   Unexplained  weight loss 01/28/2022   Bilateral edema of lower extremity 10/09/2021   Pain, dental 10/09/2021   Preventative health care 10/09/2021   Tremor 09/18/2021   Left hip pain 09/18/2021   Anxiety and depression 08/26/2021   Acute pain of left knee 08/26/2021   Grief 08/26/2021   Rotator cuff tear arthropathy 06/23/2021   Psychosexual dysfunction with inhibited sexual excitement 06/23/2021   Obesity 06/23/2021   Benign neoplasm of colon 06/23/2021   Carpal tunnel syndrome 06/23/2021   History of adenomatous polyp of colon 06/23/2021   Brachial neuritis 06/23/2021   Parkinson's disease (HCC) 06/23/2021   Dysuria 03/18/2021   Low testosterone in male 03/18/2021   Hypokalemia 01/06/2021   Pain in left shoulder 11/05/2020   Bilateral primary osteoarthritis of knee 12/19/2019   Unilateral primary osteoarthritis, right knee 10/30/2019   Insomnia 05/06/2015   Essential hypertension 05/06/2015   Chronic back pain 05/06/2015   Type 2 diabetes mellitus without complication (HCC) 05/06/2015   Stroke (HCC) 05/06/2015   HLD (hyperlipidemia) 05/06/2015   BPH (benign prostatic hyperplasia) 05/06/2015   Esophageal reflux 05/06/2015   History of TB (tuberculosis) 05/06/2015   Iron  deficiency 04/10/2015    PCP: Dorothe Gaster, NP  REFERRING PROVIDER: Achille Ache, MD   REFERRING DIAG:  [phone] (ICD-10-CM) - Presence of right artificial  shoulder joint  L SHOULDER PAIN S/P L RTSA   THERAPY DIAG:  Chronic left shoulder pain  Stiffness of left shoulder, not elsewhere classified  Muscle weakness (generalized)  Left shoulder pain, unspecified chronicity  Rationale for Evaluation and Treatment: Rehabilitation  ONSET DATE: January 27, 2023 S/P rTSA  SUBJECTIVE:                                                                                                                                                                                      SUBJECTIVE STATEMENT: He relays he is managing  the pain with home TENS unit and with the exercises. Pain down to 3/10. He says he relays some transportation issues so may not be able to come back to PT for some time.   PERTINENT HISTORY: S/P rTSA 01/27/23, Chronic LBP, HTN, HLD, CVA (date unknown), DM2, Pagets disease of bone, action tremor  PAIN:  NPRS scale:  3/10 Pain location: L shoulder Pain description: "tooth ache pain" Aggravating factors: reaching Relieving factors: ice, rest  PRECAUTIONS: None  WEIGHT BEARING RESTRICTIONS: No  FALLS:  Has patient fallen in last 6 months? No  LIVING ENVIRONMENT: Lives with: lives alone Lives in: House/apartment Stairs:  NA Has following equipment at home:  NA  OCCUPATION: Retired- [employer]  PLOF: Independent  PATIENT GOALS: play pickle ball again  Next MD visit: none scheduled  OBJECTIVE:   DIAGNOSTIC FINDINGS:  None on file from post surgery  PATIENT SURVEYS:  Patient-Specific Activity Scoring Scheme  "0" represents "unable to perform." "10" represents "able to perform at prior level. 0 1 2 3 4 5 6 7 8 9  10 (Date and Score)   Activity Eval  12/06/23   1. Lifting above head 2 4   2. Reaching arm in front of him  0  4  3. Close doors 4 5  4.    5.    Score 6/30 9/30   Total score = sum of the activity scores/number of activities Minimum detectable change (90%CI) for average score = 2 points Minimum detectable change (90%CI) for single activity score = 3 points  COGNITION: Overall cognitive status: WFL     SENSATION: Light touch: Impaired C3-C7 on Lt side with patient reporting 70% sensation compared to Rt side.    POSTURE: L shoulder rounded forward more than R, however both are rounded.   UPPER EXTREMITY ROM:   ROM Right eval Left eval Left 11/15/23 Left 11/24/23   Shoulder flexion 120 P: 90 A: 10 P:110 P:112   Shoulder extension [facility] WFL     Shoulder abduction 160 65 P:90 P:90   Shoulder adduction  Shoulder internal rotation  Touches stomach Touches stomach     Shoulder external rotation 40 P: 4 A: 2 P:40 P:45   Elbow flexion [facility] WFL     Elbow extension [facility]     Wrist flexion       Wrist extension       Wrist ulnar deviation       Wrist radial deviation       Wrist pronation       Wrist supination       (Blank rows = not tested)  UPPER EXTREMITY MMT:  MMT Right eval Left eval Left 12/06/23  Shoulder flexion 5 2 3+  Shoulder extension  3   Shoulder abduction 4 2 3+  Shoulder adduction     Shoulder internal rotation 5 3 3+  Shoulder external rotation 5 3 3+  Middle trapezius     Lower trapezius     Elbow flexion 4 4- 44  Elbow extension 4 4-   Wrist flexion     Wrist extension     Wrist ulnar deviation  3+   Wrist radial deviation  3+   Wrist pronation  4   Wrist supination  4   Grip strength (lbs)  Did not test with dynamometer;MMT 3+   (Blank rows = not tested)  SHOULDER SPECIAL TESTS: Not tested  JOINT MOBILITY TESTING:  Inferior glide- very stiff and end ROM A-P and P-A: normal at mid ROM  PALPATION:  Non-tender throughout                                                                                                                                                                                                 TREATMENT:                                                                                                        DATE: 12/08/2023 Theractivity :(ROM and or strength for reaching) UBE L2 3 min forward, 3 min backward seat #8 UE ranger X 10 flexion, circles X 15 CW and CCW Wall ladder X 10 flexion, hold 5 sec Standing rows green 2X10 Standing shoulder extension green 2X10 Standing shoulder  IR green 2X10 Standing shoulder ER green 2X10 Lifting left arm into first shelf of top cabinet, 2# 2X10 Standing pball roll up wall for flexion AAROM 5 sec X 10 Supine wand AAROM stretch ER 5 sec 2X10 Supine wand flexion AAROM stretch flexion 5 sec 2X10  DATE:  12/06/2023 Theractivity :(ROM and or strength for reaching) UBE L2 3 min forward, 3 min backward seat #8 UE ranger X 10 flexion, circles X 15 CW and CCW Standing rows green 2X10 Standing shoulder extension green 2X10 Standing shoulder IR green 2X10 Standing shoulder ER green 2X10 Lifting left arm into first shelf of top cabinet, 2# 2X10 Standing pball roll up wall for flexion AAROM 5 sec X 10 Supine wand AAROM stretch ER 5 sec 2X10 Supine wand flexion AAROM stretch flexion 5 sec 2X10   DATE: 11/24/2023 Theractivity :(ROM and or strength for reaching) UBE L2 3 min forward, 3 min backward seat #8 Pulleys 2 min flexion, 2 min abduction UE ranger X 10 flexion, circles X 15 CW and CCW Lifting left arm into first shelf of top cabinet, 2# 2X10 Wall walks rolling pball up wall X 10.  Wall ladder X 10 flexion to #20 at best Therex Supine wand abduction AAROM 1#  bar 5 sec X 10 Supine wand ER 1# bar 5 sec hold X 10 Supine shoulder flexion AROM with 1# X 10  Manual therapy for skilled palpation and active compression with Trigger Point Dry Needling  Initial Treatment: Pt instructed on Dry Needling rational, procedures, and possible side effects. Pt instructed to expect mild to moderate muscle soreness later in the day and/or into the next day.  Pt instructed to continue prescribed HEP. Patient was educated on signs and symptoms of infection and other risk factors and advised to seek medical attention should they occur.  Patient verbalized understanding of these instructions and education.  Patient Verbal Consent Given: Yes Education Handout Provided: Yes Muscles Treated: left deltoids, Infraspinatus and supraspinatus Electrical Stimulation Performed: No  Treatment Response/Outcome:  good overall tolerance,twitch response noted     PATIENT EDUCATION: Education details: HEP, POC, pain and how hard to push with his exercises Person educated: Patient Education method: Explanation,  Demonstration, Verbal cues, and Handouts Education comprehension: verbalized understanding, returned demonstration, and verbal cues required  HOME EXERCISE PROGRAM: Access Code: [email or web address] URL: [email or web address] Date: 11/29/2023 Prepared by: Jamee Mazzoni  Exercises - Supine Shoulder Flexion Extension AAROM with Dowel  - 3-5 x daily - 7 x weekly - 1 sets - 10 reps - 5 sec hold - Supine Shoulder External Rotation in 45 Degrees Abduction AAROM with Dowel  - 3-5 x daily - 7 x weekly - 1 sets - 10 reps - 5 sec hold - Circular Shoulder Pendulum with Table Support  - 3-5 x daily - 7 x weekly - 1 sets - 15 reps - Seated Shoulder Abduction Towel Slide at Table Top  - 3-5 x daily - 6 x weekly - 1 sets - 10 reps - 5 sec hold - Seated Shoulder External Rotation PROM on Table  - 3-5 x daily - 6 x weekly - 1 sets - 10 reps - 5 hold - Standing Shoulder Row with Anchored Resistance  - 2 x daily - 6 x weekly - 2 sets - 10 reps - Shoulder extension with resistance - Neutral  - 2 x daily - 6 x weekly - 2 sets - 10 reps  ASSESSMENT:  CLINICAL IMPRESSION:He relays some transportation issues so may need to  hold PT for some time. He will continue with HEP in the meantime and will call back to schedule when he can. He has made progress but still may end up needing MUA in future.    OBJECTIVE IMPAIRMENTS: decreased activity tolerance, decreased knowledge of condition, decreased mobility, decreased ROM, decreased strength, hypomobility, increased edema, increased fascial restrictions, impaired perceived functional ability, increased muscle spasms, impaired sensation, impaired UE functional use, and pain.   ACTIVITY LIMITATIONS: carrying, lifting, bending, sitting, sleeping, bathing, dressing, reach over head, and hygiene/grooming  PARTICIPATION LIMITATIONS: meal prep, cleaning, laundry, driving, shopping, community activity, and yard work  PERSONAL FACTORS: Age, Past/current experiences, Time since  onset of injury/illness/exacerbation, and 3+ comorbidities: see above  are also affecting patient's functional outcome.   REHAB POTENTIAL: Fair Shoulder has been stiff since June 2024 with patient also fearful of movement due to pain.  CLINICAL DECISION MAKING: Evolving/moderate complexity  EVALUATION COMPLEXITY: Moderate   GOALS: Goals reviewed with patient? Yes  SHORT TERM GOALS: (target date for Short term goals are 3 weeks 11/16/2023)  1.Patient will demonstrate independent use of home exercise program to maintain progress from in clinic treatments. Goal status: MET 11/15/23  LONG TERM GOALS: (target dates for all long term goals are 8 weeks  12/21/2023 )   1. Patient will demonstrate/report pain at worst less than or equal to 2/10  at rest and less than 5/10 with reaching  Goal status: Ongoing  12/06/2023   2. Patient will demonstrate independent use of home exercise program to facilitate ability to maintain/progress functional gains from skilled physical therapy services. Goal status: Ongoing  12/06/2023   3. Patient will demonstrate Patient specific functional scale avg > or = 15/30 to indicate reduced disability due to condition.  Goal status: Ongoing  12/06/2023   4.  Patient will demonstrate LUE MMT 4/5 throughout to facilitate lifting, reaching, carrying at [facility] in daily activity.   Goal status: Ongoing  12/06/2023   5.  Patient will demonstrate Left GH joint AROM WFL s symptoms to facilitate usual overhead reaching, self care, dressing at PLOF.    Goal status: Ongoing  12/06/2023   6.  Patient will demonstrate LUE grip strength </= 10lb different than RUE  Goal status: Ongoing  12/06/2023    PLAN:  PT FREQUENCY: 1-3x/week  PT DURATION: 8 weeks  PLANNED INTERVENTIONS: Can include 45409- PT Re-evaluation, 97110-Therapeutic exercises, 97530- Therapeutic activity, 97112- Neuromuscular re-education, 97535- Self Care, 97140- Manual therapy, [phone]- Gait training, [phone]-  Orthotic Fit/training, [phone]- Canalith repositioning, [id number]- Aquatic Therapy, [phone]- Electrical stimulation (unattended), [phone]- Electrical stimulation (manual), [id number] Physical performance testing, 97016- Vasopneumatic device, [id number]- Ultrasound, [id number]- Traction (mechanical), [id number]- Ionotophoresis 4mg /ml Dexamethasone , Patient/Family education, Balance training, Stair training, Taping, Dry Needling, Joint mobilization, Joint manipulation, Spinal manipulation, Spinal mobilization, Scar mobilization, Vestibular training, Visual/preceptual remediation/compensation, DME instructions, Cryotherapy, and Moist heat.  All performed as medically necessary.  All included unless contraindicated  PLAN FOR NEXT SESSION:    strength and ROM geared towards ER, flexion, abd.     Mick Alamin, PT, DPT 12/08/2023, 2:27 PM

## 2023-12-16 ENCOUNTER — Ambulatory Visit (INDEPENDENT_AMBULATORY_CARE_PROVIDER_SITE_OTHER): Admitting: Rehabilitative and Restorative Service Providers"

## 2023-12-16 ENCOUNTER — Encounter: Payer: Self-pay | Admitting: Rehabilitative and Restorative Service Providers"

## 2023-12-16 DIAGNOSIS — M6281 Muscle weakness (generalized): Secondary | ICD-10-CM

## 2023-12-16 DIAGNOSIS — M25612 Stiffness of left shoulder, not elsewhere classified: Secondary | ICD-10-CM

## 2023-12-16 DIAGNOSIS — M25512 Pain in left shoulder: Secondary | ICD-10-CM | POA: Diagnosis not present

## 2023-12-16 DIAGNOSIS — G8929 Other chronic pain: Secondary | ICD-10-CM

## 2023-12-16 NOTE — Therapy (Signed)
 OUTPATIENT PHYSICAL THERAPY TREATMENT  Patient Name: Beckhem Damery MRN: [id number] DOB:1946-02-13, 78 y.o., male Today's Date: 12/16/2023  END OF SESSION:  PT End of Session - 12/16/23 1149     Visit Number 12    Number of Visits 15    Date for PT Re-Evaluation 01/19/24    Authorization Type [facility]    Authorization Time Period 09/21/2023-01/19/2024    Authorization - Visit Number 12    Authorization - Number of Visits 15    Progress Note Due on Visit 20    PT Start Time 1143    PT Stop Time 1225    PT Time Calculation (min) 42 min    Activity Tolerance Patient tolerated treatment well    Behavior During Therapy WFL for tasks assessed/performed              Past Medical History:  Diagnosis Date   Arthritis    Diabetes mellitus without complication (HCC)    GERD (gastroesophageal reflux disease)    Hepatitis    Hypertension    Parkinson's disease (HCC)    Positive TB test    as a child   Stroke (HCC) 08/10/2011   Past Surgical History:  Procedure Laterality Date   CHOLECYSTECTOMY     HAND SURGERY     9 subsequent surgeries   REVERSE SHOULDER ARTHROPLASTY Left 01/27/2023   Procedure: REVERSE SHOULDER ARTHROPLASTY;  Surgeon: Ellard Gunning, MD;  Location: [facility];  Service: Orthopedics;  Laterality: Left;    rotator cuff surgery Right    Patient Active Problem List   Diagnosis Date Noted   Fatigue 07/18/2023   Snoring 05/23/2023   Preoperative clearance 01/19/2023   Callus 10/28/2022   Isolated memory impairment 07/23/2022   Lightheadedness 07/07/2022   Medication refill 05/11/2022   Pain of left eye 05/11/2022   Red eye 05/11/2022   Lung nodule seen on imaging study 04/26/2022   Lung nodule 04/26/2022   Abnormal chest x-ray 04/26/2022   DOE (dyspnea on exertion) 04/22/2022   Acute non-recurrent maxillary sinusitis 04/08/2022   High risk medication use 03/22/2022   Elevated hemoglobin (HCC) 03/22/2022   Former smoker 01/28/2022    Unexplained weight loss 01/28/2022   Bilateral edema of lower extremity 10/09/2021   Pain, dental 10/09/2021   Preventative health care 10/09/2021   Tremor 09/18/2021   Left hip pain 09/18/2021   Anxiety and depression 08/26/2021   Acute pain of left knee 08/26/2021   Grief 08/26/2021   Rotator cuff tear arthropathy 06/23/2021   Psychosexual dysfunction with inhibited sexual excitement 06/23/2021   Obesity 06/23/2021   Benign neoplasm of colon 06/23/2021   Carpal tunnel syndrome 06/23/2021   History of adenomatous polyp of colon 06/23/2021   Brachial neuritis 06/23/2021   Parkinson's disease (HCC) 06/23/2021   Dysuria 03/18/2021   Low testosterone in male 03/18/2021   Hypokalemia 01/06/2021   Pain in left shoulder 11/05/2020   Bilateral primary osteoarthritis of knee 12/19/2019   Unilateral primary osteoarthritis, right knee 10/30/2019   Insomnia 05/06/2015   Essential hypertension 05/06/2015   Chronic back pain 05/06/2015   Type 2 diabetes mellitus without complication (HCC) 05/06/2015   Stroke (HCC) 05/06/2015   HLD (hyperlipidemia) 05/06/2015   BPH (benign prostatic hyperplasia) 05/06/2015   Esophageal reflux 05/06/2015   History of TB (tuberculosis) 05/06/2015   Iron  deficiency 04/10/2015    PCP: Dorothe Gaster, NP  REFERRING PROVIDER: Achille Ache, MD   REFERRING DIAG:  [phone] (ICD-10-CM) -  Presence of right artificial shoulder joint  L SHOULDER PAIN S/P L RTSA   THERAPY DIAG:  Chronic left shoulder pain  Stiffness of left shoulder, not elsewhere classified  Muscle weakness (generalized)  Rationale for Evaluation and Treatment: Rehabilitation  ONSET DATE: January 27, 2023 S/P rTSA  SUBJECTIVE:                                                                                                                                                                                      SUBJECTIVE STATEMENT: Pt arrived with complaints from neck down arm elevated  insidiously in last few days.  Reported neck to arm pain.   PERTINENT HISTORY: S/P rTSA 01/27/23, Chronic LBP, HTN, HLD, CVA (date unknown), DM2, Pagets disease of bone, action tremor  PAIN:  NPRS scale:  7/10 Pain location: Lt neck, shoulder, Lt arm Pain description: "tooth ache pain" Aggravating factors: reaching Relieving factors: ice, rest  PRECAUTIONS: None  WEIGHT BEARING RESTRICTIONS: No  FALLS:  Has patient fallen in last 6 months? No  LIVING ENVIRONMENT: Lives with: lives alone Lives in: House/apartment Stairs: NA Has following equipment at home: NA  OCCUPATION: Retired- [employer]  PLOF: Independent  PATIENT GOALS: play pickle ball again  Next MD visit: none scheduled  OBJECTIVE:   DIAGNOSTIC FINDINGS:  None on file from post surgery  PATIENT SURVEYS:  Patient-Specific Activity Scoring Scheme  "0" represents "unable to perform." "10" represents "able to perform at prior level. 0 1 2 3 4 5 6 7 8 9  10 (Date and Score)   Activity Eval  12/06/23   1. Lifting above head 2 4   2. Reaching arm in front of him  0  4  3. Close doors 4 5  4.    5.    Score 6/30 9/30   Total score = sum of the activity scores/number of activities Minimum detectable change (90%CI) for average score = 2 points Minimum detectable change (90%CI) for single activity score = 3 points  COGNITION: Overall cognitive status: WFL     SENSATION: Light touch: Impaired C3-C7 on Lt side with patient reporting 70% sensation compared to Rt side.    POSTURE: L shoulder rounded forward more than R, however both are rounded.   UPPER EXTREMITY ROM:   ROM Right eval Left eval Left 11/15/23 Left 11/24/23 Left 12/16/2023  Shoulder flexion 120 P: 90 A: 10 P:110 P:112 AAROM in supine with wand: 90 deg  Shoulder extension [facility]     Shoulder abduction 160 65 P:90 P:90   Shoulder adduction       Shoulder internal rotation Touches stomach Touches stomach  Shoulder  external rotation 40 P: 4 A: 2 P:40 P:45   Elbow flexion [facility] WFL     Elbow extension [facility]     Wrist flexion       Wrist extension       Wrist ulnar deviation       Wrist radial deviation       Wrist pronation       Wrist supination       (Blank rows = not tested)  UPPER EXTREMITY MMT:  MMT Right eval Left eval Left 12/06/23  Shoulder flexion 5 2 3+  Shoulder extension  3   Shoulder abduction 4 2 3+  Shoulder adduction     Shoulder internal rotation 5 3 3+  Shoulder external rotation 5 3 3+  Middle trapezius     Lower trapezius     Elbow flexion 4 4- 44  Elbow extension 4 4-   Wrist flexion     Wrist extension     Wrist ulnar deviation  3+   Wrist radial deviation  3+   Wrist pronation  4   Wrist supination  4   Grip strength (lbs)  Did not test with dynamometer;MMT 3+   (Blank rows = not tested)  SHOULDER SPECIAL TESTS: Not tested  JOINT MOBILITY TESTING:  Inferior glide- very stiff and end ROM A-P and P-A: normal at mid ROM  PALPATION:  Non-tender throughout                                                                                                                                                                                                 TREATMENT:                                                                                                       DATE: 12/16/2023 Therex: UBE fwd/back 3 mins each way UE/LE lvl 1.5 Supine wand AAROM 2-3 sec hold 2 x 10  Supine Lt ER wand 5 sec hold x 10   Manual: Percussive device to Lt upper trap, levator, Lt infraspinatus trigger points.   Estim IFC to Lt neck/shoulder to tolerance 10 mins    TREATMENT:  DATE: 12/08/2023 Theractivity :(ROM and or strength for reaching) UBE L2 3 min forward, 3 min backward seat #8 UE ranger X 10 flexion, circles X 15 CW and CCW Wall ladder X 10 flexion, hold 5 sec Standing  rows green 2X10 Standing shoulder extension green 2X10 Standing shoulder IR green 2X10 Standing shoulder ER green 2X10 Lifting left arm into first shelf of top cabinet, 2# 2X10 Standing pball roll up wall for flexion AAROM 5 sec X 10 Supine wand AAROM stretch ER 5 sec 2X10 Supine wand flexion AAROM stretch flexion 5 sec 2X10  TREATMENT:                                                                                                       DATE: 12/06/2023 Theractivity :(ROM and or strength for reaching) UBE L2 3 min forward, 3 min backward seat #8 UE ranger X 10 flexion, circles X 15 CW and CCW Standing rows green 2X10 Standing shoulder extension green 2X10 Standing shoulder IR green 2X10 Standing shoulder ER green 2X10 Lifting left arm into first shelf of top cabinet, 2# 2X10 Standing pball roll up wall for flexion AAROM 5 sec X 10 Supine wand AAROM stretch ER 5 sec 2X10 Supine wand flexion AAROM stretch flexion 5 sec 2X10   TREATMENT:                                                                                                       DATE4/17/2025 Theractivity :(ROM and or strength for reaching) UBE L2 3 min forward, 3 min backward seat #8 Pulleys 2 min flexion, 2 min abduction UE ranger X 10 flexion, circles X 15 CW and CCW Lifting left arm into first shelf of top cabinet, 2# 2X10 Wall walks rolling pball up wall X 10.  Wall ladder X 10 flexion to #20 at best Therex Supine wand abduction AAROM 1#  bar 5 sec X 10 Supine wand ER 1# bar 5 sec hold X 10 Supine shoulder flexion AROM with 1# X 10  Manual therapy for skilled palpation and active compression with Trigger Point Dry Needling  Initial Treatment: Pt instructed on Dry Needling rational, procedures, and possible side effects. Pt instructed to expect mild to moderate muscle soreness later in the day and/or into the next day.  Pt instructed to continue prescribed HEP. Patient was educated on signs and symptoms of infection  and other risk factors and advised to seek medical attention should they occur.  Patient verbalized understanding of these instructions and education.  Patient Verbal Consent Given: Yes Education Handout Provided: Yes Muscles Treated: left deltoids, Infraspinatus and supraspinatus Electrical Stimulation Performed: No  Treatment Response/Outcome:  good overall tolerance,twitch response noted     PATIENT EDUCATION: Education details: HEP, POC, pain and how hard to push with his exercises Person educated: Patient Education method: Explanation, Demonstration, Verbal cues, and Handouts Education comprehension: verbalized understanding, returned demonstration, and verbal cues required  HOME EXERCISE PROGRAM: Access Code: [email or web address] URL: [email or web address] Date: 11/29/2023 Prepared by: Jamee Mazzoni  Exercises - Supine Shoulder Flexion Extension AAROM with Dowel  - 3-5 x daily - 7 x weekly - 1 sets - 10 reps - 5 sec hold - Supine Shoulder External Rotation in 45 Degrees Abduction AAROM with Dowel  - 3-5 x daily - 7 x weekly - 1 sets - 10 reps - 5 sec hold - Circular Shoulder Pendulum with Table Support  - 3-5 x daily - 7 x weekly - 1 sets - 15 reps - Seated Shoulder Abduction Towel Slide at Table Top  - 3-5 x daily - 6 x weekly - 1 sets - 10 reps - 5 sec hold - Seated Shoulder External Rotation PROM on Table  - 3-5 x daily - 6 x weekly - 1 sets - 10 reps - 5 hold - Standing Shoulder Row with Anchored Resistance  - 2 x daily - 6 x weekly - 2 sets - 10 reps - Shoulder extension with resistance - Neutral  - 2 x daily - 6 x weekly - 2 sets - 10 reps  ASSESSMENT:  CLINICAL IMPRESSION: Elevated symptoms noted upon arrival with restriction in mobility noted.  Encouraged mobility as able in HEP.  ESTIM performed today to help symptoms as well as manual STM release techniques.  May use again in future as able.     OBJECTIVE IMPAIRMENTS: decreased activity tolerance, decreased  knowledge of condition, decreased mobility, decreased ROM, decreased strength, hypomobility, increased edema, increased fascial restrictions, impaired perceived functional ability, increased muscle spasms, impaired sensation, impaired UE functional use, and pain.   ACTIVITY LIMITATIONS: carrying, lifting, bending, sitting, sleeping, bathing, dressing, reach over head, and hygiene/grooming  PARTICIPATION LIMITATIONS: meal prep, cleaning, laundry, driving, shopping, community activity, and yard work  PERSONAL FACTORS: Age, Past/current experiences, Time since onset of injury/illness/exacerbation, and 3+ comorbidities: see above are also affecting patient's functional outcome.   REHAB POTENTIAL: Fair Shoulder has been stiff since June 2024 with patient also fearful of movement due to pain.  CLINICAL DECISION MAKING: Evolving/moderate complexity  EVALUATION COMPLEXITY: Moderate   GOALS: Goals reviewed with patient? Yes  SHORT TERM GOALS: (target date for Short term goals are 3 weeks 11/16/2023)  1.Patient will demonstrate independent use of home exercise program to maintain progress from in clinic treatments. Goal status: MET 11/15/23  LONG TERM GOALS: (target dates for all long term goals are 8 weeks  12/21/2023 )   1. Patient will demonstrate/report pain at worst less than or equal to 2/10  at rest and less than 5/10 with reaching  Goal status: Ongoing  12/06/2023   2. Patient will demonstrate independent use of home exercise program to facilitate ability to maintain/progress functional gains from skilled physical therapy services. Goal status: Ongoing  12/06/2023   3. Patient will demonstrate Patient specific functional scale avg > or = 15/30 to indicate reduced disability due to condition.  Goal status: Ongoing  12/06/2023   4.  Patient will demonstrate LUE MMT 4/5 throughout to facilitate lifting, reaching, carrying at [facility] in daily activity.   Goal status: Ongoing  12/06/2023   5.   Patient will demonstrate Left GH joint AROM WFL s symptoms to facilitate  usual overhead reaching, self care, dressing at PLOF.    Goal status: Ongoing  12/06/2023   6.  Patient will demonstrate LUE grip strength </= 10lb different than RUE  Goal status: Ongoing  12/06/2023    PLAN:  PT FREQUENCY: 1-3x/week  PT DURATION: 8 weeks  PLANNED INTERVENTIONS: Can include 16109- PT Re-evaluation, 97110-Therapeutic exercises, 97530- Therapeutic activity, 97112- Neuromuscular re-education, 97535- Self Care, 97140- Manual therapy, [phone]- Gait training, [phone]- Orthotic Fit/training, [phone]- Canalith repositioning, [id number]- Aquatic Therapy, [phone]- Electrical stimulation (unattended), [phone]- Electrical stimulation (manual), [id number] Physical performance testing, 97016- Vasopneumatic device, [id number]- Ultrasound, [id number]- Traction (mechanical), [id number]- Ionotophoresis 4mg /ml Dexamethasone , Patient/Family education, Balance training, Stair training, Taping, Dry Needling, Joint mobilization, Joint manipulation, Spinal manipulation, Spinal mobilization, Scar mobilization, Vestibular training, Visual/preceptual remediation/compensation, DME instructions, Cryotherapy, and Moist heat.  All performed as medically necessary.  All included unless contraindicated  PLAN FOR NEXT SESSION:  Resume plan as able.    Bonna Bustard, PT, DPT, OCS, ATC 12/16/23  12:28 PM

## 2023-12-19 ENCOUNTER — Encounter: Payer: Self-pay | Admitting: Physical Therapy

## 2023-12-19 ENCOUNTER — Ambulatory Visit: Admitting: Physical Therapy

## 2023-12-19 DIAGNOSIS — M25612 Stiffness of left shoulder, not elsewhere classified: Secondary | ICD-10-CM

## 2023-12-19 DIAGNOSIS — M25512 Pain in left shoulder: Secondary | ICD-10-CM

## 2023-12-19 DIAGNOSIS — M6281 Muscle weakness (generalized): Secondary | ICD-10-CM

## 2023-12-19 DIAGNOSIS — G8929 Other chronic pain: Secondary | ICD-10-CM | POA: Diagnosis not present

## 2023-12-19 NOTE — Therapy (Signed)
 OUTPATIENT PHYSICAL THERAPY TREATMENT  Patient Name: James Pearson MRN: [id number] DOB:[birth year], 78 y.o., male Today's Date: 12/19/2023  END OF SESSION:  PT End of Session - 12/19/23 1302     Visit Number 13    Number of Visits 15    Date for PT Re-Evaluation 01/19/24    Authorization Type [facility]    Authorization Time Period 09/21/2023-01/19/2024    Authorization - Visit Number 13    Authorization - Number of Visits 15    Progress Note Due on Visit 20    PT Start Time 1300    PT Stop Time 1341    PT Time Calculation (min) 41 min    Activity Tolerance Patient tolerated treatment well;Patient limited by pain    Behavior During Therapy [facility] for tasks assessed/performed               Past Medical History:  Diagnosis Date   Arthritis    Diabetes mellitus without complication (HCC)    GERD (gastroesophageal reflux disease)    Hepatitis    Hypertension    Parkinson's disease (HCC)    Positive TB test    as a child   Stroke (HCC) 08/10/2011   Past Surgical History:  Procedure Laterality Date   CHOLECYSTECTOMY     HAND SURGERY     9 subsequent surgeries   REVERSE SHOULDER ARTHROPLASTY Left 01/27/2023   Procedure: REVERSE SHOULDER ARTHROPLASTY;  Surgeon: Ellard Gunning, MD;  Location: [facility];  Service: Orthopedics;  Laterality: Left;    rotator cuff surgery Right    Patient Active Problem List   Diagnosis Date Noted   Fatigue 07/18/2023   Snoring 05/23/2023   Preoperative clearance 01/19/2023   Callus 10/28/2022   Isolated memory impairment 07/23/2022   Lightheadedness 07/07/2022   Medication refill 05/11/2022   Pain of left eye 05/11/2022   Red eye 05/11/2022   Lung nodule seen on imaging study 04/26/2022   Lung nodule 04/26/2022   Abnormal chest x-ray 04/26/2022   DOE (dyspnea on exertion) 04/22/2022   Acute non-recurrent maxillary sinusitis 04/08/2022   High risk medication use 03/22/2022   Elevated hemoglobin (HCC) 03/22/2022    Former smoker 01/28/2022   Unexplained weight loss 01/28/2022   Bilateral edema of lower extremity 10/09/2021   Pain, dental 10/09/2021   Preventative health care 10/09/2021   Tremor 09/18/2021   Left hip pain 09/18/2021   Anxiety and depression 08/26/2021   Acute pain of left knee 08/26/2021   Grief 08/26/2021   Rotator cuff tear arthropathy 06/23/2021   Psychosexual dysfunction with inhibited sexual excitement 06/23/2021   Obesity 06/23/2021   Benign neoplasm of colon 06/23/2021   Carpal tunnel syndrome 06/23/2021   History of adenomatous polyp of colon 06/23/2021   Brachial neuritis 06/23/2021   Parkinson's disease (HCC) 06/23/2021   Dysuria 03/18/2021   Low testosterone in male 03/18/2021   Hypokalemia 01/06/2021   Pain in left shoulder 11/05/2020   Bilateral primary osteoarthritis of knee 12/19/2019   Unilateral primary osteoarthritis, right knee 10/30/2019   Insomnia 05/06/2015   Essential hypertension 05/06/2015   Chronic back pain 05/06/2015   Type 2 diabetes mellitus without complication (HCC) 05/06/2015   Stroke (HCC) 05/06/2015   HLD (hyperlipidemia) 05/06/2015   BPH (benign prostatic hyperplasia) 05/06/2015   Esophageal reflux 05/06/2015   History of TB (tuberculosis) 05/06/2015   Iron  deficiency 04/10/2015    PCP: Dorothe Gaster, NP  REFERRING PROVIDER: Achille Ache, MD   REFERRING DIAG:  Z96.611 (ICD-10-CM) - Presence of right artificial shoulder joint  L SHOULDER PAIN S/P L RTSA   THERAPY DIAG:  Chronic left shoulder pain  Stiffness of left shoulder, not elsewhere classified  Muscle weakness (generalized)  Left shoulder pain, unspecified chronicity  Rationale for Evaluation and Treatment: Rehabilitation  ONSET DATE: January 27, 2023 S/P rTSA  SUBJECTIVE:                                                                                                                                                                                       SUBJECTIVE STATEMENT: His shoulder is very painful limiting what he can do.  Rainy days make it worse. [facility] approved MUA but it has not been scheduled.    PERTINENT HISTORY: S/P rTSA 01/27/23, Chronic LBP, HTN, HLD, CVA (date unknown), DM2, Pagets disease of bone, action tremor  PAIN:  NPRS scale:  rest 4/10 and using arm counter top 4/10 & raising arm 6/10 Pain location: Lt neck, shoulder, Lt arm Pain description: "tooth ache pain" Aggravating factors: reaching Relieving factors: ice, rest  PRECAUTIONS: None  WEIGHT BEARING RESTRICTIONS: No  FALLS:  Has patient fallen in last 6 months? No  LIVING ENVIRONMENT: Lives with: lives alone Lives in: House/apartment Stairs: NA Has following equipment at home: NA  OCCUPATION: Retired- [employer]  PLOF: Independent  PATIENT GOALS: play pickle ball again  Next MD visit: none scheduled  OBJECTIVE:   DIAGNOSTIC FINDINGS:  None on file from post surgery  PATIENT SURVEYS:  Patient-Specific Activity Scoring Scheme  "0" represents "unable to perform." "10" represents "able to perform at prior level. 0 1 2 3 4 5 6 7 8 9  10 (Date and Score)   Activity Eval  12/06/23   1. Lifting above head 2 4   2. Reaching arm in front of him  0  4  3. Close doors 4 5  4.    5.    Score 6/30 9/30   Total score = sum of the activity scores/number of activities Minimum detectable change (90%CI) for average score = 2 points Minimum detectable change (90%CI) for single activity score = 3 points  COGNITION: Overall cognitive status: WFL     SENSATION: Light touch: Impaired C3-C7 on Lt side with patient reporting 70% sensation compared to Rt side.    POSTURE: L shoulder rounded forward more than R, however both are rounded.   UPPER EXTREMITY ROM:   ROM Right eval Left eval Left 11/15/23 Left 11/24/23 Left 12/16/2023  Shoulder flexion 120 P: 90 A: 10 P:110 P:112 AAROM in supine with wand: 90 deg  Shoulder extension  [facility]  Shoulder abduction 160 65 P:90 P:90   Shoulder adduction       Shoulder internal rotation Touches stomach Touches stomach     Shoulder external rotation 40 P: 4 A: 2 P:40 P:45   Elbow flexion [facility] WFL     Elbow extension [facility]     Wrist flexion       Wrist extension       Wrist ulnar deviation       Wrist radial deviation       Wrist pronation       Wrist supination       (Blank rows = not tested)  UPPER EXTREMITY MMT:  MMT Right eval Left eval Left 12/06/23  Shoulder flexion 5 2 3+  Shoulder extension  3   Shoulder abduction 4 2 3+  Shoulder adduction     Shoulder internal rotation 5 3 3+  Shoulder external rotation 5 3 3+  Middle trapezius     Lower trapezius     Elbow flexion 4 4- 44  Elbow extension 4 4-   Wrist flexion     Wrist extension     Wrist ulnar deviation  3+   Wrist radial deviation  3+   Wrist pronation  4   Wrist supination  4   Grip strength (lbs)  Did not test with dynamometer;MMT 3+   (Blank rows = not tested)  SHOULDER SPECIAL TESTS: Not tested  JOINT MOBILITY TESTING:  Inferior glide- very stiff and end ROM A-P and P-A: normal at mid ROM  PALPATION:  Non-tender throughout                                                                                                                                                                                                 TREATMENT:                                                                                                       DATE: 12/19/2023 Therapeutic Exercise: Pendulum flex/ext, abd/add & circles 10 reps 2 sets.   Standing leaning over table top RUE support: LUE flex/ext, abd/add & circles 10 reps each Supine chest press BUEs 1#  bar 10 reps 2 sets.  Supine Bil. Shoulder flexion  1# bar 10 reps 2 sets with short lever arm (elbows bent). Supine cervical retraction 10 reps 5 sec hold & scapular retraction 5 sec hold 10 reps.   RUE isometric shoulder adduction 10 reps 5 sec hold.     Self:care: PT demo & verbal cues on supporting RUE with Yoga block and moving arm off block with adduction initially.  Pt verbalized understanding.    Manual Therapy: Traction RUE with PT progressively increasing abducted position.  PROM right shoulder flexion, abduction, internal & external rotation.    TREATMENT:                                                                                                       DATE: 12/16/2023 Therex: UBE fwd/back 3 mins each way UE/LE lvl 1.5 Supine wand AAROM 2-3 sec hold 2 x 10  Supine Lt ER wand 5 sec hold x 10   Manual: Percussive device to Lt upper trap, levator, Lt infraspinatus trigger points.   Estim IFC to Lt neck/shoulder to tolerance 10 mins    TREATMENT:                                                                                                       DATE: 12/08/2023 Theractivity :(ROM and or strength for reaching) UBE L2 3 min forward, 3 min backward seat #8 UE ranger X 10 flexion, circles X 15 CW and CCW Wall ladder X 10 flexion, hold 5 sec Standing rows green 2X10 Standing shoulder extension green 2X10 Standing shoulder IR green 2X10 Standing shoulder ER green 2X10 Lifting left arm into first shelf of top cabinet, 2# 2X10 Standing pball roll up wall for flexion AAROM 5 sec X 10 Supine wand AAROM stretch ER 5 sec 2X10 Supine wand flexion AAROM stretch flexion 5 sec 2X10  TREATMENT:                                                                                                       DATE: 12/06/2023 Theractivity :(ROM and or strength for reaching) UBE L2 3 min forward, 3 min backward seat #8 UE ranger X 10 flexion, circles X 15 CW and CCW  Standing rows green 2X10 Standing shoulder extension green 2X10 Standing shoulder IR green 2X10 Standing shoulder ER green 2X10 Lifting left arm into first shelf of top cabinet, 2# 2X10 Standing pball roll up wall for flexion AAROM 5 sec X 10 Supine wand AAROM stretch ER 5 sec  2X10 Supine wand flexion AAROM stretch flexion 5 sec 2X10   TREATMENT:                                                                                                       DATE4/17/2025 Theractivity :(ROM and or strength for reaching) UBE L2 3 min forward, 3 min backward seat #8 Pulleys 2 min flexion, 2 min abduction UE ranger X 10 flexion, circles X 15 CW and CCW Lifting left arm into first shelf of top cabinet, 2# 2X10 Wall walks rolling pball up wall X 10.  Wall ladder X 10 flexion to #20 at best Therex Supine wand abduction AAROM 1#  bar 5 sec X 10 Supine wand ER 1# bar 5 sec hold X 10 Supine shoulder flexion AROM with 1# X 10  Manual therapy for skilled palpation and active compression with Trigger Point Dry Needling  Initial Treatment: Pt instructed on Dry Needling rational, procedures, and possible side effects. Pt instructed to expect mild to moderate muscle soreness later in the day and/or into the next day.  Pt instructed to continue prescribed HEP. Patient was educated on signs and symptoms of infection and other risk factors and advised to seek medical attention should they occur.  Patient verbalized understanding of these instructions and education.  Patient Verbal Consent Given: Yes Education Handout Provided: Yes Muscles Treated: left deltoids, Infraspinatus and supraspinatus Electrical Stimulation Performed: No  Treatment Response/Outcome:  good overall tolerance,twitch response noted     PATIENT EDUCATION: Education details: HEP, POC, pain and how hard to push with his exercises Person educated: Patient Education method: Explanation, Demonstration, Verbal cues, and Handouts Education comprehension: verbalized understanding, returned demonstration, and verbal cues required  HOME EXERCISE PROGRAM: Access Code: [email or web address] URL: [email or web address] Date: 11/29/2023 Prepared by: Jamee Mazzoni  Exercises - Supine Shoulder Flexion Extension AAROM  with Dowel  - 3-5 x daily - 7 x weekly - 1 sets - 10 reps - 5 sec hold - Supine Shoulder External Rotation in 45 Degrees Abduction AAROM with Dowel  - 3-5 x daily - 7 x weekly - 1 sets - 10 reps - 5 sec hold - Circular Shoulder Pendulum with Table Support  - 3-5 x daily - 7 x weekly - 1 sets - 15 reps - Seated Shoulder Abduction Towel Slide at Table Top  - 3-5 x daily - 6 x weekly - 1 sets - 10 reps - 5 sec hold - Seated Shoulder External Rotation PROM on Table  - 3-5 x daily - 6 x weekly - 1 sets - 10 reps - 5 hold - Standing Shoulder Row with Anchored Resistance  - 2 x daily - 6 x weekly - 2 sets - 10 reps - Shoulder extension with resistance - Neutral  - 2 x daily -  6 x weekly - 2 sets - 10 reps  ASSESSMENT:  CLINICAL IMPRESSION: Patient responded to PT hands on cueing for shoulder motions.  He continues to have pain and limited shoulder range.  Patient reports that [facility] approved Manipulation Under Anesthesia.    OBJECTIVE IMPAIRMENTS: decreased activity tolerance, decreased knowledge of condition, decreased mobility, decreased ROM, decreased strength, hypomobility, increased edema, increased fascial restrictions, impaired perceived functional ability, increased muscle spasms, impaired sensation, impaired UE functional use, and pain.   ACTIVITY LIMITATIONS: carrying, lifting, bending, sitting, sleeping, bathing, dressing, reach over head, and hygiene/grooming  PARTICIPATION LIMITATIONS: meal prep, cleaning, laundry, driving, shopping, community activity, and yard work  PERSONAL FACTORS: Age, Past/current experiences, Time since onset of injury/illness/exacerbation, and 3+ comorbidities: see above are also affecting patient's functional outcome.   REHAB POTENTIAL: Fair Shoulder has been stiff since June 2024 with patient also fearful of movement due to pain.  CLINICAL DECISION MAKING: Evolving/moderate complexity  EVALUATION COMPLEXITY: Moderate   GOALS: Goals reviewed with patient?  Yes  SHORT TERM GOALS: (target date for Short term goals are 3 weeks 11/16/2023)  1.Patient will demonstrate independent use of home exercise program to maintain progress from in clinic treatments. Goal status: MET 11/15/23  LONG TERM GOALS: (target dates for all long term goals are 8 weeks  12/21/2023 )   1. Patient will demonstrate/report pain at worst less than or equal to 2/10  at rest and less than 5/10 with reaching  Goal status: Ongoing 12/19/2023   2. Patient will demonstrate independent use of home exercise program to facilitate ability to maintain/progress functional gains from skilled physical therapy services. Goal status: Ongoing 12/19/2023   3. Patient will demonstrate Patient specific functional scale avg > or = 15/30 to indicate reduced disability due to condition.  Goal status: Ongoing 12/19/2023   4.  Patient will demonstrate LUE MMT 4/5 throughout to facilitate lifting, reaching, carrying at [facility] in daily activity.   Goal status: Ongoing  12/19/2023   5.  Patient will demonstrate Left GH joint AROM WFL s symptoms to facilitate usual overhead reaching, self care, dressing at PLOF.    Goal status: Ongoing   12/19/2023   6.  Patient will demonstrate LUE grip strength </= 10lb different than RUE  Goal status: Ongoing  12/19/2023    PLAN:  PT FREQUENCY: 1-3x/week  PT DURATION: 8 weeks  PLANNED INTERVENTIONS: Can include 16109- PT Re-evaluation, 97110-Therapeutic exercises, 97530- Therapeutic activity, 97112- Neuromuscular re-education, 97535- Self Care, 97140- Manual therapy, [phone]- Gait training, [phone]- Orthotic Fit/training, [phone]- Canalith repositioning, [id number]- Aquatic Therapy, [phone]- Electrical stimulation (unattended), [phone]- Electrical stimulation (manual), [id number] Physical performance testing, 97016- Vasopneumatic device, [id number]- Ultrasound, [id number]- Traction (mechanical), [id number]- Ionotophoresis 4mg /ml Dexamethasone , Patient/Family education, Balance training, Stair training,  Taping, Dry Needling, Joint mobilization, Joint manipulation, Spinal manipulation, Spinal mobilization, Scar mobilization, Vestibular training, Visual/preceptual remediation/compensation, DME instructions, Cryotherapy, and Moist heat.  All performed as medically necessary.  All included unless contraindicated  PLAN FOR NEXT SESSION:  check if Manipulation is scheduled.  Do [facility] recert in next 2 visits.  Continue manual therapy & exercises to progress range.      Devion Chriscoe, PT, DPT 12/19/2023, 4:07 PM

## 2023-12-22 ENCOUNTER — Encounter: Payer: Self-pay | Admitting: Podiatry

## 2023-12-22 ENCOUNTER — Ambulatory Visit (INDEPENDENT_AMBULATORY_CARE_PROVIDER_SITE_OTHER): Admitting: Podiatry

## 2023-12-22 DIAGNOSIS — M79675 Pain in left toe(s): Secondary | ICD-10-CM

## 2023-12-22 DIAGNOSIS — L84 Corns and callosities: Secondary | ICD-10-CM

## 2023-12-22 DIAGNOSIS — E119 Type 2 diabetes mellitus without complications: Secondary | ICD-10-CM

## 2023-12-22 DIAGNOSIS — M79674 Pain in right toe(s): Secondary | ICD-10-CM | POA: Diagnosis not present

## 2023-12-22 DIAGNOSIS — B351 Tinea unguium: Secondary | ICD-10-CM

## 2023-12-22 NOTE — Progress Notes (Signed)
 This patient returns to my office for at risk foot care.  This patient requires this care by a professional since this patient will be at risk due to having type 2 diabetes.  This patient is unable to cut nails himself since the patient cannot reach his nails.These nails are painful walking and wearing shoes.  This patient presents for at risk foot care today.  General Appearance  Alert, conversant and in no acute stress.  Vascular  Dorsalis pedis and posterior tibial  pulses are palpable  bilaterally.  Capillary return is within normal limits  bilaterally. Temperature is within normal limits  bilaterally.  Neurologic  Senn-Weinstein monofilament wire test within normal limits  bilaterally. Muscle power within normal limits bilaterally.  Nails Thick disfigured discolored nails with subungual debris  from hallux to fifth toes bilaterally. No evidence of bacterial infection or drainage bilaterally.  Orthopedic  No limitations of motion  feet .  No crepitus or effusions noted.  No bony pathology or digital deformities noted.  Skin  normotropic skin noted bilaterally.  No signs of infections or ulcers noted.   Callus sub 5 left foot and heel callus left foot.  Onychomycosis  Pain in right toes  Pain in left toes  Consent was obtained for treatment procedures.   Mechanical debridement of nails 1-5  bilaterally performed with a nail nipper.  Filed with dremel without incident.    Return office visit   3 months                   Told patient to return for periodic foot care and evaluation due to potential at risk complications.   Ruffin Cotton DPM

## 2023-12-27 ENCOUNTER — Other Ambulatory Visit: Payer: Self-pay | Admitting: Nurse Practitioner

## 2023-12-27 DIAGNOSIS — Z760 Encounter for issue of repeat prescription: Secondary | ICD-10-CM

## 2024-01-10 ENCOUNTER — Encounter: Payer: Self-pay | Admitting: Physical Therapy

## 2024-01-10 ENCOUNTER — Ambulatory Visit (INDEPENDENT_AMBULATORY_CARE_PROVIDER_SITE_OTHER): Admitting: Physical Therapy

## 2024-01-10 DIAGNOSIS — M25512 Pain in left shoulder: Secondary | ICD-10-CM

## 2024-01-10 DIAGNOSIS — M6281 Muscle weakness (generalized): Secondary | ICD-10-CM

## 2024-01-10 DIAGNOSIS — M25612 Stiffness of left shoulder, not elsewhere classified: Secondary | ICD-10-CM

## 2024-01-10 DIAGNOSIS — G8929 Other chronic pain: Secondary | ICD-10-CM

## 2024-01-10 NOTE — Therapy (Signed)
 OUTPATIENT PHYSICAL THERAPY TREATMENT  Patient Name: Wylie Coon MRN: [id number] DOB:1945-09-17, 78 y.o., male Today's Date: 01/10/2024  END OF SESSION:  PT End of Session - 01/10/24 1454     Visit Number 14    Number of Visits 15    Date for PT Re-Evaluation 01/19/24    Authorization Type [facility]    Authorization Time Period 09/21/2023-01/19/2024    Authorization - Visit Number 14    Authorization - Number of Visits 15    Progress Note Due on Visit 20    PT Start Time 1430    PT Stop Time 1510    PT Time Calculation (min) 40 min    Activity Tolerance Patient tolerated treatment well;Patient limited by pain    Behavior During Therapy [facility] for tasks assessed/performed                Past Medical History:  Diagnosis Date   Arthritis    Diabetes mellitus without complication (HCC)    GERD (gastroesophageal reflux disease)    Hepatitis    Hypertension    Parkinson's disease (HCC)    Positive TB test    as a child   Stroke (HCC) 08/10/2011   Past Surgical History:  Procedure Laterality Date   CHOLECYSTECTOMY     HAND SURGERY     9 subsequent surgeries   REVERSE SHOULDER ARTHROPLASTY Left 01/27/2023   Procedure: REVERSE SHOULDER ARTHROPLASTY;  Surgeon: Ellard Gunning, MD;  Location: [facility];  Service: Orthopedics;  Laterality: Left;    rotator cuff surgery Right    Patient Active Problem List   Diagnosis Date Noted   Fatigue 07/18/2023   Snoring 05/23/2023   Preoperative clearance 01/19/2023   Callus 10/28/2022   Isolated memory impairment 07/23/2022   Lightheadedness 07/07/2022   Medication refill 05/11/2022   Pain of left eye 05/11/2022   Red eye 05/11/2022   Lung nodule seen on imaging study 04/26/2022   Lung nodule 04/26/2022   Abnormal chest x-ray 04/26/2022   DOE (dyspnea on exertion) 04/22/2022   Acute non-recurrent maxillary sinusitis 04/08/2022   High risk medication use 03/22/2022   Elevated hemoglobin (HCC) 03/22/2022    Former smoker 01/28/2022   Unexplained weight loss 01/28/2022   Bilateral edema of lower extremity 10/09/2021   Pain, dental 10/09/2021   Preventative health care 10/09/2021   Tremor 09/18/2021   Left hip pain 09/18/2021   Anxiety and depression 08/26/2021   Acute pain of left knee 08/26/2021   Grief 08/26/2021   Rotator cuff tear arthropathy 06/23/2021   Psychosexual dysfunction with inhibited sexual excitement 06/23/2021   Obesity 06/23/2021   Benign neoplasm of colon 06/23/2021   Carpal tunnel syndrome 06/23/2021   History of adenomatous polyp of colon 06/23/2021   Brachial neuritis 06/23/2021   Parkinson's disease (HCC) 06/23/2021   Dysuria 03/18/2021   Low testosterone in male 03/18/2021   Hypokalemia 01/06/2021   Pain in left shoulder 11/05/2020   Bilateral primary osteoarthritis of knee 12/19/2019   Unilateral primary osteoarthritis, right knee 10/30/2019   Insomnia 05/06/2015   Essential hypertension 05/06/2015   Chronic back pain 05/06/2015   Type 2 diabetes mellitus without complication (HCC) 05/06/2015   Stroke (HCC) 05/06/2015   HLD (hyperlipidemia) 05/06/2015   BPH (benign prostatic hyperplasia) 05/06/2015   Esophageal reflux 05/06/2015   History of TB (tuberculosis) 05/06/2015   Iron  deficiency 04/10/2015    PCP: Dorothe Gaster, NP  REFERRING PROVIDER: Achille Ache, MD   REFERRING  DIAG:  Z96.611 (ICD-10-CM) - Presence of right artificial shoulder joint  L SHOULDER PAIN S/P L RTSA   THERAPY DIAG:  Chronic left shoulder pain  Stiffness of left shoulder, not elsewhere classified  Muscle weakness (generalized)  Left shoulder pain, unspecified chronicity  Rationale for Evaluation and Treatment: Rehabilitation  ONSET DATE: January 27, 2023 S/P rTSA  SUBJECTIVE:                                                                                                                                                                                       SUBJECTIVE STATEMENT: His shoulder is very painful limiting what he can do.  Rainy days make it worse. [facility] approved MUA but it has not been scheduled.    PERTINENT HISTORY: S/P rTSA 01/27/23, Chronic LBP, HTN, HLD, CVA (date unknown), DM2, Pagets disease of bone, action tremor  PAIN:  NPRS scale:  rest 4/10 and using arm counter top 4/10 & raising arm 6/10 Pain location: Lt neck, shoulder, Lt arm Pain description: "tooth ache pain" Aggravating factors: reaching Relieving factors: ice, rest  PRECAUTIONS: None  WEIGHT BEARING RESTRICTIONS: No  FALLS:  Has patient fallen in last 6 months? No  LIVING ENVIRONMENT: Lives with: lives alone Lives in: House/apartment Stairs: NA Has following equipment at home: NA  OCCUPATION: Retired- Land  PLOF: Independent  PATIENT GOALS: play pickle ball again  Next MD visit: none scheduled  OBJECTIVE:   DIAGNOSTIC FINDINGS:  None on file from post surgery  PATIENT SURVEYS:  Patient-Specific Activity Scoring Scheme  "0" represents "unable to perform." "10" represents "able to perform at prior level. 0 1 2 3 4 5 6 7 8 9  10 (Date and Score)   Activity Eval  12/06/23   1. Lifting above head 2 4   2. Reaching arm in front of him  0  4  3. Close doors 4 5  4.    5.    Score 6/30 9/30   Total score = sum of the activity scores/number of activities Minimum detectable change (90%CI) for average score = 2 points Minimum detectable change (90%CI) for single activity score = 3 points  COGNITION: Overall cognitive status: WFL     SENSATION: Light touch: Impaired C3-C7 on Lt side with patient reporting 70% sensation compared to Rt side.    POSTURE: L shoulder rounded forward more than R, however both are rounded.   UPPER EXTREMITY ROM:   ROM Right eval Left eval Left 11/15/23 Left 11/24/23 Left 12/16/2023  Shoulder flexion 120 P: 90 A: 10 P:110 P:112 AAROM in supine with wand: 90 deg  Shoulder extension  [facility]  WFL     Shoulder abduction 160 65 P:90 P:90   Shoulder adduction       Shoulder internal rotation Touches stomach Touches stomach     Shoulder external rotation 40 P: 4 A: 2 P:40 P:45   Elbow flexion [facility] WFL     Elbow extension [facility]     Wrist flexion       Wrist extension       Wrist ulnar deviation       Wrist radial deviation       Wrist pronation       Wrist supination       (Blank rows = not tested)  UPPER EXTREMITY MMT:  MMT Right eval Left eval Left 12/06/23  Shoulder flexion 5 2 3+  Shoulder extension  3   Shoulder abduction 4 2 3+  Shoulder adduction     Shoulder internal rotation 5 3 3+  Shoulder external rotation 5 3 3+  Middle trapezius     Lower trapezius     Elbow flexion 4 4- 44  Elbow extension 4 4-   Wrist flexion     Wrist extension     Wrist ulnar deviation  3+   Wrist radial deviation  3+   Wrist pronation  4   Wrist supination  4   Grip strength (lbs)  Did not test with dynamometer;MMT 3+   (Blank rows = not tested)  SHOULDER SPECIAL TESTS: Not tested  JOINT MOBILITY TESTING:  Inferior glide- very stiff and end ROM A-P and P-A: normal at mid ROM  PALPATION:  Non-tender throughout                                                                                                                                                                                                 TREATMENT:                                                                                                       DATE: 01/10/2024 Therapeutic Exercise: UBE: level 2.5 3 minutes each direction Sitting table slides: flexion, abduction, ER all x 10 holding 5-10 sec Scapular squeezes: x 10 holding 5 sec  Wall ladder x 10 flexion (# 20 with shoulder hiking) and abduction x 10 Abd isometrics standing x 10 holding 5 sec Supine shoulder flexion 2# bar 2 x 10 holding end range Supine cervical retraction x 10 holding 5 sec Supine scapular stabilization: circles bil directions x  20 shoulder at 80 deg  Side lying abd: x 10  Side lying ER x 10   Manual Therapy: Traction RUE with PT progressively increasing abducted position.  PROM right shoulder flexion, abduction, internal & external rotation.       TREATMENT:                                                                                                       DATE: 12/19/2023 Therapeutic Exercise: Pendulum flex/ext, abd/add & circles 10 reps 2 sets.   Standing leaning over table top RUE support: LUE flex/ext, abd/add & circles 10 reps each Supine chest press BUEs 1# bar 10 reps 2 sets.  Supine Bil. Shoulder flexion  1# bar 10 reps 2 sets with short lever arm (elbows bent). Supine cervical retraction 10 reps 5 sec hold & scapular retraction 5 sec hold 10 reps.   RUE isometric shoulder adduction 10 reps 5 sec hold.    Self:care: PT demo & verbal cues on supporting RUE with Yoga block and moving arm off block with adduction initially.  Pt verbalized understanding.    Manual Therapy: Traction RUE with PT progressively increasing abducted position.  PROM right shoulder flexion, abduction, internal & external rotation.    TREATMENT:                                                                                                       DATE: 12/16/2023 Therex: UBE fwd/back 3 mins each way UE/LE lvl 1.5 Supine wand AAROM 2-3 sec hold 2 x 10  Supine Lt ER wand 5 sec hold x 10   Manual: Percussive device to Lt upper trap, levator, Lt infraspinatus trigger points.   Estim IFC to Lt neck/shoulder to tolerance 10 mins    TREATMENT:  DATE: 12/08/2023 Theractivity :(ROM and or strength for reaching) UBE L2 3 min forward, 3 min backward seat #8 UE ranger X 10 flexion, circles X 15 CW and CCW Wall ladder X 10 flexion, hold 5 sec Standing rows green 2X10 Standing shoulder extension green 2X10 Standing shoulder IR  green 2X10 Standing shoulder ER green 2X10 Lifting left arm into first shelf of top cabinet, 2# 2X10 Standing pball roll up wall for flexion AAROM 5 sec X 10 Supine wand AAROM stretch ER 5 sec 2X10 Supine wand flexion AAROM stretch flexion 5 sec 2X10     PATIENT EDUCATION: Education details: HEP, POC, pain and how hard to push with his exercises Person educated: Patient Education method: Explanation, Demonstration, Verbal cues, and Handouts Education comprehension: verbalized understanding, returned demonstration, and verbal cues required  HOME EXERCISE PROGRAM: Access Code: [email or web address] URL: [email or web address] Date: 11/29/2023 Prepared by: Jamee Mazzoni  Exercises - Supine Shoulder Flexion Extension AAROM with Dowel  - 3-5 x daily - 7 x weekly - 1 sets - 10 reps - 5 sec hold - Supine Shoulder External Rotation in 45 Degrees Abduction AAROM with Dowel  - 3-5 x daily - 7 x weekly - 1 sets - 10 reps - 5 sec hold - Circular Shoulder Pendulum with Table Support  - 3-5 x daily - 7 x weekly - 1 sets - 15 reps - Seated Shoulder Abduction Towel Slide at Table Top  - 3-5 x daily - 6 x weekly - 1 sets - 10 reps - 5 sec hold - Seated Shoulder External Rotation PROM on Table  - 3-5 x daily - 6 x weekly - 1 sets - 10 reps - 5 hold - Standing Shoulder Row with Anchored Resistance  - 2 x daily - 6 x weekly - 2 sets - 10 reps - Shoulder extension with resistance - Neutral  - 2 x daily - 6 x weekly - 2 sets - 10 reps  ASSESSMENT:  CLINICAL IMPRESSION: Pt tolerating exercises well limited by pain at end ranges. Patient reports that [facility] approved Manipulation Under Anesthesia, Pt reporting he is waiting to see if he improved any on his own. 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  We discussed only one visit left and we will have to apply for more visits if [facility] decides to perform the manipulation  OBJECTIVE IMPAIRMENTS: decreased activity tolerance, decreased  knowledge of condition, decreased mobility, decreased ROM, decreased strength, hypomobility, increased edema, increased fascial restrictions, impaired perceived functional ability, increased muscle spasms, impaired sensation, impaired UE functional use, and pain.   ACTIVITY LIMITATIONS: carrying, lifting, bending, sitting, sleeping, bathing, dressing, reach over head, and hygiene/grooming  PARTICIPATION LIMITATIONS: meal prep, cleaning, laundry, driving, shopping, community activity, and yard work  PERSONAL FACTORS: Age, Past/current experiences, Time since onset of injury/illness/exacerbation, and 3+ comorbidities: see above are also affecting patient's functional outcome.   REHAB POTENTIAL: Fair Shoulder has been stiff since June 2024 with patient also fearful of movement due to pain.  CLINICAL DECISION MAKING: Evolving/moderate complexity  EVALUATION COMPLEXITY: Moderate   GOALS: Goals reviewed with patient? Yes  SHORT TERM GOALS: (target date for Short term goals are 3 weeks 11/16/2023)  1.Patient will demonstrate independent use of home exercise program to maintain progress from in clinic treatments. Goal status: MET 11/15/23  LONG TERM GOALS: (target dates for all long term goals are 8 weeks  12/21/2023 )   1. Patient will demonstrate/report pain at worst less than or equal  to 2/10  at rest and less than 5/10 with reaching  Goal status: Ongoing 12/19/2023   2. Patient will demonstrate independent use of home exercise program to facilitate ability to maintain/progress functional gains from skilled physical therapy services. Goal status: Ongoing 12/19/2023   3. Patient will demonstrate Patient specific functional scale avg > or = 15/30 to indicate reduced disability due to condition.  Goal status: Ongoing 12/19/2023   4.  Patient will demonstrate LUE MMT 4/5 throughout to facilitate lifting, reaching, carrying at [facility] in daily activity.   Goal status: Ongoing  12/19/2023   5.   Patient will demonstrate Left GH joint AROM WFL s symptoms to facilitate usual overhead reaching, self care, dressing at PLOF.    Goal status: Ongoing   12/19/2023   6.  Patient will demonstrate LUE grip strength </= 10lb different than RUE  Goal status: Ongoing  12/19/2023    PLAN:  PT FREQUENCY: 1-3x/week  PT DURATION: 8 weeks  PLANNED INTERVENTIONS: Can include 09811- PT Re-evaluation, 97110-Therapeutic exercises, 97530- Therapeutic activity, 97112- Neuromuscular re-education, 97535- Self Care, 97140- Manual therapy, [phone]- Gait training, [phone]- Orthotic Fit/training, [phone]- Canalith repositioning, [id number]- Aquatic Therapy, [phone]- Electrical stimulation (unattended), [phone]- Electrical stimulation (manual), [id number] Physical performance testing, 97016- Vasopneumatic device, [id number]- Ultrasound, [id number]- Traction (mechanical), [id number]- Ionotophoresis 4mg /ml Dexamethasone , Patient/Family education, Balance training, Stair training, Taping, Dry Needling, Joint mobilization, Joint manipulation, Spinal manipulation, Spinal mobilization, Scar mobilization, Vestibular training, Visual/preceptual remediation/compensation, DME instructions, Cryotherapy, and Moist heat.  All performed as medically necessary.  All included unless contraindicated  PLAN FOR NEXT SESSION:  check if Manipulation is scheduled.   Continue manual therapy & exercises to progress range.   Re-Cert next visit    Marysue Sola, PT, MPT 01/10/2024, 3:02 PM

## 2024-01-12 ENCOUNTER — Encounter: Payer: Self-pay | Admitting: Rehabilitative and Restorative Service Providers"

## 2024-01-12 ENCOUNTER — Ambulatory Visit (INDEPENDENT_AMBULATORY_CARE_PROVIDER_SITE_OTHER): Admitting: Rehabilitative and Restorative Service Providers"

## 2024-01-12 DIAGNOSIS — M25612 Stiffness of left shoulder, not elsewhere classified: Secondary | ICD-10-CM

## 2024-01-12 DIAGNOSIS — M6281 Muscle weakness (generalized): Secondary | ICD-10-CM

## 2024-01-12 DIAGNOSIS — G8929 Other chronic pain: Secondary | ICD-10-CM

## 2024-01-12 DIAGNOSIS — M25512 Pain in left shoulder: Secondary | ICD-10-CM

## 2024-01-12 DIAGNOSIS — M542 Cervicalgia: Secondary | ICD-10-CM

## 2024-01-12 NOTE — Therapy (Addendum)
 OUTPATIENT PHYSICAL THERAPY TREATMENT / PROGRESS NOTE [facility]  Patient Name: James Pearson MRN: [id number] DOB:1945-10-06, 78 y.o., male Today's Date: 01/12/2024  PHYSICAL THERAPY DISCHARGE SUMMARY  Visits from Start of Care: 15  Current functional level related to goals / functional outcomes: PT submitted authorization for additional [facility] visits.  Patient has not returned a call to schedule further PT visits.   Remaining deficits: See below for last status known.    Education / Equipment: Patient was educated on HEP which he appeared to understand.  Patient agrees to discharge. Patient goals were not met. Patient is being discharged due to not returning since the last visit.   Grayce Spatz, PT, DPT 03/19/2024, 2:58 PM      Progress Note Reporting Period 12/06/2023 to 01/12/2024  See note below for Objective Data and Assessment of Progress/Goals.   END OF SESSION:  PT End of Session - 01/12/24 1401     Visit Number 15    Number of Visits 15    Date for PT Re-Evaluation 01/19/24    Authorization Type [facility]    Authorization Time Period 09/21/2023-01/19/2024    Authorization - Visit Number 15    Authorization - Number of Visits 15    PT Start Time 1344    PT Stop Time 1423    PT Time Calculation (min) 39 min    Activity Tolerance Patient limited by pain    Behavior During Therapy WFL for tasks assessed/performed                 Past Medical History:  Diagnosis Date   Arthritis    Diabetes mellitus without complication (HCC)    GERD (gastroesophageal reflux disease)    Hepatitis    Hypertension    Parkinson's disease (HCC)    Positive TB test    as a child   Stroke (HCC) 08/10/2011   Past Surgical History:  Procedure Laterality Date   CHOLECYSTECTOMY     HAND SURGERY     9 subsequent surgeries   REVERSE SHOULDER ARTHROPLASTY Left 01/27/2023   Procedure: REVERSE SHOULDER ARTHROPLASTY;  Surgeon: Melita Drivers, MD;  Location:  [facility];  Service: Orthopedics;  Laterality: Left;    rotator cuff surgery Right    Patient Active Problem List   Diagnosis Date Noted   Fatigue 07/18/2023   Snoring 05/23/2023   Preoperative clearance 01/19/2023   Callus 10/28/2022   Isolated memory impairment 07/23/2022   Lightheadedness 07/07/2022   Medication refill 05/11/2022   Pain of left eye 05/11/2022   Red eye 05/11/2022   Lung nodule seen on imaging study 04/26/2022   Lung nodule 04/26/2022   Abnormal chest x-ray 04/26/2022   DOE (dyspnea on exertion) 04/22/2022   Acute non-recurrent maxillary sinusitis 04/08/2022   High risk medication use 03/22/2022   Elevated hemoglobin (HCC) 03/22/2022   Former smoker 01/28/2022   Unexplained weight loss 01/28/2022   Bilateral edema of lower extremity 10/09/2021   Pain, dental 10/09/2021   Preventative health care 10/09/2021   Tremor 09/18/2021   Left hip pain 09/18/2021   Anxiety and depression 08/26/2021   Acute pain of left knee 08/26/2021   Grief 08/26/2021   Rotator cuff tear arthropathy 06/23/2021   Psychosexual dysfunction with inhibited sexual excitement 06/23/2021   Obesity 06/23/2021   Benign neoplasm of colon 06/23/2021   Carpal tunnel syndrome 06/23/2021   History of adenomatous polyp of colon 06/23/2021   Brachial neuritis 06/23/2021   Parkinson's disease (  HCC) 06/23/2021   Dysuria 03/18/2021   Low testosterone in male 03/18/2021   Hypokalemia 01/06/2021   Pain in left shoulder 11/05/2020   Bilateral primary osteoarthritis of knee 12/19/2019   Unilateral primary osteoarthritis, right knee 10/30/2019   Insomnia 05/06/2015   Essential hypertension 05/06/2015   Chronic back pain 05/06/2015   Type 2 diabetes mellitus without complication (HCC) 05/06/2015   Stroke (HCC) 05/06/2015   HLD (hyperlipidemia) 05/06/2015   BPH (benign prostatic hyperplasia) 05/06/2015   Esophageal reflux 05/06/2015   History of TB (tuberculosis) 05/06/2015   Iron  deficiency  04/10/2015    PCP: Wendee Lynwood HERO, NP  REFERRING PROVIDER: Bobbie Vicenta BROCKS, MD   REFERRING DIAG:  [phone] (ICD-10-CM) - Presence of right artificial shoulder joint  L SHOULDER PAIN S/P L RTSA   THERAPY DIAG:  Chronic left shoulder pain  Stiffness of left shoulder, not elsewhere classified  Muscle weakness (generalized)  Cervicalgia  Rationale for Evaluation and Treatment: Rehabilitation  ONSET DATE: January 27, 2023 S/P rTSA  SUBJECTIVE:                                                                                                                                                                                      SUBJECTIVE STATEMENT: Pt indicated having pain into Lt shoulder/upper arm upon arrival today.   Reported 8/10 upon arrival today.  Pt reported +3 somewhat better on global rating of change.   PERTINENT HISTORY: S/P rTSA 01/27/23, Chronic LBP, HTN, HLD, CVA (date unknown), DM2, Pagets disease of bone, action tremor  PAIN:  NPRS scale:  at worst 8/10, at best 2/10 Pain location: Lt neck, shoulder, Lt arm Pain description: tooth ache pain Aggravating factors: reaching, movement.  Relieving factors: ice, rest  PRECAUTIONS: None  WEIGHT BEARING RESTRICTIONS: No  FALLS:  Has patient fallen in last 6 months? No  LIVING ENVIRONMENT: Lives with: lives alone Lives in: House/apartment Stairs: NA Has following equipment at home: NA  OCCUPATION: Retired- [employer]  PLOF: Independent  PATIENT GOALS: play pickle ball again  Next MD visit: none scheduled  OBJECTIVE:   DIAGNOSTIC FINDINGS:  None on file from post surgery  PATIENT SURVEYS:  Patient-Specific Activity Scoring Scheme  0 represents "unable to perform." 10 represents "able to perform at prior level. 0 1 2 3 4 5 6 7 8 9  10 (Date and Score)   Activity Eval  12/06/23  01/12/2024  1. Lifting above head 2 4  2   2. Reaching arm in front of him  0  4 8  3. Close doors 4 5 3    4.     5.  Score 6/30 9/30 4.33 avg   Total score = sum of the activity scores/number of activities Minimum detectable change (90%CI) for average score = 2 points Minimum detectable change (90%CI) for single activity score = 3 points  COGNITION: Eval: Overall cognitive status: WFL     SENSATION: Eval: Light touch: Impaired C3-C7 on Lt side with patient reporting 70% sensation compared to Rt side.    POSTURE: Eval L shoulder rounded forward more than R, however both are rounded.   UPPER EXTREMITY ROM:   ROM Right eval Left eval Left 11/15/23 Left 11/24/23 Left 01/12/2024  Shoulder flexion 120 P: 90 A: 10 P:110 P:112 120 AROM in supine  Shoulder extension [facility] WFL     Shoulder abduction 160 65 P:90 P:90 95 PROM in supine   Shoulder adduction       Shoulder internal rotation Touches stomach Touches stomach     Shoulder external rotation 40 P: 4 A: 2 P:40 P:45 30 AROM in supine 45 deg abduction  Elbow flexion [facility] WFL     Elbow extension [facility]     Wrist flexion       Wrist extension       Wrist ulnar deviation       Wrist radial deviation       Wrist pronation       Wrist supination       (Blank rows = not tested)  UPPER EXTREMITY MMT:  MMT Right eval Left eval Left 12/06/23 Left 01/12/2024  Shoulder flexion 5 2 3+ 2/5 (shrug noted against gravity, limited by pain  Shoulder extension  3    Shoulder abduction 4 2 3+ Limited by pain  Shoulder adduction      Shoulder internal rotation 5 3 3+   Shoulder external rotation 5 3 3+ Limited by pain  Middle trapezius      Lower trapezius      Elbow flexion 4 4- 4   Elbow extension 4 4-    Wrist flexion      Wrist extension      Wrist ulnar deviation  3+    Wrist radial deviation  3+    Wrist pronation  4    Wrist supination  4    Grip strength (lbs)  Did not test with dynamometer;MMT 3+    (Blank rows = not tested)  SHOULDER SPECIAL TESTS: Eval Not tested  JOINT MOBILITY TESTING:  Eval: Inferior glide-  very stiff and end ROM A-P and P-A: normal at mid ROM  PALPATION:  Eval:  Non-tender throughout  TREATMENT:                                                                                                       DATE: 01/12/2024 Therex: Supine wand ER stretch 5 sec hold x 15 Lt  Standing green band rows c scapular retraction 2 x 15 Standing gh ext green band 2 x 10   TherActivity (to improve arm elevation) Pulleys flexion, scaption 3 mins each way with 2-3 sec hold in end range tolerance with eccentric lowering slowly focus Supine Lt arm flexion with Rt arm assistance x 10 in available range Supine wand flexion AROM x 10  Additional time for cues for intervention, rest periods due to fatigue/symptoms.   Manual Supine Lt shoulder posterior glide with mobilization c movement ER.  G3 inferior/superior    TREATMENT:                                                                                                       DATE: 01/10/2024 Therapeutic Exercise: UBE: level 2.5 3 minutes each direction Sitting table slides: flexion, abduction, ER all x 10 holding 5-10 sec Scapular squeezes: x 10 holding 5 sec Wall ladder x 10 flexion (# 20 with shoulder hiking) and abduction x 10 Abd isometrics standing x 10 holding 5 sec Supine shoulder flexion 2# bar 2 x 10 holding end range Supine cervical retraction x 10 holding 5 sec Supine scapular stabilization: circles bil directions x 20 shoulder at 80 deg  Side lying abd: x 10  Side lying ER x 10   Manual Therapy: Traction RUE with PT progressively increasing abducted position.  PROM right shoulder flexion, abduction, internal & external rotation.    TREATMENT:                                                                                                        DATE: 12/19/2023 Therapeutic Exercise: Pendulum flex/ext, abd/add & circles 10 reps 2 sets.   Standing leaning over table top RUE support: LUE flex/ext, abd/add & circles 10 reps each Supine chest press BUEs 1# bar 10 reps 2 sets.  Supine Bil. Shoulder flexion  1# bar 10 reps 2 sets with short lever arm (elbows bent). Supine cervical retraction  10 reps 5 sec hold & scapular retraction 5 sec hold 10 reps.   RUE isometric shoulder adduction 10 reps 5 sec hold.    Self:care: PT demo & verbal cues on supporting RUE with Yoga block and moving arm off block with adduction initially.  Pt verbalized understanding.    Manual Therapy: Traction RUE with PT progressively increasing abducted position.  PROM right shoulder flexion, abduction, internal & external rotation.    TREATMENT:                                                                                                       DATE: 12/16/2023 Therex: UBE fwd/back 3 mins each way UE/LE lvl 1.5 Supine wand AAROM 2-3 sec hold 2 x 10  Supine Lt ER wand 5 sec hold x 10   Manual: Percussive device to Lt upper trap, levator, Lt infraspinatus trigger points.   Estim IFC to Lt neck/shoulder to tolerance 10 mins   PATIENT EDUCATION: Education details: HEP, POC, pain and how hard to push with his exercises Person educated: Patient Education method: Explanation, Demonstration, Verbal cues, and Handouts Education comprehension: verbalized understanding, returned demonstration, and verbal cues required  HOME EXERCISE PROGRAM: Access Code: [email or web address] URL: [email or web address] Date: 11/29/2023 Prepared by: Redell Moose  Exercises - Supine Shoulder Flexion Extension AAROM with Dowel  - 3-5 x daily - 7 x weekly - 1 sets - 10 reps - 5 sec hold - Supine Shoulder External Rotation in 45 Degrees Abduction AAROM with Dowel  - 3-5 x daily - 7 x weekly - 1 sets - 10 reps - 5 sec hold - Circular Shoulder Pendulum with Table Support  - 3-5 x  daily - 7 x weekly - 1 sets - 15 reps - Seated Shoulder Abduction Towel Slide at Table Top  - 3-5 x daily - 6 x weekly - 1 sets - 10 reps - 5 sec hold - Seated Shoulder External Rotation PROM on Table  - 3-5 x daily - 6 x weekly - 1 sets - 10 reps - 5 hold - Standing Shoulder Row with Anchored Resistance  - 2 x daily - 6 x weekly - 2 sets - 10 reps - Shoulder extension with resistance - Neutral  - 2 x daily - 6 x weekly - 2 sets - 10 reps  ASSESSMENT:  CLINICAL IMPRESSION The patient has attended 15 visits over the course of treatment cycle.  Patient has reported overall improvement with global rating of change at +3 somewhat better.  See objective data above for updated information regarding current presentation.   At this time continued complaints of moderate to severe pain with limitations in both active and passive mobility of Lt shoulder with strength deficits.  There have been gains since eval but noted limitations still presention limiting functional return to PLOF.  Pt has reported improvements overall with therapy.  May continue to benefit from skilled PT services.  Would require additional visits approved by [place].    OBJECTIVE IMPAIRMENTS: decreased activity tolerance, decreased knowledge of condition, decreased mobility, decreased ROM, decreased strength, hypomobility,  increased edema, increased fascial restrictions, impaired perceived functional ability, increased muscle spasms, impaired sensation, impaired UE functional use, and pain.   ACTIVITY LIMITATIONS: carrying, lifting, bending, sitting, sleeping, bathing, dressing, reach over head, and hygiene/grooming  PARTICIPATION LIMITATIONS: meal prep, cleaning, laundry, driving, shopping, community activity, and yard work  PERSONAL FACTORS: Age, Past/current experiences, Time since onset of injury/illness/exacerbation, and 3+ comorbidities: see above are also affecting patient's functional outcome.   REHAB POTENTIAL: Fair Shoulder has  been stiff since June 2024 with patient also fearful of movement due to pain.  CLINICAL DECISION MAKING: Evolving/moderate complexity  EVALUATION COMPLEXITY: Moderate   GOALS: Goals reviewed with patient? Yes  SHORT TERM GOALS: (target date for Short term goals are 3 weeks 11/16/2023)  1.Patient will demonstrate independent use of home exercise program to maintain progress from in clinic treatments. Goal status: MET 11/15/23  LONG TERM GOALS: (target dates for all long term goals are 8 weeks  12/21/2023 )   1. Patient will demonstrate/report pain at worst less than or equal to 2/10  at rest and less than 5/10 with reaching  Goal status: Ongoing 12/19/2023   2. Patient will demonstrate independent use of home exercise program to facilitate ability to maintain/progress functional gains from skilled physical therapy services. Goal status: Ongoing 12/19/2023   3. Patient will demonstrate Patient specific functional scale avg > or = 15/30 to indicate reduced disability due to condition.  Goal status: Ongoing 12/19/2023   4.  Patient will demonstrate LUE MMT 4/5 throughout to facilitate lifting, reaching, carrying at [facility] in daily activity.   Goal status: Ongoing  12/19/2023   5.  Patient will demonstrate Left GH joint AROM WFL s symptoms to facilitate usual overhead reaching, self care, dressing at PLOF.    Goal status: Ongoing   12/19/2023   6.  Patient will demonstrate LUE grip strength </= 10lb different than RUE  Goal status: Ongoing  12/19/2023    PLAN:  PT FREQUENCY: 1-3x/week  PT DURATION: 8 weeks  PLANNED INTERVENTIONS: Can include 02853- PT Re-evaluation, 97110-Therapeutic exercises, 97530- Therapeutic activity, 97112- Neuromuscular re-education, 97535- Self Care, 97140- Manual therapy, [phone]- Gait training, [phone]- Orthotic Fit/training, [phone]- Canalith repositioning, [id number]- Aquatic Therapy, [phone]- Electrical stimulation (unattended), [phone]- Electrical stimulation (manual),  [id number] Physical performance testing, 97016- Vasopneumatic device, [id number]- Ultrasound, [id number]- Traction (mechanical), [id number]- Ionotophoresis 4mg /ml Dexamethasone , Patient/Family education, Balance training, Stair training, Taping, Dry Needling, Joint mobilization, Joint manipulation, Spinal manipulation, Spinal mobilization, Scar mobilization, Vestibular training, Visual/preceptual remediation/compensation, DME instructions, Cryotherapy, and Moist heat.  All performed as medically necessary.  All included unless contraindicated  PLAN FOR NEXT SESSION:  Will need recert upon more [facility] approval with extension of POC.  (Progress note for [facility] provided today) .  Anticipate possible MUA.  Re-eval upon return.    Ozell Silvan, PT, DPT, OCS, ATC 01/12/24  2:20 PM

## 2024-02-05 ENCOUNTER — Encounter: Payer: Self-pay | Admitting: Nurse Practitioner

## 2024-02-08 ENCOUNTER — Ambulatory Visit (INDEPENDENT_AMBULATORY_CARE_PROVIDER_SITE_OTHER): Admitting: Nurse Practitioner

## 2024-02-08 VITALS — BP 118/56 | HR 60 | Temp 97.9°F | Ht 68.0 in | Wt 167.4 lb

## 2024-02-08 DIAGNOSIS — R600 Localized edema: Secondary | ICD-10-CM

## 2024-02-08 DIAGNOSIS — M79672 Pain in left foot: Secondary | ICD-10-CM | POA: Diagnosis not present

## 2024-02-08 NOTE — Progress Notes (Signed)
 Acute Office Visit  Subjective:     Patient ID: James Pearson, male    DOB: 09/30/1945, 78 y.o.   MRN: [id number]  Chief Complaint  Patient presents with   Edema    Pt complains of both ankles and feet swollen for about 3 weeks. Pt states that the swelling has gone down. Pt has pain in heels that wake him up at night.     HPI Patient is in today for leg swelling and foot pain with a history of Hypertension, CVA, lung nodule, GERD, DM2, Parkinson's disease, BPH, HLD, bilateral lower extremity edema.  Of note patient is on amlodipine  5 mg daily.  Used to be on 10 mg daily but was reduced due to lower extremity edema in the past.  Foot pain: on the left heel. States that it hurts on the heel. States that the pain will wake him up and it is described as tingling and burning pain. States that has been going on for over 6 months. Statse that he has gabapentin at home and almost got into an accident due to that medication. He has tried a diabetic lotion that did not help.   Swelling: statse that it started approx 2.5 weeks ago. Stats that both of them were swollen. Statse that Sunday after he messaged me he noticed that on Monday and they went down. States that it stayed the same through the day. States that he would wake up and they would be swollen. States that he did notice some shob and palpitions.   Review of Systems  Constitutional:  Negative for chills and fever.  Respiratory:  Negative for shortness of breath.   Cardiovascular:  Positive for leg swelling. Negative for chest pain.  Musculoskeletal:  Positive for joint pain.        Objective:    BP (!) 118/56   Pulse 60   Temp 97.9 F (36.6 C) (Oral)   Ht 5' 8 (1.727 m)   Wt 167 lb 6.4 oz (75.9 kg)   SpO2 96%   BMI 25.45 kg/m  BP Readings from Last 3 Encounters:  02/08/24 (!) 118/56  12/07/23 110/62  10/31/23 (!) 148/72   Wt Readings from Last 3 Encounters:  02/08/24 167 lb 6.4 oz (75.9 kg)  12/07/23 172 lb 2 oz  (78.1 kg)  10/31/23 174 lb 12.8 oz (79.3 kg)   SpO2 Readings from Last 3 Encounters:  02/08/24 96%  12/07/23 96%  10/31/23 98%      Physical Exam Vitals and nursing note reviewed.  Constitutional:      Appearance: Normal appearance.  Cardiovascular:     Rate and Rhythm: Normal rate and regular rhythm.     Pulses:          Dorsalis pedis pulses are 2+ on the right side and 2+ on the left side.     Heart sounds: Normal heart sounds.  Pulmonary:     Effort: Pulmonary effort is normal.     Breath sounds: Normal breath sounds.  Musculoskeletal:     Right lower leg: Edema (1+ R>L) present.     Left lower leg: Edema present.       Legs:     Comments: 36cm right  36.25cm left  Non tender to palpation on the calcaneous   Neurological:     Mental Status: He is alert.     No results found for any visits on 02/08/24.      Assessment & Plan:   Problem List Items  Addressed This Visit       Other   Bilateral edema of lower extremity - Primary   Hx of the same while on amlodipine  10mg . Currently on amlodipine  5mg . R>L. Modified wells score:is a 2. If labs are negative we will obtain stat US  of bilateral lower extremities since patient is on exogenous hormones. Hx of bilaterl US  that were negative in 2023       Relevant Orders   Brain natriuretic peptide   Basic metabolic panel with GFR   Left foot pain   Sounds neuropathic in nature. Has tried gabapentin in the past with adverse drug event. He can use otc voltaren gel. Last B12 was WNL        No orders of the defined types were placed in this encounter.   Return if symptoms worsen or fail to improve, for As scheduled .  Adina Crandall, NP

## 2024-02-08 NOTE — Patient Instructions (Signed)
 Nice to see you today I will be in touch with the labs once I have them Try using voltaren gel over the counter on your heel

## 2024-02-08 NOTE — Assessment & Plan Note (Signed)
 Sounds neuropathic in nature. Has tried gabapentin in the past with adverse drug event. He can use otc voltaren gel. Last B12 was WNL

## 2024-02-08 NOTE — Assessment & Plan Note (Signed)
 Hx of the same while on amlodipine  10mg . Currently on amlodipine  5mg . R>L. Modified wells score:is a 2. If labs are negative we will obtain stat US  of bilateral lower extremities since patient is on exogenous hormones. Hx of bilaterl US  that were negative in 2023

## 2024-02-09 ENCOUNTER — Ambulatory Visit: Payer: Self-pay | Admitting: Nurse Practitioner

## 2024-02-09 DIAGNOSIS — R600 Localized edema: Secondary | ICD-10-CM

## 2024-02-09 LAB — BASIC METABOLIC PANEL WITH GFR
BUN: 11 mg/dL (ref 6–23)
CO2: 31 meq/L (ref 19–32)
Calcium: 8.9 mg/dL (ref 8.4–10.5)
Chloride: 102 meq/L (ref 96–112)
Creatinine, Ser: 1 mg/dL (ref 0.40–1.50)
GFR: 73 mL/min (ref >60.00)
Glucose, Bld: 101 mg/dL — ABNORMAL HIGH (ref 70–99)
Potassium: 3.8 meq/L (ref 3.5–5.1)
Sodium: 140 meq/L (ref 135–145)

## 2024-02-09 LAB — BRAIN NATRIURETIC PEPTIDE: Pro B Natriuretic peptide (BNP): 34 pg/mL (ref 0.0–100.0)

## 2024-02-11 ENCOUNTER — Ambulatory Visit (HOSPITAL_BASED_OUTPATIENT_CLINIC_OR_DEPARTMENT_OTHER)
Admission: RE | Admit: 2024-02-11 | Discharge: 2024-02-11 | Disposition: A | Discharge status: Home / Self Care | Source: Ambulatory Visit | Attending: Nurse Practitioner

## 2024-02-11 DIAGNOSIS — R600 Localized edema: Secondary | ICD-10-CM | POA: Diagnosis not present

## 2024-02-13 ENCOUNTER — Other Ambulatory Visit

## 2024-02-13 ENCOUNTER — Ambulatory Visit: Payer: Self-pay | Admitting: Nurse Practitioner

## 2024-02-22 ENCOUNTER — Other Ambulatory Visit (INDEPENDENT_AMBULATORY_CARE_PROVIDER_SITE_OTHER)

## 2024-02-22 ENCOUNTER — Ambulatory Visit: Admitting: Orthopaedic Surgery

## 2024-02-22 ENCOUNTER — Ambulatory Visit (INDEPENDENT_AMBULATORY_CARE_PROVIDER_SITE_OTHER): Admitting: Orthopaedic Surgery

## 2024-02-22 ENCOUNTER — Encounter: Payer: Self-pay | Admitting: Orthopaedic Surgery

## 2024-02-22 DIAGNOSIS — G8929 Other chronic pain: Secondary | ICD-10-CM | POA: Diagnosis not present

## 2024-02-22 DIAGNOSIS — M25512 Pain in left shoulder: Secondary | ICD-10-CM

## 2024-02-22 LAB — HM DIABETES EYE EXAM

## 2024-02-22 NOTE — Progress Notes (Signed)
 Office Visit Note   Patient: James Pearson           Date of Birth: 1946/02/13           MRN: [id number] Visit Date: 02/22/2024              Requested by: Wendee Lynwood HERO, NP [address] [place],  [place] [address] PCP: Wendee Lynwood HERO, NP   Assessment & Plan: Visit Diagnoses:  1. Chronic left shoulder pain     Plan: History of Present Illness James Pearson is a 78 year old male who presents with continued shoulder pain following a shoulder replacement.  Underwent a reverse in 2024 by Dr. Melita.  Since the shoulder replacement in June 2024, he experiences persistent pain and stiffness in the shoulder, with difficulty lifting his arm. Efforts to improve shoulder mobility have been limited, with any progress quickly lost. He has not resumed physical therapy since March 17, 2023.  Previous diagnostic workup includes x-rays and a bone scan in October 2024. Blood work for infection has been conducted by both his previous physician and the [facility]. Injections in both his knee and shoulder have not alleviated symptoms.  Physical Exam MUSCULOSKELETAL: Shoulder surgical scar healed with no signs of infection. External rotation approximately -5 degrees. Abduction limited to 10-15 degrees. Forward flexion limited to 35-45 degrees.  Assessment and Plan Status post left reverse shoulder replacement with ongoing pain and stiffness Chronic pain and stiffness in shoulder post-replacement. Limited ROM. X-rays normal. Differential includes component loosening or infection. Previous tests negative for infection. Further evaluation required. - Order CT scan of shoulder to assess component positioning and check for loosening. - Refer to Dr. Henri for follow-up after CT scan.  Follow-Up Instructions: No follow-ups on file.   Orders:  Orders Placed This Encounter  Procedures   XR Shoulder Left   No orders of the defined types were placed in this encounter.     Procedures: No procedures  performed   Clinical Data: No additional findings.   Subjective: Chief Complaint  Patient presents with   Left Shoulder - Pain    HPI  Review of Systems  Constitutional: Negative.   HENT: Negative.    Eyes: Negative.   Respiratory: Negative.    Cardiovascular: Negative.   Gastrointestinal: Negative.   Endocrine: Negative.   Genitourinary: Negative.   Skin: Negative.   Allergic/Immunologic: Negative.   Neurological: Negative.   Hematological: Negative.   Psychiatric/Behavioral: Negative.    All other systems reviewed and are negative.    Objective: Vital Signs: There were no vitals taken for this visit.  Physical Exam Vitals and nursing note reviewed.  Constitutional:      Appearance: He is well-developed.  HENT:     Head: Normocephalic and atraumatic.  Eyes:     Pupils: Pupils are equal, round, and reactive to light.  Pulmonary:     Effort: Pulmonary effort is normal.  Abdominal:     Palpations: Abdomen is soft.  Musculoskeletal:        General: Normal range of motion.     Cervical back: Neck supple.  Skin:    General: Skin is warm.  Neurological:     Mental Status: He is alert and oriented to person, place, and time.  Psychiatric:        Behavior: Behavior normal.        Thought Content: Thought content normal.        Judgment: Judgment normal.  Ortho Exam  Specialty Comments:  No specialty comments available.  Imaging: XR Shoulder Left Result Date: 02/22/2024 X-rays of the left shoulder show prior reverse shoulder arthroplasty in good position without any obvious acute complications.    PMFS History: Patient Active Problem List   Diagnosis Date Noted   Left foot pain 02/08/2024   Fatigue 07/18/2023   Snoring 05/23/2023   Preoperative clearance 01/19/2023   Callus 10/28/2022   Isolated memory impairment 07/23/2022   Lightheadedness 07/07/2022   Medication refill 05/11/2022   Pain of left eye 05/11/2022   Red eye 05/11/2022    Lung nodule seen on imaging study 04/26/2022   Lung nodule 04/26/2022   Abnormal chest x-ray 04/26/2022   DOE (dyspnea on exertion) 04/22/2022   Acute non-recurrent maxillary sinusitis 04/08/2022   High risk medication use 03/22/2022   Elevated hemoglobin (HCC) 03/22/2022   Former smoker 01/28/2022   Unexplained weight loss 01/28/2022   Bilateral edema of lower extremity 10/09/2021   Pain, dental 10/09/2021   Preventative health care 10/09/2021   Tremor 09/18/2021   Left hip pain 09/18/2021   Anxiety and depression 08/26/2021   Acute pain of left knee 08/26/2021   Grief 08/26/2021   Rotator cuff tear arthropathy 06/23/2021   Psychosexual dysfunction with inhibited sexual excitement 06/23/2021   Obesity 06/23/2021   Benign neoplasm of colon 06/23/2021   Carpal tunnel syndrome 06/23/2021   History of adenomatous polyp of colon 06/23/2021   Brachial neuritis 06/23/2021   Parkinson's disease (HCC) 06/23/2021   Dysuria 03/18/2021   Low testosterone in male 03/18/2021   Hypokalemia 01/06/2021   Pain in left shoulder 11/05/2020   Bilateral primary osteoarthritis of knee 12/19/2019   Unilateral primary osteoarthritis, right knee 10/30/2019   Insomnia 05/06/2015   Essential hypertension 05/06/2015   Chronic back pain 05/06/2015   Type 2 diabetes mellitus without complication (HCC) 05/06/2015   Stroke (HCC) 05/06/2015   HLD (hyperlipidemia) 05/06/2015   BPH (benign prostatic hyperplasia) 05/06/2015   Esophageal reflux 05/06/2015   History of TB (tuberculosis) 05/06/2015   Iron  deficiency 04/10/2015   Past Medical History:  Diagnosis Date   Arthritis    Diabetes mellitus without complication (HCC)    GERD (gastroesophageal reflux disease)    Hepatitis    Hypertension    Parkinson's disease (HCC)    Positive TB test    as a child   Stroke (HCC) 08/10/2011    Family History  Problem Relation Age of Onset   Heart disease Mother    Hypertension Mother    Tremor Father     COPD Sister    Heart disease Sister    Hypertension Sister     Past Surgical History:  Procedure Laterality Date   CHOLECYSTECTOMY     HAND SURGERY     9 subsequent surgeries   REVERSE SHOULDER ARTHROPLASTY Left 01/27/2023   Procedure: REVERSE SHOULDER ARTHROPLASTY;  Surgeon: Melita Drivers, MD;  Location: [facility];  Service: Orthopedics;  Laterality: Left;    rotator cuff surgery Right    Social History   Occupational History   Not on file  Tobacco Use   Smoking status: Former    Current packs/day: 0.00    Types: Cigarettes    Quit date: 08/09/1990    Years since quitting: 33.5   Smokeless tobacco: Never  Vaping Use   Vaping status: Never Used  Substance and Sexual Activity   Alcohol use: No    Alcohol/week: 0.0 standard drinks of alcohol  Drug use: No   Sexual activity: Yes

## 2024-02-27 ENCOUNTER — Ambulatory Visit
Admission: RE | Admit: 2024-02-27 | Discharge: 2024-02-27 | Disposition: A | Discharge status: Home / Self Care | Source: Ambulatory Visit | Attending: Orthopaedic Surgery | Admitting: Orthopaedic Surgery

## 2024-02-27 DIAGNOSIS — Z96612 Presence of left artificial shoulder joint: Secondary | ICD-10-CM | POA: Diagnosis not present

## 2024-02-27 DIAGNOSIS — M19012 Primary osteoarthritis, left shoulder: Secondary | ICD-10-CM | POA: Diagnosis not present

## 2024-02-27 DIAGNOSIS — G8929 Other chronic pain: Secondary | ICD-10-CM

## 2024-02-28 ENCOUNTER — Other Ambulatory Visit

## 2024-03-09 ENCOUNTER — Encounter (HOSPITAL_BASED_OUTPATIENT_CLINIC_OR_DEPARTMENT_OTHER): Payer: Self-pay

## 2024-03-09 ENCOUNTER — Other Ambulatory Visit (HOSPITAL_BASED_OUTPATIENT_CLINIC_OR_DEPARTMENT_OTHER): Payer: Self-pay

## 2024-03-09 ENCOUNTER — Ambulatory Visit (HOSPITAL_BASED_OUTPATIENT_CLINIC_OR_DEPARTMENT_OTHER): Admitting: Orthopaedic Surgery

## 2024-03-09 DIAGNOSIS — G8929 Other chronic pain: Secondary | ICD-10-CM | POA: Diagnosis not present

## 2024-03-09 DIAGNOSIS — M25512 Pain in left shoulder: Secondary | ICD-10-CM | POA: Diagnosis not present

## 2024-03-09 MED ORDER — ASPIRIN 325 MG PO TBEC
325.0000 mg | DELAYED_RELEASE_TABLET | Freq: Every day | ORAL | 0 refills | Status: AC
  Filled 2024-03-09: qty 14, 14d supply, fill #0

## 2024-03-09 MED ORDER — OXYCODONE HCL 5 MG PO TABS
5.0000 mg | ORAL_TABLET | ORAL | 0 refills | Status: AC | PRN
  Filled 2024-03-09 (×2): qty 10, 2d supply, fill #0

## 2024-03-09 MED ORDER — ACETAMINOPHEN 500 MG PO TABS
500.0000 mg | ORAL_TABLET | Freq: Three times a day (TID) | ORAL | 0 refills | Status: AC
  Filled 2024-03-09: qty 30, 10d supply, fill #0

## 2024-03-09 NOTE — Progress Notes (Signed)
 Chief Complaint: Left shoulder pain     History of Present Illness:    James Pearson is a 78 y.o. male presents today as a referral from Dr. Jerri.  He is status post left reverse shoulder arthroplasty in 2024 with Dr. Melita.  Since that time he has had limited overhead range of motion and pain at rest.  He did get inflammatory labs which were obtained at his [place]  [facility] which did not show any evidence of elevation.  He is here today for further discussion.  He has trialed physical therapy postop without any relief.    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   Stroke (HCC) 08/10/2011   Past Surgical History:  Procedure Laterality Date   CHOLECYSTECTOMY     HAND SURGERY     9 subsequent surgeries   REVERSE SHOULDER ARTHROPLASTY Left 01/27/2023   Procedure: REVERSE SHOULDER ARTHROPLASTY;  Surgeon: Melita Drivers, MD;  Location: [facility];  Service: Orthopedics;  Laterality: Left;    rotator cuff surgery Right    Social History   Socioeconomic History   Marital status: Widowed    Spouse name: Not on file   Number of children: Not on file   Years of education: Not on file   Highest education level: Not on file  Occupational History   Not on file  Tobacco Use   Smoking status: Former    Current packs/day: 0.00    Types: Cigarettes    Quit date: 08/09/1990    Years since quitting: 33.6   Smokeless tobacco: Never  Vaping Use   Vaping status: Never Used  Substance and Sexual Activity   Alcohol use: No    Alcohol/week: 0.0 standard drinks of alcohol   Drug use: No   Sexual activity: Yes  Other Topics Concern   Not on file  Social History Narrative   Caffeine rare.  So lost wife recently back in 05/20/2021 (cancer).   Veteran.  Working : retired.     Social Drivers of Corporate investment banker Strain: Low Risk  (03/24/2023)   Overall Financial Resource Strain (CARDIA)    Difficulty of Paying Living Expenses: Not hard at all  Food Insecurity: No Food Insecurity (03/24/2023)   Hunger Vital Sign    Worried About Running Out of Food in the Last Year: Never true    Ran Out of Food in the Last Year: Never true  Transportation Needs: No Transportation Needs (03/24/2023)   PRAPARE - Administrator, Civil Service (Medical): No    Lack of Transportation (Non-Medical): No  Physical Activity: Inactive (03/24/2023)   Exercise Vital Sign    Days of Exercise per Week: 0 days    Minutes of Exercise per Session: 0 min  Stress: No Stress Concern Present (03/24/2023)   [employer] of Occupational Health - Occupational Stress Questionnaire    Feeling of Stress : Not at all  Social Connections: Socially Integrated (03/24/2023)   Social Connection and Isolation Panel    Frequency of Communication with Friends and Family: More than three times a week    Frequency of Social Gatherings with Friends and Family: More than three times a week  Attends Religious Services: More than 4 times per year    Active Member of Clubs or Organizations: Yes    Attends Engineer, structural: More than 4 times per year    Marital Status: Living with partner   Family History  Problem Relation Age of Onset   Heart disease Mother    Hypertension Mother    Tremor Father    COPD Sister    Heart disease Sister    Hypertension Sister    Allergies  Allergen Reactions   Omeprazole Anaphylaxis   Gabapentin Other (See Comments)    Unknown   Ibuprofen Nausea And Vomiting    Other reaction(s): Low blood pressure, NAUSEA,VOMITING, upset stomach   Ketorolac Tromethamine     Other reaction(s): Low blood pressure   Meloxicam Nausea And Vomiting   Morphine Nausea And Vomiting   Nsaids Nausea And Vomiting   Oxybutynin  Chloride     Other reaction(s): Xerostomia, Dry eyes, Dry skin, Xerostomia, Dry eyes, Dry skin, Xerostomia, Dry eyes, Dry skin   Propranolol    Current Outpatient Medications  Medication Sig Dispense Refill   acetaminophen  (TYLENOL ) 500 MG tablet Take 1 tablet (500 mg total) by mouth every 8 (eight) hours for 10 days. 30 tablet 0   aspirin  EC 325 MG tablet Take 1 tablet (325 mg total) by mouth daily. 14 tablet 0   oxyCODONE  (ROXICODONE ) 5 MG immediate release tablet Take 1 tablet (5 mg total) by mouth every 4 (four) hours as needed for severe pain (pain score 7-10) or breakthrough pain. 10 tablet 0   amLODipine  (NORVASC ) 5 MG tablet TAKE 1 TABLET BY MOUTH DAILY 100 tablet 2   aspirin  81 MG chewable tablet Chew 81 mg by mouth in the morning.     atorvastatin  (LIPITOR) 80 MG tablet TAKE 1 TABLET BY MOUTH ONCE  DAILY 100 tablet 2   Berberine Chloride (BERBERINE HCI PO) Take 1,200 mg by mouth in the morning and at bedtime.     CALCIUM -VITAMIN D  PO Take by mouth. Calcium  600 mg and vitamin d  400 mg     cyclobenzaprine  (FLEXERIL ) 10 MG tablet Take 1 tablet (10 mg total) by mouth 3 (three) times daily as needed for muscle spasms. 30 tablet 1   docusate sodium (COLACE) 100 MG capsule Take 100 mg by mouth daily.     enalapril  (VASOTEC ) 20 MG tablet TAKE 2 TABLETS BY MOUTH ONCE  DAILY 200 tablet 2   Fluocinolone  Acetonide Body 0.01 % OIL Apply to scalp, face and ears QD to BID 120 mL 5   hydrochlorothiazide  (HYDRODIURIL ) 12.5 MG tablet TAKE 1 TABLET BY MOUTH DAILY 100 tablet 2   Meclizine HCl 25 MG CHEW Chew 25 mg by mouth daily as needed.     metFORMIN  (GLUCOPHAGE -XR) 500 MG 24 hr tablet TAKE 1 TABLET BY MOUTH DAILY  WITH BREAKFAST 100 tablet 2   Multiple Vitamins-Minerals (MULTIVITAMIN WITH MINERALS) tablet Take 1 tablet by mouth daily. Centrum silver 50+     naloxone (NARCAN) nasal spray 4 mg/0.1 mL Place 1 spray into the nose once.     ondansetron  (ZOFRAN ) 4 MG tablet Take 1 tablet (4 mg total) by  mouth every 8 (eight) hours as needed for nausea or vomiting. 10 tablet 0   oxyCODONE  (OXY IR/ROXICODONE ) 5 MG immediate release tablet Take 5-10 mg by mouth every 6 (six) hours as needed.     potassium chloride  SA (KLOR-CON  M) 20 MEQ tablet Take 2 tablets (40 mEq total) by  mouth once for 1 dose. 2 tablet 0   sertraline  (ZOLOFT ) 50 MG tablet Take 1 tablet (50 mg total) by mouth daily. 90 tablet 1   sildenafil (VIAGRA) 100 MG tablet Take 50-100 mg by mouth daily as needed for erectile dysfunction.     Specialty Vitamins Products (PROSTATE PO) Take 250 mg by mouth at bedtime. supra beta prostate     tamsulosin  (FLOMAX ) 0.4 MG CAPS capsule TAKE 2 CAPSULES BY MOUTH DAILY (Patient not taking: Reported on 02/08/2024) 200 capsule 2   testosterone cypionate (DEPOTESTOSTERONE CYPIONATE) 200 MG/ML injection Inject 1 mL into the muscle every 28 (twenty-eight) days.     tobramycin-dexamethasone  (TOBRADEX) ophthalmic solution Place 1 drop into the left eye daily as needed (eye infection).     traZODone  (DESYREL ) 50 MG tablet TAKE 1 TABLET BY MOUTH AT  BEDTIME AS NEEDED FOR SLEEP 90 tablet 3   TURMERIC PO Take 1 capsule by mouth daily as needed (Knee pain).     No current facility-administered medications for this visit.   No results found.  Review of Systems:   A ROS was performed including pertinent positives and negatives as documented in the HPI.  Physical Exam :   Constitutional: NAD and appears stated age Neurological: Alert and oriented Psych: Appropriate affect and cooperative There were no vitals taken for this visit.   Comprehensive Musculoskeletal Exam:    Left shoulder with active forward elevation to 60 degrees.  External rotation at the side is to 10 degrees.  Internal rotation is to front pocket.  He does fire all 3 heads of the deltoid without diminished sensation in the axillary distribution.  Remainder of distal neurosensory exam is intact   Imaging:   Xray (2 views left shoulder, CT  scan left shoulder): There is notching of the inferior glenoid    I personally reviewed and interpreted the radiographs.   Assessment and Plan:   78 y.o. male status post left reverse shoulder arthroplasty in 2024 with subsequent notching of the inferior glenoid.  Overall his overhead motion is quite limited.  I did discuss that I do believe ultimately he may benefit from revision of the glenoid component for distalization as well as augmentation in order to promote a more optimal reverse shoulder angle.  I did discuss risks and benefits associated with this as well as the associated recovery timeframe.  After discussion he would like to proceed  -Plan for revision glenoid component left reverse shoulder arthroplasty   After a lengthy discussion of treatment options, including risks, benefits, alternatives, complications of surgical and nonsurgical conservative options, the patient elected surgical repair.   The patient  is aware of the material risks  and complications including, but not limited to injury to adjacent structures, neurovascular injury, infection, numbness, bleeding, implant failure, thermal burns, stiffness, persistent pain, failure to heal, disease transmission from allograft, need for further surgery, dislocation, anesthetic risks, blood clots, risks of death,and others. The probabilities of surgical success and failure discussed with patient given their particular co-morbidities.The time and nature of expected rehabilitation and recovery was discussed.The patient's questions were all answered preoperatively.  No barriers to understanding were noted. I explained the natural history of the disease process and Rx rationale.  I explained to the patient what I considered to be reasonable expectations given their personal situation.  The final treatment plan was arrived at through a shared patient decision making process model.    I personally saw and evaluated the patient, and  participated in  the management and treatment plan.  Elspeth Parker, MD Attending Physician, Orthopedic Surgery  This document was dictated using Dragon voice recognition software. A reasonable attempt at proof reading has been made to minimize errors.

## 2024-03-21 ENCOUNTER — Telehealth: Payer: Self-pay | Admitting: Orthopaedic Surgery

## 2024-03-21 ENCOUNTER — Telehealth: Payer: Self-pay | Admitting: Nurse Practitioner

## 2024-03-21 NOTE — Telephone Encounter (Signed)
 Patient is having second thoughts about scheduling surgery. He has become very nervous about it, and would like to talk to doctor again in reference to surgery. Patient is requesting to hold on scheduling at least until he talks to doctor again. Please call patient to advise.

## 2024-03-21 NOTE — Telephone Encounter (Signed)
 Needs scheduled with me for a pre-operative clearance

## 2024-03-22 ENCOUNTER — Ambulatory Visit: Admitting: Podiatry

## 2024-03-22 NOTE — Telephone Encounter (Signed)
 That is fine with me.

## 2024-03-22 NOTE — Telephone Encounter (Signed)
 Spoke to pt, pt states he told the surgeon he wanted to hold off on the procedure. Pt states he slightly still recovering from his recent procedure. Pt states if its fine with Cable, he would like cb our office to schedule when he's ready to proceed.

## 2024-03-27 ENCOUNTER — Telehealth (HOSPITAL_BASED_OUTPATIENT_CLINIC_OR_DEPARTMENT_OTHER): Payer: Self-pay | Admitting: Orthopaedic Surgery

## 2024-03-27 NOTE — Telephone Encounter (Signed)
 Left a message for patient to call the office to set up an appointment to talk about pre op surgery

## 2024-04-02 ENCOUNTER — Ambulatory Visit (INDEPENDENT_AMBULATORY_CARE_PROVIDER_SITE_OTHER): Payer: No Typology Code available for payment source

## 2024-04-02 VITALS — BP 119/62 | Ht 68.0 in | Wt 167.0 lb

## 2024-04-02 DIAGNOSIS — Z0000 Encounter for general adult medical examination without abnormal findings: Secondary | ICD-10-CM | POA: Diagnosis not present

## 2024-04-02 DIAGNOSIS — E119 Type 2 diabetes mellitus without complications: Secondary | ICD-10-CM | POA: Diagnosis not present

## 2024-04-02 DIAGNOSIS — Z5986 Financial insecurity: Secondary | ICD-10-CM | POA: Diagnosis not present

## 2024-04-02 NOTE — Patient Instructions (Signed)
 James Pearson , Thank you for taking time out of your busy schedule to complete your Annual Wellness Visit with me. I enjoyed our conversation and look forward to speaking with you again next year. I, as well as your care team,  appreciate your ongoing commitment to your health goals. Please review the following plan we discussed and let me know if I can assist you in the future. Your Game plan/ To Do List    Referrals: If you haven't heard from the office you've been referred to, please reach out to them at the phone provided.   Follow up Visits: We will see or speak with you next year for your Next Medicare AWV with our clinical staff Have you seen your provider in the last 6 months (3 months if uncontrolled diabetes)? Yes  Clinician Recommendations:  Aim for 30 minutes of exercise or brisk walking, 6-8 glasses of water , and 5 servings of fruits and vegetables each day.       This is a list of the screenings recommended for you:  Health Maintenance  Topic Date Due   Hepatitis C Screening  Never done   Yearly kidney health urinalysis for diabetes  10/10/2022   Complete foot exam   10/10/2022   Flu Shot  03/09/2024   Hemoglobin A1C  05/02/2024   Yearly kidney function blood test for diabetes  02/07/2025   Eye exam for diabetics  02/21/2025   Medicare Annual Wellness Visit  04/02/2025   DTaP/Tdap/Td vaccine (3 - Tdap) 03/26/2026   Pneumococcal Vaccine for age over 18  Completed   Zoster (Shingles) Vaccine  Completed   HPV Vaccine  Aged Out   Meningitis B Vaccine  Aged Out   Hepatitis B Vaccine  Discontinued   Colon Cancer Screening  Discontinued   COVID-19 Vaccine  Discontinued    Advanced directives: (Copy Requested) Please bring a copy of your health care power of attorney and living will to the office to be added to your chart at your convenience. You can mail to [facility] [address] [place], [place] [address] or email to [email or web address] Advance  Care Planning is important because it:  [x]  Makes sure you receive the medical care that is consistent with your values, goals, and preferences  [x]  It provides guidance to your family and loved ones and reduces their decisional burden about whether or not they are making the right decisions based on your wishes.  Follow the link provided in your after visit summary or read over the paperwork we have mailed to you to help you started getting your Advance Directives in place. If you need assistance in completing these, please reach out to us  so that we can help you!  See attachments for Preventive Care and Fall Prevention Tips.

## 2024-04-02 NOTE — Progress Notes (Signed)
 Because this visit was a virtual/telehealth visit,  certain criteria was not obtained, such a blood pressure, CBG if applicable, and timed get up and go. Any medications not marked as taking were not mentioned during the medication reconciliation part of the visit. Any vitals not documented were not able to be obtained due to this being a telehealth visit or patient was unable to self-report a recent blood pressure reading due to a lack of equipment at home via telehealth. Vitals that have been documented are verbally provided by the patient.  This visit was performed by a medical professional under my direct supervision. I was immediately available for consultation/collaboration. I have reviewed and agree with the Annual Wellness Visit documentation.  Subjective:   James Pearson is a 78 y.o. who presents for a Medicare Wellness preventive visit.  As a reminder, Annual Wellness Visits don't include a physical exam, and some assessments may be limited, especially if this visit is performed virtually. We may recommend an in-person follow-up visit with your provider if needed.  Visit Complete: Virtual I connected with  James Pearson on 04/02/24 by a audio enabled telemedicine application and verified that I am speaking with the correct person using two identifiers.  Patient Location: Home  Provider Location: Home Office  I discussed the limitations of evaluation and management by telemedicine. The patient expressed understanding and agreed to proceed.  Vital Signs: Because this visit was a virtual/telehealth visit, some criteria may be missing or patient reported. Any vitals not documented were not able to be obtained and vitals that have been documented are patient reported.  VideoDeclined- This patient declined Librarian, academic. Therefore the visit was completed with audio only.  Persons Participating in Visit: Patient.  AWV Questionnaire: No: Patient Medicare  AWV questionnaire was not completed prior to this visit.  Cardiac Risk Factors include: advanced age (>12men, >39 women);male gender;diabetes mellitus;hypertension     Objective:    Today's Vitals   04/02/24 1258  BP: 119/62  Weight: 167 lb (75.8 kg)  Height: 5' 8 (1.727 m)   Body mass index is 25.39 kg/m.     04/02/2024    1:07 PM 10/26/2023    4:38 PM 03/24/2023    1:55 PM 01/27/2023   10:37 AM 01/12/2023    2:39 PM 03/12/2022    1:14 PM 12/14/2021    2:03 PM  Advanced Directives  Does Patient Have a Medical Advance Directive? Yes Yes Yes Yes Yes Yes No  Type of Estate agent of [place];Living will Healthcare Power of [place];Living will Healthcare Power of [place];Living will Living will Healthcare Power of [place];Living will Living will;Healthcare Power of Attorney   Does patient want to make changes to medical advance directive? No - Patient declined   No - Patient declined  No - Patient declined   Copy of Healthcare Power of Attorney in Chart? No - copy requested  No - copy requested No - copy requested  No - copy requested   Would patient like information on creating a medical advance directive?   No - Patient declined    No - Patient declined    Current Medications (verified) Outpatient Encounter Medications as of 04/02/2024  Medication Sig   amLODipine  (NORVASC ) 5 MG tablet TAKE 1 TABLET BY MOUTH DAILY   aspirin  81 MG chewable tablet Chew 81 mg by mouth in the morning.   aspirin  EC 325 MG tablet Take 1 tablet (325 mg total) by mouth daily.   atorvastatin  (LIPITOR)  80 MG tablet TAKE 1 TABLET BY MOUTH ONCE  DAILY   Berberine Chloride (BERBERINE HCI PO) Take 1,200 mg by mouth in the morning and at bedtime.   CALCIUM -VITAMIN D  PO Take by mouth. Calcium  600 mg and vitamin d  400 mg   cyclobenzaprine  (FLEXERIL ) 10 MG tablet Take 1 tablet (10 mg total) by mouth 3 (three) times daily as needed for muscle spasms.   docusate sodium (COLACE) 100 MG capsule Take  100 mg by mouth daily.   enalapril  (VASOTEC ) 20 MG tablet TAKE 2 TABLETS BY MOUTH ONCE  DAILY   Fluocinolone  Acetonide Body 0.01 % OIL Apply to scalp, face and ears QD to BID   hydrochlorothiazide  (HYDRODIURIL ) 12.5 MG tablet TAKE 1 TABLET BY MOUTH DAILY   Meclizine HCl 25 MG CHEW Chew 25 mg by mouth daily as needed.   metFORMIN  (GLUCOPHAGE -XR) 500 MG 24 hr tablet TAKE 1 TABLET BY MOUTH DAILY  WITH BREAKFAST   Multiple Vitamins-Minerals (MULTIVITAMIN WITH MINERALS) tablet Take 1 tablet by mouth daily. Centrum silver 50+   naloxone (NARCAN) nasal spray 4 mg/0.1 mL Place 1 spray into the nose once.   ondansetron  (ZOFRAN ) 4 MG tablet Take 1 tablet (4 mg total) by mouth every 8 (eight) hours as needed for nausea or vomiting.   oxyCODONE  (OXY IR/ROXICODONE ) 5 MG immediate release tablet Take 5-10 mg by mouth every 6 (six) hours as needed.   oxyCODONE  (ROXICODONE ) 5 MG immediate release tablet Take 1 tablet (5 mg total) by mouth every 4 (four) hours as needed for severe pain (pain score 7-10) or breakthrough pain.   potassium chloride  SA (KLOR-CON  M) 20 MEQ tablet Take 2 tablets (40 mEq total) by mouth once for 1 dose.   sertraline  (ZOLOFT ) 50 MG tablet Take 1 tablet (50 mg total) by mouth daily.   sildenafil (VIAGRA) 100 MG tablet Take 50-100 mg by mouth daily as needed for erectile dysfunction.   Specialty Vitamins Products (PROSTATE PO) Take 250 mg by mouth at bedtime. supra beta prostate   tamsulosin  (FLOMAX ) 0.4 MG CAPS capsule TAKE 2 CAPSULES BY MOUTH DAILY   testosterone cypionate (DEPOTESTOSTERONE CYPIONATE) 200 MG/ML injection Inject 1 mL into the muscle every 28 (twenty-eight) days.   tobramycin-dexamethasone  (TOBRADEX) ophthalmic solution Place 1 drop into the left eye daily as needed (eye infection).   traZODone  (DESYREL ) 50 MG tablet TAKE 1 TABLET BY MOUTH AT  BEDTIME AS NEEDED FOR SLEEP   TURMERIC PO Take 1 capsule by mouth daily as needed (Knee pain).   No facility-administered  encounter medications on file as of 04/02/2024.    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   Stroke (HCC) 08/10/2011   Past Surgical History:  Procedure Laterality Date   CHOLECYSTECTOMY     HAND SURGERY     9 subsequent surgeries   REVERSE SHOULDER ARTHROPLASTY Left 01/27/2023   Procedure: REVERSE SHOULDER ARTHROPLASTY;  Surgeon: Melita Drivers, MD;  Location: [facility];  Service: Orthopedics;  Laterality: Left;    rotator cuff surgery Right    Family History  Problem Relation Age of Onset   Heart disease Mother    Hypertension Mother  Tremor Father    COPD Sister    Heart disease Sister    Hypertension Sister    Social History   Socioeconomic History   Marital status: Widowed    Spouse name: Not on file   Number of children: Not on file   Years of education: Not on file   Highest education level: Not on file  Occupational History   Not on file  Tobacco Use   Smoking status: Former    Current packs/day: 0.00    Types: Cigarettes    Quit date: 08/09/1990    Years since quitting: 33.6   Smokeless tobacco: Never  Vaping Use   Vaping status: Never Used  Substance and Sexual Activity   Alcohol use: No    Alcohol/week: 0.0 standard drinks of alcohol   Drug use: No   Sexual activity: Yes  Other Topics Concern   Not on file  Social History Narrative   Caffeine rare.  So lost wife recently back in 05/20/2021 (cancer).  Veteran.  Working : retired.     Social Drivers of Corporate investment banker Strain: Low Risk  (04/02/2024)   Overall Financial Resource Strain (CARDIA)    Difficulty of Paying Living Expenses:  Not hard at all  Food Insecurity: No Food Insecurity (04/02/2024)   Hunger Vital Sign    Worried About Running Out of Food in the Last Year: Never true    Ran Out of Food in the Last Year: Never true  Transportation Needs: No Transportation Needs (04/02/2024)   PRAPARE - Administrator, Civil Service (Medical): No    Lack of Transportation (Non-Medical): No  Physical Activity: Inactive (04/02/2024)   Exercise Vital Sign    Days of Exercise per Week: 0 days    Minutes of Exercise per Session: 0 min  Stress: No Stress Concern Present (04/02/2024)   [employer] of Occupational Health - Occupational Stress Questionnaire    Feeling of Stress: Not at all  Social Connections: Socially Integrated (04/02/2024)   Social Connection and Isolation Panel    Frequency of Communication with Friends and Family: More than three times a week    Frequency of Social Gatherings with Friends and Family: More than three times a week    Attends Religious Services: More than 4 times per year    Active Member of [employer] or Organizations: Yes    Attends Engineer, structural: More than 4 times per year    Marital Status: Living with partner    Tobacco Counseling Counseling given: Not Answered    Clinical Intake:  Pre-visit preparation completed: Yes  Pain : No/denies pain     BMI - recorded: 25.39 Nutritional Status: BMI 25 -29 Overweight Nutritional Risks: None Diabetes: No  Lab Results  Component Value Date   HGBA1C 6.8 (A) 10/31/2023   HGBA1C 6.8 02/21/2023   HGBA1C 6.2 (H) 01/12/2023     How often do you need to have someone help you when you read instructions, pamphlets, or other written materials from your doctor or pharmacy?: 1 - Never What is the last grade level you completed in school?: some college  Interpreter Needed?: No  Information entered by :: Kaylen Motl,cma   Activities of Daily Living     04/02/2024    1:05 PM  In your present state of  health, do you have any difficulty performing the following activities:  Hearing? 0  Vision? 0  Difficulty concentrating or making decisions? 1  Walking  or climbing stairs? 0  Dressing or bathing? 0  Doing errands, shopping? 0  Preparing Food and eating ? N  Using the Toilet? N  In the past six months, have you accidently leaked urine? Y  Do you have problems with loss of bowel control? N  Managing your Medications? N  Managing your Finances? N  Housekeeping or managing your Housekeeping? N    Patient Care Team: Wendee Lynwood HERO, NP as PCP - General Bobbie Vicenta BROCKS, MD (Internal Medicine)  I have updated your Care Teams any recent Medical Services you may have received from other providers in the past year.     Assessment:   This is a routine wellness examination for James Pearson.  Hearing/Vision screen Hearing Screening - Comments:: Some difficulties no hearing aids  Vision Screening - Comments:: Patient wears contacts    Goals Addressed             This Visit's Progress    Patient Stated       To get back to normal weight        Depression Screen     04/02/2024    1:08 PM 10/31/2023   12:22 PM 07/18/2023   12:28 PM 03/24/2023    1:50 PM 10/28/2022   12:20 PM 12/14/2021    1:57 PM 10/09/2021    4:11 PM  PHQ 2/9 Scores  PHQ - 2 Score 2 1 2  0 0 0 6  PHQ- 9 Score 3 9 16  4  21     Fall Risk     04/02/2024    1:07 PM 10/31/2023   12:24 PM 07/18/2023   12:29 PM 03/24/2023    1:53 PM 12/08/2022   11:02 AM  Fall Risk   Falls in the past year? 0 0 0 0 0  Number falls in past yr: 0 0 0 0 0  Injury with Fall? 0 0 0 0 0  Risk for fall due to : No Fall Risks No Fall Risks No Fall Risks No Fall Risks No Fall Risks  Follow up Falls evaluation completed Falls evaluation completed Falls evaluation completed Falls prevention discussed;Falls evaluation completed Falls evaluation completed    MEDICARE RISK AT HOME:  Medicare Risk at Home Any stairs in or around the home?: No If  so, are there any without handrails?: No Home free of loose throw rugs in walkways, pet beds, electrical cords, etc?: Yes Adequate lighting in your home to reduce risk of falls?: Yes Life alert?: No Use of a cane, walker or w/c?: No Grab bars in the bathroom?: Yes Shower chair or bench in shower?: Yes Elevated toilet seat or a handicapped toilet?: Yes  TIMED UP AND GO:  Was the test performed?  No  Cognitive Function: 6CIT completed    07/23/2022    3:36 PM  MMSE - Mini Mental State Exam  Orientation to time 5  Orientation to Place 5  Registration 3  Attention/ Calculation 4  Recall 3  Language- name 2 objects 2  Language- repeat 1  Language- follow 3 step command 3  Language- read & follow direction 1  Write a sentence 0  Copy design 1  Total score 28        04/02/2024    1:02 PM 03/24/2023    1:57 PM 12/14/2021    2:03 PM  6CIT Screen  What Year? 0 points 0 points 0 points  What month? 0 points 0 points 0 points  What time? 0 points 0  points 0 points  Count back from 20 0 points 0 points 0 points  Months in reverse 0 points 0 points 0 points  Repeat phrase 0 points 0 points 0 points  Total Score 0 points 0 points 0 points    Immunizations Immunization History  Administered Date(s) Administered    sv, Bivalent, Protein Subunit Rsvpref,pf (Abrysvo) 11/17/2022   Fluad Quad(high Dose 65+) 04/29/2020, 05/14/2021, 05/11/2022   Hep A / Hep B 11/28/2009, 04/30/2010   INFLUENZA, HIGH DOSE SEASONAL PF 08/17/2022, 09/03/2022, 05/18/2023   Influenza, Seasonal, Injecte, Preservative Fre 05/03/2016   Influenza-Unspecified 05/13/2015   PFIZER Comirnaty(Gray Top)Covid-19 Tri-Sucrose Vaccine 11/21/2020   PFIZER(Purple Top)SARS-COV-2 Vaccination 09/26/2019, 10/18/2019, 05/08/2020   Pfizer Covid-19 Vaccine Bivalent Booster 7yrs & up 05/14/2021, 02/18/2022   Pfizer(Comirnaty)Fall Seasonal Vaccine 12 years and older 06/02/2022, 11/17/2022, 05/18/2023   Pneumococcal Conjugate  Pcv21, Polysaccharide Crm197 Conjugaf 11/16/2023   Pneumococcal Conjugate-13 07/27/2013   Pneumococcal Polysaccharide-23 09/19/2001, 10/31/2014   Td 03/26/2016   Td (Adult), 2 Lf Tetanus Toxid, Preservative Free 03/26/2016   Zoster Recombinant(Shingrix) 08/25/2018, 05/15/2019   Zoster, Live 06/13/2014    Screening Tests Health Maintenance  Topic Date Due   Hepatitis C Screening  Never done   Diabetic kidney evaluation - Urine ACR  10/10/2022   FOOT EXAM  10/10/2022   INFLUENZA VACCINE  03/09/2024   HEMOGLOBIN A1C  05/02/2024   Diabetic kidney evaluation - eGFR measurement  02/07/2025   OPHTHALMOLOGY EXAM  02/21/2025   Medicare Annual Wellness (AWV)  04/02/2025   DTaP/Tdap/Td (3 - Tdap) 03/26/2026   Pneumococcal Vaccine: 50+ Years  Completed   Zoster Vaccines- Shingrix  Completed   HPV VACCINES  Aged Out   Meningococcal B Vaccine  Aged Out   Hepatitis B Vaccines 19-59 Average Risk  Discontinued   Colonoscopy  Discontinued   COVID-19 Vaccine  Discontinued    Health Maintenance  Health Maintenance Due  Topic Date Due   Hepatitis C Screening  Never done   Diabetic kidney evaluation - Urine ACR  10/10/2022   FOOT EXAM  10/10/2022   INFLUENZA VACCINE  03/09/2024   Health Maintenance Items Addressed: Labs Ordered: Diabetic kidney exam   Additional Screening:  Vision Screening: Recommended annual ophthalmology exams for early detection of glaucoma and other disorders of the eye. Would you like a referral to an eye doctor? No    Dental Screening: Recommended annual dental exams for proper oral hygiene  Community Resource Referral / Chronic Care Management: CRR required this visit?  No   CCM required this visit?  No   Plan:    I have personally reviewed and noted the following in the patient's chart:   Medical and social history Use of alcohol, tobacco or illicit drugs  Current medications and supplements including opioid prescriptions. Patient is not currently  taking opioid prescriptions. Functional ability and status Nutritional status Physical activity Advanced directives List of other physicians Hospitalizations, surgeries, and ER visits in previous 12 months Vitals Screenings to include cognitive, depression, and falls Referrals and appointments  In addition, I have reviewed and discussed with patient certain preventive protocols, quality metrics, and best practice recommendations. A written personalized care plan for preventive services as well as general preventive health recommendations were provided to patient.   Lyle MARLA Right, [place]   04/02/2024   After Visit Summary: (MyChart) Due to this being a telephonic visit, the after visit summary with patients personalized plan was offered to patient via MyChart   Notes: Nothing significant to  report at this time.

## 2024-04-03 ENCOUNTER — Telehealth: Payer: Self-pay

## 2024-04-03 NOTE — Progress Notes (Unsigned)
 Complex Care Management Note Care Guide Note  04/03/2024 Name: Kalani Sthilaire MRN: [id number] DOB: Sep 04, 1945   Complex Care Management Outreach Attempts: An unsuccessful telephone outreach was attempted today to offer the patient information about available complex care management services.  Follow Up Plan:  Additional outreach attempts will be made to offer the patient complex care management information and services.   Encounter Outcome:  No Answer- Left message  Leotis Rase [facility], [facility] Guide  Direct Dial: [phone]  Fax [phone]

## 2024-04-04 ENCOUNTER — Ambulatory Visit: Payer: Self-pay | Admitting: Nurse Practitioner

## 2024-04-04 NOTE — Progress Notes (Unsigned)
 Complex Care Management Note Care Guide Note  04/04/2024 Name: James Pearson MRN: [id number] DOB: 1946-05-22   Complex Care Management Outreach Attempts: A second unsuccessful outreach was attempted today to offer the patient with information about available complex care management services.  Follow Up Plan:  Additional outreach attempts will be made to offer the patient complex care management information and services.   Encounter Outcome:  No Answer- Left message  Leotis Rase [facility], [facility] Guide  Direct Dial: [phone]  Fax [phone]

## 2024-04-04 NOTE — Telephone Encounter (Signed)
 FYI Only or Action Required?: Action required by provider: update on patient condition.  Patient was last seen in primary care on 02/08/2024 by Wendee Lynwood HERO, NP.  Called Nurse Triage reporting Shoulder Pain.  Interventions attempted: Prescription medications: oxycodone  5-10 mg.  Triage Disposition: See PCP Within 2 Weeks  Patient/caregiver understands and will follow disposition?: Yes                Copied from CRM [id number]. Topic: Clinical - Red Word Triage >> Apr 04, 2024  4:18 PM James Pearson wrote: [facility] that prompted transfer to Nurse Triage: Patient is needing to schedule an appointment for surgical clearance. The patient is having shoulder pain. Reason for Disposition  Shoulder pain is a chronic symptom (recurrent or ongoing AND present > 4 weeks)  Answer Assessment - Initial Assessment Questions This RN scheduled pt for first available appt with PCP on 9/26. This RN called CAL and confirmed a surgical clearance has to be with PCP. This RN will add pt to cancellation list as well.  Pt had surgery on his left shoulder 01/27/2023 but it didn't work so pt is having surgery again. Pt does not have a date yet for this surgery as it won't be scheduled until he gets clearance from PCP.  PAIN: How bad is the pain? (Scale 1-10; or mild, moderate, severe)     8-9/10 pain    OTHER SYMPTOMS: Do you have any other symptoms? (e.g., swelling, numbness     No  Protocols used: Shoulder Pain-A-AH

## 2024-04-05 NOTE — Progress Notes (Signed)
 Complex Care Management Note  Care Guide Note 04/05/2024 Name: James Pearson MRN: [id number] DOB: 08/25/1945  [name] is a 78 y.o. year old male who sees Cable, Lynwood HERO, NP for primary care. I reached out to Nancyann Favorite by phone today to offer complex care management services.  [name] was given information about Complex Care Management services today including:   The Complex Care Management services include support from the care team which includes your Nurse Care Manager, Clinical Social Worker, or Pharmacist.  The Complex Care Management team is here to help remove barriers to the health concerns and goals most important to you. Complex Care Management services are voluntary, and the patient may decline or stop services at any time by request to their care team member.   Complex Care Management Consent Status: Patient agreed to services and verbal consent obtained.   Follow up plan:  Telephone appointment with complex care management team member scheduled for:  04/06/24 and 04/18/24  Encounter Outcome:  Patient Scheduled   Leotis Rase [facility], [facility] Guide  Direct Dial: [phone]  Fax [phone]

## 2024-04-06 ENCOUNTER — Telehealth: Payer: Self-pay

## 2024-04-06 NOTE — Telephone Encounter (Signed)
 Ty with [facility] said pt had appt with Thersia Hoar with[facility] at 1 p, and call will not go thru because pt has phone set up if incoming calls are not in his phone the call will not go thru. Ty will ask pt if can disable this feature until gets visit with Thersia. I checked with Amy front office mgr and we do not have contact # for [employer]. Ty will see if pt can disable his phone feature to get call from [employer]. Sending FYI tom [place] pool.

## 2024-04-10 ENCOUNTER — Telehealth: Payer: Self-pay

## 2024-04-10 NOTE — Telephone Encounter (Signed)
 I will see patient as scheduled. He can follow up with ortho in the meantime to see if there is something more that can be done for the pain

## 2024-04-10 NOTE — Progress Notes (Signed)
 Complex Care Management Care Guide Note  04/10/2024 Name: Brockton Mckesson MRN: [id number] DOB: [birth year]  Isaiahs Chancy is a 78 y.o. year old male who is a primary care patient of Wendee Lynwood HERO, NP and is actively engaged with the care management team. I reached out to Nancyann Favorite by phone today to assist with re-scheduling  with the BSW.  Follow up plan: Unsuccessful telephone outreach attempt made. A HIPAA compliant phone message was left for the patient providing contact information and requesting a return call.  Leotis Rase [facility], [facility] Guide  Direct Dial: [phone]  Fax [phone]

## 2024-04-10 NOTE — Telephone Encounter (Unsigned)
 Copied from CRM [id number]. Topic: Appointments - Appointment Info/Confirmation >> Apr 06, 2024  1:22 PM Terri G wrote: Patient/patient representative is calling for information regarding an appointment. Patient had a virtual appointment with Thersia at 1pm. He did miss the call because any number that's not saved in his phone goes straight to VM. Callback [phone]

## 2024-04-16 ENCOUNTER — Ambulatory Visit (INDEPENDENT_AMBULATORY_CARE_PROVIDER_SITE_OTHER): Admitting: Nurse Practitioner

## 2024-04-16 VITALS — BP 128/60 | HR 79 | Temp 98.3°F | Ht 68.0 in | Wt 166.8 lb

## 2024-04-16 DIAGNOSIS — Z01818 Encounter for other preprocedural examination: Secondary | ICD-10-CM | POA: Diagnosis not present

## 2024-04-16 DIAGNOSIS — Z7984 Long term (current) use of oral hypoglycemic drugs: Secondary | ICD-10-CM

## 2024-04-16 DIAGNOSIS — M25512 Pain in left shoulder: Secondary | ICD-10-CM | POA: Diagnosis not present

## 2024-04-16 DIAGNOSIS — E119 Type 2 diabetes mellitus without complications: Secondary | ICD-10-CM | POA: Diagnosis not present

## 2024-04-16 DIAGNOSIS — Z125 Encounter for screening for malignant neoplasm of prostate: Secondary | ICD-10-CM | POA: Diagnosis not present

## 2024-04-16 DIAGNOSIS — G8929 Other chronic pain: Secondary | ICD-10-CM | POA: Diagnosis not present

## 2024-04-16 DIAGNOSIS — I10 Essential (primary) hypertension: Secondary | ICD-10-CM | POA: Diagnosis not present

## 2024-04-16 LAB — BASIC METABOLIC PANEL WITH GFR
BUN: 7 mg/dL (ref 6–23)
CO2: 37 meq/L — ABNORMAL HIGH (ref 19–32)
Calcium: 9.3 mg/dL (ref 8.4–10.5)
Chloride: 97 meq/L (ref 96–112)
Creatinine, Ser: 1.01 mg/dL (ref 0.40–1.50)
GFR: 72.04 mL/min (ref >60.00)
Glucose, Bld: 176 mg/dL — ABNORMAL HIGH (ref 70–99)
Potassium: 3.5 meq/L (ref 3.5–5.1)
Sodium: 140 meq/L (ref 135–145)

## 2024-04-16 LAB — CBC
HCT: 46.5 % (ref 39.0–52.0)
Hemoglobin: 15.4 g/dL (ref 13.0–17.0)
MCHC: 33 g/dL (ref 30.0–36.0)
MCV: 87.4 fl (ref 78.0–100.0)
Platelets: 135 K/uL — ABNORMAL LOW (ref 150.0–400.0)
RBC: 5.32 Mil/uL (ref 4.22–5.81)
RDW: 14.2 % (ref 11.5–15.5)
WBC: 7.4 K/uL (ref 4.0–10.5)

## 2024-04-16 LAB — PSA, MEDICARE: PSA: 1.93 ng/mL (ref 0.10–4.00)

## 2024-04-16 LAB — HEMOGLOBIN A1C: Hgb A1c MFr Bld: 6.9 % — ABNORMAL HIGH (ref 4.6–6.5)

## 2024-04-16 NOTE — Progress Notes (Signed)
 Acute Office Visit  Subjective:     Patient ID: James Pearson, male    DOB: 1946-03-10, 78 y.o.   MRN: [id number]  Chief Complaint  Patient presents with   Shoulder Pain    Pt complains of chronic left shoulder pain. . Pt states of having the shoulder pain before and after shoulder surgery last June. Complains pt does not have full movement in arm. Sharp to dull pain. Taken oxycodone  an hour ago.      Patient is in today for with a history of CVA, HTN, DM2, parkinson's, BPH, rotator cuff teat arthropathy, chronic back pain, TRT  Patient was seen by orthopedist on 03/09/2024 radiographs performed patient's oxycodone  was increased from 5 mg to 5 to 10 mgEvery 6 hours as needed.  Orthopedist would like to do a revision of the glenoid component left reverse shoulder arthroplasty.  Discussed the use of AI scribe software for clinical note transcription with the patient, who gave verbal consent to proceed.  History of Present Illness James Pearson is a 78 year old male with a history of left shoulder surgery who presents with persistent left shoulder pain.  He has been experiencing persistent left shoulder pain for over a year following surgery. The patient reports that the orthopedist told him the sharp pain is due to the ball of the shoulder hitting the bone, which leads to significant discomfort and decreased range of motion. This has impacted his ability to perform certain activities, and he is concerned about developing a frozen shoulder due to limited movement. He has tried using a sling but is hesitant due to this concern.  He has consulted with an orthopedist who, according to the patient, pointed out the area causing pain and provided pain medication for post-surgery use, which he has not yet utilized. He has tried using a TENS unit and occasionally takes Tylenol  for pain relief. He has not been using heat or ice regularly. No numbness or tingling down the arm is reported.  He has a  history of diabetes and manages his blood sugar with berberine and occasionally metformin . He has experienced recent weight loss, losing about eight pounds since March. He reports dry mouth and uses Biotene mouthwash. He also experiences occasional ankle swelling, which he attributes to amlodipine , a medication for blood pressure. No chest pain or shortness of breath is reported. He continues to use testosterone replacement therapy.  No trouble or adverse effect of anesthesia No personal or family history of malignant Hyperthermia Does see neurology for hx of stroke Not followed by cardiology. Has a history of HTN   Review of Systems  Constitutional:  Negative for chills and fever.  Respiratory:  Negative for shortness of breath.   Cardiovascular:  Negative for chest pain.  Musculoskeletal:  Positive for joint pain.  Neurological:  Positive for weakness. Negative for tingling and headaches.  Psychiatric/Behavioral:  Negative for hallucinations and suicidal ideas.         Objective:    BP 128/60   Pulse 79   Temp 98.3 F (36.8 C) (Oral)   Ht 5' 8 (1.727 m)   Wt 166 lb 12.8 oz (75.7 kg) Comment: with shoes. 165 without  SpO2 95%   BMI 25.36 kg/m  BP Readings from Last 3 Encounters:  04/16/24 128/60  04/02/24 119/62  02/08/24 (!) 118/56   Wt Readings from Last 3 Encounters:  04/16/24 166 lb 12.8 oz (75.7 kg)  04/02/24 167 lb (75.8 kg)  02/08/24 167 lb 6.4 oz (  75.9 kg)   SpO2 Readings from Last 3 Encounters:  04/16/24 95%  02/08/24 96%  12/07/23 96%      Physical Exam Vitals and nursing note reviewed.  Constitutional:      Appearance: Normal appearance.  Cardiovascular:     Rate and Rhythm: Normal rate and regular rhythm.     Heart sounds: Normal heart sounds.  Pulmonary:     Effort: Pulmonary effort is normal.     Breath sounds: Normal breath sounds.  Musculoskeletal:        General: Tenderness present.     Right shoulder: Normal pulse.     Left shoulder:  Tenderness present. Decreased range of motion. Decreased strength. Normal pulse.       Arms:  Neurological:     Mental Status: He is alert.     Deep Tendon Reflexes:     Reflex Scores:      Bicep reflexes are 1+ on the right side and 1+ on the left side.    No results found for any visits on 04/16/24.      Assessment & Plan:   Problem List Items Addressed This Visit       Cardiovascular and Mediastinum   Essential hypertension   Relevant Orders   CBC   Basic metabolic panel with GFR     Endocrine   Type 2 diabetes mellitus without complication (HCC)   Relevant Orders   Hemoglobin A1c     Other   Pain in left shoulder - Primary   Other Visit Diagnoses       Pre-operative clearance       Relevant Orders   EKG 12-Lead     Screening for prostate cancer       Relevant Orders   PSA, Medicare      Assessment and Plan Assessment & Plan Left shoulder pain with decreased range of motion and contracture post-surgery Chronic pain and limited motion post-surgery. Previous physical therapy inadequate. He is hesitant about surgery due to fear of increased pain, but orthopedist suggests promising outcome. - Perform surgical clearance for potential shoulder surgery. - Order EKG and conduct blood work as part of preoperative evaluation. - Advise on the use of Tylenol , not exceeding 3 grams per day, for pain management.  Bilateral lower extremity edema Recurring edema likely due to amlodipine , which is effective for hypertension. Alternative medications discussed but amlodipine  preferred.  Type 2 diabetes mellitus Managed with berberine. Blood sugar at 120 mg/dL. Discussed glipizide as an alternative to metformin . A1c to be checked preoperatively. - Check A1c level as part of preoperative blood work.  Unintentional weight loss Unintentional weight loss of 8 pounds since March. No specific cause identified.  Follow-Up Plans for surgical clearance and preoperative  evaluation discussed. - Complete surgical clearance and preoperative evaluation today, including EKG and blood work.  No orders of the defined types were placed in this encounter.   Return in about 6 weeks (around 05/28/2024) for weight recheck .  Adina Crandall, NP

## 2024-04-16 NOTE — Patient Instructions (Signed)
 Nice to see you today As long as the labs look good, I will clear you for surgery Follow up with me in 6 weeks to recheck your weight

## 2024-04-17 ENCOUNTER — Encounter: Payer: Self-pay | Admitting: Nurse Practitioner

## 2024-04-18 ENCOUNTER — Other Ambulatory Visit: Payer: Self-pay

## 2024-04-18 VITALS — Wt 162.2 lb

## 2024-04-18 DIAGNOSIS — F32A Depression, unspecified: Secondary | ICD-10-CM

## 2024-04-18 NOTE — Patient Instructions (Addendum)
 Visit Information  Thank you for taking time to visit with me today. Please don't hesitate to contact me if I can be of assistance to you before our next scheduled appointment.  Your next care management appointment is by telephone on 05/02/2024 at 1:30 pm  Telephone follow-up 2 Sakshi Sermons  Please call the care guide team at [phone] if you need to cancel, schedule, or reschedule an appointment.   Please call the Suicide and Crisis Lifeline: 988 call the [place]  National Suicide Prevention Lifeline: 702-236-2665 or TTY: 203-336-0085 TTY 5142872660) to talk to a trained counselor call 1-800-273-TALK (toll free, 24 hour hotline) go to [facility] [address] [place] [phone]) call 911 if you are experiencing a Mental Health or Behavioral Health Crisis or need someone to talk to.  Nestora Duos, MSN, RN [place]  [facility], [facility] RN Care Manager Direct Dial: [phone] Fax: [phone]   Low-Sodium Eating Plan Salt (sodium) helps you keep a healthy balance of fluids in your body. Too much sodium can raise your blood pressure. It can also cause fluid and waste to be held in your body. Your health care provider or dietitian may recommend a low-sodium eating plan if you have high blood pressure (hypertension), kidney disease, liver disease, or heart failure. Eating less sodium can help lower your blood pressure and reduce swelling. It can also protect your heart, liver, and kidneys. What are tips for following this plan? Reading food labels  Check food labels for the amount of sodium per serving. If you eat more than one serving, you must multiply the listed amount by the number of servings. Choose foods with less than 140 milligrams (mg) of sodium per serving. Avoid foods with 300 mg of sodium or more per serving. Always check how much sodium is in a product, even if the label says unsalted or no salt  added. Shopping  Buy products labeled as low-sodium or no salt added. Buy fresh foods. Avoid canned foods and pre-made or frozen meals. Avoid canned, cured, or processed meats. Buy breads that have less than 80 mg of sodium per slice. Cooking  Eat more home-cooked food. Try to eat less restaurant, buffet, and fast food. Try not to add salt when you cook. Use salt-free seasonings or herbs instead of table salt or sea salt. Check with your provider or pharmacist before using salt substitutes. Cook with plant-based oils, such as canola, sunflower, or olive oil. Meal planning When eating at a restaurant, ask if your food can be made with less salt or no salt. Avoid dishes labeled as brined, pickled, cured, or smoked. Avoid dishes made with soy sauce, miso, or teriyaki sauce. Avoid foods that have monosodium glutamate (MSG) in them. MSG may be added to some restaurant food, sauces, soups, bouillon, and canned foods. Make meals that can be grilled, baked, poached, roasted, or steamed. These are often made with less sodium. General information Try to limit your sodium intake to 1,500-2,300 mg each day, or the amount told by your provider. What foods should I eat? Fruits Fresh, frozen, or canned fruit. Fruit juice. Vegetables Fresh or frozen vegetables. No salt added canned vegetables. No salt added tomato sauce and paste. Low-sodium or reduced-sodium tomato and vegetable juice. Grains Low-sodium cereals, such as oats, puffed wheat and rice, and shredded wheat. Low-sodium crackers. Unsalted rice. Unsalted pasta. Low-sodium bread. Whole grain breads and whole grain pasta. Meats and other proteins Fresh or frozen meat, poultry, seafood, and fish.  These should have no added salt. Low-sodium canned tuna and salmon. Unsalted nuts. Dried peas, beans, and lentils without added salt. Unsalted canned beans. Eggs. Unsalted nut butters. Dairy Milk. Soy milk. Cheese that is naturally low in sodium,  such as ricotta cheese, fresh mozzarella, or Swiss cheese. Low-sodium or reduced-sodium cheese. Cream cheese. Yogurt. Seasonings and condiments Fresh and dried herbs and spices. Salt-free seasonings. Low-sodium mustard and ketchup. Sodium-free salad dressing. Sodium-free light mayonnaise. Fresh or refrigerated horseradish. Lemon juice. Vinegar. Other foods Homemade, reduced-sodium, or low-sodium soups. Unsalted popcorn and pretzels. Low-salt or salt-free chips. The items listed above may not be all the foods and drinks you can have. Talk to a dietitian to learn more. What foods should I avoid? Vegetables Sauerkraut, pickled vegetables, and relishes. Olives. [place] fries. Onion rings. Regular canned vegetables, except low-sodium or reduced-sodium items. Regular canned tomato sauce and paste. Regular tomato and vegetable juice. Frozen vegetables in sauces. Grains Instant hot cereals. Bread stuffing, pancake, and biscuit mixes. Croutons. Seasoned rice or pasta mixes. Noodle soup cups. Boxed or frozen macaroni and cheese. Regular salted crackers. Self-rising flour. Meats and other proteins Meat or fish that is salted, canned, smoked, spiced, or pickled. Precooked or cured meat, such as sausages or meat loaves. [place]. Ham. Pepperoni. Hot dogs. Corned beef. Chipped beef. Salt pork. Jerky. Pickled herring, anchovies, and sardines. Regular canned tuna. Salted nuts. Dairy Processed cheese and cheese spreads. Hard cheeses. Cheese curds. Blue cheese. Feta cheese. String cheese. Regular cottage cheese. Buttermilk. Canned milk. Fats and oils Salted butter. Regular margarine. Ghee. Bacon fat. Seasonings and condiments Onion salt, garlic salt, seasoned salt, table salt, and sea salt. Canned and packaged gravies. Worcestershire sauce. Tartar sauce. Barbecue sauce. Teriyaki sauce. Soy sauce, including reduced-sodium soy sauce. Steak sauce. Fish sauce. Oyster sauce. Cocktail sauce. Horseradish that you find on the  shelf. Regular ketchup and mustard. Meat flavorings and tenderizers. Bouillon cubes. Hot sauce. Pre-made or packaged marinades. Pre-made or packaged taco seasonings. Relishes. Regular salad dressings. Salsa. Other foods Salted popcorn and pretzels. Corn chips and puffs. Potato and tortilla chips. Canned or dried soups. Pizza. Frozen entrees and pot pies. The items listed above may not be all the foods and drinks you should avoid. Talk to a dietitian to learn more. This information is not intended to replace advice given to you by your health care provider. Make sure you discuss any questions you have with your health care provider. Document Revised: 08/12/2022 Document Reviewed: 08/12/2022 Elsevier Patient Education  2024 Elsevier Inc.  Arthritis: What to Know Arthritis is a term for joint pain. It can also mean joint disease. There are more than 100 types of arthritis. What are the causes? Wear and tear of a joint. This is the most common cause. Gout. This is when you get too much of a chemical called uric acid in your blood. It can build up and cause pain. Pain and swelling in a joint. Infection of a joint. Injuries in the joint. A reaction to medicines. In some cases, the cause may not be known. What are the signs or symptoms? Pain in a joint when moving. This is the main symptom. Redness, swelling, or stiffness at a joint. Warmth coming from a joint. A fever. A feeling of being sick. How is this diagnosed? You may be diagnosed based on an exam and tests. These tests may include: Blood tests. Pee tests. Imaging tests, such as: X-rays. An MRI. A CT scan. A biopsy. This is when fluid is removed  from a joint for testing. How is this treated? To treat arthritis, you may need to: Treat the cause, if you know what it is. Rest. Raise the joint. Put cold or hot packs on the joint. Take medicines to: Treat symptoms. Reduce pain and swelling. Have shots of a medicine, such as  cortisone, put into the joint. You may also be told to make changes in your life. You may be told to: Do exercises. Lose weight. Follow these instructions at home: Medicines Take your medicines only as told. Do not take aspirin  if your health care provider says that you may have gout. Activity Rest your joint as told. Avoid doing things that make your pain worse. Exercise as told. You may need to do range-of-motion exercises or try: Swimming. Water  aerobics. Biking. Walking. Managing pain, stiffness, and swelling     Use ice or an ice pack as told. Place a towel between your skin and the ice. Leave the ice on for 20 minutes, 2-3 times a day. Use heat as told. Use the heat source that your provider recommends, such as a moist heat pack or a heating pad. Do this as often as told. Place a towel between your skin and the heat source. Leave the heat on for 20-30 minutes. If your skin turns red, take off the ice or heat right away to prevent skin damage. The risk of damage is higher if you can't feel pain, heat, or cold. If your joint is swollen, raise it above the level of your heart as told. If your joint feels stiff in the morning, try taking a warm shower. General instructions Stay at a healthy weight. Lose weight as told. Do not smoke, vape, or use nicotine or tobacco. Where to find more information [employer] of [employer]: niams.http://www.myers.net/ Contact a health care provider if: The pain gets worse. You have a fever. Get help right away if: You have very bad pain in your joint. You have swelling in your joint. Your joint is red. Many joints get painful and swollen. You have very bad back pain. Your leg is very weak. This information is not intended to replace advice given to you by your health care provider. Make sure you discuss any questions you have with your health care provider. Document Revised: 09/09/2023 Document Reviewed: 09/09/2023 Elsevier Patient Education   2025 Elsevier Inc.Compensation Strategies for Tremors  When eating, try the following Eat out of bowls, divided plates, or use a plate guard (available at a medical supply store) and eat with a spoon so that you have an edge to scoop up food. Try raising your plate so that there is less distance between the plate and mouth.Try stabilizing elbows on the tables or against your body. Use utensil with built-up/larger grips as they are easier to hold.  When writing, try the following: Stabilize forearm on the table. Take your time as rushing/being stressed can increase tremors. Try a felt-tipped pen, it does not glide as much.  Avoid gel pens ( they move to much ). Consider using pre-printed labels with your name and address (carry them with you when you go out) or you can get stamps with your address or signature on it. Use a small tape recorder to record messages/reminders for yourself. Use pens with bigger grips.  When brushing your teeth, putting on make-up, or styling hair, try the following: Use an electric toothbrush. Use items with built-up grips. Stabilize your elbows against your body or on the counter. Use long-handled  brushes/combs. Use a hair dryer with a stand.  In general: Avoid stress, fatigue or rushing as this can increase tremors. Sit down for activities that require more control/coordination. Perform flicks.   Fall Prevention in the Home, Adult Falls can cause injuries and can happen to people of all ages. There are many things you can do to make your home safer and to help prevent falls. What actions can I take to prevent falls? General information Use good lighting in all rooms. Make sure to: Replace any light bulbs that burn out. Turn on the lights in dark areas and use night-lights. Keep items that you use often in easy-to-reach places. Lower the shelves around your home if needed. Move furniture so that there are clear paths around it. Do not use throw rugs  or other things on the floor that can make you trip. If any of your floors are uneven, fix them. Add color or contrast paint or tape to clearly mark and help you see: Grab bars or handrails. First and last steps of staircases. Where the edge of each step is. If you use a ladder or stepladder: Make sure that it is fully opened. Do not climb a closed ladder. Make sure the sides of the ladder are locked in place. Have someone hold the ladder while you use it. Know where your pets are as you move through your home. What can I do in the bathroom?     Keep the floor dry. Clean up any water  on the floor right away. Remove soap buildup in the bathtub or shower. Buildup makes bathtubs and showers slippery. Use non-skid mats or decals on the floor of the bathtub or shower. Attach bath mats securely with double-sided, non-slip rug tape. If you need to sit down in the shower, use a non-slip stool. Install grab bars by the toilet and in the bathtub and shower. Do not use towel bars as grab bars. What can I do in the bedroom? Make sure that you have a light by your bed that is easy to reach. Do not use any sheets or blankets on your bed that hang to the floor. Have a firm chair or bench with side arms that you can use for support when you get dressed. What can I do in the kitchen? Clean up any spills right away. If you need to reach something above you, use a step stool with a grab bar. Keep electrical cords out of the way. Do not use floor polish or wax that makes floors slippery. What can I do with my stairs? Do not leave anything on the stairs. Make sure that you have a light switch at the top and the bottom of the stairs. Make sure that there are handrails on both sides of the stairs. Fix handrails that are broken or loose. Install non-slip stair treads on all your stairs if they do not have carpet. Avoid having throw rugs at the top or bottom of the stairs. Choose a carpet that does not  hide the edge of the steps on the stairs. Make sure that the carpet is firmly attached to the stairs. Fix carpet that is loose or worn. What can I do on the outside of my home? Use bright outdoor lighting. Fix the edges of walkways and driveways and fix any cracks. Clear paths of anything that can make you trip, such as tools or rocks. Add color or contrast paint or tape to clearly mark and help you see anything that  might make you trip as you walk through a door, such as a raised step or threshold. Trim any bushes or trees on paths to your home. Check to see if handrails are loose or broken and that both sides of all steps have handrails. Install guardrails along the edges of any raised decks and porches. Have leaves, snow, or ice cleared regularly. Use sand, salt, or ice melter on paths if you live where there is ice and snow during the winter. Clean up any spills in your garage right away. This includes grease or oil spills. What other actions can I take? Review your medicines with your doctor. Some medicines can cause dizziness or changes in blood pressure, which increase your risk of falling. Wear shoes that: Have a low heel. Do not wear high heels. Have rubber bottoms and are closed at the toe. Feel good on your feet and fit well. Use tools that help you move around if needed. These include: Canes. Walkers. Scooters. Crutches. Ask your doctor what else you can do to help prevent falls. This may include seeing a physical therapist to learn to do exercises to move better and get stronger. Where to find more information Centers for Disease Control and Prevention, [employer]: TonerPromos.no [employer] on Aging: BaseRingTones.pl National Institute on Aging: BaseRingTones.pl Contact a doctor if: You are afraid of falling at home. You feel weak, drowsy, or dizzy at home. You fall at home. Get help right away if you: Lose consciousness or have trouble moving after a fall. Have a fall that causes a  head injury. These symptoms may be an emergency. Get help right away. Call 911. Do not wait to see if the symptoms will go away. Do not drive yourself to the hospital. This information is not intended to replace advice given to you by your health care provider. Make sure you discuss any questions you have with your health care provider. Document Revised: 03/29/2022 Document Reviewed: 03/29/2022 Elsevier Patient Education  2024 [employer].

## 2024-04-18 NOTE — Patient Outreach (Signed)
 Complex Care Management   Visit Note  04/18/2024  Name:  James Pearson MRN: [id number] DOB: 1946-02-28  Situation: Referral received for Complex Care Management related to Osteoarthritis, Pain, Fall Risk I obtained verbal consent from Patient.  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   Stroke (HCC) 08/10/2011    Assessment: Patient Reported Symptoms:  Cognitive Cognitive Status: Alert and oriented to person, place, and time, Insightful and able to interpret abstract concepts, Normal speech and language skills, Struggling with memory recall, Difficulties with attention and concentration (Parkinson's with memory issues) Cognitive/Intellectual Conditions Management [RPT]: None reported or documented in medical history or problem list   Health Maintenance Behaviors: Annual physical exam Healing Pattern: Slow Health Facilitated by: Pain control  Neurological Neurological Review of Symptoms: Dizziness Neurological Comment: vertigo with mecalize prn  HEENT HEENT Symptoms Reported: No symptoms reported HEENT Management Strategies: Routine screening    Cardiovascular Cardiovascular Symptoms Reported: Swelling in legs or feet Does patient have uncontrolled Hypertension?: No Cardiovascular Management Strategies: Medication therapy, Routine screening Weight: 162 lb 3.2 oz (73.6 kg) Cardiovascular Comment: states feet swell significantly when eats salty foods, elevates, discussed healthy choices and hydration, discussed HF symptoms and when to call PCP and red flags for ED re HF and stroke  Respiratory Respiratory Symptoms Reported: No symptoms reported Respiratory Management Strategies: Routine screening  Endocrine Endocrine Symptoms  Reported: No symptoms reported Is patient diabetic?: Yes Is patient checking blood sugars at home?: Yes List most recent blood sugar readings, include date and time of day: weekly 119 on Monday and prn - only needs to check extra 3-4x month, no lows, highest 120 Endocrine Self-Management Outcome: 4 (good)  Gastrointestinal Gastrointestinal Symptoms Reported: No symptoms reported Additional Gastrointestinal Details: limited ability to cook due to tremors - discussed healthy choices      Genitourinary Genitourinary Symptoms Reported: No symptoms reported Additional Genitourinary Details: tamsulosin  not heling as much now with emptying bladder and leakage,  will discuss with provider Genitourinary Management Strategies: Medication therapy  Integumentary Integumentary Symptoms Reported: No symptoms reported Additional Integumentary Details: dry skin and scalp at times, med prn Skin Management Strategies: Medication therapy, Routine screening  Musculoskeletal Musculoskelatal Symptoms Reviewed: Joint pain, Muscle pain, Other, Difficulty walking, Unsteady gait Other Musculoskeletal Symptoms: tremors Left shoulder limited ROM surgery pending, knee pain Musculoskeletal Management Strategies: Routine screening, Medication therapy Falls in the past year?: No Was there an injury with Fall?: No Patient at Risk for Falls Due to: Impaired balance/gait, Orthopedic patient Fall risk Follow up: Education provided, Falls evaluation completed, Falls prevention discussed  Psychosocial Psychosocial Symptoms Reported: Depression - if selected complete PHQ 2-9 Additional Psychological Details: Sertraline  helping - agreed to LCSW   Major Change/Loss/Stressor/Fears (CP): Medical condition, self, Resources Techniques to [place] with Loss/Stress/Change: Diversional activities Quality of Family Relationships: involved, supportive Do you feel physically threatened by others?: No    04/18/2024    PHQ2-9 Depression  Screening   Little interest or pleasure in doing things Several days  Feeling down, depressed, or hopeless Several days  PHQ-2 - Total Score 2  Trouble falling or staying asleep, or sleeping too much Nearly every day  Feeling tired or having little energy  More than half the days  Poor appetite or overeating  More than half the days  Feeling bad about yourself - or that you are a failure or have let yourself or your family down Several days  Trouble concentrating on things, such as reading the newspaper or watching television More than half the days  Moving or speaking so slowly that other people could have noticed.  Or the opposite - being so fidgety or restless that you have been moving around a lot more than usual More than half the days  Thoughts that you would be better off dead, or hurting yourself in some way Not at all  PHQ2-9 Total Score 14  If you checked off any problems, how difficult have these problems made it for you to do your work, take care of things at home, or get along with other people Very difficult  Depression Interventions/Treatment  (Referral to LCSW)    There were no vitals filed for this visit.  Medications Reviewed Today     Reviewed by Devra Lands, RN (Registered Nurse) on 04/18/24 at 1440  Med List Status: <None>   Medication Order Taking? Sig Documenting Provider Last Dose Status Informant  amLODipine  (NORVASC ) 5 MG tablet [id number] Yes TAKE 1 TABLET BY MOUTH DAILY Wendee Lynwood HERO, NP  Active   aspirin  81 MG chewable tablet [id number] Yes Chew 81 mg by mouth in the morning. [provider]  Active Self  aspirin  EC 325 MG tablet [id number]  Take 1 tablet (325 mg total) by mouth daily.  Patient not taking: Reported on 04/18/2024   Genelle Standing, MD  Active   atorvastatin  (LIPITOR) 80 MG tablet [id number] Yes TAKE 1 TABLET BY MOUTH ONCE  DAILY Wendee Lynwood HERO, NP  Active   Berberine Chloride (BERBERINE HCI PO) [id number] Yes Take 1,200 mg by mouth in  the morning and at bedtime.  Patient taking differently: Take 1,200 mg by mouth in the morning and at bedtime. Reports taking daily due to cost   [provider]  Active   CALCIUM -VITAMIN D  PO [id number] Yes Take by mouth. Calcium  600 mg and vitamin d  400 mg [provider]  Active Self  cyclobenzaprine  (FLEXERIL ) 10 MG tablet [id number] Yes Take 1 tablet (10 mg total) by mouth 3 (three) times daily as needed for muscle spasms. Shuford, Randine, PA-C  Active   docusate sodium (COLACE) 100 MG capsule [id number] Yes Take 100 mg by mouth daily. [provider]  Active Self  enalapril  (VASOTEC ) 20 MG tablet [id number] Yes TAKE 2 TABLETS BY MOUTH ONCE  DAILY Wendee Lynwood HERO, NP  Active   Fluocinolone  Acetonide Body 0.01 % OIL [id number] Yes Apply to scalp, face and ears QD to BID Jackquline Sawyer, MD  Active Self  hydrochlorothiazide  (HYDRODIURIL ) 12.5 MG tablet [id number] Yes TAKE 1 TABLET BY MOUTH DAILY Wendee Lynwood HERO, NP  Active   Meclizine HCl 25 MG CHEW [id number] Yes Chew 25 mg by mouth daily as needed. [provider]  Active   metFORMIN  (GLUCOPHAGE -XR) 500 MG 24 hr tablet [id number]  TAKE 1 TABLET BY MOUTH DAILY  WITH BREAKFAST  Patient not taking: Reported on 04/18/2024   Wendee Lynwood HERO, NP  Active   Multiple Vitamins-Minerals (MULTIVITAMIN WITH MINERALS) tablet [id number] Yes Take 1 tablet by mouth daily. Centrum silver 50+ [provider]  Active Self  naloxone (NARCAN) nasal spray 4 mg/0.1 mL [id number] Yes Place 1 spray into the nose once. [provider]  Active  ondansetron  (ZOFRAN ) 4 MG tablet [id number] Yes Take 1 tablet (4 mg total) by mouth every 8 (eight) hours as needed for nausea or vomiting. Shuford, Randine, PA-C  Active   oxyCODONE  (OXY IR/ROXICODONE ) 5 MG immediate release tablet [id number] Yes Take 5-10 mg by mouth every 6 (six) hours as needed. [provider]  Active   oxyCODONE  (ROXICODONE ) 5 MG immediate release tablet [id number]   Take 1 tablet (5 mg total) by mouth every 4 (four) hours as needed for severe pain (pain score 7-10) or breakthrough pain. Genelle Standing, MD  Active   potassium chloride  SA (KLOR-CON  M) 20 MEQ tablet [id number]  Take 2 tablets (40 mEq total) by mouth once for 1 dose.  Patient not taking: Reported on 04/18/2024   Wendee Lynwood HERO, NP  Expired 04/16/24 2359   sertraline  (ZOLOFT ) 50 MG tablet [id number] Yes Take 1 tablet (50 mg total) by mouth daily. Wendee Lynwood HERO, NP  Active   sildenafil (VIAGRA) 100 MG tablet [id number] Yes Take 50-100 mg by mouth daily as needed for erectile dysfunction. [provider]  Active Self  Specialty Vitamins Products (PROSTATE PO) [id number] Yes Take 250 mg by mouth at bedtime. supra beta prostate [provider]  Active Self  tamsulosin  (FLOMAX ) 0.4 MG CAPS capsule [id number] Yes TAKE 2 CAPSULES BY MOUTH DAILY Wendee Lynwood HERO, NP  Active   testosterone cypionate (DEPOTESTOSTERONE CYPIONATE) 200 MG/ML injection [id number] Yes Inject 1 mL into the muscle every 28 (twenty-eight) days. [provider]  Active Self  tobramycin-dexamethasone  (TOBRADEX) ophthalmic solution [id number] Yes Place 1 drop into the left eye daily as needed (eye infection). [provider]  Active Self  traZODone  (DESYREL ) 50 MG tablet [id number] Yes TAKE 1 TABLET BY MOUTH AT  BEDTIME AS NEEDED FOR SLEEP Wendee Lynwood HERO, NP  Active   TURMERIC PO [id number] Yes Take 1 capsule by mouth daily as needed (Knee pain). [provider]  Active Self            Recommendation:   PCP Follow-up Continue Current Plan of Care Movement Specialist Dr Tat  Follow Up Plan:   Telephone follow-up in 1 month BSW - finances 04/23/2024 9:00 am LCSW Depression/Grief 05/04/24 at 3:00 pm  Nestora Duos, MSN, RN [place]  [facility], [facility] RN Care Manager Direct Dial: [phone] Fax: [phone]

## 2024-04-19 ENCOUNTER — Ambulatory Visit: Payer: Self-pay | Admitting: Nurse Practitioner

## 2024-04-19 NOTE — Progress Notes (Unsigned)
 Complex Care Management Note Care Guide Note  04/19/2024 Name: James Pearson MRN: [id number] DOB: 15-Jun-1946   Complex Care Management Outreach Attempts: An unsuccessful telephone outreach was attempted today to offer the patient information about available complex care management services.  Follow Up Plan:  Additional outreach attempts will be made to offer the patient complex care management information and services.   Encounter Outcome:  No Answer-Left voicemail  Leotis Rase [facility], [facility] Guide  Direct Dial: [phone]  Fax [phone]

## 2024-04-20 NOTE — Progress Notes (Signed)
 Complex Care Management Care Guide Note  04/20/2024 Name: James Pearson MRN: [id number] DOB: 1946-04-15  James Pearson is a 78 y.o. year old male who is a primary care patient of Wendee Lynwood HERO, NP and is actively engaged with the care management team. I reached out to Nancyann Favorite by phone today to assist with re-scheduling  with the BSW.  Follow up plan: Unsuccessful telephone outreach attempt made. A HIPAA compliant phone message was left for the patient providing contact information and requesting a return call.  Leotis Rase [facility], [facility] Guide  Direct Dial: [phone]  Fax [phone]

## 2024-04-23 ENCOUNTER — Telehealth: Payer: Self-pay

## 2024-04-27 ENCOUNTER — Other Ambulatory Visit: Payer: Self-pay

## 2024-04-27 NOTE — Patient Instructions (Signed)
 Visit Information  Thank you for taking time to visit with me today. Please don't hesitate to contact me if I can be of assistance to you before our next scheduled appointment.  Our next appointment is by telephone on 05/09/24 at 1pm Please call the care guide team at [phone] if you need to cancel or reschedule your appointment.   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  Interventions:  SW mailed resources for food, utilities, PCS  Patient Goals/Self-Care Activities:  Patient will use resources SW sent via mail  Plan:   The care management team will reach out to the patient again over the next 10-15 days.        Please call the Suicide and Crisis Lifeline: 988 call the [place]  National Suicide Prevention Lifeline: 234-822-6301 or TTY: 3316203691 TTY 947 671 3842) to talk to a trained counselor call 1-800-273-TALK (toll free, 24 hour hotline) go to [facility] [address], [place] [phone]) call 911 if you are experiencing a Mental Health or Behavioral Health Crisis or need someone to talk to.  Patient verbalizes understanding of instructions and care plan provided today and agrees to view in MyChart. Active MyChart status and patient understanding of how to access instructions and care plan via MyChart confirmed with patient.     Thersia Hoar, HEDWIG, MHA [place]  [facility] Social Worker, Population Health [phone]

## 2024-04-27 NOTE — Patient Outreach (Signed)
 Complex Care Management   Visit Note  04/27/2024  Name:  James Pearson MRN: [id number] DOB: 12/30/1945  Situation: Referral received for Complex Care Management related to SDOH Barriers:  Food insecurity Lack of essential utilities utilities PCS I obtained verbal consent from Patient.  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 SW will check to see if the [place] or Medicare will cover PCS for patient. SW and patient agreed for resources for food and utiilities to be mailed.  SDOH Interventions    Flowsheet Row Patient Outreach Telephone from 04/27/2024 in [place] POPULATION HEALTH DEPARTMENT Patient Outreach Telephone from 04/18/2024 in [place] POPULATION HEALTH DEPARTMENT Clinical Support from 04/02/2024 in [facility] Visit from 10/31/2023 in [facility] Clinical Support from 03/24/2023 in [facility] Documentation from 01/04/2023 in [facility]  SDOH Interventions        Food Insecurity Interventions -- Intervention Not Indicated Intervention Not Indicated -- Intervention Not Indicated, Patient Declined Intervention Not Indicated  Housing Interventions -- Intervention Not Indicated Intervention Not Indicated, Patient Declined -- Intervention Not Indicated, Patient Declined Intervention Not Indicated  Transportation Interventions -- Intervention Not Indicated Intervention Not Indicated -- Intervention Not Indicated, Patient  Declined Intervention Not Indicated  Utilities Interventions Community Resources Provided AMB Referral Intervention Not Indicated, Patient Declined -- Intervention Not Indicated, Patient Declined Intervention Not Indicated  Alcohol Usage Interventions -- Intervention Not Indicated (Score <7) Intervention Not Indicated (Score <7) -- Intervention Not Indicated (Score <7), Patient Declined --  Depression Interventions/Treatment  -- --  [Referral to LCSW] PHQ2-9 Score <4 Follow-up Not Indicated Currently on Treatment, Medication -- --  Financial Strain Interventions Community Resources Provided Other (Comment)  [Scheduled for BSW] Intervention Not Indicated -- Intervention Not Indicated, Patient Declined --  Physical Activity Interventions -- Intervention Not Indicated Intervention Not Indicated, Patient Declined -- Intervention Not Indicated, Patient Declined --  Stress Interventions -- Intervention Not Indicated Intervention Not Indicated -- Intervention Not Indicated, Patient Declined --  Social Connections Interventions -- Intervention Not Indicated Intervention Not Indicated -- Intervention Not Indicated --  Health Literacy Interventions -- -- Intervention Not Indicated -- Intervention Not Indicated --    Recommendation:   No recommendations at this time  Follow Up Plan:   Telephone follow-up on 05/09/24 at 1pm  Thersia Hoar, BSW, MHA [place]  [facility] Social Worker, Population Health [phone]

## 2024-04-30 ENCOUNTER — Other Ambulatory Visit: Payer: Self-pay | Admitting: Nurse Practitioner

## 2024-04-30 DIAGNOSIS — F320 Major depressive disorder, single episode, mild: Secondary | ICD-10-CM

## 2024-05-02 ENCOUNTER — Other Ambulatory Visit: Payer: Self-pay

## 2024-05-02 NOTE — Patient Instructions (Addendum)
 Visit Information  Thank you for taking time to visit with me today. Please don't hesitate to contact me if I can be of assistance to you before our next scheduled appointment.  Your next care management appointment is by telephone on 05/30/24 at 1:30 pm  Telephone follow-up in 1 month  Please call the care guide team at [phone] if you need to cancel, schedule, or reschedule an appointment.   Please call the Suicide and Crisis Lifeline: 988 call the [place]  National Suicide Prevention Lifeline: (647)001-4948 or TTY: 5860238866 TTY 330 012 4169) to talk to a trained counselor call 1-800-273-TALK (toll free, 24 hour hotline) go to [facility] [address] [place] [phone]) call 911 if you are experiencing a Mental Health or Behavioral Health Crisis or need someone to talk to.  Nestora Duos, MSN, RN [place]  [facility], [facility] RN Care Manager Direct Dial: [phone] Fax: [phone]  Fall Prevention in the Home, Adult Falls can cause injuries and can happen to people of all ages. There are many things you can do to make your home safer and to help prevent falls. What actions can I take to prevent falls? General information Use good lighting in all rooms. Make sure to: Replace any light bulbs that burn out. Turn on the lights in dark areas and use night-lights. Keep items that you use often in easy-to-reach places. Lower the shelves around your home if needed. Move furniture so that there are clear paths around it. Do not use throw rugs or other things on the floor that can make you trip. If any of your floors are uneven, fix them. Add color or contrast paint or tape to clearly mark and help you see: Grab bars or handrails. First and last steps of staircases. Where the edge of each step is. If you use a ladder or stepladder: Make sure that it is fully opened. Do not climb a closed  ladder. Make sure the sides of the ladder are locked in place. Have someone hold the ladder while you use it. Know where your pets are as you move through your home. What can I do in the bathroom?     Keep the floor dry. Clean up any water  on the floor right away. Remove soap buildup in the bathtub or shower. Buildup makes bathtubs and showers slippery. Use non-skid mats or decals on the floor of the bathtub or shower. Attach bath mats securely with double-sided, non-slip rug tape. If you need to sit down in the shower, use a non-slip stool. Install grab bars by the toilet and in the bathtub and shower. Do not use towel bars as grab bars. What can I do in the bedroom? Make sure that you have a light by your bed that is easy to reach. Do not use any sheets or blankets on your bed that hang to the floor. Have a firm chair or bench with side arms that you can use for support when you get dressed. What can I do in the kitchen? Clean up any spills right away. If you need to reach something above you, use a step stool with a grab bar. Keep electrical cords out of the way. Do not use floor polish or wax that makes floors slippery. What can I do with my stairs? Do not leave anything on the stairs. Make sure that you have a light switch at the top and the bottom of the stairs. Make sure that there are handrails  on both sides of the stairs. Fix handrails that are broken or loose. Install non-slip stair treads on all your stairs if they do not have carpet. Avoid having throw rugs at the top or bottom of the stairs. Choose a carpet that does not hide the edge of the steps on the stairs. Make sure that the carpet is firmly attached to the stairs. Fix carpet that is loose or worn. What can I do on the outside of my home? Use bright outdoor lighting. Fix the edges of walkways and driveways and fix any cracks. Clear paths of anything that can make you trip, such as tools or rocks. Add color or  contrast paint or tape to clearly mark and help you see anything that might make you trip as you walk through a door, such as a raised step or threshold. Trim any bushes or trees on paths to your home. Check to see if handrails are loose or broken and that both sides of all steps have handrails. Install guardrails along the edges of any raised decks and porches. Have leaves, snow, or ice cleared regularly. Use sand, salt, or ice melter on paths if you live where there is ice and snow during the winter. Clean up any spills in your garage right away. This includes grease or oil spills. What other actions can I take? Review your medicines with your doctor. Some medicines can cause dizziness or changes in blood pressure, which increase your risk of falling. Wear shoes that: Have a low heel. Do not wear high heels. Have rubber bottoms and are closed at the toe. Feel good on your feet and fit well. Use tools that help you move around if needed. These include: Canes. Walkers. Scooters. Crutches. Ask your doctor what else you can do to help prevent falls. This may include seeing a physical therapist to learn to do exercises to move better and get stronger. Where to find more information Centers for Disease Control and Prevention, STEADI: TonerPromos.no [employer] on Aging: BaseRingTones.pl National Institute on Aging: BaseRingTones.pl Contact a doctor if: You are afraid of falling at home. You feel weak, drowsy, or dizzy at home. You fall at home. Get help right away if you: Lose consciousness or have trouble moving after a fall. Have a fall that causes a head injury. These symptoms may be an emergency. Get help right away. Call 911. Do not wait to see if the symptoms will go away. Do not drive yourself to the hospital. This information is not intended to replace advice given to you by your health care provider. Make sure you discuss any questions you have with your health care provider. Document  Revised: 03/29/2022 Document Reviewed: 03/29/2022 Elsevier Patient Education  2024 Elsevier Inc.  Chronic Pain, Adult Chronic pain is a type of pain that lasts or keeps coming back for at least 3-6 months. You may have headaches, pain in the abdomen, or pain in other areas of the body. Chronic pain may be related to an illness, injury, or a health condition. Sometimes, the cause of chronic pain is not known. Chronic pain can make it hard for you to do daily activities. If it is not treated, chronic pain can lead to anxiety and depression. Treatment depends on the cause of your pain and how severe it is. You may need to work with a pain specialist to come up with a treatment plan. Many people benefit from two or more types of treatment to control their pain. Follow these  instructions at home: Treatment plan Follow your treatment plan as told by your health care provider. This may include: Gentle, regular exercise. Eating a healthy diet that includes foods such as vegetables, fruits, fish, and lean meats. Mental health therapy (cognitive or behavioral therapy) that changes the way you think or act in response to the pain. This may help improve how you feel. Doing physical therapy exercises to improve movement and strength. Meditation, yoga, acupuncture, or massage therapy. Using the oils from plants in your environment or on your skin (aromatherapy). Other treatments may include: Over-the-counter or prescription medicines. Color, light, or sound therapy. Local electrical stimulation. The electrical pulses help to relieve pain by temporarily stopping the nerve impulses that cause you to feel pain. Injections. These deliver numbing or pain-relieving medicines into the spine or the area of pain.  Medicines Take over-the-counter and prescription medicines only as told by your health care provider. Ask your health care provider if the medicine prescribed to you: Requires you to avoid driving or  using machinery. Can cause constipation. You may need to take these actions to prevent or treat constipation: Drink enough fluid to keep your urine pale yellow. Take over-the-counter or prescription medicines. Eat foods that are high in fiber, such as beans, whole grains, and fresh fruits and vegetables. Limit foods that are high in fat and processed sugars, such as fried or sweet foods. Lifestyle  Ask your health care provider whether you should keep a pain diary. Your health care provider will tell you what information to write in the diary. This may include: When you have pain. What the pain feels like. How medicines and other behaviors or treatments help to reduce the pain. Consider talking with a mental health care provider about how to help manage chronic pain. Consider joining a chronic pain support group. Try to control or lower your stress levels. Talk with your health care provider about ways to do this. General instructions Learn as much as you can about how to manage your chronic pain. Ask your health care provider if an intensive pain rehabilitation program or a chronic pain specialist would be helpful. Check your pain level as told by your health care provider. Ask your health care provider if you should use a pain scale. Contact a health care provider if: Your pain is not controlled with treatment. You have new pain. You have side effects from pain medicine. You feel weak or you have trouble doing your normal activities. You have trouble sleeping or you develop confusion. You lose feeling or have numbness in your body. You lose control of your bowels or bladder. Get help right away if: Your pain suddenly gets much worse. You develop chest pain. You have trouble breathing or shortness of breath. You faint, or another person sees you faint. These symptoms may be an emergency. Get help right away. Call 911. Do not wait to see if the symptoms will go away. Do not drive  yourself to the hospital. Also, get help right away if: You have thoughts about hurting yourself or others. Take one of these steps if you feel like you may hurt yourself or others, or have thoughts about taking your own life: Go to your nearest emergency room. Call 911. Call the National Suicide Prevention Lifeline at 234-667-3778 or 988. This is open 24 hours a day. Text the Crisis Text Line at 424-744-9377. This information is not intended to replace advice given to you by your health care provider. Make sure you discuss  any questions you have with your health care provider. Document Revised: 03/17/2022 Document Reviewed: 02/17/2022 Elsevier Patient Education  2024 [employer].

## 2024-05-02 NOTE — Patient Outreach (Signed)
 Complex Care Management   Visit Note  05/02/2024  Name:  James Pearson MRN: [id number] DOB: February 28, 1946  Situation: Referral received for Complex Care Management related to OA Pain/Fall Prevention I obtained verbal consent from Patient.  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   Stroke (HCC) 08/10/2011    Assessment: Patient Reported Symptoms:  Cognitive Cognitive Status: Struggling with memory recall (thinks memory improving with supplement Neuro Q) Cognitive/Intellectual Conditions Management [RPT]: None reported or documented in medical history or problem list   Health Maintenance Behaviors: Annual physical exam Healing Pattern: Slow Health Facilitated by: Pain control  Neurological   Neurological Management Strategies: Medication therapy Neurological Comment: tremors - unable to write, propanalol from [place] - some improvement, no recent vertigo, occasional dizziness but improved, left foot weak at times feels like dragging for about 2 James Pearson  HEENT HEENT Symptoms Reported: Other: HEENT Management Strategies: Routine screening HEENT Comment: thinks voice low and raspy and was told sign of Parkinson's but stated Parkinsons is not allowed to live in this body, neither is cancer    Cardiovascular Cardiovascular Symptoms Reported: Swelling in legs or feet Weight: 168 lb (76.2 kg) Cardiovascular Comment: swelling in legs feet improved, keeping elevated, limiting salt but eating more because losing weight and issues cooking so eating high fat foods like ice cream and cookies - discussed healthy diet importace, states limited choices, receiving food resources in mail from [facility] advised to eapect early next week and aware of [facility] follow up  10/1, no BP checks 2 James Pearson - checking during visit 132/70 HR 54, denies sx HF  Respiratory Respiratory Symptoms Reported: No symptoms reported Other Respiratory Symptoms: no SOB Respiratory Management Strategies: Routine screening  Endocrine Endocrine Symptoms Reported: No symptoms reported Is patient diabetic?: Yes Is patient checking blood sugars at home?: Yes List most recent blood sugar readings, include date and time of day: weekly and prn 9/5 119 FBG, takes berberine and will take metformin  if feels high - not taken in months    Gastrointestinal Gastrointestinal Symptoms Reported: No symptoms reported      Genitourinary Genitourinary Symptoms Reported: Other Genitourinary Management Strategies: Medication therapy  Integumentary Additional Integumentary Details: interestd in dermatology referral for dry itchy skin despite using prescribed oil Skin Management Strategies: Medication therapy, Routine screening  Musculoskeletal Musculoskelatal Symptoms Reviewed: Joint pain, Back pain, Muscle pain, Difficulty walking, Unsteady gait, Weakness Other Musculoskeletal Symptoms: left shoulder 8-10/10 pending surgery painful limited movement, tremors limit fine motor activities knee pain controlled with injection, callig ortho re shoulder, at times feels like left foot might be dragging Musculoskeletal Management Strategies: Routine screening, Medication therapy Falls in the past year?: No Number of falls in past year: 1 or less Was there an injury with Fall?: No Fall Risk Category Calculator: 0 Patient Fall Risk Level: Low Fall Risk Patient at Risk for Falls Due to: Impaired balance/gait Fall risk Follow up: Falls evaluation completed, Falls prevention discussed, Education provided  Psychosocial Psychosocial Symptoms Reported: Depression - if selected complete PHQ 2-9 Additional Psychological Details: LCSW 9/26 at 3:00          05/02/2024    PHQ2-9 Depression Screening   Little interest  or pleasure  in doing things Several days  Feeling down, depressed, or hopeless Several days  PHQ-2 - Total Score 2  Trouble falling or staying asleep, or sleeping too much Nearly every day (working hard not to nap during the day, more on schedule)  Feeling tired or having little energy Nearly every day  Poor appetite or overeating  More than half the days  Feeling bad about yourself - or that you are a failure or have let yourself or your family down Several days  Trouble concentrating on things, such as reading the newspaper or watching television More than half the days  Moving or speaking so slowly that other people could have noticed.  Or the opposite - being so fidgety or restless that you have been moving around a lot more than usual More than half the days  Thoughts that you would be better off dead, or hurting yourself in some way Not at all  PHQ2-9 Total Score 15  If you checked off any problems, how difficult have these problems made it for you to do your work, take care of things at home, or get along with other people Very difficult  Depression Interventions/Treatment      Vitals:   05/02/24 1353  BP: 132/70  Pulse: (!) 54    Medications Reviewed Today     Reviewed by Devra Lands, RN (Registered Nurse) on 05/02/24 at 1344  Med List Status: <None>   Medication Order Taking? Sig Documenting Provider Last Dose Status Informant  amLODipine  (NORVASC ) 5 MG tablet [id number] Yes TAKE 1 TABLET BY MOUTH DAILY Wendee Lynwood HERO, NP  Active   aspirin  81 MG chewable tablet [id number] Yes Chew 81 mg by mouth in the morning. [provider]  Active Self  aspirin  EC 325 MG tablet [id number]  Take 1 tablet (325 mg total) by mouth daily.  Patient not taking: Reported on 04/18/2024   Genelle Standing, MD  Active   atorvastatin  (LIPITOR) 80 MG tablet [id number] Yes TAKE 1 TABLET BY MOUTH ONCE  DAILY Wendee Lynwood HERO, NP  Active   Berberine Chloride (BERBERINE HCI PO) [id number] Yes Take  1,200 mg by mouth in the morning and at bedtime.  Patient taking differently: Take 1,200 mg by mouth in the morning and at bedtime. Reports taking daily due to cost   [provider]  Active   CALCIUM -VITAMIN D  PO [id number] Yes Take by mouth. Calcium  600 mg and vitamin d  400 mg [provider]  Active Self  cyclobenzaprine  (FLEXERIL ) 10 MG tablet [id number] Yes Take 1 tablet (10 mg total) by mouth 3 (three) times daily as needed for muscle spasms. Shuford, Randine, PA-C  Active   docusate sodium (COLACE) 100 MG capsule [id number] Yes Take 100 mg by mouth daily. [provider]  Active Self  enalapril  (VASOTEC ) 20 MG tablet [id number] Yes TAKE 2 TABLETS BY MOUTH ONCE  DAILY Wendee Lynwood HERO, NP  Active   Fluocinolone  Acetonide Body 0.01 % OIL [id number] Yes Apply to scalp, face and ears QD to BID Jackquline Sawyer, MD  Active Self  hydrochlorothiazide  (HYDRODIURIL ) 12.5 MG tablet [id number] Yes TAKE 1 TABLET BY MOUTH DAILY Wendee Lynwood HERO, NP  Active   Meclizine HCl 25 MG CHEW [id number] Yes Chew 25 mg by mouth daily as needed. [provider]  Active   metFORMIN  (GLUCOPHAGE -XR) 500 MG 24 hr tablet [id number]  TAKE 1 TABLET BY MOUTH DAILY  WITH BREAKFAST  Patient not taking: Reported on 04/18/2024   Wendee Lynwood HERO,  NP  Active   Multiple Vitamins-Minerals (MULTIVITAMIN WITH MINERALS) tablet [id number] Yes Take 1 tablet by mouth daily. Centrum silver 50+ [provider]  Active Self  naloxone (NARCAN) nasal spray 4 mg/0.1 mL [id number] Yes Place 1 spray into the nose once. [provider]  Active   ondansetron  (ZOFRAN ) 4 MG tablet [id number] Yes Take 1 tablet (4 mg total) by mouth every 8 (eight) hours as needed for nausea or vomiting. Shuford, Randine, PA-C  Active   oxyCODONE  (OXY IR/ROXICODONE ) 5 MG immediate release tablet [id number] Yes Take 5-10 mg by mouth every 6 (six) hours as needed. [provider]  Active   oxyCODONE  (ROXICODONE ) 5 MG immediate  release tablet [id number]  Take 1 tablet (5 mg total) by mouth every 4 (four) hours as needed for severe pain (pain score 7-10) or breakthrough pain. Genelle Standing, MD  Active   potassium chloride  SA (KLOR-CON  M) 20 MEQ tablet [id number]  Take 2 tablets (40 mEq total) by mouth once for 1 dose.  Patient not taking: Reported on 04/18/2024   Wendee Lynwood HERO, NP  Expired 04/16/24 2359   sertraline  (ZOLOFT ) 50 MG tablet [id number] Yes TAKE 1 TABLET BY MOUTH DAILY Wendee Lynwood HERO, NP  Active   sildenafil (VIAGRA) 100 MG tablet [id number] Yes Take 50-100 mg by mouth daily as needed for erectile dysfunction. [provider]  Active Self  Specialty Vitamins Products (PROSTATE PO) [id number] Yes Take 250 mg by mouth at bedtime. supra beta prostate [provider]  Active Self  tamsulosin  (FLOMAX ) 0.4 MG CAPS capsule [id number] Yes TAKE 2 CAPSULES BY MOUTH DAILY Wendee Lynwood HERO, NP  Active   testosterone cypionate (DEPOTESTOSTERONE CYPIONATE) 200 MG/ML injection [id number] Yes Inject 1 mL into the muscle every 28 (twenty-eight) days. [provider]  Active Self  tobramycin-dexamethasone  (TOBRADEX) ophthalmic solution [id number] Yes Place 1 drop into the left eye daily as needed (eye infection). [provider]  Active Self  traZODone  (DESYREL ) 50 MG tablet [id number] Yes TAKE 1 TABLET BY MOUTH AT  BEDTIME AS NEEDED FOR SLEEP Wendee Lynwood HERO, NP  Active   TURMERIC PO [id number] Yes Take 1 capsule by mouth daily as needed (Knee pain). [provider]  Active Self            Recommendation:   PCP Follow-up Continue Current Plan of Care [facility]  Follow Up Plan:   Telephone follow-up in 1 month  Nestora Duos, MSN, RN [facility]  [facility], [facility] RN Care Manager Direct Dial: [phone] Fax: [phone]

## 2024-05-04 ENCOUNTER — Ambulatory Visit: Admitting: Nurse Practitioner

## 2024-05-04 ENCOUNTER — Other Ambulatory Visit: Payer: Self-pay | Admitting: *Deleted

## 2024-05-04 NOTE — Patient Instructions (Signed)
 Visit Information  Thank you for taking time to visit with me today. Please don't hesitate to contact me if I can be of assistance to you before our next scheduled appointment.  Our next appointment is by telephone on 05/23/24 at 3pm Please call the care guide team at [phone] if you need to cancel or reschedule your appointment.   Following is a copy of your care plan:   Goals Addressed             This Visit's Progress    VBC[facility] Social Work Care Plan       Problems:   Grief  CSW Clinical Goal(s):   Over the next 90 days the Patient will verbalize a decrease in depressive symptoms  as evidenced by self report and PHQ 9 score.  Interventions:  Mental Health:  Evaluation of current treatment plan related to Grief Grief response normalized-confirmed history of attending grief counseling 12 weeks session Active listening / Reflection utilized Mining engineer reviewed Emotional Support Provided Motivational Interviewing employed Solution focused strategies discussed: spiritual practices, exercise, medication adherence, and accepting support from family and friends PHQ2/PHQ9 completed GAD 7 completed  Patient Goals/Self-Care Activities:  Continue taking your medication as prescribed.   Continue to consider ongoing mental health counseling  Plan:   Telephone follow up appointment with care management team member scheduled for:  05/23/24 3pm        Please call the Suicide and Crisis Lifeline: 988 call the [place]  National Suicide Prevention Lifeline: (380)820-0002 or TTY: (272) 763-2310 TTY 820-078-7571) to talk to a trained counselor call 1-800-273-TALK (toll free, 24 hour hotline) go to [facility] [address], [place] [phone]) call 911 if you are experiencing a Mental Health or Behavioral Health Crisis or need someone to talk to.  Patient verbalizes understanding of instructions and care plan provided today and  agrees to view in MyChart. Active MyChart status and patient understanding of how to access instructions and care plan via MyChart confirmed with patient.     Renesha Lizama, LCSW [place]  [facility], [facility] Licensed Clinical Social Worker  Direct Dial: [phone]

## 2024-05-04 NOTE — Patient Outreach (Signed)
 Complex Care Management   Visit Note  05/04/2024  Name:  James Pearson MRN: [id number] DOB: 05/22/1946  Situation: Referral received for Complex Care Management related to Grief I obtained verbal consent from Patient.  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   Stroke (HCC) 08/10/2011    Assessment: Patient Reported Symptoms:  Cognitive Cognitive Status: Struggling with memory recall, Alert and oriented to person, place, and time, Normal speech and language skills Cognitive/Intellectual Conditions Management [RPT]: None reported or documented in medical history or problem list   Health Maintenance Behaviors: Annual physical exam Healing Pattern: Slow Health Facilitated by: Pain control  Neurological Neurological Review of Symptoms: Dizziness Neurological Management Strategies: Medication therapy Neurological Comment: patient reports having essential tremors in hands, unable to write  HEENT HEENT Symptoms Reported: No symptoms reported      Cardiovascular Cardiovascular Symptoms Reported: Swelling in legs or feet Does patient have uncontrolled Hypertension?: No Cardiovascular Management Strategies: Medication therapy, Routine screening Weight: 168 lb (76.2 kg) Cardiovascular Comment: swelling pretty big right nowtries to stay away from salt  and keep them elevated  Respiratory Respiratory Symptoms Reported: No symptoms reported    Endocrine Endocrine Symptoms Reported: No symptoms reported    Gastrointestinal Gastrointestinal Symptoms Reported: No symptoms reported      Genitourinary Genitourinary Symptoms Reported: Incontinence Additional Genitourinary Details: Tamsulosin  for frequency-helps some Genitourinary  Management Strategies: Medication therapy  Integumentary Integumentary Symptoms Reported: No symptoms reported    Musculoskeletal Musculoskelatal Symptoms Reviewed: Joint pain, Back pain, Muscle pain, Difficulty walking Other Musculoskeletal Symptoms: knee pain, righ need may need a cortisone shot, needs shoulder surgery-reverse shoulder orthoscopy Musculoskeletal Management Strategies: Routine screening, Medication therapy Musculoskeletal Self-Management Outcome: 3 (uncertain)      Psychosocial Psychosocial Symptoms Reported: Depression - if selected complete PHQ 2-9 Additional Psychological Details: wife died in 21-grief counseling was not for me went to 14 sessions Behavioral Management Strategies: Activity, Adequate rest Behaviors When Feeling Stressed/Fearful: church, bible study 3x per week, excercises at the [facility], 5 days per week to walk,  live my life and just keep moving foward has my angels people from church come in to check on me Techniques to [employer] with Loss/Stress/Change: Exercise, Diversional activities, Spiritual practice(s), Support group Quality of Family Relationships: involved, supportive Do you feel physically threatened by others?: No    05/04/2024    PHQ2-9 Depression Screening   Little interest or pleasure in doing things Several days  Feeling down, depressed, or hopeless Several days  PHQ-2 - Total Score 2  Trouble falling or staying asleep, or sleeping too much Nearly every day  Feeling tired or having little energy Nearly every day  Poor appetite or overeating  Nearly every day  Feeling bad about yourself - or that you are a failure or have let yourself or your family down Several days  Trouble concentrating on things, such as reading the newspaper or watching television    Moving or speaking so slowly that other people could have noticed.  Or the opposite - being so fidgety or restless that you have been moving around a lot more than usual Several days   Thoughts that you would be better off dead, or  hurting yourself in some way Not at all  PHQ2-9 Total Score 13  If you checked off any problems, how difficult have these problems made it for you to do your work, take care of things at home, or get along with other people Very difficult  Depression Interventions/Treatment Patient refuses Treatment    There were no vitals filed for this visit.  Medications Reviewed Today     Reviewed by Ermalinda Lenn HERO, LCSW (Social Worker) on 05/04/24 at 1525  Med List Status: <None>   Medication Order Taking? Sig Documenting Provider Last Dose Status Informant  amLODipine  (NORVASC ) 5 MG tablet [id number] Yes TAKE 1 TABLET BY MOUTH DAILY Wendee Lynwood HERO, NP  Active   aspirin  81 MG chewable tablet [id number] Yes Chew 81 mg by mouth in the morning. [provider]  Active Self  aspirin  EC 325 MG tablet [id number]  Take 1 tablet (325 mg total) by mouth daily.  Patient not taking: Reported on 05/04/2024   Genelle Standing, MD  Active   atorvastatin  (LIPITOR) 80 MG tablet [id number] Yes TAKE 1 TABLET BY MOUTH ONCE  DAILY Wendee Lynwood HERO, NP  Active   Berberine Chloride (BERBERINE HCI PO) [id number] Yes Take 1,200 mg by mouth in the morning and at bedtime.  Patient taking differently: Take 1,200 mg by mouth in the morning and at bedtime. Reports taking daily due to cost   [provider]  Active   CALCIUM -VITAMIN D  PO [id number] Yes Take by mouth. Calcium  600 mg and vitamin d  400 mg [provider]  Active Self  cyclobenzaprine  (FLEXERIL ) 10 MG tablet [id number] Yes Take 1 tablet (10 mg total) by mouth 3 (three) times daily as needed for muscle spasms. Shuford, Randine, PA-C  Active   docusate sodium (COLACE) 100 MG capsule [id number] Yes Take 100 mg by mouth daily. [provider]  Active Self  enalapril  (VASOTEC ) 20 MG tablet [id number] Yes TAKE 2 TABLETS BY MOUTH ONCE  DAILY Wendee Lynwood HERO, NP  Active   Fluocinolone  Acetonide Body 0.01 %  OIL [id number] Yes Apply to scalp, face and ears QD to BID Jackquline Sawyer, MD  Active Self  hydrochlorothiazide  (HYDRODIURIL ) 12.5 MG tablet [id number] Yes TAKE 1 TABLET BY MOUTH DAILY Wendee Lynwood HERO, NP  Active   Meclizine HCl 25 MG CHEW [id number] Yes Chew 25 mg by mouth daily as needed. [provider]  Active   metFORMIN  (GLUCOPHAGE -XR) 500 MG 24 hr tablet [id number]  TAKE 1 TABLET BY MOUTH DAILY  WITH BREAKFAST  Patient not taking: Reported on 05/04/2024   Wendee Lynwood HERO, NP  Active   Multiple Vitamins-Minerals (MULTIVITAMIN WITH MINERALS) tablet [id number] Yes Take 1 tablet by mouth daily. Centrum silver 50+ [provider]  Active Self  naloxone (NARCAN) nasal spray 4 mg/0.1 mL [id number] Yes Place 1 spray into the nose once. [provider]  Active   ondansetron  (ZOFRAN ) 4 MG tablet [id number] Yes Take 1 tablet (4 mg total) by mouth every 8 (eight) hours as needed for nausea or vomiting. Shuford, Randine, PA-C  Active   oxyCODONE  (OXY IR/ROXICODONE ) 5 MG immediate release tablet [id number] Yes Take 5-10 mg by mouth every 6 (six) hours as needed. [provider]  Active   oxyCODONE  (ROXICODONE ) 5 MG immediate release tablet [id number] Yes Take 1 tablet (5 mg total) by mouth every 4 (four) hours as needed for severe pain (pain score 7-10) or breakthrough pain. Genelle Standing, MD  Active   potassium chloride  SA (KLOR-CON  M)  20 MEQ tablet [id number]  Take 2 tablets (40 mEq total) by mouth once for 1 dose.  Patient not taking: Reported on 05/04/2024   Wendee Lynwood HERO, NP  Expired 04/16/24 2359   propranolol (INDERAL) 20 MG tablet [id number] Yes Take 20 mg by mouth daily. Received from [facility] [provider]  Active   sertraline  (ZOLOFT ) 50 MG tablet [id number] Yes TAKE 1 TABLET BY MOUTH DAILY Wendee Lynwood HERO, NP  Active   sildenafil (VIAGRA) 100 MG tablet [id number] Yes Take 50-100 mg by mouth daily as needed for erectile dysfunction. [provider]  Active Self   Specialty Vitamins Products (PROSTATE PO) [id number]  Take 250 mg by mouth at bedtime. supra beta prostate [provider]  Active Self  tamsulosin  (FLOMAX ) 0.4 MG CAPS capsule [id number] Yes TAKE 2 CAPSULES BY MOUTH DAILY Wendee Lynwood HERO, NP  Active   testosterone cypionate (DEPOTESTOSTERONE CYPIONATE) 200 MG/ML injection [id number] Yes Inject 1 mL into the muscle every 28 (twenty-eight) days. [provider]  Active Self  tobramycin-dexamethasone  (TOBRADEX) ophthalmic solution [id number] Yes Place 1 drop into the left eye daily as needed (eye infection). [provider]  Active Self  traZODone  (DESYREL ) 50 MG tablet [id number] Yes TAKE 1 TABLET BY MOUTH AT  BEDTIME AS NEEDED FOR SLEEP Wendee Lynwood HERO, NP  Active   TURMERIC PO [id number] Yes Take 1 capsule by mouth daily as needed (Knee pain). [provider]  Active Self            Recommendation:   PCP Follow-up Continue to consider ongoing mental health follow up  Follow Up Plan:   Telephone follow up appointment date/time:  05/22/24  Lenn Mean, LCSW [place] [facility] [place]  [facility] Licensed Clinical Social Worker  Direct Dial: [phone]

## 2024-05-07 ENCOUNTER — Telehealth: Payer: Self-pay

## 2024-05-07 ENCOUNTER — Ambulatory Visit: Admitting: Nurse Practitioner

## 2024-05-07 NOTE — Telephone Encounter (Signed)
 Pt complains of constant stomach growling/incontinence. Would like some OTC solutions.  Pt also requests for dermatology referral for hair.

## 2024-05-08 ENCOUNTER — Encounter (HOSPITAL_BASED_OUTPATIENT_CLINIC_OR_DEPARTMENT_OTHER): Payer: Self-pay | Admitting: Orthopaedic Surgery

## 2024-05-08 NOTE — Telephone Encounter (Signed)
 If he is having incontinence of his bowel that will require an office visit

## 2024-05-09 ENCOUNTER — Telehealth: Payer: Self-pay

## 2024-05-09 NOTE — Patient Instructions (Signed)
 James Pearson - I am sorry I was unable to reach you today for our scheduled appointment. I work with Wendee, Lynwood HERO, NP and am calling to support your healthcare needs. Please contact me at [phone] at your earliest convenience. I look forward to speaking with you soon.   Thank you,  Thersia Hoar, BSW, MHA [place]  [facility] Social Worker, Population Health [phone]

## 2024-05-10 ENCOUNTER — Ambulatory Visit

## 2024-05-13 NOTE — Progress Notes (Unsigned)
 James Sousa T. Thressa Shiffer, MD, CAQ Sports Medicine [facility] at [facility] [address] [place], [address]  Phone: [phone]  FAX: [phone]  James Pearson - 78 y.o. male  MRN [id number]  Date of Birth: September 05, 1945  Date: 05/14/2024  PCP: Wendee Lynwood HERO, NP  Referral: Wendee Lynwood HERO, NP  No chief complaint on file.  Subjective:   James Pearson is a 78 y.o. very pleasant male patient with There is no height or weight on file to calculate BMI. who presents with the following:  Discussed the use of AI scribe software for clinical note transcription with the patient, who gave verbal consent to proceed.  I saw the patient about 6 months ago, at that point I did do an intra-articular injection of the right knee. History of Present Illness     Review of Systems is noted in the HPI, as appropriate  Objective:   There were no vitals taken for this visit.  GEN: No acute distress; alert,appropriate. PULM: Breathing comfortably in no respiratory distress PSYCH: Normally interactive.   Laboratory and Imaging Data:  Assessment and Plan:   No diagnosis found.  Aspiration/Injection Procedure Note James Pearson 08-Sep-1945 Date of procedure: 05/14/2024  Procedure: Large Joint Aspiration / Injection of Knee, R Indications: Pain  Procedure Details Patient verbally consented to procedure. Risks, benefits, and alternatives explained. Sterilely prepped with Chloraprep. Ethyl cholride used for anesthesia. 9 cc Lidocaine  1% mixed with 1 mL of Kenalog  40 mg injected using the anteromedial approach without difficulty. No complications with procedure and tolerated well. Patient had decreased pain post-injection. Medication: 1 mL of Kenalog  40 mg  Assessment & Plan   Medication Management during today's office visit: No orders of the defined types were placed in this encounter.  There are no discontinued medications.  Orders placed today for  conditions managed today: No orders of the defined types were placed in this encounter.   Disposition: No follow-ups on file.  [employer] One speech-to-text software was used for transcription in this dictation.  Possible transcriptional errors can occur using Animal nutritionist.   Signed,  Jacques DASEN. Burwell Bethel, MD   Outpatient Encounter Medications as of 05/14/2024  Medication Sig   amLODipine  (NORVASC ) 5 MG tablet TAKE 1 TABLET BY MOUTH DAILY   aspirin  81 MG chewable tablet Chew 81 mg by mouth in the morning.   aspirin  EC 325 MG tablet Take 1 tablet (325 mg total) by mouth daily. (Patient not taking: Reported on 05/04/2024)   atorvastatin  (LIPITOR) 80 MG tablet TAKE 1 TABLET BY MOUTH ONCE  DAILY   Berberine Chloride (BERBERINE HCI PO) Take 1,200 mg by mouth in the morning and at bedtime. (Patient taking differently: Take 1,200 mg by mouth in the morning and at bedtime. Reports taking daily due to cost)   CALCIUM -VITAMIN D  PO Take by mouth. Calcium  600 mg and vitamin d  400 mg   cyclobenzaprine  (FLEXERIL ) 10 MG tablet Take 1 tablet (10 mg total) by mouth 3 (three) times daily as needed for muscle spasms.   docusate sodium (COLACE) 100 MG capsule Take 100 mg by mouth daily.   enalapril  (VASOTEC ) 20 MG tablet TAKE 2 TABLETS BY MOUTH ONCE  DAILY   Fluocinolone  Acetonide Body 0.01 % OIL Apply to scalp, face and ears QD to BID   hydrochlorothiazide  (HYDRODIURIL ) 12.5 MG tablet TAKE 1 TABLET BY MOUTH DAILY   Meclizine HCl 25 MG CHEW Chew 25 mg by mouth daily as needed.   metFORMIN  (  GLUCOPHAGE -XR) 500 MG 24 hr tablet TAKE 1 TABLET BY MOUTH DAILY  WITH BREAKFAST (Patient not taking: Reported on 05/04/2024)   Multiple Vitamins-Minerals (MULTIVITAMIN WITH MINERALS) tablet Take 1 tablet by mouth daily. Centrum silver 50+   naloxone (NARCAN) nasal spray 4 mg/0.1 mL Place 1 spray into the nose once.   ondansetron  (ZOFRAN ) 4 MG tablet Take 1 tablet (4 mg total) by mouth every 8 (eight) hours as needed  for nausea or vomiting.   oxyCODONE  (OXY IR/ROXICODONE ) 5 MG immediate release tablet Take 5-10 mg by mouth every 6 (six) hours as needed.   oxyCODONE  (ROXICODONE ) 5 MG immediate release tablet Take 1 tablet (5 mg total) by mouth every 4 (four) hours as needed for severe pain (pain score 7-10) or breakthrough pain.   potassium chloride  SA (KLOR-CON  M) 20 MEQ tablet Take 2 tablets (40 mEq total) by mouth once for 1 dose. (Patient not taking: Reported on 05/04/2024)   propranolol (INDERAL) 20 MG tablet Take 20 mg by mouth daily. Received from [facility]   sertraline  (ZOLOFT ) 50 MG tablet TAKE 1 TABLET BY MOUTH DAILY   sildenafil (VIAGRA) 100 MG tablet Take 50-100 mg by mouth daily as needed for erectile dysfunction.   Specialty Vitamins Products (PROSTATE PO) Take 250 mg by mouth at bedtime. supra beta prostate   tamsulosin  (FLOMAX ) 0.4 MG CAPS capsule TAKE 2 CAPSULES BY MOUTH DAILY   testosterone cypionate (DEPOTESTOSTERONE CYPIONATE) 200 MG/ML injection Inject 1 mL into the muscle every 28 (twenty-eight) days.   tobramycin-dexamethasone  (TOBRADEX) ophthalmic solution Place 1 drop into the left eye daily as needed (eye infection).   traZODone  (DESYREL ) 50 MG tablet TAKE 1 TABLET BY MOUTH AT  BEDTIME AS NEEDED FOR SLEEP   TURMERIC PO Take 1 capsule by mouth daily as needed (Knee pain).   No facility-administered encounter medications on file as of 05/14/2024.

## 2024-05-14 ENCOUNTER — Ambulatory Visit (INDEPENDENT_AMBULATORY_CARE_PROVIDER_SITE_OTHER): Admitting: Family Medicine

## 2024-05-14 ENCOUNTER — Encounter: Payer: Self-pay | Admitting: Family Medicine

## 2024-05-14 VITALS — BP 124/60 | HR 65 | Temp 98.6°F | Ht 68.0 in | Wt 165.4 lb

## 2024-05-14 DIAGNOSIS — M1711 Unilateral primary osteoarthritis, right knee: Secondary | ICD-10-CM | POA: Diagnosis not present

## 2024-05-14 MED ORDER — TRIAMCINOLONE ACETONIDE 40 MG/ML IJ SUSP
40.0000 mg | Freq: Once | INTRAMUSCULAR | Status: AC
  Administered 2024-05-14: 40 mg via INTRA_ARTICULAR

## 2024-05-21 ENCOUNTER — Telehealth

## 2024-05-22 ENCOUNTER — Telehealth: Payer: Self-pay

## 2024-05-22 NOTE — Patient Instructions (Signed)
 Nancyann Favorite - I am sorry I was unable to reach you today for our scheduled appointment. I work with Wendee, Lynwood HERO, NP and am calling to support your healthcare needs. Please contact me at [phone] at your earliest convenience. I look forward to speaking with you soon.   Thank you,  Thersia Hoar, BSW, MHA [place]  [facility] Social Worker, Population Health [phone]

## 2024-05-23 ENCOUNTER — Telehealth: Payer: Self-pay | Admitting: *Deleted

## 2024-05-27 ENCOUNTER — Other Ambulatory Visit: Payer: Self-pay | Admitting: Nurse Practitioner

## 2024-05-27 DIAGNOSIS — E7800 Pure hypercholesterolemia, unspecified: Secondary | ICD-10-CM

## 2024-05-28 ENCOUNTER — Telehealth: Payer: Self-pay | Admitting: Nurse Practitioner

## 2024-05-28 ENCOUNTER — Ambulatory Visit (INDEPENDENT_AMBULATORY_CARE_PROVIDER_SITE_OTHER): Admitting: Nurse Practitioner

## 2024-05-28 VITALS — BP 132/60 | HR 60 | Temp 98.2°F | Ht 68.0 in | Wt 166.0 lb

## 2024-05-28 DIAGNOSIS — R5382 Chronic fatigue, unspecified: Secondary | ICD-10-CM

## 2024-05-28 DIAGNOSIS — R2689 Other abnormalities of gait and mobility: Secondary | ICD-10-CM | POA: Diagnosis not present

## 2024-05-28 DIAGNOSIS — N3941 Urge incontinence: Secondary | ICD-10-CM

## 2024-05-28 NOTE — Telephone Encounter (Signed)
 Patient sees Sherran Berliner at [facility] neurology. He has a long standing history of lightheadedness. Does have a hx of CVA. I know she sees him for his tremor. Can see evaluate the lightheadedness/balance issue or does this require a new referral?

## 2024-05-28 NOTE — Progress Notes (Signed)
 James Pearson                                          MRN: [id number]   05/28/2024   The [facility] Quality Team Specialist reviewed this patient medical record for the purposes of chart review for care gap closure. The following were reviewed: chart review for care gap closure-kidney health evaluation for diabetes:eGFR  and uACR.    [facility]I Quality Team

## 2024-05-28 NOTE — Patient Instructions (Addendum)
 Nice to see you today  I will be in touch with the urine results once I have them  Follow up with me in approx 5 months, sooner if you need me   Let me know about therapy

## 2024-05-28 NOTE — Progress Notes (Signed)
 Established Patient Office Visit  Subjective   Patient ID: James Pearson, male    DOB: 1946/07/26  Age: 78 y.o. MRN: [id number]  Chief Complaint  Patient presents with   Follow-up    Pt complains of lightheadedness, dizziness,and lack of energy. States of swelling in both legs.     HPI  Discussed the use of AI scribe software for clinical note transcription with the patient, who gave verbal consent to proceed.  History of Present Illness James Pearson is a 78 year old male who presents with weight loss and lightheadedness.  He has been experiencing ongoing weight loss despite efforts to increase caloric intake. His weight was 168 pounds at the last visit, 165 pounds with a previous provider, and is currently 166 pounds. He describes a persistent feeling of hunger and a 'noisy gut' with frequent stomach growling, but no associated pain. He experienced nausea two weeks ago, which he attributes to mistakenly taking his night medications in the morning.  He describes persistent lightheadedness and dizziness, occurring both when sitting still and upon standing, with worsening over time. He moves slowly to avoid falling. He has not seen a neurologist specifically for these symptoms, although he has consulted one for hand tremors. He last saw a neurologist two months ago in [place] and had a phone consultation regarding potential treatment for tremors. No fever, chills, or vertigo. He describes his dizziness as an off-balance sensation.  He reports swelling in his legs, which fluctuates in severity. He has a history of hypertension and is currently on amlodipine . Recent home blood pressure readings were 134/70 and 124/60.  He experiences urinary incontinence with urgency, occurring several times a day, leading to accidents. [facility] but [facility] not [facility].  He maintains physical activity by attending the [facility] three to four times a week, primarily walking due  to shoulder limitations following surgery. He reports fatigue and lack of energy, with improved mood since starting sertraline . He sleeps better, achieving nearly eight hours on some nights, but still experiences fatigue. He had a sleep study last October, which was normal. He mentions a metallic taste in his mouth that lasted a couple of weeks, but it has since resolved.     Review of Systems  Constitutional:  Negative for chills and fever.  Respiratory:  Negative for shortness of breath.   Cardiovascular:  Positive for leg swelling. Negative for chest pain.  Neurological:  Positive for dizziness (lightheadedness). Negative for headaches.      Objective:     BP 132/60   Pulse 60   Temp 98.2 F (36.8 C) (Oral)   Ht 5' 8 (1.727 m)   Wt 166 lb (75.3 kg)   SpO2 95%   BMI 25.24 kg/m  BP Readings from Last 3 Encounters:  05/28/24 132/60  05/14/24 124/60  05/02/24 132/70   Wt Readings from Last 3 Encounters:  05/28/24 166 lb (75.3 kg)  05/14/24 165 lb 6 oz (75 kg)  05/04/24 168 lb (76.2 kg)   SpO2 Readings from Last 3 Encounters:  05/28/24 95%  05/14/24 98%  04/16/24 95%      Physical Exam Vitals and nursing note reviewed.  Constitutional:      Appearance: Normal appearance.  HENT:     Right Ear: Tympanic membrane, ear canal and external ear normal.     Left Ear: Tympanic membrane, ear canal and external ear normal.     Mouth/Throat:     Mouth: Mucous  membranes are moist.     Pharynx: Oropharynx is clear.  Eyes:     Extraocular Movements: Extraocular movements intact.     Pupils: Pupils are equal, round, and reactive to light.  Neck:     Vascular: No carotid bruit.  Cardiovascular:     Rate and Rhythm: Normal rate and regular rhythm.     Heart sounds: Normal heart sounds.  Pulmonary:     Effort: Pulmonary effort is normal.     Breath sounds: Normal breath sounds.  Musculoskeletal:     Right lower leg: Edema present.     Left lower leg: Edema present.   Lymphadenopathy:     Cervical: No cervical adenopathy.  Neurological:     General: No focal deficit present.     Mental Status: He is alert.     Cranial Nerves: Cranial nerves 2-12 are intact.     Gait: Gait abnormal.     Deep Tendon Reflexes:     Reflex Scores:      Bicep reflexes are 2+ on the right side and 2+ on the left side.      Patellar reflexes are 2+ on the right side and 2+ on the left side.    Comments: Bilateral upper and lower extremity strength 5/5      No results found for any visits on 05/28/24.    The ASCVD Risk score (Arnett DK, et al., 2019) failed to calculate for the following reasons:   Risk score cannot be calculated because patient has a medical history suggesting prior/existing ASCVD    Assessment & Plan:   Problem List Items Addressed This Visit       Other   Fatigue - Primary   Other Visit Diagnoses       Urge incontinence of urine         Balance disorder         Assessment and Plan Assessment & Plan Unintentional weight loss Weight increased to 166 lbs. Appetite present, difficulty gaining weight despite regular meals. Recent nausea resolved after medication timing correction. - Encourage maintaining three meals a day with snacks.  Chronic dizziness and imbalance Chronic dizziness and imbalance, possibly neurological. No recent neurologist consultation. Blood pressure stable. - Consult neurologist for evaluation of dizziness and imbalance. - Message current neurologist, Sherran Berliner, for next steps.  Urinary incontinence with urgency Increased urinary urgency and incontinence. No prior urologist consultation. Discussed potential medication side effects causing dizziness. - Obtain urine sample to rule out infection. - Consider referral to urology if urine sample is normal.  Bilateral lower extremity edema Intermittent leg swelling, possibly due to Amlodipine . Risks and benefits of altering blood pressure medication  discussed. - [facility] if [facility] worsen.  Fatigue and lack of energy Fatigue improving in the evening. Normal sleep study. Mood improved with medication. Possible seasonal affective disorder or dysthymia considered. - Encourage good sleep hygiene practices. - Consider therapy for mood support if fatigue persists.  Depression Mood improved with Zoloft . Some lack of motivation and energy. Previous grief counseling noted. Discussed potential benefits of therapy. - Consider therapy for mood support, available via telehealth.  General Health Maintenance Received flu and COVID vaccinations recently.   Return in about 5 months (around 10/26/2024) for CPE and Labs.    Adina Crandall, NP

## 2024-05-29 ENCOUNTER — Ambulatory Visit: Payer: Self-pay | Admitting: Nurse Practitioner

## 2024-05-29 DIAGNOSIS — N3941 Urge incontinence: Secondary | ICD-10-CM

## 2024-05-29 LAB — URINALYSIS W MICROSCOPIC + REFLEX CULTURE
Bacteria, UA: NONE SEEN /HPF
Bilirubin Urine: NEGATIVE
Glucose, UA: NEGATIVE
Hgb urine dipstick: NEGATIVE
Hyaline Cast: NONE SEEN /LPF
Ketones, ur: NEGATIVE
Leukocyte Esterase: NEGATIVE
Nitrites, Initial: NEGATIVE
Protein, ur: NEGATIVE
RBC / HPF: NONE SEEN /HPF (ref 0–2)
Specific Gravity, Urine: 1.009 (ref 1.001–1.035)
Squamous Epithelial / HPF: NONE SEEN /HPF (ref <=5)
WBC, UA: NONE SEEN /HPF (ref 0–5)
pH: 6.5 (ref 5.0–8.0)

## 2024-05-29 LAB — NO CULTURE INDICATED

## 2024-05-29 NOTE — Telephone Encounter (Addendum)
 Referral is not needed. Patient just needs to bring up new complaint at his next appointment.

## 2024-05-29 NOTE — Telephone Encounter (Signed)
  error

## 2024-05-30 ENCOUNTER — Other Ambulatory Visit: Payer: Self-pay

## 2024-05-30 DIAGNOSIS — Z79899 Other long term (current) drug therapy: Secondary | ICD-10-CM

## 2024-05-30 NOTE — Telephone Encounter (Signed)
 Can we call and inform the patient about this please

## 2024-05-30 NOTE — Patient Outreach (Signed)
 Complex Care Management   Visit Note  05/30/2024  Name:  James Pearson MRN: [id number] DOB: [birth year]  Situation: Referral received for Complex Care Management related to Chronic Pain OA I obtained verbal consent from Patient.  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   Stroke (HCC) 08/10/2011    Assessment: Patient Reported Symptoms:  Cognitive Cognitive Status: Struggling with memory recall Cognitive/Intellectual Conditions Management [RPT]: None reported or documented in medical history or problem list   Health Maintenance Behaviors: Annual physical exam Healing Pattern: Slow  Neurological Neurological Review of Symptoms: Weakness, Dizziness Neurological Management Strategies: Medication therapy Neurological Comment: dizziness and weakness, told PCP - tremors persist, Movement Specialist 06/18/24  HEENT HEENT Symptoms Reported: Not assessed      Cardiovascular Cardiovascular Symptoms Reported: Swelling in legs or feet Does patient have uncontrolled Hypertension?: No Cardiovascular Management Strategies: Medication therapy, Routine screening Cardiovascular Comment: tumeric and elevating legs with improvement of swelling, limits salt  Respiratory Respiratory Symptoms Reported: No symptoms reported Respiratory Management Strategies: Routine screening  Endocrine Endocrine Symptoms Reported: No symptoms reported Is patient diabetic?: Yes List most recent blood sugar readings, include date and time of day: not checking regularly, last week 117, ontinues with berberine and metformin  only if feels high Endocrine Self-Management Outcome: 3 (uncertain)  Gastrointestinal Gastrointestinal Symptoms Reported: Constipation, Abdominal  pain or discomfort Additional Gastrointestinal Details: reports no BM since Friday, abdominal discomfort, took Miralax for first time today, reports usually has BM daily, advised if no BM on day 2 should start Miralax, sx to call provder and red flags for ED provided, encouraged to eat vegetables and hydrate adequately      Genitourinary Genitourinary Symptoms Reported: Incontinence, Frequency Additional Genitourinary Details: leakage and frequency, [facility], no new orders    Integumentary Integumentary Symptoms Reported: Itching Additional Integumentary Details: cream from Derm with some improvement Skin Management Strategies: Routine screening, Medication therapy  Musculoskeletal Musculoskelatal Symptoms Reviewed: Joint pain, Back pain, Muscle pain Other Musculoskeletal Symptoms: shoulder surgery pending, had shot in knee - better walking 3-4 days at [facility] - [facility], using TENS unit Musculoskeletal Management Strategies: Routine screening, Medication therapy, Exercise Falls in the past year?: No Number of falls in past year: 1 or less Fall risk Follow up: Falls evaluation completed, Falls prevention discussed  Psychosocial Psychosocial Symptoms Reported: Depression - if selected complete PHQ 2-9 Additional Psychological Details: No show for BSW and LCSW - states phone wil not ring if number not in phone, now with issues in phone          05/30/2024    PHQ2-9 Depression Screening   Little interest or pleasure in doing things Several days  Feeling down, depressed, or hopeless Several days  PHQ-2 - Total Score 2  Trouble falling or staying asleep, or sleeping too much    Feeling tired or having little energy    Poor appetite or overeating     Feeling bad about yourself - or that you are a failure or have let yourself or your family down    Trouble concentrating on things, such as reading the newspaper or watching television    Moving or speaking so slowly that other people could  have noticed.  Or the opposite - being so fidgety or restless that you have been moving around a lot more than usual    Thoughts that you would be better off dead, or hurting yourself in some way    PHQ2-9 Total Score    If you checked off any problems, how difficult have these problems made it for you to do your work, take care of things at home, or get along with other people    Depression Interventions/Treatment      There were no vitals filed for this visit.  Medications Reviewed Today     Reviewed by Devra Lands, RN (Registered Nurse) on 05/30/24 at 1355  Med List Status: <None>   Medication Order Taking? Sig Documenting Provider Last Dose Status Informant  amLODipine  (NORVASC ) 5 MG tablet [id number] Yes TAKE 1 TABLET BY MOUTH DAILY Wendee Lynwood HERO, NP  Active   aspirin  81 MG chewable tablet [id number] Yes Chew 81 mg by mouth in the morning. [provider]  Active Self  aspirin  EC 325 MG tablet [id number]  Take 1 tablet (325 mg total) by mouth daily.  Patient not taking: Reported on 05/30/2024   Genelle Standing, MD  Active   atorvastatin  (LIPITOR) 80 MG tablet [id number] Yes TAKE 1 TABLET BY MOUTH ONCE  DAILY Wendee Lynwood HERO, NP  Active   Berberine Chloride (BERBERINE HCI PO) [id number] Yes Take 1,200 mg by mouth daily. [provider]  Active   CALCIUM -VITAMIN D  PO [id number] Yes Take by mouth. Calcium  600 mg and vitamin d  400 mg [provider]  Active Self  cyclobenzaprine  (FLEXERIL ) 10 MG tablet [id number]  Take 1 tablet (10 mg total) by mouth 3 (three) times daily as needed for muscle spasms.  Patient not taking: Reported on 05/30/2024   [place] Sora, PA-C  Active   docusate sodium (COLACE) 100 MG capsule [id number] Yes Take 100 mg by mouth daily. [provider]  Active Self  enalapril  (VASOTEC ) 20 MG tablet [id number] Yes TAKE 2 TABLETS BY MOUTH ONCE  DAILY Wendee Lynwood HERO, NP  Active   Fluocinolone  Acetonide Body 0.01 % OIL [id number] Yes Apply  to scalp, face and ears QD to BID Jackquline Sawyer, MD  Active Self  hydrochlorothiazide  (HYDRODIURIL ) 12.5 MG tablet [id number] Yes TAKE 1 TABLET BY MOUTH DAILY Wendee Lynwood HERO, NP  Active   Meclizine HCl 25 MG CHEW [id number] Yes Chew 25 mg by mouth daily as needed. [provider]  Active   metFORMIN  (GLUCOPHAGE -XR) 500 MG 24 hr tablet [id number] Yes TAKE 1 TABLET BY MOUTH DAILY  WITH BREAKFAST Wendee Lynwood HERO, NP  Active   Multiple Vitamins-Minerals (MULTIVITAMIN WITH MINERALS) tablet [id number] Yes Take 1 tablet by mouth daily. Centrum silver 50+ [provider]  Active Self  naloxone (NARCAN) nasal spray 4 mg/0.1 mL [id number] Yes Place 1 spray into the nose once. [provider]  Active   ondansetron  (ZOFRAN ) 4 MG tablet [id number] Yes Take 1 tablet (4 mg total) by mouth every 8 (eight) hours as needed for nausea or vomiting. Shuford, Sora, PA-C  Active   oxyCODONE  (OXY IR/ROXICODONE ) 5 MG immediate release tablet [id number] Yes Take 5-10 mg by mouth every 6 (six) hours as needed. [provider]  Active   oxyCODONE  (ROXICODONE ) 5 MG immediate release tablet [id number]  Take 1 tablet (5 mg total) by mouth every 4 (four) hours as needed for severe pain (pain score 7-10) or breakthrough pain. Genelle Standing, MD  Active  polyethylene glycol (MIRALAX / GLYCOLAX) 17 g packet [id number] Yes Take 17 g by mouth daily as needed. [provider]  Active   propranolol (INDERAL) 20 MG tablet [id number] Yes Take 20 mg by mouth daily. Received from [facility] [provider]  Active   sertraline  (ZOLOFT ) 50 MG tablet [id number] Yes TAKE 1 TABLET BY MOUTH DAILY Wendee Lynwood HERO, NP  Active   sildenafil (VIAGRA) 100 MG tablet [id number] Yes Take 50-100 mg by mouth daily as needed for erectile dysfunction. [provider]  Active Self  Specialty Vitamins Products (PROSTATE PO) [id number] Yes Take 250 mg by mouth at bedtime. supra beta prostate [provider]  Active  Self  tamsulosin  (FLOMAX ) 0.4 MG CAPS capsule [id number] Yes TAKE 2 CAPSULES BY MOUTH DAILY Wendee Lynwood HERO, NP  Active   testosterone cypionate (DEPOTESTOSTERONE CYPIONATE) 200 MG/ML injection [id number] Yes Inject 1 mL into the muscle every 28 (twenty-eight) days. [provider]  Active Self  tobramycin-dexamethasone  ANTHONEY) ophthalmic solution [id number] Yes Place 1 drop into the left eye daily as needed (eye infection). [provider]  Active Self  traZODone  (DESYREL ) 50 MG tablet [id number] Yes TAKE 1 TABLET BY MOUTH AT  BEDTIME AS NEEDED FOR SLEEP Wendee Lynwood HERO, NP  Active   TURMERIC PO [id number] Yes Take 1 capsule by mouth daily as needed (Knee pain). [provider]  Active Self            Recommendation:   PCP Follow-up Continue Current Plan of Care  Follow Up Plan:   Telephone follow-up in 1 month  Nestora Duos, MSN, RN [facility]  [facility], [facility] RN Care Manager Direct Dial: [phone] Fax: [phone]

## 2024-05-30 NOTE — Telephone Encounter (Signed)
 Contacted pt. He is aware of information. No questions or concerns.

## 2024-05-30 NOTE — Patient Instructions (Signed)
 Visit Information  Thank you for taking time to visit with me today. Please don't hesitate to contact me if I can be of assistance to you before our next scheduled appointment.  Your next care management appointment is by telephone on 06/19/2024 at 3:30 pm  Telephone follow-up in 1 month  Please call the care guide team at [phone] if you need to cancel, schedule, or reschedule an appointment.   Please call the Suicide and Crisis Lifeline: 988 call the [place]  National Suicide Prevention Lifeline: 458-771-8133 or TTY: 9153417740 TTY 540-241-4151) to talk to a trained counselor call 1-800-273-TALK (toll free, 24 hour hotline) go to [facility] [address], [place] [phone]) call 911 if you are experiencing a Mental Health or Behavioral Health Crisis or need someone to talk to.  Nestora Duos, MSN, RN [place]  [facility], [facility] RN Care Manager Direct Dial: [phone] Fax: [phone]   Constipation, Adult Constipation is when a person has trouble pooping (having a bowel movement). When you have this condition, you may poop fewer than 3 times a week. Your poop (stool) may also be dry, hard, or bigger than normal. Follow these instructions at home: Eating and drinking  Eat foods that have a lot of fiber, such as: Fresh fruits and vegetables. Whole grains. Beans. Eat less of foods that are low in fiber and high in fat and sugar, such as: [place] fries. Hamburgers. Cookies. Candy. Soda. Drink enough fluid to keep your pee (urine) pale yellow. General instructions Exercise regularly or as told by your doctor. Try to do 150 minutes of exercise each week. Go to the restroom when you feel like you need to poop. Do not hold it in. Take over-the-counter and prescription medicines only as told by your doctor. These include any fiber supplements. When you poop: Do deep breathing while relaxing your  lower belly (abdomen). Relax your pelvic floor. The pelvic floor is a group of muscles that support the rectum, bladder, and intestines (as well as the uterus in women). Watch your condition for any changes. Tell your doctor if you notice any. Keep all follow-up visits as told by your doctor. This is important. Contact a doctor if: You have pain that gets worse. You have a fever. You have not pooped for 4 days. You vomit. You are not hungry. You lose weight. You are bleeding from the opening of the butt (anus). You have thin, pencil-like poop. Get help right away if: You have a fever, and your symptoms suddenly get worse. You leak poop or have blood in your poop. Your belly feels hard or bigger than normal (bloated). You have very bad belly pain. You feel dizzy or you faint. Summary Constipation is when a person poops fewer than 3 times a week, has trouble pooping, or has poop that is dry, hard, or bigger than normal. Eat foods that have a lot of fiber. Drink enough fluid to keep your pee (urine) pale yellow. Take over-the-counter and prescription medicines only as told by your doctor. These include any fiber supplements. This information is not intended to replace advice given to you by your health care provider. Make sure you discuss any questions you have with your health care provider. Document Revised: 06/09/2022 Document Reviewed: 06/09/2022 Elsevier Patient Education  2024 Elsevier Inc.  Management of Memory Problems  There are some general things you can do to help manage your memory problems.  Your memory may not in fact recover, but by  using techniques and strategies you will be able to manage your memory difficulties better.  1)  Establish a routine. Try to establish and then stick to a regular routine.  By doing this, you will get used to what to expect and you will reduce the need to rely on your memory.  Also, try to do things at the same time of day, such as taking  your medication or checking your calendar first thing in the morning. Think about think that you can do as a part of a regular routine and make a list.  Then enter them into a daily planner to remind you.  This will help you establish a routine.  2)  Organize your environment. Organize your environment so that it is uncluttered.  Decrease visual stimulation.  Place everyday items such as keys or cell phone in the same place every day (ie.  Basket next to front door) Use post it notes with a brief message to yourself (ie. Turn off light, lock the door) Use labels to indicate where things go (ie. Which cupboards are for food, dishes, etc.) Keep a notepad and pen by the telephone to take messages  3)  Memory Aids A diary or journal/notebook/daily planner Making a list (shopping list, chore list, to do list that needs to be done) Using an alarm as a reminder (kitchen timer or cell phone alarm) Using cell phone to store information (Notes, Calendar, Reminders) Calendar/White board placed in a prominent position Post-it notes  In order for memory aids to be useful, you need to have good habits.  It's no good remembering to make a note in your journal if you don't remember to look in it.  Try setting aside a certain time of day to look in journal.  4)  Improving mood and managing fatigue. There may be other factors that contribute to memory difficulties.  Factors, such as anxiety, depression and tiredness can affect memory. Regular gentle exercise can help improve your mood and give you more energy. Simple relaxation techniques may help relieve symptoms of anxiety Try to get back to completing activities or hobbies you enjoyed doing in the past. Learn to pace yourself through activities to decrease fatigue. Find out about some local support groups where you can share experiences with others. Try and achieve 7-8 hours of sleep at night.

## 2024-05-31 ENCOUNTER — Telehealth: Payer: Self-pay

## 2024-05-31 NOTE — Progress Notes (Signed)
 Complex Care Management Note Care Guide Note  05/31/2024 Name: James Pearson MRN: [id number] DOB: 18-Sep-1945   Complex Care Management Outreach Attempts: An unsuccessful telephone outreach was attempted today to offer the patient information about available complex care management services.  Follow Up Plan:  Additional outreach attempts will be made to offer the patient complex care management information and services.   Encounter Outcome:  No Answer  Dreama Lynwood [facility]  [facility], [facility] VBCI Assistant Direct Dial: [phone]  Fax: [phone]

## 2024-05-31 NOTE — Telephone Encounter (Signed)
 Copied from CRM [id number]. Topic: Clinical - Lab/Test Results >> May 30, 2024  4:39 PM Rea ORN wrote: Reason for CRM: Pt called back for results. Results given and pt stated he would like a urologist in [place] or whoever has soonest appt. Pt is still having incontinence episodes.

## 2024-05-31 NOTE — Telephone Encounter (Signed)
 Referral placed.

## 2024-05-31 NOTE — Telephone Encounter (Signed)
-----   Message from Glendora CHRISTELLA Kleine, [place] sent at 05/31/2024  9:14 AM EDT -----

## 2024-06-06 ENCOUNTER — Ambulatory Visit (INDEPENDENT_AMBULATORY_CARE_PROVIDER_SITE_OTHER): Admitting: Orthopaedic Surgery

## 2024-06-06 DIAGNOSIS — M12812 Other specific arthropathies, not elsewhere classified, left shoulder: Secondary | ICD-10-CM

## 2024-06-06 DIAGNOSIS — M75102 Unspecified rotator cuff tear or rupture of left shoulder, not specified as traumatic: Secondary | ICD-10-CM

## 2024-06-06 NOTE — Progress Notes (Unsigned)
 Complex Care Management Note Care Guide Note  06/06/2024 Name: James Pearson MRN: [id number] DOB: 1945/08/27   Complex Care Management Outreach Attempts: A second unsuccessful outreach was attempted today to offer the patient with information about available complex care management services.  Follow Up Plan:  Additional outreach attempts will be made to offer the patient complex care management information and services.   Encounter Outcome:  No Answer  Dreama Lynwood [facility]  [facility], [facility] VBCI Assistant Direct Dial: [phone]  Fax: [phone]

## 2024-06-06 NOTE — Progress Notes (Signed)
 Chief Complaint: Left shoulder pain     History of Present Illness:   06/06/2024: Presents today for follow-up of the left shoulder.  He is here today with his wife for further discussion  James Pearson is a 78 y.o. male presents today as a referral from Dr. Jerri.  He is status post left reverse shoulder arthroplasty in 2024 with Dr. Melita.  Since that time he has had limited overhead range of motion and pain at rest.  He did get inflammatory labs which were obtained at [facility] [place]  [facility] which did not show any evidence of elevation.  He is here today for further discussion.  He has trialed physical therapy postop without any relief.    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   Stroke (HCC) 08/10/2011   Past Surgical History:  Procedure Laterality Date   CHOLECYSTECTOMY     HAND SURGERY     9 subsequent surgeries   REVERSE SHOULDER ARTHROPLASTY Left 01/27/2023   Procedure: REVERSE SHOULDER ARTHROPLASTY;  Surgeon: Melita Drivers, MD;  Location: [facility];  Service: Orthopedics;  Laterality: Left;    rotator cuff surgery Right    Social History   Socioeconomic History   Marital status: Widowed    Spouse name: Not on file   Number of children: Not on file   Years of education: Not on file   Highest education level: Not on file  Occupational History   Not on file  Tobacco Use   Smoking status: Former    Current packs/day: 0.00    Types: Cigarettes    Quit date: 08/09/1990    Years since quitting: 33.8   Smokeless tobacco: Never  Vaping Use   Vaping status: Never Used  Substance and Sexual Activity   Alcohol use: No    Alcohol/week: 0.0 standard drinks of alcohol   Drug use: No   Sexual activity: Yes  Other Topics  Concern   Not on file  Social History Narrative   Caffeine rare.  So lost wife recently back in 05/20/2021 (cancer).  Veteran.  Working : retired.     Social Drivers of Corporate Investment Banker Strain: High Risk (04/18/2024)   Overall Financial Resource Strain (CARDIA)    Difficulty of Paying Living Expenses: Hard  Food Insecurity: No Food Insecurity (04/18/2024)   Hunger Vital Sign    Worried About Running Out of Food in the Last Year: Never true    Ran Out of Food in the Last Year: Never true  Transportation Needs: No Transportation Needs (04/18/2024)   PRAPARE - Administrator, Civil Service (Medical): No    Lack of Transportation (Non-Medical): No  Physical Activity: Sufficiently Active (04/18/2024)   Exercise Vital Sign    Days of Exercise per Week: 4 days    Minutes of Exercise per Session: 60 min  Recent Concern: Physical Activity - Inactive (04/02/2024)   Exercise Vital Sign    Days of Exercise per Week: 0 days    Minutes of Exercise per Session: 0 min  Stress: Stress Concern Present (04/18/2024)   [employer] of Occupational Health - Occupational Stress Questionnaire    Feeling of Stress: To some extent  Social Connections: Moderately Integrated (04/18/2024)   Social Connection and Isolation Panel    Frequency of Communication with Friends and Family: More than three times a week    Frequency of Social Gatherings with Friends and Family: Twice a week    Attends Religious Services: More than 4 times per year    Active Member of [employer] or Organizations: Yes    Attends Banker Meetings: More than 4 times per year    Marital Status: Widowed   Family History  Problem Relation Age of Onset   Heart disease Mother    Hypertension Mother    Tremor Father    COPD Sister    Heart disease Sister    Hypertension Sister    Allergies  Allergen Reactions   Omeprazole Anaphylaxis   Gabapentin Other (See Comments)    Unknown   Ibuprofen Nausea And  Vomiting    Other reaction(s): Low blood pressure, NAUSEA,VOMITING, upset stomach   Ketorolac Tromethamine     Other reaction(s): Low blood pressure   Meloxicam Nausea And Vomiting   Morphine Nausea And Vomiting   Nsaids Nausea And Vomiting   Oxybutynin Chloride     Other reaction(s): Xerostomia, Dry eyes, Dry skin, Xerostomia, Dry eyes, Dry skin, Xerostomia, Dry eyes, Dry skin   Current Outpatient Medications  Medication Sig Dispense Refill   amLODipine  (NORVASC ) 5 MG tablet TAKE 1 TABLET BY MOUTH DAILY 100 tablet 2   aspirin  81 MG chewable tablet Chew 81 mg by mouth in the morning.     aspirin  EC 325 MG tablet Take 1 tablet (325 mg total) by mouth daily. (Patient not taking: Reported on 05/30/2024) 14 tablet 0   atorvastatin  (LIPITOR) 80 MG tablet TAKE 1 TABLET BY MOUTH ONCE  DAILY 100 tablet 2   Berberine Chloride (BERBERINE HCI PO) Take 1,200 mg by mouth daily.     CALCIUM -VITAMIN D  PO Take by mouth. Calcium  600 mg and vitamin d  400 mg     cyclobenzaprine  (FLEXERIL ) 10 MG tablet Take 1 tablet (10 mg total) by mouth 3 (three) times daily as needed for muscle spasms. (Patient not taking: Reported on 05/30/2024) 30 tablet 1   docusate sodium (COLACE) 100 MG capsule Take 100 mg by mouth daily.     enalapril  (VASOTEC ) 20 MG tablet TAKE 2 TABLETS BY MOUTH ONCE  DAILY 200 tablet 2   Fluocinolone  Acetonide Body 0.01 % OIL Apply to scalp, face and ears QD to BID 120 mL 5   hydrochlorothiazide  (HYDRODIURIL ) 12.5 MG tablet TAKE 1 TABLET BY MOUTH DAILY 100 tablet 2   Meclizine HCl 25 MG CHEW Chew 25 mg by mouth daily as needed.     metFORMIN  (GLUCOPHAGE -XR) 500 MG 24 hr tablet TAKE 1 TABLET BY MOUTH DAILY  WITH BREAKFAST 100 tablet 2   Multiple Vitamins-Minerals (MULTIVITAMIN WITH MINERALS) tablet Take 1 tablet by mouth daily. Centrum silver 50+     naloxone (NARCAN) nasal spray 4 mg/0.1 mL Place 1 spray into the nose once.     ondansetron  (ZOFRAN ) 4 MG tablet Take 1 tablet (4 mg total) by mouth  every 8 (eight) hours as needed for nausea or vomiting. 10 tablet 0   oxyCODONE  (OXY IR/ROXICODONE ) 5 MG immediate release tablet Take 5-10 mg by mouth every 6 (six) hours as needed.     oxyCODONE  (ROXICODONE ) 5 MG immediate release tablet Take 1 tablet (5 mg total) by mouth every 4 (four) hours as needed for severe pain (pain score 7-10) or breakthrough  pain. 10 tablet 0   polyethylene glycol (MIRALAX / GLYCOLAX) 17 g packet Take 17 g by mouth daily as needed.     propranolol (INDERAL) 20 MG tablet Take 20 mg by mouth daily. Received from [facility]     sertraline  (ZOLOFT ) 50 MG tablet TAKE 1 TABLET BY MOUTH DAILY 90 tablet 3   sildenafil (VIAGRA) 100 MG tablet Take 50-100 mg by mouth daily as needed for erectile dysfunction.     Specialty Vitamins Products (PROSTATE PO) Take 250 mg by mouth at bedtime. supra beta prostate     tamsulosin  (FLOMAX ) 0.4 MG CAPS capsule TAKE 2 CAPSULES BY MOUTH DAILY 200 capsule 2   testosterone cypionate (DEPOTESTOSTERONE CYPIONATE) 200 MG/ML injection Inject 1 mL into the muscle every 28 (twenty-eight) days.     tobramycin-dexamethasone  (TOBRADEX) ophthalmic solution Place 1 drop into the left eye daily as needed (eye infection).     traZODone  (DESYREL ) 50 MG tablet TAKE 1 TABLET BY MOUTH AT  BEDTIME AS NEEDED FOR SLEEP 90 tablet 3   TURMERIC PO Take 1 capsule by mouth daily as needed (Knee pain).     No current facility-administered medications for this visit.   No results found.  Review of Systems:   A ROS was performed including pertinent positives and negatives as documented in the HPI.  Physical Exam :   Constitutional: NAD and appears stated age Neurological: Alert and oriented Psych: Appropriate affect and cooperative There were no vitals taken for this visit.   Comprehensive Musculoskeletal Exam:    Left shoulder with active forward elevation to 60 degrees.  External rotation at the side is to 10 degrees.  Internal rotation is to front pocket.  He does  fire all 3 heads of the deltoid without diminished sensation in the axillary distribution.  Remainder of distal neurosensory exam is intact   Imaging:   Xray (2 views left shoulder, CT scan left shoulder): There is notching of the inferior glenoid    I personally reviewed and interpreted the radiographs.   Assessment and Plan:   78 y.o. male status post left reverse shoulder arthroplasty in 2024 with subsequent notching of the inferior glenoid.  Overall his overhead motion is quite limited.  I did discuss that I do believe ultimately he may benefit from revision of the glenoid component for distalization as well as augmentation in order to promote a more optimal reverse shoulder angle.  I did discuss risks and benefits associated with this as well as the associated recovery timeframe.  After discussion they would like to consider more  -Plan for possible revision glenoid component left reverse shoulder arthroplasty   After a lengthy discussion of treatment options, including risks, benefits, alternatives, complications of surgical and nonsurgical conservative options, the patient elected surgical repair.   The patient  is aware of the material risks  and complications including, but not limited to injury to adjacent structures, neurovascular injury, infection, numbness, bleeding, implant failure, thermal burns, stiffness, persistent pain, failure to heal, disease transmission from allograft, need for further surgery, dislocation, anesthetic risks, blood clots, risks of death,and others. The probabilities of surgical success and failure discussed with patient given their particular co-morbidities.The time and nature of expected rehabilitation and recovery was discussed.The patient's questions were all answered preoperatively.  No barriers to understanding were noted. I explained the natural history of the disease process and Rx rationale.  I explained to the patient what I considered to be  reasonable expectations given their personal situation.  The  final treatment plan was arrived at through a shared patient decision making process model.    I personally saw and evaluated the patient, and participated in the management and treatment plan.  Elspeth Parker, MD Attending Physician, Orthopedic Surgery  This document was dictated using Dragon voice recognition software. A reasonable attempt at proof reading has been made to minimize errors.

## 2024-06-07 ENCOUNTER — Telehealth: Payer: Self-pay | Admitting: Nurse Practitioner

## 2024-06-07 DIAGNOSIS — N3941 Urge incontinence: Secondary | ICD-10-CM

## 2024-06-07 NOTE — Telephone Encounter (Signed)
 Copied from CRM [id number]. Topic: Referral - Request for Referral >> Jun 07, 2024  3:19 PM Rea C wrote: Did the patient discuss referral with their provider in the last year? Yes (If No - schedule appointment) (If Yes - send message)  Appointment offered?   Type of order/referral and detailed reason for visit: Urology in [place]  Preference of office, provider, location: [place]. Patient is scheduled for an appointment with Urology in [place] but the appointment is scheduled for [place]. Patient needs something sooner and is wondering if [facility] urology would have a sooner appointment.   If referral order, have you been seen by this specialty before? Yes (If Yes, this issue or another issue? When? Where?  Can we respond through MyChart? MyChart or phone call

## 2024-06-07 NOTE — Progress Notes (Signed)
 Complex Care Management Note  Care Guide Note 06/07/2024 Name: James Pearson MRN: [id number] DOB: October 21, 1945  James Pearson is a 78 y.o. year old male who sees Cable, Lynwood HERO, NP for primary care. I reached out to Nancyann Favorite by phone today to offer complex care management services.  [name] was given information about Complex Care Management services today including:   The Complex Care Management services include support from the care team which includes your Nurse Care Manager, Clinical Social Worker, or Pharmacist.  The Complex Care Management team is here to help remove barriers to the health concerns and goals most important to you. Complex Care Management services are voluntary, and the patient may decline or stop services at any time by request to their care team member.   Complex Care Management Consent Status: Patient agreed to services and verbal consent obtained.   Follow up plan:  Telephone appointment with complex care management team member scheduled for:  06/08/24 at 11:00 a.m.   Encounter Outcome:  Patient Scheduled  Dreama [facility]wood [facility]  [facility], [facility] VBCI Assistant Direct Dial: [phone]  Fax: [phone]

## 2024-06-08 ENCOUNTER — Other Ambulatory Visit

## 2024-06-08 ENCOUNTER — Encounter: Payer: Self-pay | Admitting: Pharmacist

## 2024-06-08 NOTE — Progress Notes (Deleted)
 06/08/2024 Name: James Pearson MRN: [id number] DOB: 06/03/1946  Subjective  No chief complaint on file.   Care Team: Primary Care Provider: Wendee Lynwood HERO, NP  Reason for visit: James Pearson is a 78 y.o. male who presents today for a phone visit with the pharmacist. They were referred by their PCP for assistance with transitioning to pill packs/compliance packaging. Patient having memory issues and missing meds, taking at wrong times.     Medication Access/Adherence: ?  Prescription drug coverage: Payor: MULTIMEDIA PROGRAMMER / Plan: UHC MEDICARE / Product Type: *No Product type* / .   - Reports that all medications are affordable: {YES/NO:21197} - Medication packaging: {LZ med packaging:31508} - Patient manages their own medications/refills: {YES/NO:21197} - Uses Pillbox: {YES/NO:21197} - Missed medication doses: {LZ med adherence:31509}  HPI: Patient has*** their medications with then at the time of the visit.   Outpatient Encounter Medications as of 06/08/2024  Medication Sig   amLODipine  (NORVASC ) 5 MG tablet TAKE 1 TABLET BY MOUTH DAILY   aspirin  81 MG chewable tablet Chew 81 mg by mouth in the morning.   aspirin  EC 325 MG tablet Take 1 tablet (325 mg total) by mouth daily. (Patient not taking: Reported on 05/30/2024)   atorvastatin  (LIPITOR) 80 MG tablet TAKE 1 TABLET BY MOUTH ONCE  DAILY   Berberine Chloride (BERBERINE HCI PO) Take 1,200 mg by mouth daily.   CALCIUM -VITAMIN D  PO Take by mouth. Calcium  600 mg and vitamin d  400 mg   cyclobenzaprine  (FLEXERIL ) 10 MG tablet Take 1 tablet (10 mg total) by mouth 3 (three) times daily as needed for muscle spasms. (Patient not taking: Reported on 05/30/2024)   docusate sodium (COLACE) 100 MG capsule Take 100 mg by mouth daily.   enalapril  (VASOTEC ) 20 MG tablet TAKE 2 TABLETS BY MOUTH ONCE  DAILY   Fluocinolone  Acetonide Body 0.01 % OIL Apply to scalp, face and ears QD to BID   hydrochlorothiazide  (HYDRODIURIL ) 12.5 MG  tablet TAKE 1 TABLET BY MOUTH DAILY   Meclizine HCl 25 MG CHEW Chew 25 mg by mouth daily as needed.   metFORMIN  (GLUCOPHAGE -XR) 500 MG 24 hr tablet TAKE 1 TABLET BY MOUTH DAILY  WITH BREAKFAST   Multiple Vitamins-Minerals (MULTIVITAMIN WITH MINERALS) tablet Take 1 tablet by mouth daily. Centrum silver 50+   naloxone (NARCAN) nasal spray 4 mg/0.1 mL Place 1 spray into the nose once.   ondansetron  (ZOFRAN ) 4 MG tablet Take 1 tablet (4 mg total) by mouth every 8 (eight) hours as needed for nausea or vomiting.   oxyCODONE  (OXY IR/ROXICODONE ) 5 MG immediate release tablet Take 5-10 mg by mouth every 6 (six) hours as needed.   oxyCODONE  (ROXICODONE ) 5 MG immediate release tablet Take 1 tablet (5 mg total) by mouth every 4 (four) hours as needed for severe pain (pain score 7-10) or breakthrough pain.   polyethylene glycol (MIRALAX / GLYCOLAX) 17 g packet Take 17 g by mouth daily as needed.   propranolol (INDERAL) 20 MG tablet Take 20 mg by mouth daily. Received from [facility]   sertraline  (ZOLOFT ) 50 MG tablet TAKE 1 TABLET BY MOUTH DAILY   sildenafil (VIAGRA) 100 MG tablet Take 50-100 mg by mouth daily as needed for erectile dysfunction.   Specialty Vitamins Products (PROSTATE PO) Take 250 mg by mouth at bedtime. supra beta prostate   tamsulosin  (FLOMAX ) 0.4 MG CAPS capsule TAKE 2 CAPSULES BY MOUTH DAILY   testosterone cypionate (DEPOTESTOSTERONE CYPIONATE) 200 MG/ML injection Inject 1 mL into the muscle every  28 (twenty-eight) days.   tobramycin-dexamethasone  (TOBRADEX) ophthalmic solution Place 1 drop into the left eye daily as needed (eye infection).   traZODone  (DESYREL ) 50 MG tablet TAKE 1 TABLET BY MOUTH AT  BEDTIME AS NEEDED FOR SLEEP   TURMERIC PO Take 1 capsule by mouth daily as needed (Knee pain).   No facility-administered encounter medications on file as of 06/08/2024.     Assessment and Plan:   Medication Access All medications were reviewed with patient today and medication list was updated  as appropriate.  Barriers identified / Adherence concerns: *** ***   Medications as of ***  ? Medication name Instructions  Notes   Diabetes***      ***     ***     ***     ***     ***     ***     Hypertension***     ***     ***     ***     ***     ***     Hyperlipidemia***     ***     ***     ***     ***     ***     ***     ***     ***     ***     ***     ***     ***     ***     ***     ***       Future Appointments  Date Time Provider Department Center  06/08/2024 11:00 AM [facility] PHARMACIST [facility] [address]  06/19/2024  3:30 PM James Lands, RN [facility] None  09/03/2024  1:30 PM James Hamilton, MD [facility] None  09/05/2024  2:45 PM James Standing, MD [facility] [address]  11/19/2024  2:00 PM James Lynwood HERO, NP [facility] [address]  04/08/2025  1:40 PM [facility] ANNUAL WELLNESS VISIT 1 [facility] [address]    James Pearson, PharmD Clinical Pharmacist [facility] [phone]

## 2024-06-08 NOTE — Addendum Note (Signed)
 Addended by: WENDEE LYNWOOD HERO on: 06/08/2024 08:46 AM   Modules accepted: Orders

## 2024-06-08 NOTE — Telephone Encounter (Signed)
 Referral placed.

## 2024-06-08 NOTE — Progress Notes (Signed)
 Attempted to contact patient for scheduled appointment for medication management. Left HIPAA compliant message for patient to return my call at their convenience. Also sent detailed MyChart message.

## 2024-06-11 ENCOUNTER — Encounter: Payer: Self-pay | Admitting: Radiology

## 2024-06-14 ENCOUNTER — Telehealth: Payer: Self-pay

## 2024-06-14 NOTE — Progress Notes (Unsigned)
 Complex Care Management Note Care Guide Note  06/14/2024 Name: James Pearson MRN: [id number] DOB: 08/10/1945   Complex Care Management Outreach Attempts: An unsuccessful telephone outreach was attempted today to offer the patient information about available complex care management services.  Follow Up Plan:  Additional outreach attempts will be made to offer the patient complex care management information and services.   Encounter Outcome:  No Answer  Dreama Lynwood [facility]  [facility], [facility] VBCI Assistant Direct Dial: [phone]  Fax: [phone]

## 2024-06-15 NOTE — Progress Notes (Signed)
 Complex Care Management Note Care Guide Note  06/15/2024 Name: James Pearson MRN: [id number] DOB: 1945-08-14   Complex Care Management Outreach Attempts: A second unsuccessful outreach was attempted today to offer the patient with information about available complex care management services.  Follow Up Plan:  Additional outreach attempts will be made to offer the patient complex care management information and services.   Encounter Outcome:  No Answer  Dreama Lynwood [facility]  [facility], [facility] VBCI Assistant Direct Dial: [phone]  Fax: [phone]

## 2024-06-19 ENCOUNTER — Other Ambulatory Visit: Payer: Self-pay

## 2024-06-19 ENCOUNTER — Other Ambulatory Visit: Payer: Self-pay | Admitting: Pharmacist

## 2024-06-19 DIAGNOSIS — Z760 Encounter for issue of repeat prescription: Secondary | ICD-10-CM

## 2024-06-19 DIAGNOSIS — F320 Major depressive disorder, single episode, mild: Secondary | ICD-10-CM

## 2024-06-19 DIAGNOSIS — E7800 Pure hypercholesterolemia, unspecified: Secondary | ICD-10-CM

## 2024-06-19 DIAGNOSIS — I10 Essential (primary) hypertension: Secondary | ICD-10-CM

## 2024-06-19 NOTE — Patient Instructions (Signed)
 Visit Information  Thank you for taking time to visit with me today. Please don't hesitate to contact me if I can be of assistance to you before our next scheduled appointment.  Your next care management appointment is by telephone on 07/20/2024  at 3:00 pm  Telephone follow-up in 1 month  Please call the care guide team at [phone] if you need to cancel, schedule, or reschedule an appointment.   Please call the Suicide and Crisis Lifeline: 988 call the [place]  National Suicide Prevention Lifeline: (612)481-7175 or TTY: 818 013 2894 TTY 272-843-9587) to talk to a trained counselor call 1-800-273-TALK (toll free, 24 hour hotline) go to [facility] [address], [place] [phone]) call 911 if you are experiencing a Mental Health or Behavioral Health Crisis or need someone to talk to.  Nestora Duos, MSN, RN [facility], [facility] RN Care Manager Direct Dial: [phone] Fax: [phone]'

## 2024-06-19 NOTE — Patient Outreach (Signed)
 Complex Care Management   Visit Note  06/19/2024  Name:  James Pearson MRN: [id number] DOB: 1946-02-17  Situation: Referral received for Complex Care Management related to Pain Falls I obtained verbal consent from Patient.  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 Evonnie Movement Specialist - patient has call block - given number for Dr Tat's office to call for appointment, and advised to call when gets referral since does answer phone if unknown number, Neurp states not presenting like Parkinson's Neurological Management Strategies: Medication therapy Neurological Comment: dizziness unchanged  HEENT HEENT Symptoms Reported: Not assessed HEENT Management Strategies: Routine screening    Cardiovascular Cardiovascular Symptoms Reported: No symptoms reported Cardiovascular Management Strategies: Medication therapy, Routine screening  Respiratory Respiratory Symptoms Reported: Shortness of breath Other Respiratory Symptoms: occasional SOB with activity/exercising Respiratory Management Strategies: Routine screening  Endocrine Endocrine Symptoms Reported: No symptoms reported Is patient diabetic?: Yes Is patient checking blood sugars at home?: Yes List most recent blood sugar readings, include date and time of day: Last  reading 119 about 2 James Pearson ago around 10/14 - metformin  only if feels high    Gastrointestinal Additional Gastrointestinal Details: reports had food that upset stomach yesterday, took Pepto and Miralax - diarrhea, reminded Miralax is to help have a BM/for constipation Gastrointestinal Management Strategies: Medication therapy    Genitourinary Genitourinary Symptoms Reported: Incontinence, Frequency    Integumentary      Musculoskeletal Musculoskelatal Symptoms Reviewed: Back pain, Joint pain, Muscle pain Other Musculoskeletal Symptoms: [employer] for exercise, TENS unit not as much, pain in shoulder - unsure about surgery Musculoskeletal Management Strategies: Medication therapy, Exercise Falls in the past year?: No Number of falls in past year: 1 or less Was there an injury with Fall?: No Fall Risk Category Calculator: 0 Patient Fall Risk Level: Low Fall Risk Patient at Risk for Falls Due to: Impaired balance/gait Fall risk Follow up: Falls evaluation completed, Falls prevention discussed  Psychosocial Psychosocial Symptoms Reported: Depression - if selected complete PHQ 2-9, Sadness - if selected complete PHQ 2-9 Additional Psychological Details: missed several appointments LCSW and BSW - phone does not ring due to call block, does not check VM regularly and states pooor memory forgets appointments, given direct number to put in phone, patient states fine living alsone without help deite memory issues          06/19/2024    PHQ2-9 Depression Screening   Little interest or pleasure in doing things Several days  Feeling down, depressed, or hopeless Several days  PHQ-2 - Total Score 2  Trouble falling or staying asleep, or sleeping too much    Feeling tired or having little energy    Poor appetite or overeating     Feeling bad about yourself - or that you are a failure or have let yourself or your family down  Trouble concentrating on things, such as reading the newspaper or watching  television    Moving or speaking so slowly that other people could have noticed.  Or the opposite - being so fidgety or restless that you have been moving around a lot more than usual    Thoughts that you would be better off dead, or hurting yourself in some way    PHQ2-9 Total Score    If you checked off any problems, how difficult have these problems made it for you to do your work, take care of things at home, or get along with other people    Depression Interventions/Treatment      There were no vitals filed for this visit.    Medications Reviewed Today     Reviewed by Devra Lands, RN (Registered Nurse) on 06/19/24 at 1548  Med List Status: <None>   Medication Order Taking? Sig Documenting Provider Last Dose Status Informant  amLODipine  (NORVASC ) 5 MG tablet [id number] Yes TAKE 1 TABLET BY MOUTH DAILY Wendee Lynwood HERO, NP  Active   aspirin  81 MG chewable tablet [id number] Yes Chew 81 mg by mouth in the morning. [provider]  Active Self  aspirin  EC 325 MG tablet [id number]  Take 1 tablet (325 mg total) by mouth daily.  Patient not taking: Reported on 05/30/2024   Genelle Standing, MD  Active   atorvastatin  (LIPITOR) 80 MG tablet [id number] Yes TAKE 1 TABLET BY MOUTH ONCE  DAILY Wendee Lynwood HERO, NP  Active   Berberine Chloride (BERBERINE HCI PO) [id number] Yes Take 1,200 mg by mouth daily. [provider]  Active   CALCIUM -VITAMIN D  PO [id number] Yes Take by mouth. Calcium  600 mg and vitamin d  400 mg [provider]  Active Self  cyclobenzaprine  (FLEXERIL ) 10 MG tablet [id number]  Take 1 tablet (10 mg total) by mouth 3 (three) times daily as needed for muscle spasms.  Patient not taking: Reported on 05/30/2024   Garland Sora, PA-C  Active   docusate sodium (COLACE) 100 MG capsule [id number] Yes Take 100 mg by mouth daily. [provider]  Active Self  enalapril  (VASOTEC ) 20 MG tablet [id number] Yes TAKE 2 TABLETS BY MOUTH ONCE  DAILY Wendee Lynwood HERO, NP   Active   Fluocinolone  Acetonide Body 0.01 % OIL [id number] Yes Apply to scalp, face and ears QD to BID Jackquline Sawyer, MD  Active Self  hydrochlorothiazide  (HYDRODIURIL ) 12.5 MG tablet [id number] Yes TAKE 1 TABLET BY MOUTH DAILY Wendee Lynwood HERO, NP  Active   Meclizine HCl 25 MG CHEW [id number] Yes Chew 25 mg by mouth daily as needed. [provider]  Active   metFORMIN  (GLUCOPHAGE -XR) 500 MG 24 hr tablet [id number] Yes TAKE 1 TABLET BY MOUTH DAILY  WITH BREAKFAST  Patient taking differently: Take 500 mg by mouth daily with breakfast. Only takes if BG elevated   Wendee Lynwood HERO, NP  Active   Multiple Vitamins-Minerals (MULTIVITAMIN WITH MINERALS) tablet [id number] Yes Take 1 tablet by mouth daily. Centrum silver 50+ [provider]  Active Self  naloxone (NARCAN) nasal spray 4 mg/0.1 mL [id number] Yes Place 1 spray into the nose once. [provider]  Active   ondansetron  (ZOFRAN ) 4 MG tablet [id number] Yes Take 1 tablet (4 mg total) by mouth every 8 (eight) hours as needed for nausea or vomiting. Shuford, Sora, PA-C  Active   oxyCODONE  (OXY IR/ROXICODONE ) 5 MG immediate release tablet [id number] Yes Take 5-10 mg by mouth every 6 (six) hours as needed.  [provider]  Active   oxyCODONE  (ROXICODONE ) 5 MG immediate release tablet [id number]  Take 1 tablet (5 mg total) by mouth every 4 (four) hours as needed for severe pain (pain score 7-10) or breakthrough pain. Genelle Standing, MD  Active   polyethylene glycol (MIRALAX / GLYCOLAX) 17 g packet [id number] Yes Take 17 g by mouth daily as needed. [provider]  Active   propranolol (INDERAL) 20 MG tablet [id number] Yes Take 20 mg by mouth daily. Received from [facility] [provider]  Active   sertraline  (ZOLOFT ) 50 MG tablet [id number] Yes TAKE 1 TABLET BY MOUTH DAILY Wendee Lynwood HERO, NP  Active   sildenafil (VIAGRA) 100 MG tablet [id number] Yes Take 50-100 mg by mouth daily as needed for erectile dysfunction. [provider]  Active Self  Specialty Vitamins Products (PROSTATE PO) [id number] Yes Take 250 mg by mouth at bedtime. supra beta prostate [provider]  Active Self  tamsulosin  (FLOMAX ) 0.4 MG CAPS capsule [id number] Yes TAKE 2 CAPSULES BY MOUTH DAILY Wendee Lynwood HERO, NP  Active   testosterone cypionate (DEPOTESTOSTERONE CYPIONATE) 200 MG/ML injection [id number]  Inject 1 mL into the muscle every 28 (twenty-eight) days.  Patient not taking: Reported on 06/19/2024   [provider]  Active Self  tobramycin-dexamethasone  [employer]) ophthalmic solution [id number]  Place 1 drop into the left eye daily as needed (eye infection).  Patient not taking: Reported on 06/19/2024   [provider]  Active Self  traZODone  (DESYREL ) 50 MG tablet [id number] Yes TAKE 1 TABLET BY MOUTH AT  BEDTIME AS NEEDED FOR SLEEP Wendee Lynwood HERO, NP  Active   TURMERIC PO [id number] Yes Take 1 capsule by mouth daily as needed (Knee pain). [provider]  Active Self            Recommendation:   PCP Follow-up  Follow Up Plan:   Telephone follow-up in 1 month  Nestora Duos, MSN, RN [facility], [facility] RN Care Manager Direct Dial: [phone] Fax: [phone]'

## 2024-06-19 NOTE — Progress Notes (Signed)
   06/20/2024 Name: James Pearson MRN: [id number] DOB: [birth year]  Subjective  Chief Complaint  Patient presents with   Medication Reconcilliation    Care Team: Primary Care Provider: Wendee Lynwood HERO, NP  Reason for visit: ?  James Pearson is a 78 y.o. male who presents today for a telephone visit with the pharmacist due to medication concerns regarding compliance. ?   Medication Access: ?  Reports that all medications are affordable. Currently manages his own medications. Used to also manage wife's before she passed. Feels somewhat confident managing his medicating though notes has become more forgetful lately making this more difficult. Is very open to transition to pill packs or similar to help with tracking/remembering his doses.   Prescription drug coverage: YES Payor: [employer] / Plan: [facility] MEDICARE / Product Type: *No Product type* / .   Current Patient Assistance: N/A  Current medication list was reviewed with patient in detail.  Discrepancies Noted: Preference to not package metformin . Reports he is taking as needed. Feels berberine supplement works better for him.  Tamsulosin , is replacing with OTC prostate herbal - don't package  States he saw neuro yesterday and they discussed increasing propranolol from 20 to 40 mg daily though plan note states to continue 20 mg daily. Regardless[facility]   Trazodone  is nightly. Patient okay if packed in a bottle given PRN instruction for use   Current schedule: AM - enalapril , hydrochlorothiazide , aspirin , amlodipine  PM - atorvastatin , sertraline , propranolol  Medication obtained at the [place]:  oxycodone , testosterone, propranolol  Assessment and Plan:   1. Medication Access Patient interested in pill packs through [facility]. Test claim for generic = $0, no preferred pharmacy rejection Discussed exclusion of OTC supplements or meds taken irregularly.  Notably,  patient reports preference to leave out tamsulosin  and metformin  as he is taking natural supplements for these things. States he will consider adding them to pill packs if worsened symptoms or A1c.  Medications prescribed by PCP - Refills pended to James Pearson for pill packs/home delivery Atorvastatin , amlodipine , enalapril , hydrochlorothiazide   Chronic Medications from [place], continue getting from the [facility] Propranolol (tremor)  Future Appointments  Date Time Provider Department Center  07/17/2024 10:00 AM James Pearson [facility] None  07/20/2024  3:00 PM James Lands, RN [facility] None  09/03/2024  1:30 PM James Hamilton, MD [facility] None  09/05/2024  2:45 PM James Standing, MD [facility] [address]  11/19/2024  2:00 PM James Lynwood HERO, NP [facility] [address]  04/08/2025  1:40 PM [facility] ANNUAL WELLNESS VISIT 1 [facility] [address]    James Pearson, PharmD Clinical Pharmacist [facility] [phone]

## 2024-06-20 ENCOUNTER — Other Ambulatory Visit: Payer: Self-pay

## 2024-06-20 ENCOUNTER — Other Ambulatory Visit (HOSPITAL_COMMUNITY): Payer: Self-pay

## 2024-06-20 MED ORDER — ENALAPRIL MALEATE 20 MG PO TABS
40.0000 mg | ORAL_TABLET | Freq: Every day | ORAL | 11 refills | Status: DC
  Filled 2024-06-20 – 2024-08-13 (×3): qty 60, 30d supply, fill #0

## 2024-06-20 MED ORDER — ATORVASTATIN CALCIUM 80 MG PO TABS
80.0000 mg | ORAL_TABLET | Freq: Every day | ORAL | 11 refills | Status: AC
  Filled 2024-06-20 – 2024-08-13 (×3): qty 30, 30d supply, fill #0

## 2024-06-20 MED ORDER — HYDROCHLOROTHIAZIDE 12.5 MG PO TABS
12.5000 mg | ORAL_TABLET | Freq: Every day | ORAL | 11 refills | Status: DC
  Filled 2024-06-20 – 2024-08-13 (×3): qty 30, 30d supply, fill #0

## 2024-06-20 MED ORDER — SERTRALINE HCL 50 MG PO TABS
50.0000 mg | ORAL_TABLET | Freq: Every day | ORAL | 11 refills | Status: AC
  Filled 2024-06-20 – 2024-08-13 (×3): qty 30, 30d supply, fill #0

## 2024-06-20 MED ORDER — AMLODIPINE BESYLATE 5 MG PO TABS
5.0000 mg | ORAL_TABLET | Freq: Every day | ORAL | 11 refills | Status: DC
  Filled 2024-06-20 – 2024-08-13 (×3): qty 30, 30d supply, fill #0

## 2024-06-25 ENCOUNTER — Other Ambulatory Visit: Payer: Self-pay

## 2024-06-30 ENCOUNTER — Other Ambulatory Visit (HOSPITAL_COMMUNITY): Payer: Self-pay

## 2024-07-04 ENCOUNTER — Other Ambulatory Visit (HOSPITAL_COMMUNITY): Payer: Self-pay

## 2024-07-04 ENCOUNTER — Other Ambulatory Visit: Payer: Self-pay

## 2024-07-06 ENCOUNTER — Other Ambulatory Visit: Payer: Self-pay

## 2024-07-17 ENCOUNTER — Other Ambulatory Visit: Payer: Self-pay | Admitting: *Deleted

## 2024-07-18 ENCOUNTER — Other Ambulatory Visit: Payer: Self-pay

## 2024-07-18 ENCOUNTER — Other Ambulatory Visit (HOSPITAL_COMMUNITY): Payer: Self-pay

## 2024-07-18 NOTE — Patient Outreach (Signed)
 Complex Care Management   Visit Note  07/18/2024  Name:  James Pearson MRN: [id number] DOB: [birth year]  Situation: Referral received for Complex Care Management related to grief support I obtained verbal consent from Patient.  Visit completed with Patient  on the phone on 07/17/24  Background:   Past Medical History:  Diagnosis Date   Arthritis    Diabetes mellitus without complication (HCC)    GERD (gastroesophageal reflux disease)    Hepatitis    Hypertension    Parkinson's disease (HCC)    Positive TB test    as a child   Stroke (HCC) 08/10/2011    Assessment: patient continues to make efforts to manage grief related to loss of spouse. Patient able to verbalized appropriate strategies to manage grief response. Patient Reported Symptoms:  Cognitive Cognitive Status: Struggling with memory recall (memory recall improving)   Health Maintenance Behaviors: Exercise, Annual physical exam Healing Pattern: Average Health Facilitated by: Rest  Neurological Neurological Review of Symptoms: Other: Neurological Comment: essentil tremors-referral to see Dr. Tiburcio but already established with  [facility] and [facility]-confirmed that he cannot be seen by Dr. Sidonie office  HEENT HEENT Symptoms Reported: No symptoms reported      Cardiovascular Cardiovascular Symptoms Reported: No symptoms reported    Respiratory Respiratory Symptoms Reported: No symptoms reported    Endocrine Endocrine Symptoms Reported: No symptoms reported Is patient diabetic?: Yes Is patient checking blood sugars at home?: Yes List most recent blood sugar readings, include date and time of day: 119 07/17/24 this morning    Gastrointestinal Gastrointestinal Symptoms Reported: No symptoms reported      Genitourinary Genitourinary Symptoms Reported: Itching or irritation Additional Genitourinary Details: itiching on back-given lotion from the [place] which helps    Integumentary Integumentary Symptoms  Reported: Itching    Musculoskeletal Musculoskelatal Symptoms Reviewed: Back pain Additional Musculoskeletal Details: chronic back, shoulder and knee pain Musculoskeletal Management Strategies: Medication therapy      Psychosocial Psychosocial Symptoms Reported: Depression - if selected complete PHQ 2-9 Additional Psychological Details: patient confirms mood has improved, continues to manage grief response following loss of spouse Behavioral Management Strategies: Activity, Adequate rest, Exercise, Coping strategies Major Change/Loss/Stressor/Fears (CP): Death of a loved one Behaviors When Feeling Stressed/Fearful: continues to attend church-bible study weekly, excercises at the [facility] 3x per week. adjusting perspective, practicing gratitude, increasing socialization ie senior center activities Techniques to Loop with Loss/Stress/Change: Diversional activities, Exercise Quality of Family Relationships: involved, supportive Do you feel physically threatened by others?: No    07/18/2024    PHQ2-9 Depression Screening   Little interest or pleasure in doing things Several days  Feeling down, depressed, or hopeless Several days  PHQ-2 - Total Score 2  Trouble falling or staying asleep, or sleeping too much Several days (takes trazadone to sleep)  Feeling tired or having little energy Not at all  Poor appetite or overeating  Not at all  Feeling bad about yourself - or that you are a failure or have let yourself or your family down Several days  Trouble concentrating on things, such as reading the newspaper or watching television Several days  Moving or speaking so slowly that other people could have noticed.  Or the opposite - being so fidgety or restless that you have been moving around a lot more than usual Not at all  Thoughts that you would be better off dead, or hurting yourself in some way Not at all  PHQ2-9 Total Score    If you checked  off any problems, how difficult have these problems  made it for you to do your work, take care of things at home, or get along with other people    Depression Interventions/Treatment Medication    There were no vitals filed for this visit.    Medications Reviewed Today     Reviewed by Ermalinda Lenn HERO, LCSW (Social Worker) on 07/18/24 at 2304  Med List Status: <None>   Medication Order Taking? Sig Documenting Provider Last Dose Status Informant  amLODipine  (NORVASC ) 5 MG tablet [id number] Yes Take 1 tablet (5 mg total) by mouth daily. Each morning. Wendee Lynwood HERO, NP  Active   aspirin  81 MG chewable tablet [id number] Yes Chew 81 mg by mouth in the morning. [provider]  Active Self  aspirin  EC 325 MG tablet [id number] Yes Take 1 tablet (325 mg total) by mouth daily.  Patient not taking: Reported on 07/18/2024   Genelle Standing, MD  Active   atorvastatin  (LIPITOR) 80 MG tablet [id number] Yes Take 1 tablet (80 mg total) by mouth daily. Each evening Wendee Lynwood HERO, NP  Active   Berberine Chloride (BERBERINE HCI PO) [id number] Yes Take 1,200 mg by mouth daily. [provider]  Active   CALCIUM -VITAMIN D  PO [id number] Yes Take by mouth. Calcium  600 mg and vitamin d  400 mg [provider]  Active Self  cyclobenzaprine  (FLEXERIL ) 10 MG tablet [id number]  Take 1 tablet (10 mg total) by mouth 3 (three) times daily as needed for muscle spasms.  Patient not taking: Reported on 07/18/2024   Garland Sora, PA-C  Active   docusate sodium (COLACE) 100 MG capsule [id number] Yes Take 100 mg by mouth daily. [provider]  Active Self  enalapril  (VASOTEC ) 20 MG tablet [id number] Yes Take 2 tablets (40 mg total) by mouth daily. Each morning. Wendee Lynwood HERO, NP  Active   Fluocinolone  Acetonide Body 0.01 % OIL [id number] Yes Apply to scalp, face and ears QD to BID Jackquline Sawyer, MD  Active Self  hydrochlorothiazide  (HYDRODIURIL ) 12.5 MG tablet [id number] Yes Take 1 tablet (12.5 mg total) by mouth daily. Each morning. Wendee Lynwood HERO,  NP  Active   Meclizine HCl 25 MG CHEW [id number] Yes Chew 25 mg by mouth daily as needed. [provider]  Active   metFORMIN  (GLUCOPHAGE -XR) 500 MG 24 hr tablet [id number] Yes TAKE 1 TABLET BY MOUTH DAILY  WITH BREAKFAST  Patient taking differently: Take 500 mg by mouth daily with breakfast. Only takes if BG elevated   Wendee Lynwood HERO, NP  Active   Multiple Vitamins-Minerals (MULTIVITAMIN WITH MINERALS) tablet [id number] Yes Take 1 tablet by mouth daily. Centrum silver 50+ [provider]  Active Self  naloxone (NARCAN) nasal spray 4 mg/0.1 mL [id number] Yes Place 1 spray into the nose once. [provider]  Active   ondansetron  (ZOFRAN ) 4 MG tablet [id number] Yes Take 1 tablet (4 mg total) by mouth every 8 (eight) hours as needed for nausea or vomiting. Shuford, Sora, PA-C  Active   oxyCODONE  (OXY IR/ROXICODONE ) 5 MG immediate release tablet [id number] Yes Take 5-10 mg by mouth every 6 (six) hours as needed. [provider]  Active   oxyCODONE  (ROXICODONE ) 5 MG immediate release tablet [id number] Yes Take 1 tablet (5 mg total) by mouth every 4 (four) hours as needed for severe pain (pain score 7-10) or breakthrough pain. Genelle Standing, MD  Active   polyethylene glycol (MIRALAX / GLYCOLAX) 17 g packet [id number] Yes Take 17 g by  mouth daily as needed. [provider]  Active   propranolol (INDERAL) 20 MG tablet [id number] Yes Take 20 mg by mouth daily. Received from [place]  Patient taking differently: Take 20 mg by mouth daily. Received from [place] - take 10 in am and 10 at night   [provider]  Active   sertraline  (ZOLOFT ) 50 MG tablet [id number] Yes Take 1 tablet (50 mg total) by mouth daily. Each evening Wendee Lynwood HERO, NP  Active   sildenafil (VIAGRA) 100 MG tablet [id number] Yes Take 50-100 mg by mouth daily as needed for erectile dysfunction. [provider]  Active Self  Specialty Vitamins Products (PROSTATE PO) [id number] Yes Take 250 mg by  mouth at bedtime. supra beta prostate [provider]  Active Self  tamsulosin  (FLOMAX ) 0.4 MG CAPS capsule [id number]  TAKE 2 CAPSULES BY MOUTH DAILY  Patient taking differently: Take 0.8 mg by mouth daily. States taking 1 in am   Wendee Lynwood HERO, NP  Active   testosterone cypionate (DEPOTESTOSTERONE CYPIONATE) 200 MG/ML injection [id number]  Inject 1 mL into the muscle every 28 (twenty-eight) days. [provider]  Active Self  tobramycin-dexamethasone  [employer]) ophthalmic solution [id number]  Place 1 drop into the left eye daily as needed (eye infection).  Patient not taking: Reported on 07/17/2024   [provider]  Active Self  traZODone  (DESYREL ) 50 MG tablet [id number] Yes TAKE 1 TABLET BY MOUTH AT  BEDTIME AS NEEDED FOR SLEEP  Patient taking differently: Take 50 mg by mouth at bedtime. for sleep   Wendee Lynwood HERO, NP  Active   TURMERIC PO [id number] Yes Take 1 capsule by mouth daily as needed (Knee pain). [provider]  Active Self            Recommendation:   PCP Follow-up Specialty provider follow-up as scheduled Continue Current Plan of Care  Follow Up Plan:   Telephone follow up appointment date/time:  08/14/24 1:30pm   Jasper Hanf Ermalinda HUGHS [place]  [facility], [facility] Licensed Clinical Social Worker  Direct Dial: [phone]

## 2024-07-18 NOTE — Patient Instructions (Signed)
 Visit Information  Thank you for taking time to visit with me today. Please don't hesitate to contact me if I can be of assistance to you before our next scheduled appointment.  Your next care management appointment is by telephone on 08/20/2024 at 1:30 pm  Telephone follow-up in 1 month  Please call the care guide team at [phone] if you need to cancel, schedule, or reschedule an appointment.   Please call the Suicide and Crisis Lifeline: 988 call the [place]  National Suicide Prevention Lifeline: 808-360-6913 or TTY: (934)695-5024 TTY (430) 548-1595) to talk to a trained counselor call 1-800-273-TALK (toll free, 24 hour hotline) go to [facility] [address], [place] [phone]) call 911 if you are experiencing a Mental Health or Behavioral Health Crisis or need someone to talk to.  Nestora Duos, MSN, RN [facility], [facility] RN Care Manager Direct Dial: [phone] Fax: [phone]

## 2024-07-18 NOTE — Patient Instructions (Signed)
 Visit Information  Thank you for taking time to visit with me today. Please don't hesitate to contact me if I can be of assistance to you before our next scheduled appointment.  Your next care management appointment is by telephone on 08/14/24 at 1:30pm    Please call the care guide team at [phone] if you need to cancel, schedule, or reschedule an appointment.   Please call the Suicide and Crisis Lifeline: 988 call the [place]  National Suicide Prevention Lifeline: 3192492993 or TTY: 325 741 4852 TTY 3436949946) to talk to a trained counselor call 1-800-273-TALK (toll free, 24 hour hotline) call 911 if you are experiencing a Mental Health or Behavioral Health Crisis or need someone to talk to.  Ziere Docken, LCSW [place]  [facility], [facility] Licensed Clinical Social Worker  Direct Dial: [phone]

## 2024-07-18 NOTE — Patient Outreach (Signed)
 Complex Care Management   Visit Note  07/18/2024  Name:  James Pearson MRN: [id number] DOB: [birth year]  Situation: Referral received for Complex Care Management related to OA Pain Falls I obtained verbal consent from Patient.  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   Stroke (HCC) 08/10/2011    Assessment: Patient Reported Symptoms:  Cognitive Cognitive/Intellectual Conditions Management [RPT]: None reported or documented in medical history or problem list   Health Maintenance Behaviors: Exercise, Annual physical exam  Neurological [place] Neurological Symptoms/Conditions [RPT]: Called Dr Collie office stating will not see because seeing [facility] and [facility] - reports not seeing [facility] any more, RNCM will notify PCP, reports Botox injections for tremors in hands with some improvement Neurological Management Strategies: Routine screening  HEENT HEENT Symptoms Reported: No symptoms reported HEENT Management Strategies: Routine screening    Cardiovascular Cardiovascular Symptoms Reported: No symptoms reported Does patient have uncontrolled Hypertension?: No Cardiovascular Management Strategies: Routine screening Cardiovascular Comment: yesterday while exercising felt like heart skipped a beat, also feels when Tens unit on, happening for 6-8 mos, certain foods make a difference,  no associated cardiac sx. red flags for ED reviewed  Respiratory Respiratory Symptoms Reported: No symptoms reported    Endocrine Endocrine Symptoms Reported: Hyperglycemia Is patient diabetic?: Yes List most recent blood sugar readings, include date and time of day: reports FBG 119 today - 109 one day last week, took metformin  2x last week and  once this week for feeling high BG (sluggish/dizzy) when checked was 175 - reports metformin  helped    Gastrointestinal Gastrointestinal Symptoms Reported: No symptoms reported Additional Gastrointestinal Details: appetite improved, BM 1-2 day (improved) hydrating throughout day, Miralax and colace Gastrointestinal Management Strategies: Medication therapy    Genitourinary Genitourinary Symptoms Reported: No symptoms reported Additional Genitourinary Details: will switch to only tamsulosin  when supplements run out - works better Genitourinary Management Strategies: Medication therapy  Integumentary Integumentary Symptoms Reported: No symptoms reported Additional Integumentary Details: will contact Derm for refill - likely needs appointment Skin Management Strategies: Routine screening, Medication therapy  Musculoskeletal Musculoskelatal Symptoms Reviewed: Back pain, Joint pain, Muscle pain Other Musculoskeletal Symptoms: continues with exercise, using TENS, Botox injections for hands helping, shoulder pain - follow up next month, knee pain Musculoskeletal Management Strategies: Medication therapy Falls in the past year?: No Number of falls in past year: 1 or less Was there an injury with Fall?: No Fall Risk Category Calculator: 0 Patient Fall Risk Level: Low Fall Risk Patient at Risk for Falls Due to: Orthopedic patient Fall risk Follow up: Falls evaluation completed, Falls prevention discussed  Psychosocial Psychosocial Symptoms Reported: Depression - if selected complete PHQ 2-9 Additional Psychological Details: met with LCSW yesterday     Do you feel physically threatened by others?: No    07/18/2024    PHQ2-9 Depression Screening   Little interest or pleasure in doing things Several days  Feeling down, depressed, or hopeless Several days  PHQ-2 - Total Score 2  Trouble falling or staying asleep, or sleeping too much    Feeling tired or having little energy    Poor appetite or  overeating     Feeling bad about yourself - or  that you are a failure or have let yourself or your family down    Trouble concentrating on things, such as reading the newspaper or watching television    Moving or speaking so slowly that other people could have noticed.  Or the opposite - being so fidgety or restless that you have been moving around a lot more than usual    Thoughts that you would be better off dead, or hurting yourself in some way    PHQ2-9 Total Score    If you checked off any problems, how difficult have these problems made it for you to do your work, take care of things at home, or get along with other people    Depression Interventions/Treatment      There were no vitals filed for this visit.    Medications Reviewed Today     Reviewed by Devra Lands, RN (Registered Nurse) on 07/18/24 at 1445  Med List Status: <None>   Medication Order Taking? Sig Documenting Provider Last Dose Status Informant  amLODipine  (NORVASC ) 5 MG tablet [id number] Yes Take 1 tablet (5 mg total) by mouth daily. Each morning. Wendee Lynwood HERO, NP  Active   aspirin  81 MG chewable tablet [id number] Yes Chew 81 mg by mouth in the morning. [provider]  Active Self  aspirin  EC 325 MG tablet [id number]  Take 1 tablet (325 mg total) by mouth daily.  Patient not taking: Reported on 07/18/2024   Genelle Standing, MD  Active   atorvastatin  (LIPITOR) 80 MG tablet [id number] Yes Take 1 tablet (80 mg total) by mouth daily. Each evening Wendee Lynwood HERO, NP  Active   Berberine Chloride (BERBERINE HCI PO) [id number] Yes Take 1,200 mg by mouth daily. [provider]  Active   CALCIUM -VITAMIN D  PO [id number] Yes Take by mouth. Calcium  600 mg and vitamin d  400 mg [provider]  Active Self  cyclobenzaprine  (FLEXERIL ) 10 MG tablet [id number]  Take 1 tablet (10 mg total) by mouth 3 (three) times daily as needed for muscle spasms.  Patient not taking: Reported on 07/18/2024   Garland Sora,  PA-C  Active   docusate sodium (COLACE) 100 MG capsule [id number] Yes Take 100 mg by mouth daily. [provider]  Active Self  enalapril  (VASOTEC ) 20 MG tablet [id number] Yes Take 2 tablets (40 mg total) by mouth daily. Each morning. Wendee Lynwood HERO, NP  Active   Fluocinolone  Acetonide Body 0.01 % OIL [id number] Yes Apply to scalp, face and ears QD to BID Jackquline Sawyer, MD  Active Self  hydrochlorothiazide  (HYDRODIURIL ) 12.5 MG tablet [id number] Yes Take 1 tablet (12.5 mg total) by mouth daily. Each morning. Wendee Lynwood HERO, NP  Active   Meclizine HCl 25 MG CHEW [id number] Yes Chew 25 mg by mouth daily as needed. [provider]  Active   metFORMIN  (GLUCOPHAGE -XR) 500 MG 24 hr tablet [id number] Yes TAKE 1 TABLET BY MOUTH DAILY  WITH BREAKFAST  Patient taking differently: Take 500 mg by mouth daily with breakfast. Only takes if BG elevated   Wendee Lynwood HERO, NP  Active   Multiple Vitamins-Minerals (MULTIVITAMIN WITH MINERALS) tablet [id number] Yes Take 1 tablet by mouth daily. Centrum silver 50+ [provider]  Active Self  naloxone (NARCAN) nasal spray 4 mg/0.1 mL [id number] Yes Place 1 spray into the nose once. [provider]  Active   ondansetron  (ZOFRAN ) 4 MG tablet [id number] Yes Take 1 tablet (4 mg total) by mouth every 8 (eight) hours as needed for  nausea or vomiting. Shuford, Randine, PA-C  Active   oxyCODONE  (OXY IR/ROXICODONE ) 5 MG immediate release tablet [id number] Yes Take 5-10 mg by mouth every 6 (six) hours as needed. [provider]  Active   oxyCODONE  (ROXICODONE ) 5 MG immediate release tablet [id number]  Take 1 tablet (5 mg total) by mouth every 4 (four) hours as needed for severe pain (pain score 7-10) or breakthrough pain. Genelle Standing, MD  Active   polyethylene glycol (MIRALAX / GLYCOLAX) 17 g packet [id number] Yes Take 17 g by mouth daily as needed. [provider]  Active   propranolol (INDERAL) 20 MG tablet [id number] Yes Take 20 mg by  mouth daily. Received from [place]  Patient taking differently: Take 20 mg by mouth daily. Received from [place] - take 10 in am and 10 at night   [provider]  Active   sertraline  (ZOLOFT ) 50 MG tablet [id number] Yes Take 1 tablet (50 mg total) by mouth daily. Each evening Wendee Lynwood HERO, NP  Active   sildenafil (VIAGRA) 100 MG tablet [id number] Yes Take 50-100 mg by mouth daily as needed for erectile dysfunction. [provider]  Active Self  Specialty Vitamins Products (PROSTATE PO) [id number] Yes Take 250 mg by mouth at bedtime. supra beta prostate [provider]  Active Self  tamsulosin  (FLOMAX ) 0.4 MG CAPS capsule [id number] Yes TAKE 2 CAPSULES BY MOUTH DAILY  Patient taking differently: Take 0.8 mg by mouth daily. States taking 1 in am   Wendee Lynwood HERO, NP  Active   testosterone cypionate (DEPOTESTOSTERONE CYPIONATE) 200 MG/ML injection [id number] Yes Inject 1 mL into the muscle every 28 (twenty-eight) days. [provider]  Active Self  tobramycin-dexamethasone  (TOBRADEX) ophthalmic solution [id number]  Place 1 drop into the left eye daily as needed (eye infection).  Patient not taking: Reported on 07/17/2024   [provider]  Active Self  traZODone  (DESYREL ) 50 MG tablet [id number] Yes TAKE 1 TABLET BY MOUTH AT  BEDTIME AS NEEDED FOR SLEEP  Patient taking differently: Take 50 mg by mouth at bedtime. for sleep   Wendee Lynwood HERO, NP  Active   TURMERIC PO [id number] Yes Take 1 capsule by mouth daily as needed (Knee pain). [provider]  Active Self            Recommendation:   PCP Follow-up Continue Current Plan of Care  Follow Up Plan:   Telephone follow-up in 1 month  Nestora Duos, MSN, RN [facility]  [facility] [place] [facility] RN Care Manager Direct Dial: [phone] Fax: [phone]

## 2024-07-19 ENCOUNTER — Other Ambulatory Visit: Payer: Self-pay

## 2024-07-19 ENCOUNTER — Telehealth

## 2024-07-20 ENCOUNTER — Other Ambulatory Visit (HOSPITAL_COMMUNITY): Payer: Self-pay

## 2024-07-20 ENCOUNTER — Telehealth

## 2024-07-20 ENCOUNTER — Other Ambulatory Visit: Payer: Self-pay

## 2024-07-23 ENCOUNTER — Ambulatory Visit: Admitting: Urology

## 2024-07-23 VITALS — BP 150/76 | HR 65 | Ht 68.0 in | Wt 167.0 lb

## 2024-07-23 DIAGNOSIS — N3941 Urge incontinence: Secondary | ICD-10-CM

## 2024-07-23 MED ORDER — GEMTESA 75 MG PO TABS: 75.0000 mg | ORAL_TABLET | Freq: Every day | ORAL | Status: AC

## 2024-07-23 NOTE — Progress Notes (Signed)
 07/23/2024 4:06 PM   Nancyann Favorite Aug 24, 1945 [id number]  Referring provider: Wendee Lynwood HERO, NP [address] [place],  [place] [address]  Chief Complaint  Patient presents with   Urinary Incontinence    Urge incontinence of urine     HPI: I was consulted to assess the patient's frequency.  He says he has a strong flow.  He does feel empty.  He gets up once at night.  He voids every 30 to 60 minutes but sometimes can hold it for 2 hours.  He is on Flomax  two a day.  Running water  is a trigger  He has had a TIA.  He is continent.  He is on oral hypoglycemics  No history of bladder surgery kidney stones or bladder infection   PMH: Past Medical History:  Diagnosis Date   Arthritis    Diabetes mellitus without complication (HCC)    GERD (gastroesophageal reflux disease)    Hepatitis    Hypertension    Parkinson's disease (HCC)    Positive TB test    as a child   Stroke (HCC) 08/10/2011    Surgical History: Past Surgical History:  Procedure Laterality Date   CHOLECYSTECTOMY     HAND SURGERY     9 subsequent surgeries   REVERSE SHOULDER ARTHROPLASTY Left 01/27/2023   Procedure: REVERSE SHOULDER ARTHROPLASTY;  Surgeon: Melita Drivers, MD;  Location: [facility];  Service: Orthopedics;  Laterality: Left;    rotator cuff surgery Right     Home Medications:  Allergies as of 07/23/2024       Reactions   Omeprazole Anaphylaxis   Gabapentin Other (See Comments)   Unknown   Ibuprofen Nausea And Vomiting   Other reaction(s): Low blood pressure, NAUSEA,VOMITING, upset stomach   Ketorolac Tromethamine    Other reaction(s): Low blood pressure   Meloxicam Nausea And Vomiting   Morphine Nausea And Vomiting   Nsaids Nausea And Vomiting   Oxybutynin Chloride    Other reaction(s): Xerostomia, Dry eyes, Dry skin, Xerostomia, Dry eyes, Dry skin, Xerostomia, Dry eyes, Dry skin        Medication List        Accurate as of July 23, 2024  4:06 PM. If you have  any questions, ask your nurse or doctor.          amLODipine  5 MG tablet Commonly known as: NORVASC  Take 1 tablet (5 mg total) by mouth daily. Each morning.   aspirin  81 MG chewable tablet Chew 81 mg by mouth in the morning.   aspirin  EC 325 MG tablet Take 1 tablet (325 mg total) by mouth daily.   atorvastatin  80 MG tablet Commonly known as: LIPITOR Take 1 tablet (80 mg total) by mouth daily. Each evening   BERBERINE HCI PO Take 1,200 mg by mouth daily.   CALCIUM -VITAMIN D  PO Take by mouth. Calcium  600 mg and vitamin d  400 mg   cyclobenzaprine  10 MG tablet Commonly known as: FLEXERIL  Take 1 tablet (10 mg total) by mouth 3 (three) times daily as needed for muscle spasms.   docusate sodium 100 MG capsule Commonly known as: COLACE Take 100 mg by mouth daily.   enalapril  20 MG tablet Commonly known as: VASOTEC  Take 2 tablets (40 mg total) by mouth daily. Each morning.   Fluocinolone  Acetonide Body 0.01 % Oil Apply to scalp, face and ears QD to BID   hydrochlorothiazide  12.5 MG tablet Commonly known as: HYDRODIURIL  Take 1 tablet (12.5 mg total) by mouth  daily. Each morning.   Meclizine HCl 25 MG Chew Chew 25 mg by mouth daily as needed.   metFORMIN  500 MG 24 hr tablet Commonly known as: GLUCOPHAGE -XR TAKE 1 TABLET BY MOUTH DAILY  WITH BREAKFAST What changed: additional instructions   multivitamin with minerals tablet Take 1 tablet by mouth daily. Centrum silver 50+   naloxone 4 MG/0.1ML Liqd nasal spray kit Commonly known as: NARCAN Place 1 spray into the nose once.   ondansetron  4 MG tablet Commonly known as: Zofran  Take 1 tablet (4 mg total) by mouth every 8 (eight) hours as needed for nausea or vomiting.   oxyCODONE  5 MG immediate release tablet Commonly known as: Oxy IR/ROXICODONE  Take 5-10 mg by mouth every 6 (six) hours as needed.   oxyCODONE  5 MG immediate release tablet Commonly known as: Roxicodone  Take 1 tablet (5 mg total) by mouth every 4  (four) hours as needed for severe pain (pain score 7-10) or breakthrough pain.   polyethylene glycol 17 g packet Commonly known as: MIRALAX / GLYCOLAX Take 17 g by mouth daily as needed.   propranolol 20 MG tablet Commonly known as: INDERAL Take 20 mg by mouth daily. Received from [place] What changed: additional instructions   PROSTATE PO Take 250 mg by mouth at bedtime. supra beta prostate   sertraline  50 MG tablet Commonly known as: ZOLOFT  Take 1 tablet (50 mg total) by mouth daily. Each evening   sildenafil 100 MG tablet Commonly known as: VIAGRA Take 50-100 mg by mouth daily as needed for erectile dysfunction.   tamsulosin  0.4 MG Caps capsule Commonly known as: FLOMAX  TAKE 2 CAPSULES BY MOUTH DAILY What changed: additional instructions   testosterone cypionate 200 MG/ML injection Commonly known as: DEPOTESTOSTERONE CYPIONATE Inject 1 mL into the muscle every 28 (twenty-eight) days.   tobramycin-dexamethasone  ophthalmic solution Commonly known as: TOBRADEX Place 1 drop into the left eye daily as needed (eye infection).   traZODone  50 MG tablet Commonly known as: DESYREL  TAKE 1 TABLET BY MOUTH AT  BEDTIME AS NEEDED FOR SLEEP What changed: when to take this   TURMERIC PO Take 1 capsule by mouth daily as needed (Knee pain).        Allergies: Allergies[1]  Family History: Family History  Problem Relation Age of Onset   Heart disease Mother    Hypertension Mother    Tremor Father    COPD Sister    Heart disease Sister    Hypertension Sister     Social History:  reports that he quit smoking about 33 years ago. His smoking use included cigarettes. He has never used smokeless tobacco. He reports that he does not drink alcohol and does not use drugs.  ROS:                                        Physical Exam: BP (!) 150/76   Pulse 65   Ht 5' 8 (1.727 m)   Wt 75.8 kg   SpO2 96%   BMI 25.39 kg/m   Constitutional:  Alert and  oriented, No acute distress. HEENT: [place] AT, moist mucus membranes.  Trachea midline, no masses. Cardiovascular: No clubbing, cyanosis, or edema. Respiratory: Normal respiratory effort, no increased work of breathing. GI: Abdomen is soft, nontender, nondistended, no abdominal masses GU: No CVA tenderness.  Markedly enlarged over 60 g benign feeling prostate Skin: No rashes, bruises or suspicious lesions.  Lymph: No cervical or inguinal adenopathy. Neurologic: Grossly intact, no focal deficits, moving all 4 extremities. Psychiatric: Normal mood and affect.  Laboratory Data: Lab Results  Component Value Date   WBC 7.4 04/16/2024   HGB 15.4 04/16/2024   HCT 46.5 04/16/2024   MCV 87.4 04/16/2024   PLT 135.0 (L) 04/16/2024    Lab Results  Component Value Date   CREATININE 1.01 04/16/2024    Lab Results  Component Value Date   PSA 1.93 04/16/2024   PSA 1.65 10/28/2022   PSA 1.33 10/09/2021    No results found for: TESTOSTERONE  Lab Results  Component Value Date   HGBA1C 6.9 (H) 04/16/2024    Urinalysis    Component Value Date/Time   COLORURINE YELLOW 05/28/2024 1423   APPEARANCEUR CLEAR 05/28/2024 1423   LABSPEC 1.009 05/28/2024 1423   PHURINE 6.5 05/28/2024 1423   GLUCOSEU NEGATIVE 05/28/2024 1423   HGBUR NEGATIVE 05/28/2024 1423   BILIRUBINUR NEGATIVE 03/19/2022 1727   BILIRUBINUR negative 03/18/2021 1539   KETONESUR NEGATIVE 05/28/2024 1423   PROTEINUR NEGATIVE 05/28/2024 1423   UROBILINOGEN 0.2 03/19/2022 1727   NITRITE NEGATIVE 03/19/2022 1727   LEUKOCYTESUR NEGATIVE 03/19/2022 1727    Pertinent Imaging: Urine reviewed and sent for culture.  Chart reviewed  Assessment & Plan: Patient has frequency especially in the morning triggered by running water .  Flow was good.  The role of a beta 3 agonist discussed.  Return with a postvoid residual and cystoscopy discussed  1. Urge incontinence (Primary)  - Urinalysis, Complete   No follow-ups on file.  Glendia DELENA Elizabeth, MD  [facility] [address], [address] [place], [place] [address] [phone]     [1]  Allergies Allergen Reactions   Omeprazole Anaphylaxis   Gabapentin Other (See Comments)    Unknown   Ibuprofen Nausea And Vomiting    Other reaction(s): Low blood pressure, NAUSEA,VOMITING, upset stomach   Ketorolac Tromethamine     Other reaction(s): Low blood pressure   Meloxicam Nausea And Vomiting   Morphine Nausea And Vomiting   Nsaids Nausea And Vomiting   Oxybutynin Chloride     Other reaction(s): Xerostomia, Dry eyes, Dry skin, Xerostomia, Dry eyes, Dry skin, Xerostomia, Dry eyes, Dry skin

## 2024-07-23 NOTE — Addendum Note (Signed)
 Addended by: ELOUISE SANTA BROCKS on: 07/23/2024 04:20 PM   Modules accepted: Orders

## 2024-07-23 NOTE — Progress Notes (Signed)
 James Pearson                                          MRN: [id number]   07/23/2024   The [facility] Quality Team Specialist reviewed this patient medical record for the purposes of chart review for care gap closure. The following were reviewed: abstraction for care gap closure-kidney health evaluation for diabetes:eGFR  and uACR.    [facility] Quality Team

## 2024-07-24 ENCOUNTER — Encounter: Payer: Self-pay | Admitting: Urology

## 2024-07-24 LAB — URINALYSIS, COMPLETE
Bilirubin, UA: NEGATIVE
Glucose, UA: NEGATIVE
Ketones, UA: NEGATIVE
Leukocytes,UA: NEGATIVE
Nitrite, UA: NEGATIVE
Protein,UA: NEGATIVE
RBC, UA: NEGATIVE
Specific Gravity, UA: 1.02 (ref 1.005–1.030)
Urobilinogen, Ur: 0.2 mg/dL (ref 0.2–1.0)
pH, UA: 6 (ref 5.0–7.5)

## 2024-07-24 LAB — MICROSCOPIC EXAMINATION: Bacteria, UA: NONE SEEN

## 2024-07-26 LAB — CULTURE, URINE COMPREHENSIVE

## 2024-08-01 ENCOUNTER — Other Ambulatory Visit: Payer: Self-pay | Admitting: Nurse Practitioner

## 2024-08-01 DIAGNOSIS — I10 Essential (primary) hypertension: Secondary | ICD-10-CM

## 2024-08-01 DIAGNOSIS — N400 Enlarged prostate without lower urinary tract symptoms: Secondary | ICD-10-CM

## 2024-08-07 ENCOUNTER — Encounter: Payer: Self-pay | Admitting: *Deleted

## 2024-08-07 NOTE — Progress Notes (Signed)
 James Pearson                                          MRN: [id number]   08/07/2024   The [facility] Quality Team Specialist reviewed this patient medical record for the purposes of chart review for care gap closure. The following were reviewed: abstraction for care gap closure-kidney health evaluation for diabetes:eGFR  and uACR.    [facility] Quality Team

## 2024-08-13 ENCOUNTER — Other Ambulatory Visit (HOSPITAL_BASED_OUTPATIENT_CLINIC_OR_DEPARTMENT_OTHER): Payer: Self-pay

## 2024-08-13 ENCOUNTER — Other Ambulatory Visit (HOSPITAL_COMMUNITY): Payer: Self-pay

## 2024-08-13 ENCOUNTER — Other Ambulatory Visit: Payer: Self-pay

## 2024-08-14 ENCOUNTER — Telehealth: Admitting: *Deleted

## 2024-08-14 ENCOUNTER — Other Ambulatory Visit (HOSPITAL_COMMUNITY): Payer: Self-pay

## 2024-08-14 ENCOUNTER — Other Ambulatory Visit: Payer: Self-pay

## 2024-08-14 ENCOUNTER — Ambulatory Visit: Payer: Self-pay | Admitting: Pharmacist

## 2024-08-14 MED ORDER — SERTRALINE HCL 50 MG PO TABS: 50.0000 mg | ORAL_TABLET | Freq: Every day | ORAL | 3 refills | Status: AC

## 2024-08-14 MED ORDER — ATORVASTATIN CALCIUM 80 MG PO TABS: 80.0000 mg | ORAL_TABLET | Freq: Every day | ORAL | 2 refills | Status: AC

## 2024-08-15 ENCOUNTER — Other Ambulatory Visit: Payer: Self-pay

## 2024-08-20 ENCOUNTER — Other Ambulatory Visit: Payer: Self-pay
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
## 2024-08-20 NOTE — Patient Instructions (Signed)
 Visit Information  Thank you for taking time to visit with me today. Please don't hesitate to contact me if I can be of assistance to you before our next scheduled appointment.  Your next care management appointment is by telephone on 09/17/2024 at 3:00 pm  Telephone follow-up in 1 month  Please call the care guide team at [phone] if you need to cancel, schedule, or reschedule an appointment.   Please call the Suicide and Crisis Lifeline: 988 call the [place]  National Suicide Prevention Lifeline: 818-006-3152 or TTY: 386-055-5234 TTY 773-751-2080) to talk to a trained counselor call 1-800-273-TALK (toll free, 24 hour hotline) go to [facility] [address] [place] [phone]) call 911 if you are experiencing a Mental Health or Behavioral Health Crisis or need someone to talk to.  Nestora Duos, MSN, RN [facility], [facility] RN Care Manager Direct Dial: [phone] Fax: [phone]

## 2024-08-21 ENCOUNTER — Other Ambulatory Visit: Payer: Self-pay | Admitting: Pharmacist

## 2024-08-21 DIAGNOSIS — E119 Type 2 diabetes mellitus without complications: Secondary | ICD-10-CM

## 2024-08-21 MED ORDER — BLOOD GLUCOSE MONITORING SUPPL DEVI: 0 refills | Status: DC

## 2024-08-21 MED ORDER — BLOOD GLUCOSE TEST VI STRP: ORAL_STRIP | 3 refills | Status: DC

## 2024-08-21 MED ORDER — LANCETS MISC: 100.0000 | 3 refills | Status: DC

## 2024-08-21 NOTE — Progress Notes (Unsigned)
 Chart Review Reason: Drug information Question - Medication Access  Summary: Recent visit with SW/Care management team and noted he has been unable to afford diabetes testing supplies.   Payor: [employer] MEDICARE / Plan: UHC MEDICARE / Product Type: *No Product type* /   Appears UHC preferred glucometer brand for 2026 = Accu-Chek.  Test claim in cone system for Accu-Check meter = $0    Considerations: New glucometer and testing supplies pended to PCP for consideration   Manuelita FABIENE Kobs, PharmD, JAQUELINE, CPP Clinical Pharmacist Practitioner [facility] Ph: [phone]

## 2024-08-23 ENCOUNTER — Telehealth: Payer: Self-pay

## 2024-08-23 ENCOUNTER — Other Ambulatory Visit

## 2024-08-23 ENCOUNTER — Encounter: Payer: Self-pay | Admitting: Nurse Practitioner

## 2024-08-23 ENCOUNTER — Ambulatory Visit: Admitting: Nurse Practitioner

## 2024-08-23 VITALS — BP 130/74 | HR 68 | Temp 98.1°F | Ht 68.0 in | Wt 173.0 lb

## 2024-08-23 DIAGNOSIS — E119 Type 2 diabetes mellitus without complications: Secondary | ICD-10-CM

## 2024-08-23 DIAGNOSIS — R600 Localized edema: Secondary | ICD-10-CM

## 2024-08-23 NOTE — Patient Outreach (Signed)
 Social Drivers of Health  Community Resource and Care Coordination Visit Note   08/23/2024  Name: James Pearson MRN: [id number] DOB:Jul 15, 1946  Situation: Referral received for [facility] needs assessment and assistance related to Financial Strain . I obtained verbal consent from Patient.  Visit completed with Patient on the phone.   Background:      Assessment:   Goals Addressed             This Visit's Progress    BSW Goals       Current SDOH Barriers:  Financial constraints related to getting a new heater Food and utilities.  Interventions: Referred patient to community resources  Provided patient with information about resources for food and utilities.   Thersia Hoar, BSW, MHA [place]  [facility] Social Worker, Population Health [phone]           Recommendation:   Contact DSS to get eligibility requirements for foodstamps.  Follow Up Plan:   Telephone follow-up 09/07/24 at 3pm  Thersia Hoar, BSW, [facility] [place]  Value Based [facility] Social Worker, Population Health [phone]

## 2024-08-23 NOTE — Telephone Encounter (Signed)
 Copied from CRM [id number]. Topic: Clinical - Prescription Issue >> Aug 23, 2024  3:39 PM Kevelyn M wrote: Reason for CRM: Patient calling in because he did receive a meter to go with the new test strips. The test strips he was don't work for the [employer], current meter that he has. Please advise.  Call back # [phone]

## 2024-08-23 NOTE — Telephone Encounter (Signed)
 Can we find out what meter the patient has

## 2024-08-23 NOTE — Patient Instructions (Signed)
 Visit Information  Thank you for taking time to visit with me today. Please don't hesitate to contact me if I can be of assistance to you before our next scheduled appointment.  Your next care management appointment is by telephone on 09/07/24 at 3pm    Please call the care guide team at [phone] if you need to cancel, schedule, or reschedule an appointment.   Please call the Suicide and Crisis Lifeline: 988 call the [place]  National Suicide Prevention Lifeline: 262 744 5378 or TTY: (252)245-9675 TTY 617 794 0848) to talk to a trained counselor call 1-800-273-TALK (toll free, 24 hour hotline) go to [facility] [address] [place] [phone]) call 911 if you are experiencing a Mental Health or Behavioral Health Crisis or need someone to talk to.  Thersia Hoar, HEDWIG, MHA [place]  [facility] Social Worker, Population Health [phone]

## 2024-08-23 NOTE — Telephone Encounter (Signed)
 Copied from CRM [id number]. Topic: Clinical - Prescription Issue >> Aug 23, 2024  3:39 PM Kevelyn M wrote: Reason for CRM: Patient calling in because he did receive a meter to go with the new test strips. The test strips he was don't work for the [employer], current meter that he has. Please advise.  Call back # [phone] >> Aug 23, 2024  3:53 PM Drema MATSU wrote: Pt called to follow up on request. He stated that he only received test strips and needles. No meter.
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
## 2024-08-24 ENCOUNTER — Telehealth: Payer: Self-pay

## 2024-08-24 ENCOUNTER — Other Ambulatory Visit: Payer: Self-pay | Admitting: Nurse Practitioner

## 2024-08-24 ENCOUNTER — Other Ambulatory Visit (HOSPITAL_COMMUNITY): Payer: Self-pay

## 2024-08-24 DIAGNOSIS — E119 Type 2 diabetes mellitus without complications: Secondary | ICD-10-CM

## 2024-08-24 MED ORDER — BLOOD GLUCOSE TEST VI STRP: 1.0000 | ORAL_STRIP | Freq: Two times a day (BID) | 1 refills | Status: AC

## 2024-08-24 MED ORDER — FREESTYLE LIBRE 3 PLUS SENSOR MISC: 3 refills | Status: DC

## 2024-08-24 NOTE — Telephone Encounter (Signed)
 Will call patient Monday.  Pt will need a new meter for the strips.

## 2024-08-24 NOTE — Telephone Encounter (Signed)
 To help increase the likelihood of successful prior authorization for a continuous glucose monitor, please review the documentation and include evidence of at least two hypoglycemic events (insurers often require glucose values <=54 mg/dL) and/or clear documentation that the patient relies on insulin to adequately control their diabetes. Thank you for your partnership.

## 2024-08-24 NOTE — Telephone Encounter (Signed)
 Called and spoke with patient. Informed him of test strips and CGM sent in to pharmacy. Pt has no questions or concerns.

## 2024-08-24 NOTE — Telephone Encounter (Signed)
 Can we let the patient know that he does not meet the criteria for insurance to pay for a continuous glucose monitor

## 2024-08-24 NOTE — Telephone Encounter (Signed)
 Sent in free style CGM and strips just in case

## 2024-08-24 NOTE — Telephone Encounter (Signed)
 Pt is using the Freestyle libra Light.  Pt complains that he wants to try the continuous monitor because he says his hands shake a lot and it would be easier.

## 2024-08-27 ENCOUNTER — Telehealth: Payer: Self-pay

## 2024-08-27 ENCOUNTER — Telehealth: Payer: Self-pay | Admitting: Urology

## 2024-08-27 NOTE — Telephone Encounter (Signed)
 Patient came into the office and wants to get a prescription of Myrbetriq called into [facility] in [place].

## 2024-08-27 NOTE — Telephone Encounter (Signed)
 Pt called in and would like to either have Oxybutin or Gemtesa  refilled. He said either one worked for him. He was given a gemtesa  trail in December for his frequency issues.

## 2024-08-28 ENCOUNTER — Telehealth: Payer: Self-pay | Admitting: *Deleted

## 2024-08-31 ENCOUNTER — Telehealth: Payer: Self-pay | Admitting: *Deleted

## 2024-08-31 ENCOUNTER — Encounter: Payer: Self-pay | Admitting: *Deleted

## 2024-08-31 NOTE — Patient Instructions (Signed)
 Nancyann Favorite - I am sorry I was unable to reach you today.  I work with Wendee, Lynwood HERO, NP and am calling to support your healthcare needs. Please contact me at [phone] at your earliest convenience. I look forward to speaking with you soon.   Thank you,    Zira Helinski, LCSW [place]  [facility], [facility] Licensed Clinical Social Worker  Direct Dial: [phone]

## 2024-09-03 ENCOUNTER — Ambulatory Visit: Admitting: Urology

## 2024-09-05 ENCOUNTER — Ambulatory Visit (HOSPITAL_BASED_OUTPATIENT_CLINIC_OR_DEPARTMENT_OTHER): Admitting: Orthopaedic Surgery

## 2024-09-05 ENCOUNTER — Telehealth: Payer: Self-pay

## 2024-09-05 NOTE — Telephone Encounter (Signed)
 Copied from CRM #[id number]. Topic: Clinical - Medication Prior Auth >> Sep 05, 2024  3:50 PM Jasmin G wrote: Reason for CRM: [facility] - [place], [place] - [name] needs pre authorization to refill Continuous Glucose Sensor Strips.

## 2024-09-06 MED ORDER — FREESTYLE LIBRE 3 PLUS SENSOR MISC: 3 refills | Status: AC

## 2024-09-06 NOTE — Addendum Note (Signed)
 Addended by: WENDEE LYNWOOD HERO on: 09/06/2024 07:52 AM   Modules accepted: Orders

## 2024-09-07 ENCOUNTER — Telehealth: Payer: Self-pay

## 2024-09-07 ENCOUNTER — Other Ambulatory Visit (HOSPITAL_COMMUNITY): Payer: Self-pay

## 2024-09-07 NOTE — Patient Instructions (Signed)
 Nancyann Favorite - I am sorry I was unable to reach you today for our scheduled appointment. I work with Wendee, Lynwood HERO, NP and am calling to support your healthcare needs. Please contact me at [phone] at your earliest convenience. I look forward to speaking with you soon.   Thank you,  Thersia Hoar, BSW, MHA [place]  [facility] Social Worker, Population Health [phone]

## 2024-09-17 ENCOUNTER — Telehealth: Payer: Self-pay | Admitting: *Deleted

## 2024-09-17 ENCOUNTER — Telehealth

## 2024-09-20 ENCOUNTER — Telehealth

## 2024-10-08 ENCOUNTER — Other Ambulatory Visit: Admitting: Urology

## 2024-11-01 ENCOUNTER — Encounter

## 2024-11-01 ENCOUNTER — Ambulatory Visit (HOSPITAL_COMMUNITY)

## 2024-11-19 ENCOUNTER — Encounter: Admitting: Nurse Practitioner

## 2025-04-08 ENCOUNTER — Ambulatory Visit
